# Patient Record
Sex: Female | Born: 1950 | Race: Black or African American | Hispanic: No | Marital: Single | Smoking: Former smoker
Health system: Southern US, Community
[De-identification: ages and names within clinical notes are randomized; demographics above are authoritative.]

## PROBLEM LIST (undated history)

## (undated) DIAGNOSIS — E039 Hypothyroidism, unspecified: Secondary | ICD-10-CM

## (undated) DIAGNOSIS — K8500 Idiopathic acute pancreatitis without necrosis or infection: Principal | ICD-10-CM

## (undated) DIAGNOSIS — Z720 Tobacco use: Secondary | ICD-10-CM

## (undated) DIAGNOSIS — I10 Essential (primary) hypertension: Secondary | ICD-10-CM

## (undated) DIAGNOSIS — F0390 Unspecified dementia without behavioral disturbance: Secondary | ICD-10-CM

## (undated) DIAGNOSIS — M6530 Trigger finger, unspecified finger: Secondary | ICD-10-CM

## (undated) DIAGNOSIS — K859 Acute pancreatitis without necrosis or infection, unspecified: Secondary | ICD-10-CM

## (undated) DIAGNOSIS — R413 Other amnesia: Secondary | ICD-10-CM

## (undated) DIAGNOSIS — I358 Other nonrheumatic aortic valve disorders: Secondary | ICD-10-CM

## (undated) DIAGNOSIS — J449 Chronic obstructive pulmonary disease, unspecified: Secondary | ICD-10-CM

## (undated) HISTORY — DX: Acute pancreatitis without necrosis or infection, unspecified: K85.90

## (undated) HISTORY — DX: Idiopathic acute pancreatitis without necrosis or infection: K85.00

## (undated) HISTORY — PX: BACK SURGERY: SHX140

---
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
## 1999-02-22 ENCOUNTER — Encounter: Admission: RE | Admit: 1999-02-22 | Discharge: 1999-02-22 | Payer: Self-pay | Admitting: Internal Medicine

## 1999-02-22 ENCOUNTER — Encounter: Payer: Self-pay | Admitting: Internal Medicine

## 1999-08-22 ENCOUNTER — Encounter: Admission: RE | Admit: 1999-08-22 | Discharge: 1999-11-20 | Payer: Self-pay | Admitting: Internal Medicine

## 1999-09-05 ENCOUNTER — Encounter: Admission: RE | Admit: 1999-09-05 | Discharge: 1999-09-05 | Payer: Self-pay | Admitting: *Deleted

## 1999-09-05 ENCOUNTER — Encounter: Payer: Self-pay | Admitting: *Deleted

## 1999-09-18 ENCOUNTER — Ambulatory Visit (HOSPITAL_COMMUNITY): Admission: RE | Admit: 1999-09-18 | Discharge: 1999-09-18 | Payer: Self-pay | Admitting: *Deleted

## 1999-09-18 ENCOUNTER — Encounter: Payer: Self-pay | Admitting: *Deleted

## 2000-02-28 ENCOUNTER — Encounter: Payer: Self-pay | Admitting: Internal Medicine

## 2000-02-28 ENCOUNTER — Encounter: Admission: RE | Admit: 2000-02-28 | Discharge: 2000-02-28 | Payer: Self-pay | Admitting: Internal Medicine

## 2000-11-09 ENCOUNTER — Encounter: Admission: RE | Admit: 2000-11-09 | Discharge: 2000-11-09 | Payer: Self-pay | Admitting: Internal Medicine

## 2000-11-09 ENCOUNTER — Encounter: Payer: Self-pay | Admitting: Internal Medicine

## 2001-01-27 HISTORY — PX: CHOLECYSTECTOMY: SHX55

## 2001-08-31 ENCOUNTER — Encounter: Admission: RE | Admit: 2001-08-31 | Discharge: 2001-08-31 | Payer: Self-pay | Admitting: Internal Medicine

## 2001-08-31 ENCOUNTER — Other Ambulatory Visit: Admission: RE | Admit: 2001-08-31 | Discharge: 2001-08-31 | Payer: Self-pay | Admitting: Obstetrics and Gynecology

## 2001-08-31 ENCOUNTER — Encounter: Payer: Self-pay | Admitting: Internal Medicine

## 2001-12-09 ENCOUNTER — Encounter: Admission: RE | Admit: 2001-12-09 | Discharge: 2001-12-09 | Payer: Self-pay | Admitting: Internal Medicine

## 2001-12-09 ENCOUNTER — Encounter: Payer: Self-pay | Admitting: Internal Medicine

## 2001-12-13 ENCOUNTER — Encounter: Payer: Self-pay | Admitting: Internal Medicine

## 2001-12-13 ENCOUNTER — Encounter: Admission: RE | Admit: 2001-12-13 | Discharge: 2001-12-13 | Payer: Self-pay | Admitting: Internal Medicine

## 2002-01-10 ENCOUNTER — Encounter: Payer: Self-pay | Admitting: General Surgery

## 2002-01-13 ENCOUNTER — Ambulatory Visit (HOSPITAL_COMMUNITY): Admission: RE | Admit: 2002-01-13 | Discharge: 2002-01-14 | Payer: Self-pay | Admitting: General Surgery

## 2002-01-13 ENCOUNTER — Encounter (INDEPENDENT_AMBULATORY_CARE_PROVIDER_SITE_OTHER): Payer: Self-pay | Admitting: *Deleted

## 2002-01-13 ENCOUNTER — Encounter: Payer: Self-pay | Admitting: General Surgery

## 2003-04-11 ENCOUNTER — Encounter: Admission: RE | Admit: 2003-04-11 | Discharge: 2003-04-11 | Payer: Self-pay | Admitting: Internal Medicine

## 2003-04-11 ENCOUNTER — Other Ambulatory Visit: Admission: RE | Admit: 2003-04-11 | Discharge: 2003-04-11 | Payer: Self-pay | Admitting: Obstetrics and Gynecology

## 2003-07-13 ENCOUNTER — Encounter: Admission: RE | Admit: 2003-07-13 | Discharge: 2003-07-13 | Payer: Self-pay | Admitting: Internal Medicine

## 2004-01-28 HISTORY — PX: SPINE SURGERY: SHX786

## 2005-01-14 ENCOUNTER — Inpatient Hospital Stay (HOSPITAL_COMMUNITY): Admission: AD | Admit: 2005-01-14 | Discharge: 2005-01-16 | Payer: Self-pay | Admitting: Neurosurgery

## 2005-05-21 ENCOUNTER — Encounter: Admission: RE | Admit: 2005-05-21 | Discharge: 2005-05-21 | Payer: Self-pay | Admitting: Obstetrics and Gynecology

## 2005-07-04 ENCOUNTER — Ambulatory Visit (HOSPITAL_COMMUNITY): Admission: RE | Admit: 2005-07-04 | Discharge: 2005-07-04 | Payer: Self-pay | Admitting: Gastroenterology

## 2005-07-11 ENCOUNTER — Encounter: Admission: RE | Admit: 2005-07-11 | Discharge: 2005-07-11 | Payer: Self-pay | Admitting: Neurosurgery

## 2005-08-01 ENCOUNTER — Encounter: Admission: RE | Admit: 2005-08-01 | Discharge: 2005-08-01 | Payer: Self-pay | Admitting: Neurosurgery

## 2005-09-26 ENCOUNTER — Encounter: Admission: RE | Admit: 2005-09-26 | Discharge: 2005-09-26 | Payer: Self-pay | Admitting: Neurosurgery

## 2006-09-07 ENCOUNTER — Encounter: Admission: RE | Admit: 2006-09-07 | Discharge: 2006-09-07 | Payer: Self-pay | Admitting: Neurosurgery

## 2006-09-16 ENCOUNTER — Encounter: Admission: RE | Admit: 2006-09-16 | Discharge: 2006-09-16 | Payer: Self-pay | Admitting: Internal Medicine

## 2006-11-06 ENCOUNTER — Emergency Department (HOSPITAL_COMMUNITY): Admission: EM | Admit: 2006-11-06 | Discharge: 2006-11-06 | Payer: Self-pay | Admitting: Emergency Medicine

## 2007-11-05 ENCOUNTER — Encounter: Admission: RE | Admit: 2007-11-05 | Discharge: 2007-11-05 | Payer: Self-pay | Admitting: Internal Medicine

## 2008-01-28 DIAGNOSIS — K859 Acute pancreatitis without necrosis or infection, unspecified: Secondary | ICD-10-CM

## 2008-01-28 HISTORY — DX: Acute pancreatitis without necrosis or infection, unspecified: K85.90

## 2008-08-10 ENCOUNTER — Other Ambulatory Visit (HOSPITAL_COMMUNITY): Admission: RE | Admit: 2008-08-10 | Discharge: 2008-09-01 | Payer: Self-pay | Admitting: Psychiatry

## 2008-08-11 ENCOUNTER — Ambulatory Visit: Payer: Self-pay | Admitting: Psychiatry

## 2008-09-12 ENCOUNTER — Emergency Department (HOSPITAL_COMMUNITY): Admission: EM | Admit: 2008-09-12 | Discharge: 2008-09-12 | Payer: Self-pay | Admitting: Emergency Medicine

## 2008-09-12 ENCOUNTER — Ambulatory Visit: Payer: Self-pay | Admitting: *Deleted

## 2008-09-13 ENCOUNTER — Inpatient Hospital Stay (HOSPITAL_COMMUNITY): Admission: RE | Admit: 2008-09-13 | Discharge: 2008-09-13 | Payer: Self-pay | Admitting: *Deleted

## 2008-11-03 ENCOUNTER — Encounter: Admission: RE | Admit: 2008-11-03 | Discharge: 2008-11-03 | Payer: Self-pay | Admitting: Neurosurgery

## 2009-01-30 ENCOUNTER — Encounter: Admission: RE | Admit: 2009-01-30 | Discharge: 2009-01-30 | Payer: Self-pay | Admitting: Internal Medicine

## 2009-03-03 ENCOUNTER — Inpatient Hospital Stay (HOSPITAL_COMMUNITY): Admission: EM | Admit: 2009-03-03 | Discharge: 2009-03-06 | Payer: Self-pay | Admitting: Emergency Medicine

## 2009-08-28 ENCOUNTER — Encounter: Admission: RE | Admit: 2009-08-28 | Discharge: 2009-08-28 | Payer: Self-pay | Admitting: Internal Medicine

## 2010-02-16 ENCOUNTER — Encounter: Payer: Self-pay | Admitting: Internal Medicine

## 2010-02-27 ENCOUNTER — Encounter: Payer: Self-pay | Admitting: Neurosurgery

## 2010-03-04 ENCOUNTER — Other Ambulatory Visit: Payer: Self-pay | Admitting: Neurosurgery

## 2010-03-04 DIAGNOSIS — M47816 Spondylosis without myelopathy or radiculopathy, lumbar region: Secondary | ICD-10-CM

## 2010-03-05 ENCOUNTER — Ambulatory Visit
Admission: RE | Admit: 2010-03-05 | Discharge: 2010-03-05 | Disposition: A | Discharge status: Home / Self Care | Payer: BC Managed Care – HMO | Source: Ambulatory Visit | Attending: Neurosurgery | Admitting: Neurosurgery

## 2010-03-05 DIAGNOSIS — M47816 Spondylosis without myelopathy or radiculopathy, lumbar region: Secondary | ICD-10-CM

## 2010-03-14 ENCOUNTER — Telehealth: Payer: Self-pay | Admitting: Neurosurgery

## 2010-04-17 LAB — BASIC METABOLIC PANEL
BUN: 7 mg/dL (ref 6–23)
BUN: 7 mg/dL (ref 6–23)
CO2: 22 mEq/L (ref 19–32)
CO2: 22 mEq/L (ref 19–32)
Calcium: 8.8 mg/dL (ref 8.4–10.5)
Calcium: 9.1 mg/dL (ref 8.4–10.5)
Chloride: 104 mEq/L (ref 96–112)
Chloride: 105 mEq/L (ref 96–112)
Creatinine, Ser: 0.75 mg/dL (ref 0.4–1.2)
Creatinine, Ser: 0.77 mg/dL (ref 0.4–1.2)
GFR calc Af Amer: >60 mL/min (ref >60)
Glucose, Bld: 100 mg/dL — ABNORMAL HIGH (ref 70–99)

## 2010-04-17 LAB — URINALYSIS, ROUTINE W REFLEX MICROSCOPIC
Glucose, UA: NEGATIVE mg/dL
Urobilinogen, UA: 0.2 mg/dL (ref 0.0–1.0)
pH: 6 (ref 5.0–8.0)

## 2010-04-17 LAB — COMPREHENSIVE METABOLIC PANEL
Alkaline Phosphatase: 111 U/L (ref 39–117)
BUN: 18 mg/dL (ref 6–23)
BUN: 9 mg/dL (ref 6–23)
CO2: 24 mEq/L (ref 19–32)
CO2: 25 mEq/L (ref 19–32)
Calcium: 9.3 mg/dL (ref 8.4–10.5)
Chloride: 105 mEq/L (ref 96–112)
Creatinine, Ser: 0.61 mg/dL (ref 0.4–1.2)
Creatinine, Ser: 0.8 mg/dL (ref 0.4–1.2)
GFR calc non Af Amer: >60 mL/min (ref >60)
Glucose, Bld: 119 mg/dL — ABNORMAL HIGH (ref 70–99)
Potassium: 3.2 mEq/L — ABNORMAL LOW (ref 3.5–5.1)
Potassium: 3.7 mEq/L (ref 3.5–5.1)
Sodium: 137 mEq/L (ref 135–145)
Total Bilirubin: 0.8 mg/dL (ref 0.3–1.2)

## 2010-04-17 LAB — CBC
HCT: 39.1 % (ref 36.0–46.0)
Hemoglobin: 13.1 g/dL (ref 12.0–15.0)
MCHC: 33.7 g/dL (ref 30.0–36.0)
MCHC: 34 g/dL (ref 30.0–36.0)
MCV: 90.7 fL (ref 78.0–100.0)
MCV: 91.3 fL (ref 78.0–100.0)
MCV: 91.9 fL (ref 78.0–100.0)
MCV: 92.5 fL (ref 78.0–100.0)
Platelets: 155 10*3/uL (ref 150–400)
Platelets: 159 10*3/uL (ref 150–400)
Platelets: 169 10*3/uL (ref 150–400)
RBC: 4.26 MIL/uL (ref 3.87–5.11)
RBC: 4.4 MIL/uL (ref 3.87–5.11)
RDW: 14.1 % (ref 11.5–15.5)
WBC: 5.9 10*3/uL (ref 4.0–10.5)
WBC: 7.3 10*3/uL (ref 4.0–10.5)

## 2010-04-17 LAB — GLUCOSE, CAPILLARY
Glucose-Capillary: 102 mg/dL — ABNORMAL HIGH (ref 70–99)
Glucose-Capillary: 104 mg/dL — ABNORMAL HIGH (ref 70–99)
Glucose-Capillary: 124 mg/dL — ABNORMAL HIGH (ref 70–99)
Glucose-Capillary: 94 mg/dL (ref 70–99)
Glucose-Capillary: 99 mg/dL (ref 70–99)

## 2010-04-17 LAB — LIPID PANEL
Cholesterol: 143 mg/dL (ref 0–200)
LDL Cholesterol: 76 mg/dL (ref 0–99)
Triglycerides: 79 mg/dL (ref <150)
VLDL: 16 mg/dL (ref 0–40)

## 2010-04-17 LAB — DIFFERENTIAL
Basophils Relative: 0 % (ref 0–1)
Lymphocytes Relative: 14 % (ref 12–46)
Monocytes Absolute: 0.6 10*3/uL (ref 0.1–1.0)
Monocytes Relative: 7 % (ref 3–12)
Neutro Abs: 6 10*3/uL (ref 1.7–7.7)

## 2010-04-17 LAB — LIPASE, BLOOD
Lipase: 41 U/L (ref 11–59)
Lipase: 47 U/L (ref 11–59)

## 2010-04-17 LAB — HEMOGLOBIN A1C: Mean Plasma Glucose: 137 mg/dL

## 2010-05-04 LAB — URINALYSIS, ROUTINE W REFLEX MICROSCOPIC
Glucose, UA: NEGATIVE mg/dL
Hgb urine dipstick: NEGATIVE
Specific Gravity, Urine: 1.023 (ref 1.005–1.030)
Urobilinogen, UA: 1 mg/dL (ref 0.0–1.0)
pH: 6 (ref 5.0–8.0)

## 2010-05-04 LAB — DIFFERENTIAL
Basophils Absolute: 0.1 10*3/uL (ref 0.0–0.1)
Basophils Relative: 1 % (ref 0–1)
Lymphocytes Relative: 23 % (ref 12–46)
Neutro Abs: 3.9 10*3/uL (ref 1.7–7.7)
Neutrophils Relative %: 64 % (ref 43–77)

## 2010-05-04 LAB — BASIC METABOLIC PANEL
CO2: 27 mEq/L (ref 19–32)
Calcium: 9.1 mg/dL (ref 8.4–10.5)
Creatinine, Ser: 0.97 mg/dL (ref 0.4–1.2)
GFR calc Af Amer: >60 mL/min (ref >60)
GFR calc non Af Amer: 59 mL/min — ABNORMAL LOW (ref >60)
Glucose, Bld: 101 mg/dL — ABNORMAL HIGH (ref 70–99)
Sodium: 138 mEq/L (ref 135–145)

## 2010-05-04 LAB — RAPID URINE DRUG SCREEN, HOSP PERFORMED
Amphetamines: NOT DETECTED
Barbiturates: NOT DETECTED
Cocaine: NOT DETECTED
Opiates: POSITIVE — AB

## 2010-05-04 LAB — CBC
Hemoglobin: 13.3 g/dL (ref 12.0–15.0)
MCHC: 33.5 g/dL (ref 30.0–36.0)
RBC: 4.32 MIL/uL (ref 3.87–5.11)
RDW: 14.9 % (ref 11.5–15.5)

## 2010-05-04 LAB — GLUCOSE, CAPILLARY

## 2010-05-04 LAB — POCT PREGNANCY, URINE: Preg Test, Ur: NEGATIVE

## 2010-06-14 NOTE — Op Note (Signed)
NAMEMarland Reid  AVIONNA, BOWER                          ACCOUNT NO.:  [id number]   MEDICAL RECORD NO.:  [id number]                   PATIENT TYPE:  OIB   LOCATION:  5733                                 FACILITY:  [facility]   PHYSICIAN:  Angelia Mould. Derrell Lolling, M.D.             DATE OF BIRTH:  Nov 28, 1950   DATE OF PROCEDURE:  01/13/2002  DATE OF DISCHARGE:                                 OPERATIVE REPORT   PREOPERATIVE DIAGNOSES:  1. Chronic cholecystitis with cholelithiasis.  2. Right ovarian cyst.   POSTOPERATIVE DIAGNOSES:  1. Chronic cholecystitis with cholelithiasis.  2. Right ovarian cyst, resolving.   PROCEDURES:  1. Laparoscopic cholecystectomy with intraoperative cholangiogram.  2. Photography of right ovary and fallopian tube.   SURGEON:  Angelia Mould. Derrell Lolling, M.D.   ASSISTANT:  Jimmye Norman, M.D.   OPERATIVE INDICATION:  This is a 60 year old black female who has had  intermittent episodes of epigastric pain for many years and has known that  she has gallstones.  For the past six to eight weeks she has been having  almost daily postprandial epigastric pain radiating to the back.  Initial  lab work showed mild elevation of her alkaline phosphatase, but repeat lab  work showed that all of her liver function tests were normal.  An ultrasound  showed at least one gallstones and a contracted gallbladder.  A CT scan of  the abdomen suggested an area of mild focal pancreatitis in the pancreatic  head and a 4.5 cm right ovarian cyst.  She was brought to the operating room  electively.   OPERATIVE FINDINGS:  The gallbladder was chronically inflamed.  The anatomy  of the cystic duct, cystic artery, and common bile duct were conventional.  The intraoperative cholangiogram was normal, showing normal intrahepatic and  extrahepatic bile ducts, no fixed filling defects, prompt flow of contrast  into the duodenum.  There were a couple of air bubbles that we were able to  elongate and flush through.   There were no filling defects suggesting stone.  The liver looked normal.  The right ovary and fallopian tube were visualized  and photographed, and there was a minimal cyst on the right ovary,  suggesting that the large cyst was resolving.  No other abnormalities were  noted of the stomach, duodenum, small intestine, large intestine, or  peritoneal surfaces.   DESCRIPTION OF PROCEDURE:  Following the induction of general endotracheal  anesthesia, the patient's abdomen was prepped and draped in a sterile  fashion.  Marcaine 0.5% with epinephrine was used as a local infiltration  anesthetic.  A vertically-oriented incision was made in the superior rim of  the umbilicus.  The fascia was incised in the midline and the abdominal  cavity entered under direct vision.  A 10 mm Hasson trocar was inserted and  secured with a pursestring suture of 0 Vicryl.  Pneumoperitoneum was  created.  The video camera was  inserted with visualization and findings as  described above.  A 10 mm trocar was placed in the subxiphoid region and two  5 mm trocars placed in the right midabdomen.   The patient was placed in Trendelenburg position.  The bowel was retracted  out of the way and we visualized the uterus, which looked normal, and then  visualized the right fallopian tube and right ovary and took photographs of  that.  The ovary looked essentially normal.  There was what appeared to be a  very tiny cyst remaining in the ovary.   The patient was then positioned in reverse Trendelenburg position and to the  left.  The gallbladder was elevated.  Adhesions were taken down.  We  dissected out the cystic duct and the cystic artery.  The cystic artery was  secured with metal clips and divided.  A large window was created behind the  cystic duct.  The cystic duct was secured with a metal clip close to the  gallbladder.  A cholangiogram catheter was inserted into the cystic duct.  A  cholangiogram was obtained using  the C-arm.  This showed normal intrahepatic  and extrahepatic bile ducts, prompt flow of contrast into the duodenum, and  no fixed filling defects.  A couple of air bubbles were noted, but they were  flushed through.   The cholangiogram catheter was removed.  The cystic duct was secured with  metal clips and divided.  The gallbladder was dissected from its bed with  electrocautery and removed through the umbilical port.  The operative field  was copiously irrigated.  The irrigation fluid was completely clear.  There  was no bleeding and no bile leak whatsoever at the completion of the case.  The trocars were removed under direct vision, and there was no bleeding from  the trocar sites.  The pneumoperitoneum was released.  The fascia at the  umbilicus was closed with 0 Vicryl sutures.  The skin incision closed with  subcuticular sutures of 4-0 Vicryl and Steri-Strips.  Clean bandages were  placed and the patient taken to the recovery room in stable condition.  Estimated blood loss was about 10 cc.  Complications:  None.  Sponge,  needle, and instrument counts were correct.                                               Angelia Mould. Derrell Lolling, M.D.    HMI/MEDQ  D:  01/13/2002  T:  01/14/2002  Job:  [id number]   cc:   Thora Lance, M.D.  [address]  [place]  [place] [address]  Fax: [phone]   Eliberto Ivory. Rosalio Macadamia, M.D.  [address]  [address]  [place]  [place] [address]  Fax: [phone]

## 2010-06-14 NOTE — Op Note (Signed)
NAMESHELANDA, [name] NO.:  [id number]   MEDICAL RECORD NO.:  [id number]          PATIENT TYPE:  AMB   LOCATION:  SDS                          FACILITY:  [facility]   PHYSICIAN:  Payton Doughty, M.D.      DATE OF BIRTH:  08-25-1950   DATE OF PROCEDURE:  01/14/2005  DATE OF DISCHARGE:                                 OPERATIVE REPORT   PREOPERATIVE DIAGNOSIS:  Spondylosis, L3-4, L4-5.   POSTOPERATIVE DIAGNOSIS:  Spondylosis, L3-4, L4-5.   OPERATION:  L3-4, L4-5 laminotomy and foraminotomy, bilateral.   SURGEON:  Payton Doughty, M.D.   NURSE ASSISTANT:  Shamrock.   DOCTOR ASSISTANT:  Hewitt Shorts, M.D.   ANESTHESIA:  General endotracheal.   PREPARATION:  Sterile Betadine prep and scrub with alcohol wipe.   COMPLICATIONS:  None.   BODY OF TEXT:  This is a 60 year old girl with spondylosis at L3-4 and L4-5.  Taken to the operating room and smoothly anesthetized and intubated, placed  prone on the operating room table.  Following shave, prep and drape in the  usual sterile fashion, the skin was infiltrated with 1% lidocaine and  1:400,000 epinephrine.  The skin was incised from the top of L3 to mid-L5  and the lamina of L3, L4 and the top of L5 were exposed bilaterally in the  subperiosteal plane.  Intraoperative x-ray confirmed correctness of the  level.  Laminotomy and foraminotomy were carried out bilaterally using the  high-speed drill.  This was carried to the top of the ligamentum flavum,  which was removed in retrograde fashion, and this allowed decompression of  the nerve roots.  There was significantly more spondylitic change on the  left than on the right.  Following complete decompression, the wound was  irrigated and hemostasis assured.  The laminotomies and foraminotomies were  covered with Depo-Medrol-soaked fat.  Successive layers of 0 Vicryl, 2-0  Vicryl and 3-0 nylon were used to close.  A Betadine and Telfa dressing was  applied and made  occlusive with OpSite and the patient returned to the  recovery room in good condition.           ______________________________  Payton Doughty, M.D.     MWR/MEDQ  D:  01/14/2005  T:  01/16/2005  Job:  [id number]

## 2010-06-14 NOTE — Discharge Summary (Signed)
NAMETATJANA, TURCOTT NO.:  [id number]   MEDICAL RECORD NO.:  [id number]           PATIENT TYPE:   LOCATION:                                 FACILITY:   PHYSICIAN:  Jasmine Pang, M.D. DATE OF BIRTH:  11/14/1950   DATE OF ADMISSION:  09/12/2008  DATE OF DISCHARGE:  09/13/2008                               DISCHARGE SUMMARY   IDENTIFICATION:  This is a 60 year old single African American female  from [place], who was admitted on a voluntary basis.   HISTORY OF PRESENT ILLNESS:  The patient states her mother died in 06/30/2007.  She was very close to her.  She has been grieving a lot.  Mother had  pancreatic cancer and died quickly (within 4 months).  She had been in  [facility], Dr. Electa Sniff started her on Zoloft.  She has also been going to  hospice for counseling.  She states she made some comments about wanting  to die; however, she denies she wants to die and would never commit  suicide.  She has supportive boyfriend, who feels that she is safe to go  home.  For further admission information, see psychiatric admission  assessment.   PHYSICAL FINDINGS:  The complete physical exam was done in the [facility] ED.   DIAGNOSTIC STUDIES:  BMET was within normal limits.  Urine pregnancy  test was negative.   HOSPITAL COURSE:  Upon admission, the patient was started on Zoloft 50  mg daily, and Wellbutrin XL 300 mg daily, Actos 30 mg daily, and  metformin 500 mg daily, and Zocor 20 mg daily, aspirin 81 mg daily,  Flexeril 10 mg p.o. q.8 h. p.r.n., and hydrocodone 10/325 mg 1-2 tablets  q.8 h. p.r.n. pain.  She was also started on Ambien 10 mg p.o. q.h.s.  p.r.n. insomnia, Xanax 0.5 mg one-half to one tablet p.o. t.i.d. p.r.n.  anxiety, and Allegra 180 mg p.o. daily p.r.n., and Flonase nasal spray 1  spray each nostril b.i.d. p.r.n.  In addition, she was started on  Abilify 2 mg p.o. now then daily.  In individual sessions as already  reported when I first met with her on  September 13, 2008, she denied she  wanted to die and would never commit suicide.  She did not want to stay  in the hospital.  She had support system out of the hospital and  promised she could be safe.  The counselor today called her boyfriend.  He does not believe the patient would harm herself and he has talked to  family and friends that feels the same.  He did state that the patient  is depressed over loss of her mother.  They were extremely close and she  has had a lot of difficulty accepting this loss.  He feels she is crying  out for help and does not know what to do.  He lives with her and can  make sure she is safe,  Safety plan was discussed with him.  He was  agreeable to picking her up for discharge.  The patient discussed  discomfort of being in the hospital setting and was very relieved to  hear she would be discharged.  Followup hospital counseling and other  possible support groups were arranged.  She will also continue at [facility] with Dr. Jamas Lav and her therapist, Almond Lint on  September 14, 2008, at 2 p.m.   DISCHARGE DIAGNOSES:  Axis I:  Major depressive disorder, recurrent,  severe, generalized anxiety disorder.  Axis II:  None.  Axis III:  Diabetes, hypertension, hyperlipidemia.  Axis IV:  Severe, problems with primary support group, loss of mother  recently, other psychosocial problems, burden of psychiatric illness,  burden of medical problems.  Axis V:  Global assessment of functioning was 50 at discharge.  GAF was  45 upon admission.  GAF highest past year was 70-75.   DISCHARGE PLANS:  There were no specific activity level or dietary  restrictions.   POSTHOSPITAL CARE PLANS:  The patient will return to Almond Lint at  [facility] on September 14, 2008, 2 p.m.  She will also return to  [facility] to see Dr. Jamas Lav for medication  management.   DISCHARGE MEDICATIONS:  1. Zoloft 50 mg daily.  2. Wellbutrin XL 300  mg daily.  3. Xanax 0.5 mg one-half to one tablet t.i.d. as needed.  4. Actos 30 mg daily.  5. Metformin 500 mg daily.  6. Zocor 20 mg daily.  7. Allegra as directed.  8. Flonase as directed.  9. Aspirin 81 mg daily.  10.Flexeril 10 mg q.8 h. as needed.  11.Hydrocodone as directed.  12.Maxzide as directed.  13.Abilify 2 mg p.o. daily.      Jasmine Pang, M.D.  Electronically Signed     BHS/MEDQ  D:  09/24/2008  T:  09/25/2008  Job:  [id number]

## 2010-06-14 NOTE — H&P (Signed)
[name], [name] NO.:  [id number]   MEDICAL RECORD NO.:  [id number]          PATIENT TYPE:  OIB   LOCATION:  3040                         FACILITY:  [facility]   PHYSICIAN:  Payton Doughty, M.D.      DATE OF BIRTH:  12-11-1950   DATE OF ADMISSION:  01/14/2005  DATE OF DISCHARGE:                                HISTORY & PHYSICAL   ADMITTING DIAGNOSIS:  Spondylosis L3-4 and L4-5.   SERVICE:  Neurosurgery.   This is a very nice 60 year old right-handed black lady who injured her back  moving furniture in 2004. She has had an injection about a year ago that  helped for a couple of days. She has pain every day in her low back, most of  the time down her left leg, sometimes it is in her right leg. She has been  steady in her hips. She has some degenerative change. The biggest problem is  in her back and her left leg. MR has been obtained that demonstrates  spondylosis at L3-4, L4-5, and L5-S1. It appears to be most stenotic at L3-4  and L4-5, and she is admitted now for decompression bilaterally at those  levels.   MEDICAL HISTORY:  Remarkable for adult-onset diabetes for which she takes  metformin 1000 mg twice a day and Avandia 4 mg a day. She is also on Maxzide  25 mg a day. Other medications:  Zocor 20 mg a day, Allegra, and Flonase.   ALLERGIES:  GENERIC MAXZIDE.   SURGICAL HISTORY:  Cholecystectomy in 2003.   FAMILY HISTORY:  Mom is 34, has lumbar spondylosis. Dad is not given.   SOCIAL HISTORY:  Smokes a half a pack of cigarettes a day, does not drink  alcohol, and works in Clinical biochemist for [employer].   REVIEW OF SYSTEMS:  Remarkable for glasses, hypertension,  hypercholesterolemia, leg weakness, back pain, leg pain, joint pain,  arthritis, and diabetes.   PHYSICAL EXAMINATION:  HEENT:  Within normal limits. She has good range of  motion of the neck.  CHEST:  Clear.  CARDIAC:  Regular rate and rhythm.  ABDOMEN:  Nontender with no  hepatosplenomegaly.  EXTREMITIES:  Without clubbing or cyanosis.  GENITOURINARY:  Deferred.  PERIPHERAL PULSES:  Good.  NEUROLOGIC:  She is awake, alert, and oriented. Cranial nerves are intact.  Motor exam shows 5/5 strength throughout the upper and lower extremities.  Sensory dysesthesia described in the left L4 and L5 distribution. Deep  tendon reflexes are 2 at the knees, 1 at the right ankle, absent at the  left. Straight leg raise is positive on the left.   MRI results have been reviewed above.   CLINICAL IMPRESSION:  Lumbar spondylosis with radicular claudication. The  plan is for bilateral laminotomy and foraminotomy at L3-4 and L4-5. She  understands this is not a fusion and it may not solve all her problems but  it offers her the best opportunity without restricting her range of motion.  The risks and benefits of this have been discussed with her and she wishes  to proceed.  ______________________________  Payton Doughty, M.D.     MWR/MEDQ  D:  01/14/2005  T:  01/15/2005  Job:  [id number]

## 2011-01-30 ENCOUNTER — Ambulatory Visit (HOSPITAL_COMMUNITY)
Admission: RE | Admit: 2011-01-30 | Discharge: 2011-01-30 | Disposition: A | Discharge status: Home / Self Care | Payer: Self-pay | Source: Ambulatory Visit | Attending: Internal Medicine | Admitting: Internal Medicine

## 2011-01-30 DIAGNOSIS — M7989 Other specified soft tissue disorders: Secondary | ICD-10-CM

## 2011-01-30 DIAGNOSIS — R609 Edema, unspecified: Secondary | ICD-10-CM

## 2011-01-30 NOTE — Progress Notes (Signed)
*  PRELIMINARY RESULTS* Left lower extremity venous duplex has been performed.   Left:  No evidence of DVT, superficial thrombosis, or Baker's cyst.   [name] 01/30/2011, 7:21 PM

## 2011-02-02 ENCOUNTER — Emergency Department (HOSPITAL_BASED_OUTPATIENT_CLINIC_OR_DEPARTMENT_OTHER)
Admission: EM | Admit: 2011-02-02 | Discharge: 2011-02-03 | Disposition: A | Discharge status: Home / Self Care | Payer: Self-pay | Attending: Emergency Medicine | Admitting: Emergency Medicine

## 2011-02-02 ENCOUNTER — Encounter: Payer: Self-pay | Admitting: *Deleted

## 2011-02-02 DIAGNOSIS — Z79899 Other long term (current) drug therapy: Secondary | ICD-10-CM | POA: Insufficient documentation

## 2011-02-02 DIAGNOSIS — F17200 Nicotine dependence, unspecified, uncomplicated: Secondary | ICD-10-CM | POA: Insufficient documentation

## 2011-02-02 DIAGNOSIS — R609 Edema, unspecified: Secondary | ICD-10-CM | POA: Insufficient documentation

## 2011-02-02 DIAGNOSIS — R600 Localized edema: Secondary | ICD-10-CM

## 2011-02-02 DIAGNOSIS — I10 Essential (primary) hypertension: Secondary | ICD-10-CM | POA: Insufficient documentation

## 2011-02-02 DIAGNOSIS — E119 Type 2 diabetes mellitus without complications: Secondary | ICD-10-CM | POA: Insufficient documentation

## 2011-02-02 DIAGNOSIS — M25579 Pain in unspecified ankle and joints of unspecified foot: Secondary | ICD-10-CM | POA: Insufficient documentation

## 2011-02-02 HISTORY — DX: Essential (primary) hypertension: I10

## 2011-02-02 LAB — DIFFERENTIAL
Lymphocytes Relative: 43 % (ref 12–46)
Lymphs Abs: 2.2 10*3/uL (ref 0.7–4.0)
Monocytes Relative: 11 % (ref 3–12)
Neutro Abs: 2.3 10*3/uL (ref 1.7–7.7)
Neutrophils Relative %: 44 % (ref 43–77)

## 2011-02-02 LAB — CBC
Hemoglobin: 13.5 g/dL (ref 12.0–15.0)
Platelets: 180 10*3/uL (ref 150–400)
RBC: 4.73 MIL/uL (ref 3.87–5.11)
WBC: 5.3 10*3/uL (ref 4.0–10.5)

## 2011-02-02 NOTE — ED Notes (Signed)
Pt states she startted having left ankle pain and swelling on Friday. Seen at [facility] and sent to the hospital to r/o blood clot "Negative". Sat "better", but today increased pain and swelling. Reddened.

## 2011-02-02 NOTE — ED Notes (Signed)
Blood drawn and sent to lab.

## 2011-02-02 NOTE — ED Provider Notes (Signed)
History   This chart was scribed for Hanley Seamen, MD by Charolett Bumpers . The patient was seen in room MH11/MH11 and the patient's care was started at 11:04pm.  CSN: [id number]  Arrival date & time 02/02/11  2150   First MD Initiated Contact with Patient 02/02/11 2304      Chief Complaint  Patient presents with  . Ankle Pain    (Consider location/radiation/quality/duration/timing/severity/associated sxs/prior treatment) HPI [name] is a 61 y.o. female who presents to the Emergency Department complaining of constant, moderate left ankle pain with associated left leg swelling that started 3 days ago. Patient was seen yesterday at [facility] and sent to the hospital to check for blot clot with a Doppler ultrasound which was negative. Patient said symptoms had gotten better yesterday, but have been worsening today. Patient denies fever, flu like symptoms, SOB, and chest pain. Patient states that otherwise she is not sick.     Past Medical History  Diagnosis Date  . Hypertension   . Diabetes mellitus     Past Surgical History  Procedure Date  . Cholecystectomy   . Back surgery     History reviewed. No pertinent family history.  History  Substance Use Topics  . Smoking status: Current Everyday Smoker  . Smokeless tobacco: Not on file  . Alcohol Use: No    OB History    Grav Para Term Preterm Abortions TAB SAB Ect Mult Living                  Review of Systems A complete 10 system review of systems was obtained and is otherwise negative except as noted in the HPI and PMH.   Allergies  Review of patient's allergies indicates not on file.  Home Medications   Current Outpatient Rx  Name Route Sig Dispense Refill  . ALPRAZOLAM 0.5 MG PO TABS Oral Take 0.5 mg by mouth 3 (three) times daily as needed. For anxiety     . AMLODIPINE BESYLATE 5 MG PO TABS Oral Take 5 mg by mouth daily.      . ASPIRIN EC 81 MG PO TBEC Oral Take 81 mg by mouth daily.      Marland Kitchen CALCIUM  CARBONATE-VITAMIN D 600-400 MG-UNIT PO TABS Oral Take 1 tablet by mouth daily.      . CHLORTHALIDONE 25 MG PO TABS Oral Take 25 mg by mouth daily.      . CYCLOBENZAPRINE HCL 10 MG PO TABS Oral Take 10 mg by mouth 2 (two) times daily.      Marland Kitchen HYDROCODONE-ACETAMINOPHEN 5-325 MG PO TABS Oral Take 2 tablets by mouth 2 (two) times daily.      Marland Kitchen LISINOPRIL 20 MG PO TABS Oral Take 20 mg by mouth daily.      Marland Kitchen METFORMIN HCL 500 MG PO TABS Oral Take 500 mg by mouth 2 (two) times daily with a meal.      . PRAVASTATIN SODIUM 40 MG PO TABS Oral Take 40 mg by mouth daily.        BP 123/83  Pulse 86  Temp(Src) 98.8 F (37.1 C) (Oral)  Resp 20  Ht 5\' 5"  (1.651 m)  Wt 207 lb (93.895 kg)  BMI 34.45 kg/m2  SpO2 100%  Physical Exam  Nursing note and vitals reviewed. Constitutional: She is oriented to person, place, and time. She appears well-developed and well-nourished. No distress.  HENT:  Head: Normocephalic and atraumatic.  Eyes: EOM are normal. Pupils are equal, round,  and reactive to light.  Neck: Neck supple. No tracheal deviation present.  Cardiovascular: Normal rate, regular rhythm and normal heart sounds.  Exam reveals no gallop and no friction rub.   No murmur heard. Pulmonary/Chest: Effort normal and breath sounds normal. No respiratory distress. She has no wheezes. She has no rales.  Abdominal: Soft. She exhibits no distension.  Musculoskeletal: Normal range of motion.       Left leg has edema with erythema and warmth about the ankle. Tenderness to soft tissue. No bony or joint tenderness.   Neurological: She is alert and oriented to person, place, and time. No sensory deficit.  Skin: Skin is warm and dry.  Psychiatric: She has a normal mood and affect. Her behavior is normal.    ED Course  Procedures (including critical care time)  DIAGNOSTIC STUDIES: Oxygen Saturation is 100% on room air, normal by my interpretation.    COORDINATION OF CARE:  11:10pm: Performed an ultrasound  to left leg.     MDM   Nursing notes and vitals signs, including pulse oximetry, reviewed.  Summary of this visit's results, reviewed by myself:  Labs:  Results for orders placed during the hospital encounter of 02/02/11  D-DIMER, QUANTITATIVE      Component Value Range   D-Dimer, Quant 0.24  0.00 - 0.48 (ug/mL-FEU)  CBC      Component Value Range   WBC 5.3  4.0 - 10.5 (K/uL)   RBC 4.73  3.87 - 5.11 (MIL/uL)   Hemoglobin 13.5  12.0 - 15.0 (g/dL)   HCT 11.9  14.7 - 82.9 (%)   MCV 85.2  78.0 - 100.0 (fL)   MCH 28.5  26.0 - 34.0 (pg)   MCHC 33.5  30.0 - 36.0 (g/dL)   RDW 56.2  13.0 - 86.5 (%)   Platelets 180  150 - 400 (K/uL)  DIFFERENTIAL      Component Value Range   Neutrophils Relative 44  43 - 77 (%)   Neutro Abs 2.3  1.7 - 7.7 (K/uL)   Lymphocytes Relative 43  12 - 46 (%)   Lymphs Abs 2.2  0.7 - 4.0 (K/uL)   Monocytes Relative 11  3 - 12 (%)   Monocytes Absolute 0.6  0.1 - 1.0 (K/uL)   Eosinophils Relative 3  0 - 5 (%)   Eosinophils Absolute 0.1  0.0 - 0.7 (K/uL)   Basophils Relative 0  0 - 1 (%)   Basophils Absolute 0.0  0.0 - 0.1 (K/uL)      I personally performed the services described in this documentation, which was scribed in my presence.  The recorded information has been reviewed and considered.  12:27 AM We'll treat for both DVT and cellulitis at this time. The significant edema and lack of severe erythema or warmth makes the diagnosis of cellulitis indefinite period will also obtain a repeat Doppler ultrasound as the first one may of been a false negative.       Hanley Seamen, MD 02/03/11 0030

## 2011-02-03 ENCOUNTER — Other Ambulatory Visit (HOSPITAL_BASED_OUTPATIENT_CLINIC_OR_DEPARTMENT_OTHER): Payer: Self-pay

## 2011-02-03 ENCOUNTER — Ambulatory Visit (HOSPITAL_BASED_OUTPATIENT_CLINIC_OR_DEPARTMENT_OTHER)
Admission: RE | Admit: 2011-02-03 | Discharge: 2011-02-03 | Disposition: A | Discharge status: Home / Self Care | Payer: Self-pay | Source: Ambulatory Visit | Attending: Emergency Medicine | Admitting: Emergency Medicine

## 2011-02-03 DIAGNOSIS — M79609 Pain in unspecified limb: Secondary | ICD-10-CM | POA: Insufficient documentation

## 2011-02-03 DIAGNOSIS — M7989 Other specified soft tissue disorders: Secondary | ICD-10-CM | POA: Insufficient documentation

## 2011-02-03 MED ORDER — CEPHALEXIN 500 MG PO CAPS: 500.0000 mg | ORAL_CAPSULE | Freq: Four times a day (QID) | ORAL | Status: AC

## 2011-02-03 MED ORDER — CEPHALEXIN 500 MG PO CAPS
500.0000 mg | ORAL_CAPSULE | Freq: Four times a day (QID) | ORAL | Status: DC
Start: 2011-02-03 — End: 2011-02-03

## 2011-02-03 MED ORDER — ENOXAPARIN SODIUM 100 MG/ML ~~LOC~~ SOLN
SUBCUTANEOUS | Status: AC
  Filled 2011-02-03: qty 1

## 2011-02-03 MED ORDER — ENOXAPARIN SODIUM 40 MG/0.4ML ~~LOC~~ SOLN
1.0000 mg/kg | Freq: Once | SUBCUTANEOUS | Status: AC
  Administered 2011-02-03: 94 mg via SUBCUTANEOUS
  Filled 2011-02-03: qty 0.4

## 2011-02-03 MED ORDER — CEPHALEXIN 250 MG PO CAPS
500.0000 mg | ORAL_CAPSULE | Freq: Once | ORAL | Status: AC
  Administered 2011-02-03: 500 mg via ORAL
  Filled 2011-02-03: qty 2

## 2011-02-03 NOTE — ED Notes (Signed)
rx x 1 given for keflex- verbalizes understanding to f/u with PCP and come back tomorrow for ultrasound

## 2011-09-10 ENCOUNTER — Ambulatory Visit (INDEPENDENT_AMBULATORY_CARE_PROVIDER_SITE_OTHER): Payer: Self-pay | Admitting: Family Medicine

## 2011-09-10 ENCOUNTER — Encounter: Payer: Self-pay | Admitting: Family Medicine

## 2011-09-10 VITALS — BP 135/86 | HR 85 | Ht 65.0 in | Wt 162.0 lb

## 2011-09-10 DIAGNOSIS — I10 Essential (primary) hypertension: Secondary | ICD-10-CM

## 2011-09-10 DIAGNOSIS — E785 Hyperlipidemia, unspecified: Secondary | ICD-10-CM

## 2011-09-10 DIAGNOSIS — M549 Dorsalgia, unspecified: Secondary | ICD-10-CM

## 2011-09-10 DIAGNOSIS — F329 Major depressive disorder, single episode, unspecified: Secondary | ICD-10-CM

## 2011-09-10 DIAGNOSIS — E1149 Type 2 diabetes mellitus with other diabetic neurological complication: Secondary | ICD-10-CM | POA: Insufficient documentation

## 2011-09-10 DIAGNOSIS — G8929 Other chronic pain: Secondary | ICD-10-CM

## 2011-09-10 DIAGNOSIS — E119 Type 2 diabetes mellitus without complications: Secondary | ICD-10-CM

## 2011-09-10 MED ORDER — SERTRALINE HCL 100 MG PO TABS: 200.0000 mg | ORAL_TABLET | Freq: Every day | ORAL | Status: DC

## 2011-09-10 NOTE — Progress Notes (Signed)
  Subjective:    Patient ID: [name], female    DOB: Oct 05, 1950, 61 y.o.   MRN: [id number]  HPI  61 year old female Presents for new patient visit today. She has a history of chronic back pain, anxiety, depression, hyperlipidemia, hypertension, and hyperglycemia.Her previous PCP is Dr. Kirby Funk. Her psychiatrist is Dr. Jamas Lav and tried psychiatric. Her neurosurgeon who performed back surgery with Dr. Trey Sailors. Patient states today that her greatest concern about her health as her depression. Has been managed by Dr. Waverly Ferrari in the past, but Dr. Waverly Ferrari is leaving her psychiatric practice. The patient is currently taking sertraline 100 mg a day, which she has been taking for Several years. She says that it is not effective. She denies any period in which it was effective in controlling her depressive symptoms. Dr. Raquel James also prescribed Seroquel XR 150 mg each bedtime, but the patient has not taken it secondary to concerns about side effects.The patient states that her depression causes memory loss and decreased appetite. The decreased appetite has resulted in an 80 pound weight loss since 2007/07/16. Her depression started in 2007/07/16 with the death of her mother. Since that time she feels her depression is worsening, in part this is due to losing her job with [employer] in Jul 15, 2009.Since that time she has been unemployed. She has no living siblings or children. She states that her best friend lives in [place] for his name is Cecille Rubin.The patient denies any suicidal ideations or history of suicide attempts. However she was hospitalized in 07/16/2007 showed after the death of her mother for treatment of depression.   Review of Systems  Constitutional: Positive for unexpected weight change.  Musculoskeletal: Positive for back pain.  Psychiatric/Behavioral: Positive for decreased concentration.  All other systems reviewed and are negative.   Past medical history: reviewed past surgeries,  illnesses, and medications and documented in the chart Family medical history: Pancreatic cancer her mother, diabetes in her mother, alcohol abuse in her father Social: the patient is single, unemployed, and lives alone in [place]. 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     Objective:   Physical Exam  Constitutional: She appears well-developed and well-nourished. She appears distressed.  HENT:  Head: Normocephalic and atraumatic.  Right Ear: External ear normal.  Left Ear: External ear normal.  Mouth/Throat: Oropharyngeal exudate present.  Eyes: Conjunctivae and EOM are normal. Pupils are equal, round, and reactive to light.  Skin: She is not diaphoretic.  Psychiatric: Thought content normal. Her affect is blunt. Her speech is delayed. She is slowed. Thought content is not paranoid and not delusional. She expresses no homicidal and no suicidal ideation. She expresses no suicidal plans and no homicidal plans.       Crying throughout the entire exam    BP 135/86  Pulse 85  Ht 5\' 5"  (1.651 m)  Wt 162 lb (73.483 kg)  BMI 26.96 kg/m2  PHQ9 - 24  Greater than 50% of the time spent counseling the patient about depression and treatment options     Assessment & Plan:  61 year old female with uncontrolled major depressive disorder.

## 2011-09-10 NOTE — Assessment & Plan Note (Signed)
This is very poorly controlled. Therefore we will increase her sertraline to 200 mg daily, after using 150 mg daily for one week. We'll also obtain records from her psychiatrist Dr. Raquel James. She'll follow up in 4 weeks or sooner as needed.

## 2011-09-10 NOTE — Patient Instructions (Addendum)
Dear Mrs. Coppinger,   Thank you for coming to clinic today. Please read below regarding the issues that we discussed.   Depression - I think we can definitely improve your treatment by increasing the dosage of sertraline. If this does not serve you well, then we can try the seroquel or another option. I look forward to working through this with you.   Your medications have been sent to the [facility] on [address].   Please follow up in clinic in 4 weeks . Please call earlier if you have any questions or concerns.   Sincerely,   Dr. Clinton Sawyer

## 2011-09-16 ENCOUNTER — Inpatient Hospital Stay (HOSPITAL_COMMUNITY)
Admission: EM | Admit: 2011-09-16 | Discharge: 2011-09-17 | DRG: 392 | Disposition: A | Discharge status: Home / Self Care | Payer: MEDICAID | Attending: Emergency Medicine | Admitting: Emergency Medicine

## 2011-09-16 ENCOUNTER — Encounter (HOSPITAL_COMMUNITY): Payer: Self-pay | Admitting: *Deleted

## 2011-09-16 DIAGNOSIS — K8500 Idiopathic acute pancreatitis without necrosis or infection: Secondary | ICD-10-CM

## 2011-09-16 DIAGNOSIS — K859 Acute pancreatitis without necrosis or infection, unspecified: Secondary | ICD-10-CM

## 2011-09-16 DIAGNOSIS — R110 Nausea: Principal | ICD-10-CM | POA: Diagnosis present

## 2011-09-16 HISTORY — DX: Idiopathic acute pancreatitis without necrosis or infection: K85.00

## 2011-09-16 LAB — URINALYSIS, ROUTINE W REFLEX MICROSCOPIC
Bilirubin Urine: NEGATIVE
Ketones, ur: NEGATIVE mg/dL
Nitrite: NEGATIVE
Specific Gravity, Urine: 1.009 (ref 1.005–1.030)
Urobilinogen, UA: 0.2 mg/dL (ref 0.0–1.0)

## 2011-09-16 LAB — CBC WITH DIFFERENTIAL/PLATELET
Eosinophils Relative: 1 % (ref 0–5)
HCT: 38.1 % (ref 36.0–46.0)
Hemoglobin: 12.8 g/dL (ref 12.0–15.0)
Lymphocytes Relative: 35 % (ref 12–46)
Lymphs Abs: 2.5 10*3/uL (ref 0.7–4.0)
MCH: 30.4 pg (ref 26.0–34.0)
MCV: 90.5 fL (ref 78.0–100.0)
Monocytes Absolute: 0.7 10*3/uL (ref 0.1–1.0)
Monocytes Relative: 10 % (ref 3–12)
Platelets: 198 10*3/uL (ref 150–400)
RBC: 4.21 MIL/uL (ref 3.87–5.11)
WBC: 7.1 10*3/uL (ref 4.0–10.5)

## 2011-09-16 LAB — COMPREHENSIVE METABOLIC PANEL
ALT: 19 U/L (ref 0–35)
Alkaline Phosphatase: 78 U/L (ref 39–117)
BUN: 9 mg/dL (ref 6–23)
CO2: 27 mEq/L (ref 19–32)
Calcium: 10.4 mg/dL (ref 8.4–10.5)
GFR calc Af Amer: >90 mL/min (ref >90)
GFR calc non Af Amer: >90 mL/min (ref >90)
Glucose, Bld: 98 mg/dL (ref 70–99)
Sodium: 138 mEq/L (ref 135–145)

## 2011-09-16 LAB — POCT I-STAT TROPONIN I: Troponin i, poc: 0 ng/mL (ref 0.00–0.08)

## 2011-09-16 MED ORDER — SODIUM CHLORIDE 0.9 % IV SOLN: 1000.0000 mL | INTRAVENOUS | Status: DC

## 2011-09-16 MED ORDER — SODIUM CHLORIDE 0.9 % IV BOLUS (SEPSIS)
1000.0000 mL | Freq: Once | INTRAVENOUS | Status: AC
  Administered 2011-09-16: 1000 mL via INTRAVENOUS

## 2011-09-16 MED ORDER — ONDANSETRON HCL 4 MG/2ML IJ SOLN
4.0000 mg | Freq: Once | INTRAMUSCULAR | Status: AC
  Administered 2011-09-16: 4 mg via INTRAVENOUS
  Filled 2011-09-16: qty 2

## 2011-09-16 MED ORDER — FAMOTIDINE IN NACL 20-0.9 MG/50ML-% IV SOLN
20.0000 mg | Freq: Once | INTRAVENOUS | Status: AC
  Administered 2011-09-16: 20 mg via INTRAVENOUS
  Filled 2011-09-16: qty 50

## 2011-09-16 MED ORDER — GI COCKTAIL ~~LOC~~
30.0000 mL | Freq: Once | ORAL | Status: AC
  Administered 2011-09-16: 30 mL via ORAL
  Filled 2011-09-16: qty 30

## 2011-09-16 MED ORDER — SODIUM CHLORIDE 0.9 % IV SOLN
1000.0000 mL | Freq: Once | INTRAVENOUS | Status: AC
  Administered 2011-09-17: 1000 mL via INTRAVENOUS

## 2011-09-16 MED ORDER — POTASSIUM CHLORIDE CRYS ER 20 MEQ PO TBCR
40.0000 meq | EXTENDED_RELEASE_TABLET | Freq: Once | ORAL | Status: AC
  Administered 2011-09-17: 40 meq via ORAL
  Filled 2011-09-16: qty 2

## 2011-09-16 NOTE — ED Provider Notes (Signed)
History     CSN: [id number]  Arrival date & time 09/16/11  1946   None     Chief Complaint  Patient presents with  . Nausea  . Abdominal Pain    (Consider location/radiation/quality/duration/timing/severity/associated sxs/prior treatment) HPI 61 year old woman, comes in with abdominal pain for the last 24 hours. She reports a history of pancreatitis that was diagnosed several years ago. She describes the pain as bowel, and constant, without any relationship with meals. The pain is mainly in the mid-abdomen and epigastric region, and she does not report radiation of the pain to any other regions of the abdomen. The pain is about 8/10 but by the time I interviewed her, it had reduced to about 3/10. She has nausea, but no vomiting. She denies history of fever or change in bowel habits. Reports history of dark stools but no bloody stool or malena. However, she reports chronic history of constipation with bowel movements every 4 days on average.  She has a history of depression and has recently been under a lot of stress due to loss of her job. She is been eating little and has lost about 40lb over the last 1 year.  Past Medical History  Diagnosis Date  . Hypertension   . Diabetes mellitus   . Pancreatitis 2010    Past Surgical History  Procedure Date  . Cholecystectomy   . Back surgery   . Spine surgery 2006    Dr. Trey Sailors     Family History  Problem Relation Age of Onset  . Cancer Mother   . Alcohol abuse Father     History  Substance Use Topics  . Smoking status: Current Everyday Smoker -- 0.5 packs/day  . Smokeless tobacco: Not on file  . Alcohol Use: No    OB History    Grav Para Term Preterm Abortions TAB SAB Ect Mult Living                  Review of Systems  Constitutional: Negative for appetite change.  HENT: Negative.   Eyes: Negative.   Respiratory: Negative.   Cardiovascular: Negative.   Gastrointestinal: Positive for constipation and abdominal  distention. Negative for nausea, vomiting, diarrhea, blood in stool, anal bleeding and rectal pain.  Genitourinary: Negative.   Musculoskeletal: Negative.   Skin: Negative.   Neurological: Negative for dizziness, seizures, facial asymmetry, speech difficulty and headaches.  Hematological: Negative.   Psychiatric/Behavioral: Negative.     Allergies  Review of patient's allergies indicates no known allergies.  Home Medications   Current Outpatient Rx  Name Route Sig Dispense Refill  . ALPRAZOLAM 0.5 MG PO TABS Oral Take 0.5-1 mg by mouth daily.     Marland Kitchen AMLODIPINE BESYLATE 5 MG PO TABS Oral Take 5 mg by mouth daily.      . ASPIRIN EC 81 MG PO TBEC Oral Take 81 mg by mouth daily.      Marland Kitchen CALCIUM PO Oral Take 1 tablet by mouth daily.    . CYCLOBENZAPRINE HCL 10 MG PO TABS Oral Take 10 mg by mouth daily.     Marland Kitchen HYDROCODONE-ACETAMINOPHEN 10-325 MG PO TABS Oral Take 1-2 tablets by mouth every 8 (eight) hours as needed. For pain    . LISINOPRIL 20 MG PO TABS Oral Take 20 mg by mouth daily.      Marland Kitchen METFORMIN HCL 500 MG PO TABS Oral Take 500 mg by mouth every morning.     . CENTRUM PO CHEW Oral Chew 1  tablet by mouth daily.    Marland Kitchen PRAVASTATIN SODIUM 40 MG PO TABS Oral Take 40 mg by mouth daily.      . SERTRALINE HCL 100 MG PO TABS Oral Take 100 mg by mouth daily.      BP 142/82  Pulse 70  Temp 98.7 F (37.1 C) (Oral)  Resp 18  Ht 5\' 5"  (1.651 m)  Wt 158 lb (71.668 kg)  BMI 26.29 kg/m2  SpO2 98%  Physical Exam  Constitutional: She is oriented to person, place, and time. She appears well-developed and well-nourished. No distress.  HENT:  Head: Normocephalic and atraumatic.  Eyes: Conjunctivae and EOM are normal. Pupils are equal, round, and reactive to light.  Neck: Normal range of motion. Neck supple.  Cardiovascular: Normal rate, regular rhythm, normal heart sounds and intact distal pulses.  Exam reveals no gallop and no friction rub.   No murmur heard. Pulmonary/Chest: Effort normal and  breath sounds normal. No respiratory distress. She has no wheezes. She has no rales. She exhibits no tenderness.  Abdominal: Soft. Bowel sounds are normal. She exhibits no distension and no mass. There is tenderness in the epigastric area and left upper quadrant. There is no rebound, no guarding and negative Murphy's sign. No hernia. Hernia confirmed negative in the ventral area.    Musculoskeletal: Normal range of motion. She exhibits no edema and no tenderness.  Neurological: She is alert and oriented to person, place, and time. She has normal reflexes. She displays normal reflexes. She exhibits normal muscle tone.  Skin: Skin is warm and dry. She is not diaphoretic.  Psychiatric: She has a normal mood and affect.    ED Course  Procedures (including critical care time)  Labs Reviewed  COMPREHENSIVE METABOLIC PANEL - Abnormal; Notable for the following:    Potassium 3.3 (*)     All other components within normal limits  LIPASE, BLOOD - Abnormal; Notable for the following:    Lipase 166 (*)     All other components within normal limits  CBC WITH DIFFERENTIAL  URINALYSIS, ROUTINE W REFLEX MICROSCOPIC  POCT I-STAT TROPONIN I   No results found.      MDM  In this 61 year old woman, with a past medical history of pancreatitis, who presents with me epigastric pain for 24 hours, without any change in bowel habits, but with nausea. The main course and pancreatitis of onset is peptic ulcer disease. Lipase is elevated up to 166 from here baseline of 40-50's. Her K is low at 3.3. [facility] contacted for admission for further evaluation.   Dow Adolph, MD 09/16/11 2343   12:59 AM  Discussed with Dr Despina Hick of Family medicine teaching service and recommended to have the patient discharged and follow up with [facility] tomorrow morning for further evaluation. She will be discharged on Zofran, Tylenol and Potassium.   Dow Adolph, MD 09/19/11 [phone]

## 2011-09-16 NOTE — ED Notes (Signed)
Pt c/o nausea followed by epigastric pain x 2 days.  Pt has hx of pancreatitis but states that this does not feel like it.

## 2011-09-16 NOTE — ED Notes (Signed)
Pt states that since yesterday she has felt nauseous, no vomiting.  Pain in mid abdomen describes as burning.  Denies diarrhea, SOB, CP.

## 2011-09-17 ENCOUNTER — Inpatient Hospital Stay (HOSPITAL_COMMUNITY): Payer: Self-pay

## 2011-09-17 ENCOUNTER — Encounter (HOSPITAL_COMMUNITY): Payer: Self-pay | Admitting: Radiology

## 2011-09-17 MED ORDER — IOHEXOL 300 MG/ML  SOLN
80.0000 mL | Freq: Once | INTRAMUSCULAR | Status: AC | PRN
  Administered 2011-09-17: 80 mL via INTRAVENOUS

## 2011-09-17 MED ORDER — ACETAMINOPHEN 500 MG PO TABS: 500.0000 mg | ORAL_TABLET | Freq: Four times a day (QID) | ORAL | Status: AC | PRN

## 2011-09-17 MED ORDER — ONDANSETRON HCL 4 MG PO TABS: 4.0000 mg | ORAL_TABLET | Freq: Three times a day (TID) | ORAL | Status: AC | PRN

## 2011-09-17 MED ORDER — POTASSIUM CHLORIDE ER 10 MEQ PO TBCR: 10.0000 meq | EXTENDED_RELEASE_TABLET | Freq: Two times a day (BID) | ORAL | Status: DC

## 2011-09-17 NOTE — ED Provider Notes (Signed)
Assuming care of the patient from Dr. Silverio Lay. Pt has clinical impression of pancreatitis, and admission was requested. Family medicine consultant evaluated the patient, and felt that patient can go home, with a f/u in the morning in their clinic. She and i spoke with patient, after patient expressed desire to be discharged to the [facility] medicine doctor, and it appears that patient has had no problems tolerating po.  Her CT results are pending. If the CT shows no gross abnormalities, with stable vitals signs, no problems with po tolerance, and understanding that she needs to be on clear diet with gradual advancement i feel comfortable discharging the patient. Patient expained to return to the ER if her symptoms return. Family medicine doctor to ensure that patient has a f/u in the morning.  Derwood Kaplan, MD 09/17/11 [phone]

## 2011-09-17 NOTE — Progress Notes (Signed)
FPTS BRIEF NOTE  I was asked to evaluate this patient for possible admission from ED for pancreatitis.  Patient reports that she has been having nausea x2 days without vomiting.  Has been able to eat and drink, but decreased 2/2 increased pain with eating.  No other symptoms, including no vomiting, diarrhea, melena, BRBPR, dizziness, fevers/chills, edema, CP, SOB.  Does have some epigastric pain.  Laboratory values show elevated lipase of 166, K 3.3, otherwise totally WNL.  After discussion with pt and EDP, decision was made to let pt d/c home with antinausea medications and f/u in clinic in the AM to ensure she is not worsening.  Crissie Aloi 09/17/2011, 12:48 AM

## 2011-09-17 NOTE — ED Notes (Signed)
Pt has completed PO contrast - CT staff made aware of same

## 2011-09-17 NOTE — ED Notes (Signed)
Transported to CT scan via wheelchair per xray tech

## 2011-09-20 NOTE — ED Provider Notes (Signed)
I have supervised the resident on the management of this patient and agree with the note above. I personally interviewed and examined the patient and my addendum is below.   [name] is a 61 y.o. female hx of pancreatitis here with epigastric pain. Epigastric pain for a day, no radiation. Felt nauseous but no vomiting.   Vitals stable. Exam showed mild epigastric tenderness but no rebound. No RUQ tenderness. Labs showed nl CBC, CMP showed K 3.3. Lipase 166. UA nl.   Patient was diagnosed with mild pancreatitis. She was [facility]. Then, we realized that she has been followed up with Family medicine and the resident called the family medicine physician, Dr. Fara Boros. Dr. Fara Boros felt that given patient is not vomiting, she should follow up outpatient instead of being admitted. Dr. Fara Boros will personally examine the patient in the ED. I signed out to Dr. Rhunette Croft, who followed up the CT ab/pel that was nl and patient was sent home on zofran and pain meds. Patient to follow up the following day.    Richardean Canal, MD 09/20/11 1105

## 2011-09-24 ENCOUNTER — Telehealth: Payer: Self-pay | Admitting: Family Medicine

## 2011-09-24 ENCOUNTER — Ambulatory Visit (INDEPENDENT_AMBULATORY_CARE_PROVIDER_SITE_OTHER): Payer: Self-pay | Admitting: Family Medicine

## 2011-09-24 ENCOUNTER — Encounter: Payer: Self-pay | Admitting: Family Medicine

## 2011-09-24 VITALS — BP 134/84 | HR 70 | Temp 99.1°F | Ht 65.0 in | Wt 162.1 lb

## 2011-09-24 DIAGNOSIS — K859 Acute pancreatitis without necrosis or infection, unspecified: Secondary | ICD-10-CM

## 2011-09-24 LAB — COMPREHENSIVE METABOLIC PANEL
ALT: 18 U/L (ref 0–35)
AST: 22 U/L (ref 0–37)
Alkaline Phosphatase: 66 U/L (ref 39–117)
BUN: 15 mg/dL (ref 6–23)
Creat: 0.75 mg/dL (ref 0.50–1.10)
Total Bilirubin: 0.3 mg/dL (ref 0.3–1.2)

## 2011-09-24 LAB — CBC
HCT: 37.8 % (ref 36.0–46.0)
MCH: 29.8 pg (ref 26.0–34.0)
MCHC: 33.1 g/dL (ref 30.0–36.0)
MCV: 90.2 fL (ref 78.0–100.0)
Platelets: 231 10*3/uL (ref 150–400)
RDW: 13.6 % (ref 11.5–15.5)
WBC: 7.5 10*3/uL (ref 4.0–10.5)

## 2011-09-24 LAB — LIPID PANEL
HDL: 41 mg/dL (ref >39)
Total CHOL/HDL Ratio: 2.9 Ratio
VLDL: 23 mg/dL (ref 0–40)

## 2011-09-24 LAB — LIPASE: Lipase: 303 U/L — ABNORMAL HIGH (ref 0–75)

## 2011-09-24 MED ORDER — HYDROCODONE-ACETAMINOPHEN 10-325 MG PO TABS: 1.0000 | ORAL_TABLET | ORAL | Status: AC | PRN

## 2011-09-24 NOTE — Progress Notes (Signed)
  Subjective:    Patient ID: [name], female    DOB: 1950-04-05, 61 y.o.   MRN: [id number]  HPI # She was diagnosed with acute pancreatitis in the ED last week. It resolved the day following her ED visit, however, returned this morning.  She has had decreased appetite and has not been eating much (spoonfuls at a time) for the past week, although she denied abdominal pain up until this morning.  She is not having problems drinking liquids and has able to drink.  She has not tried any pain medication for the abdominal pain. She takes Vicodin twice a day for chronic back pain but did not take any today.  She denies alcohol use, eating a fatty meal, or starting new medications since last week.   Over the weekend, she went to a family reunion, and she did eat more than usual.  Review of Systems Denies diarrhea/constipation/nausea Denies fevers/chills  Allergies, medication, past medical history reviewed.  Significant for: -Tobacco use--1/2 ppd -Major depression, grief, adjustment disorder--her mother passed away in 2009-2010 from pancreatic cancer, and she is still mourning for her; she is an only child and was very close to her mother -History of pancreatitis: first episode in 2009, then last week, then today -History of cholecystectomy: 1990s    Objective:   Physical Exam GEN: NAD PSYCH: appears depressed; not anxious-appearing HEENT: dry MM ABD: NABS, soft, moderate epigastric tenderness with guarding but no rebound SKIN: 3-4 sec capillary refill    Assessment & Plan:

## 2011-09-24 NOTE — Assessment & Plan Note (Addendum)
She was diagnosed with acute pancreatitis in the ED 08/20 with lipase 166 and CT-abdomen showing signs consistent with acute pancreatitis Her pain improved following her ED visit but returned this morning. She ate more than usual over the weekend (2-3 days ago), but otherwise, the cause for return of her pancreatitis is not clear. She may have advanced her diet too quickly, although she does not seem to eat that much at baseline (she has lost 40 pounds unintentionally; it may be due to her depression and grieving for her mother) -Will check labs: CMET, CBC, lipase, TG>>>WBC WNL, lipase 303 -Patient is able to tolerate liquids. Advised she limit PO intake to clear liquids today and advance slowly as tolerated tomorrow -For pain, will give her an extra Rx for Vicodin. She is under a pain contract with her neurosurgeon Dr. Channing Mutters in [place], [place] (she takes Vicodin for her chronic back pain). We called and notified them that she will receive Rx for 20 tablets due to acute pancreatitis -Follow-up in 2 days or sooner if needed. Given indications to go to ED (refractory or significantly worsening abdominal pain, inability to tolerate PO)

## 2011-09-24 NOTE — Patient Instructions (Addendum)
For the abdominal pain: -Take hydrocodone every 4 hours as needed for the pain -It may make you sleepy and constipated, so do not take if you are sleepy. For constipation, drink plenty of fluids and take a laxative as needed -Try to stay hydrated, drinking some water or other liquid every hour -You may drink liquids today. I would avoid solid food until tomorrow and then only eat bland foods like bread, rice, stocks  Follow-up on Friday

## 2011-09-24 NOTE — Telephone Encounter (Signed)
Pt was seen at ED last week for pancreatitis and it has flared up again and needs to talk with nurse (very teary)

## 2011-09-24 NOTE — Telephone Encounter (Signed)
Patient states she is having much pain in abdomen. No fever. No nausea or vomiting. Consulted with Dr. Gwendolyn Grant and he advises for patient to come to office now. She can be here in 30 minutes.

## 2011-09-30 ENCOUNTER — Ambulatory Visit (INDEPENDENT_AMBULATORY_CARE_PROVIDER_SITE_OTHER): Payer: Self-pay | Admitting: Family Medicine

## 2011-09-30 ENCOUNTER — Encounter: Payer: Self-pay | Admitting: Family Medicine

## 2011-09-30 VITALS — BP 124/79 | HR 75 | Ht 65.0 in | Wt 158.4 lb

## 2011-09-30 DIAGNOSIS — K8500 Idiopathic acute pancreatitis without necrosis or infection: Secondary | ICD-10-CM

## 2011-09-30 DIAGNOSIS — K859 Acute pancreatitis without necrosis or infection, unspecified: Secondary | ICD-10-CM

## 2011-09-30 DIAGNOSIS — F329 Major depressive disorder, single episode, unspecified: Secondary | ICD-10-CM

## 2011-09-30 MED ORDER — MIRTAZAPINE 15 MG PO TABS: 15.0000 mg | ORAL_TABLET | Freq: Every day | ORAL | Status: DC

## 2011-09-30 MED ORDER — SERTRALINE HCL 100 MG PO TABS: 200.0000 mg | ORAL_TABLET | Freq: Every day | ORAL | Status: DC

## 2011-09-30 NOTE — Assessment & Plan Note (Signed)
Uncertain etiology as patient does not drink alcohol, has normal TG's, does not have gall stone, and has had not recent medication changes. It has resolved and CT scan was not concerning for a mass, so I am comfortable with observation at this point.

## 2011-09-30 NOTE — Patient Instructions (Addendum)
Dear Mrs. Transue,   Thank you for coming to clinic today. Please read below regarding the issues that we discussed.   1. Depression - Continue with sertraline 200 mg a day and start the remeron/mirtazepine 15mg  (1 pill) at night. Only one pill of the alprazolam since it will make you tired as well.   2. Pancreatitis - We will really close eye on this. I don't expect it to come back soon, but if it does we will take care of you.   Please follow up in clinic in 1 month. Please call earlier if you have any questions or concerns.   Sincerely,   Dr. Clinton Sawyer

## 2011-09-30 NOTE — Assessment & Plan Note (Signed)
PHQ-9 shows moderate depression. However, she is tearful and perseverates on her mother's death and job loss and repeats details to me about it that she has shared multiple times. Given her concomitant sleeping disorder, for which she takes alprazolam QHS PRN, I think the addition of 15 mg of mirtazapine QHS would be appropriate. She is will to try this. I also gave her the contact information for Dr. Pascal Lux to set up an appointment. My next step in treatment would likely be to taper sertraline and start Effexor to see if an SNRI was more effective.

## 2011-09-30 NOTE — Progress Notes (Signed)
  Subjective:    Patient ID: [name], female    DOB: 24-Feb-1950, 61 y.o.   MRN: [id number]  HPI  61 year old F who presents for follow up of recent pancreatitis and major depression.   1. Pancreatitis - She was originally diagnosed in the ED on 8/21. She was given IV fluids, IV pain medication and had a CT scan that demonstrated inflammation around the head of the pancreas. She was discharged. On 8/28, the patient had a recurrence of pain and presented to the [facility] where she was evaluated by Dr. Madolyn Frieze. She was given ondansetron and PO pain medication and appropriate for outpatient management. On 8/28, her lipase was 303, but all other labs including her WBC and Triglycerides were normal. The patient has returned to a normal diet since last week and denies pain today. She also denies nausea, vomiting, and acholic stools. She is still taking daily hydrocodone for her back pain, but does not need any additional medication for abdominal pain. She denies any new medication changes, and she does not drink alcohol. She has a history of pancreatitis x 1 in July 17, 2009. It is also noteworthy that she had a laparascopic cholecystectomy in 2001/07/17.   2. Depression - Severe depression related to death of her mother in 07/17/08 and loss of her job in 07/17/2009 at [employer]. We had an extensive talk about this on 8/14, and she repeated most of the details today. On 8/14, we decided to increase her Sertraline to 200 mg daily, because she will not longer be going to [facility] since she lacks insurance and her physician, Dr. Raquel James, retired. She has been taking 200 mg of sertraline daily and denies any improvement in mood. She also denies any worsening of mood. She reports a history of counseling through [facility]   PMH: pancreatitis, idiopathic - 2009/07/17 PSH: cholecystectomy 07/17/01  Review of Systems  All other systems reviewed and are negative.       Objective:   Physical Exam  BP 124/79  Pulse 75  Ht  5\' 5"  (1.651 m)  Wt 158 lb 6.4 oz (71.85 kg)  BMI 26.36 kg/m2 Gen: alert, oriented, middle aged AAF, non ill appearing Cardiac: RRR, no murmurs Lungs: CTA-B Abdomen: NDNT, NABS, no guarding Psych: normal thought content, no SI/HI, tearful while completing PHQ-9  PHQ-9: 12 (moderated depression)      Assessment & Plan:  61 y.o. F with resolved idiopathic pancreatitis and stable, but resistant depression.

## 2011-10-01 NOTE — Addendum Note (Signed)
Addended by: Lunabella Badgett V on: 10/01/2011 09:46 PM   Modules accepted: Level of Service  

## 2011-10-06 ENCOUNTER — Telehealth: Payer: Self-pay | Admitting: Family Medicine

## 2011-10-06 ENCOUNTER — Telehealth: Payer: Self-pay | Admitting: Psychology

## 2011-10-06 NOTE — Telephone Encounter (Signed)
Is asking about meds that she is taking for depression - he gave her a new one but has a question about what she has already been taking.

## 2011-10-06 NOTE — Telephone Encounter (Signed)
Patient wants to make sure MD wants her to take alprazolam , remeron  and zoloft . She was prescribed Alprazolam and Zoloft by Dr. Jamas Lav. Advised will send message to MD.

## 2011-10-06 NOTE — Telephone Encounter (Signed)
Patient called to schedule appointment per recommendation by Dr. Clinton Sawyer.  Reviewed note.  Scheduled for first available which was 9/16 at 10:00.  I told her the following: -  If she is unable to make the appointment she needs to call me. -  If she misses the appointment without a phone call, I won't be able to schedule her back in my clinic. She voiced an understanding and was able to repeat the appointment date and time back to me.

## 2011-10-06 NOTE — Telephone Encounter (Signed)
Patient called and confirmed that she should be taking Sertraline 200 mg daily, Mirtazepine 15 mg QHS, and Alprazolam 0.5 mg QHS PRN for sleep. She was able to repeat back to me the regimen. I also asked her to reference her AVS from the previous visit which has the same instructions written out.

## 2011-10-13 ENCOUNTER — Ambulatory Visit (INDEPENDENT_AMBULATORY_CARE_PROVIDER_SITE_OTHER): Payer: Self-pay | Admitting: Psychology

## 2011-10-13 DIAGNOSIS — F329 Major depressive disorder, single episode, unspecified: Secondary | ICD-10-CM

## 2011-10-13 NOTE — Patient Instructions (Addendum)
Please think about the information below and call me when you reach a decision:  [phone].   We talked about your grief and depression.  I asked you to think if you would be ready for a therapy experience that might prove a little more direct than the two experiences you have had in the past - in hopes that doing something different might be helpful.  I would likely challenge you on some of the beliefs that you have regarding your mother's death and the way you are currently living. With regards to your medication.  Zoloft is not a medicine that should be taken just a few times a week.  You need to take it daily and if you aren't interested in that, you should stop it all together because it can not be helpful if you are only taking it a couple of times a week.  Zoloft is a good medicine for depression and I think we both agree that you are depressed.  Taking mediation is ABSOLUTELY a good strategy for helping you feel better.  Therapy is good too and the combination of both of them would likely be best. AVOIDANCE is the number one way people usually deal with fear or discomfort.  If makes sense you want to avoid dealing with your sadness and grief.  It also will keep you in this place of sadness and grief.  You get to decide.

## 2011-10-13 NOTE — Assessment & Plan Note (Addendum)
[name] is neatly groomed and appropriately dressed.  She maintains good eye contact and is cooperative and attentive.  Speech is normal in tone, rate and rhythm.  Mood is sad with a consistent affect.  Thought process is circumstantial.  She needs to be redirected often.  Denied current suicidal or homicidal ideation.  Does not appear to be responding to any internal stimuli.  Judgment and insight are average.  Likely meets the criteria for a major depressive episode.  Did not assess symptomatology specifically but can count off difficulty with appetite, energy, mood and interest / pleasure.  She also seems to experience a significant amount of guilt which seems to go beyond grief.  Her significant other is tired of seeing her so sad and her not wanting to do anything.  She does seem especially motivated to work.  I think the structure would likely prove useful.    Grief seems to be the precipitating event and a major barrier to better management of her mood.  When asked the importance of managing her grief better, she rated it a 7.  In terms of confidence, she was originally a 3-4 but upon further questioning, she reported she has NO confidence that she will be able to better manage her grief over her mother's death.  She has too many regrets.  Seems to engage in some magical thinking around her mother's health and subsequent death.  Engages in a lot of "If...then..." that is probably not accurate and certainly not helpful.    She has not been treating her depression with medication in a recommended way.  She was under the impression that Sertraline was making her sleepy so she was taking it intermittently at best.  Provided some education around this without getting to into what she "should do."  She, of her own accord, stated that she will start taking the Sertraline daily.  She plans to hold the Remeron for now.  See patient instructions for further plan.  Will forward note to Dr. Clinton Sawyer for  collaborative care.

## 2011-10-13 NOTE — Progress Notes (Signed)
[name] presented for an initial psychological assessment.  A client information sheet detailing the Behavioral Medicine Service was provided.  The patient voiced an understanding of what was detailed on this sheet including the issue of confidentiality and the limits thereof.  Presenting Problem: [name] presents for depression and grief.  She denies significant mood disturbance until Jun 12, 2007 when her mother died of pancreatic cancer.  She believes that her mother's healthcare was mismanaged.  Additionally, [name] thinks that if she had been involved earlier, she might have been able to intervene in a way that would have caught the cancer earlier.  Her mother was 79 years old when she died in [facility].  Hospice was involved.    Relevant Medical History: Medical record reviewed including problem list and medications.  Of note, [name] has had two bouts of pancreatitis (it is not on her problem list for some reason).  She also has had back surgery and reports that she is in need of another.  Medications reviewed.  Relevant Psychiatric / Psychological History: [name] went to individual grief counseling through Hospice for about six months but did not report benefit.  She was getting both psychiatric treatment and therapy through [facility] after her mom died.  [name] reports she was hospitalized for suicidal ideation at this time and that this was a traumatic experience for her.  She reports expressing a wish to be dead but had no intention of doing anything to harm herself.  She was hospitalized nonetheless and did not benefit from the experience.  She is currently prescribed Sertraline 200 mg, Remeron 15 mg qhs and Alprazolam 0.5 mg as needed.  She says she took one dose of the Remeron and takes about two doses of the Sertraline weekly.  She takes the Alprazolam for anxiety and also when her pain is bad because it helps to relax her.    She was prescribed Seroquel in the past but read that you can  micro-sleep on it and thinks that could be dangerous.    Family History: Parents were separated when she was in junior high.  Reports that father was an alcoholic.  He was murdered (not sure how hold she was at the time).  [name] had one daughter at age 53 and gave her up for adoption.  She is okay with the decision and has thought about seeking her out but has mixed feelings.  [name] was never married.  She is currently in a long-term relationship with Windy Fast who is a retired Tree surgeon.  They live together in [place].  Her best friend since the 8th grade, Cecille Rubin, lives in the [place] across the hall from her.  She was also close to her only maternal aunt and cousin.    History of Abuse: Did not assess.  Education / Occupation: Did not assess education.  Worked for [employer] for 24 years.  Started in [place], [place] and moved to [place] when that facility closed.  In 06-11-2009, shifted people to working at home.  She took at package instead because working from home was not a good option.  Has been looking for work (more because she wants to work rather than financial reasons) but has been unable to find anything.  She thinks this is because of her age.  She is attending the [employer] job fair this week.    Substance Use: Did not assess today.

## 2011-10-15 ENCOUNTER — Ambulatory Visit: Payer: Self-pay | Admitting: Family Medicine

## 2011-10-29 ENCOUNTER — Encounter: Payer: Self-pay | Admitting: Family Medicine

## 2011-10-29 DIAGNOSIS — K8500 Idiopathic acute pancreatitis without necrosis or infection: Secondary | ICD-10-CM | POA: Insufficient documentation

## 2011-10-30 ENCOUNTER — Ambulatory Visit (INDEPENDENT_AMBULATORY_CARE_PROVIDER_SITE_OTHER): Payer: Self-pay | Admitting: Family Medicine

## 2011-10-30 ENCOUNTER — Encounter: Payer: Self-pay | Admitting: Family Medicine

## 2011-10-30 VITALS — BP 145/88 | HR 74 | Ht 65.0 in | Wt 159.4 lb

## 2011-10-30 DIAGNOSIS — Z1211 Encounter for screening for malignant neoplasm of colon: Secondary | ICD-10-CM

## 2011-10-30 DIAGNOSIS — E785 Hyperlipidemia, unspecified: Secondary | ICD-10-CM

## 2011-10-30 DIAGNOSIS — F329 Major depressive disorder, single episode, unspecified: Secondary | ICD-10-CM

## 2011-10-30 DIAGNOSIS — E119 Type 2 diabetes mellitus without complications: Secondary | ICD-10-CM

## 2011-10-30 DIAGNOSIS — R413 Other amnesia: Secondary | ICD-10-CM

## 2011-10-30 NOTE — Patient Instructions (Addendum)
Dear [name],   Thank you for coming to clinic today. Please read below regarding the issues that we discussed.   1. Depression - Keep taking the Zoloft 100 mg every day. We will see you back in one month to reassess how you are feeling.   2. Memory Problems - I believe that this is related to your depression and will improve as your depression improves.   3. Colon Cancer Screening - Please use the stool cards at home the day before your next visit.   4. Breast Cancer Screening - I suggest getting a mammogram now. If you cannot afford it, we have a scholarship that you can apply for. The nurse will give you information about this.   Please follow up in clinic in 1 month. Please call earlier if you have any questions or concerns.   Sincerely,   Dr. Clinton Sawyer

## 2011-10-30 NOTE — Progress Notes (Signed)
  Subjective:    Patient ID: [name], female    DOB: 27-Apr-1950, 61 y.o.   MRN: [id number]  HPI  61 year old female with hypertension, type 2 diabetes, depression, and chronic lower back pain who presents for followup.  Depression: The patient has severe depression previously noted in her last 2 clinic notes. Since she last saw me the patient saw Dr. Shelby Mattocks, psychologist, for evaluation. During that time it was revealed that the patient was not taking her sertraline as prescribed. He was prescribed 200 mg daily. The patient was taking 100 mg every few days. She felt as it was making her fatigued and did not want to take it. Moreover she was prescribed mirtazapine 15 mg each bedtime, and she was not taking this at all. Mrs. Crockett notes that she is now taking sertraline 100 mg each morning. She does not want to stay tonight, because she takes hydrocodone and Flexeril for back pain at night which makes her fatigued. She enjoyed her meeting with Dr. Pascal Lux, but she currently does not have another counseling session scheduled.  Memory loss: the patient has been several times in the past but she has poor memory. She often says things down in her home and forget where she put them shortly thereafter. She believes this started in Jun 30, 2007 after the death of her mother which brought her depression. This is the most bothersome symptoms to her.  Health maintenance: the patient does not remember when she last had a colonoscopy. She also is not room when she last had a mammogram.   Review of Systems Positive for back pain and fatigue; otherwise negative at less than history of present illness    Objective:   Physical Exam  BP 145/88  Pulse 74  Ht 5\' 5"  (1.651 m)  Wt 159 lb 6.4 oz (72.303 kg)  BMI 26.53 kg/m2 Gen.: afterward female, pleasant and conversant, non-ill and nondistressed Psych: smiles frequently but did exhibit mood lability when talking about her memory loss and cried during the  interview      Assessment & Plan:  61 year old female with severe depression and medical noncompliance which is preventing her from having any improvement in her mood.

## 2011-10-31 DIAGNOSIS — R413 Other amnesia: Secondary | ICD-10-CM | POA: Insufficient documentation

## 2011-10-31 MED ORDER — SERTRALINE HCL 100 MG PO TABS: 100.0000 mg | ORAL_TABLET | Freq: Every day | ORAL | Status: DC

## 2011-10-31 NOTE — Assessment & Plan Note (Signed)
Lipid Panel     Component Value Date/Time   CHOL 120 09/24/2011 1623   TRIG 116 09/24/2011 1623   HDL 41 09/24/2011 1623   CHOLHDL 2.9 09/24/2011 1623   VLDL 23 09/24/2011 1623   LDLCALC 56 09/24/2011 1623   Well-controlled. Recheck August 2014. Continue Pravachol 40 mg daily.

## 2011-10-31 NOTE — Assessment & Plan Note (Addendum)
Patient forthcoming with her desire not to take numerous medications. Therefore we will try only sertraline 100 mg daily. She was encouraged to use every day for at least a month in order to fairly judge its efficacy. She was also encouraged to follow with Dr. Pascal Lux.

## 2011-10-31 NOTE — Assessment & Plan Note (Addendum)
Lab Results  Component Value Date   HGBA1C 6.0 09/24/2011   Diabetes well controlled on current metformin regimen. No changes at this time. followup in December.

## 2011-10-31 NOTE — Assessment & Plan Note (Signed)
This is likely a sequelae of years her major depressive disorder. The differential also includes early-onset dementia. If this does not seem to improve with depression treatment then obtained a Mini-Mental status exam.

## 2011-11-03 ENCOUNTER — Other Ambulatory Visit: Payer: Self-pay | Admitting: Family Medicine

## 2011-11-03 ENCOUNTER — Ambulatory Visit (INDEPENDENT_AMBULATORY_CARE_PROVIDER_SITE_OTHER): Payer: Self-pay | Admitting: *Deleted

## 2011-11-03 DIAGNOSIS — Z23 Encounter for immunization: Secondary | ICD-10-CM

## 2011-11-03 DIAGNOSIS — Z1231 Encounter for screening mammogram for malignant neoplasm of breast: Secondary | ICD-10-CM

## 2011-11-14 ENCOUNTER — Ambulatory Visit
Admission: RE | Admit: 2011-11-14 | Discharge: 2011-11-14 | Disposition: A | Discharge status: Home / Self Care | Payer: Self-pay | Source: Ambulatory Visit | Attending: Family Medicine | Admitting: Family Medicine

## 2011-11-14 ENCOUNTER — Other Ambulatory Visit: Payer: Self-pay | Admitting: Family Medicine

## 2011-11-14 DIAGNOSIS — Z1231 Encounter for screening mammogram for malignant neoplasm of breast: Secondary | ICD-10-CM

## 2011-11-17 ENCOUNTER — Ambulatory Visit
Admission: RE | Admit: 2011-11-17 | Discharge: 2011-11-17 | Disposition: A | Discharge status: Home / Self Care | Payer: Self-pay | Source: Ambulatory Visit | Attending: Family Medicine | Admitting: Family Medicine

## 2011-11-17 DIAGNOSIS — Z1231 Encounter for screening mammogram for malignant neoplasm of breast: Secondary | ICD-10-CM

## 2011-12-16 LAB — HEMOCCULT GUIAC POC 1CARD (OFFICE)

## 2011-12-16 NOTE — Addendum Note (Signed)
Addended by: [place], Lonza Shimabukuro on: 12/16/2011 04:50 PM   Modules accepted: Orders

## 2011-12-19 ENCOUNTER — Encounter: Payer: Self-pay | Admitting: Family Medicine

## 2011-12-19 ENCOUNTER — Ambulatory Visit (INDEPENDENT_AMBULATORY_CARE_PROVIDER_SITE_OTHER): Payer: Self-pay | Admitting: Family Medicine

## 2011-12-19 VITALS — BP 152/82 | HR 70 | Temp 99.4°F | Ht 65.0 in | Wt 158.0 lb

## 2011-12-19 DIAGNOSIS — Z129 Encounter for screening for malignant neoplasm, site unspecified: Secondary | ICD-10-CM

## 2011-12-19 DIAGNOSIS — F329 Major depressive disorder, single episode, unspecified: Secondary | ICD-10-CM

## 2011-12-19 DIAGNOSIS — R634 Abnormal weight loss: Secondary | ICD-10-CM

## 2011-12-19 DIAGNOSIS — I10 Essential (primary) hypertension: Secondary | ICD-10-CM

## 2011-12-19 DIAGNOSIS — Z23 Encounter for immunization: Secondary | ICD-10-CM

## 2011-12-19 DIAGNOSIS — Z1211 Encounter for screening for malignant neoplasm of colon: Secondary | ICD-10-CM

## 2011-12-19 NOTE — Progress Notes (Signed)
  Subjective:    Patient ID: [name], female    DOB: Jan 02, 1951, 61 y.o.   MRN: [id number]  HPI  61 year old F w/ depression, HTN, and type II DM with unintentional weight loss.   1. Hypertension  Home BP monitoring:  BP Readings from Last 3 Encounters:  12/19/11 152/82  10/30/11 145/88  09/30/11 124/79    Prescribed meds: amlodipine 5 mg, lisinopril 20 mg  Hypertension ROS: not taking medications regularly as instructed, no medication side effects noted, no TIA's, no chest pain on exertion, no dyspnea on exertion and no swelling of ankles  2. Nutrition - Patient with unintentional weight loss of several years duration. She approximates 85 pounds and relates it to depression, decreased appetite, and having a more active lifestyle. She is taking a multivitamin. She eats several serving of fruits and vegetable daily.   3. Colorectal Cancer Screening - The patient recently tried the heme-occult stool cards. However, she provided an unsatisfactory sample. She denies and rectal bleeding or melena. She is willing to try this again.   4. Depression - The patient was recently evaluated by Dr. Shelby Mattocks, PhD psychologist for persistent depression related to her mother's death. The patient has not returned to see Dr. Pascal Lux, because she has been busy trying to get a job. She would like to return to see Dr. Pascal Lux. She tried numerous times to discuss her mother's death, but I enforced that we did not have time to cover it today.      Social - Worked for 2 weeks at [employer] but did not complete training   Review of Systems Positive for decreased appetite, memory loss Negative for nausea, vomiting, diarrhea, constipation, weakness    Objective:   Physical Exam BP 152/82  Pulse 70  Temp 99.4 F (37.4 C) (Oral)  Ht 5\' 5"  (1.651 m)  Wt 158 lb (71.668 kg)  BMI 26.29 kg/m2  Gen: middle aged AAF, NAD, talkative  CV: RRR, no murmurs, no carotid bruits Pulm: CT-B Abd: soft, NDNT,  NABS Extr: no edema Psych: smiling but emotionally labile when talking about her mother, perseverates in great detail about her mother's death despite numerous attempts to re-direct      Assessment & Plan:  61 year old F with poorly controlled HTN, poor medication compliance, and unintentional weight loss.

## 2011-12-19 NOTE — Patient Instructions (Signed)
Please follow up in 1 month for your diabetes and hypertension.   It was great to see you today.  Take Care,   V. Clinton Sawyer, MD

## 2011-12-22 DIAGNOSIS — R634 Abnormal weight loss: Secondary | ICD-10-CM | POA: Insufficient documentation

## 2011-12-22 NOTE — Assessment & Plan Note (Signed)
Patient has a history of poor compliance with her medications, including her anti-hypertensives. Therefore, it is not possible to assess their efficacy today. She will follow up in 1 month for repeat BP check.

## 2011-12-22 NOTE — Assessment & Plan Note (Signed)
The patient continues to obsess over her mother's death and displays fantastical thinking about the events. However, her affect was much improved for the majority of the visit compared to previous visits. I tried numerous times to redirect that patient with little success. She was encouraged to follow up with Dr. Pascal Lux or schedule another appointment with her during which her sole focus could be her mood disorder. She was agreeable to this.

## 2011-12-23 DIAGNOSIS — Z1211 Encounter for screening for malignant neoplasm of colon: Secondary | ICD-10-CM | POA: Insufficient documentation

## 2011-12-23 NOTE — Assessment & Plan Note (Addendum)
This seem multi-factorial from increased activity and a less sedentary lifestyle combined with decreased appetite from depression. We are continuing to try to improve her depression, which may help stimulate her appetite. The good news is that she was very overweight and now has a normal BMI. The patient acknowledges that the diabetes is more well controlled now as a result of this weight loss. In reviewing her blood work from August, there is no evidence of nutritional deficiency. At this time, she was encouraged to continue with her current diet as it appears to be very healthy. No red flags for underlying malignancy.

## 2012-01-01 ENCOUNTER — Encounter: Payer: Self-pay | Admitting: Family Medicine

## 2012-01-01 LAB — POC HEMOCCULT BLD/STL (HOME/3-CARD/SCREEN): Card #2 Fecal Occult Blod, POC: NEGATIVE

## 2012-01-01 NOTE — Addendum Note (Signed)
Addended by: [place], Juwann Sherk on: 01/01/2012 04:30 PM   Modules accepted: Orders

## 2012-02-16 ENCOUNTER — Encounter: Payer: Self-pay | Admitting: Family Medicine

## 2012-02-16 ENCOUNTER — Ambulatory Visit (INDEPENDENT_AMBULATORY_CARE_PROVIDER_SITE_OTHER): Payer: No Typology Code available for payment source | Admitting: Family Medicine

## 2012-02-16 VITALS — BP 138/85 | HR 74 | Ht 65.0 in | Wt 165.5 lb

## 2012-02-16 DIAGNOSIS — E119 Type 2 diabetes mellitus without complications: Secondary | ICD-10-CM

## 2012-02-16 DIAGNOSIS — M549 Dorsalgia, unspecified: Secondary | ICD-10-CM

## 2012-02-16 DIAGNOSIS — F329 Major depressive disorder, single episode, unspecified: Secondary | ICD-10-CM

## 2012-02-16 DIAGNOSIS — G8929 Other chronic pain: Secondary | ICD-10-CM

## 2012-02-16 MED ORDER — METFORMIN HCL 500 MG PO TABS: 500.0000 mg | ORAL_TABLET | Freq: Every morning | ORAL | Status: DC

## 2012-02-16 NOTE — Patient Instructions (Signed)
Your Diabetes is very well controlled, but what I am most concerned about is your depression and turmoil about your mother's death. This is making it difficult for [place] to address other issues during our office visit. Therefore, I think it is necessary that you see Dr. Pascal Lux so we can discuss other methods for helping you feel better. Also, we need to get records from Dr. Channing Mutters.   Please follow up in 3 months for a diabetes check.   Sincerely,   Dr. Clinton Sawyer

## 2012-02-16 NOTE — Progress Notes (Signed)
  Subjective:    Patient ID: [name], female    DOB: 1950/05/06, 62 y.o.   MRN: [id number]  HPI  62 year old F with diabetes type 2 follow up.    1. Diabetes Hx of disease - Diagnosed in 1993  Taking and tolerating: yes - Metformin 500 mg daily Fasting blood sugars: not taking  Hypoglycemic symptoms: no Diet Changes: decreased 2/2 depression  Exercise: minimal  Visual problems: wear glasses Last eye exam: 2-3 years  Monitoring feet: yes - few small callouses Numbness/Tingling: no Diabetic Labs:  Lab Results  Component Value Date   HGBA1C 5.8 02/16/2012   HGBA1C 6.0 09/24/2011   HGBA1C  Value: 6.4 (NOTE) The ADA recommends the following therapeutic goal for glycemic control related to Hgb A1c measurement: Goal of therapy: <6.5 Hgb A1c  Reference: American Diabetes Association: Clinical Practice Recommendations 2010, Diabetes Care, 2010, 33: (Suppl  1).* 03/03/2009   Lab Results  Component Value Date   LDLCALC 56 09/24/2011   CREATININE 0.75 09/24/2011   Last microalbumin: No results found for this basename: MICROALBUR, [id number]   Taking an ACE-I ?: yes lisinopril 20 mg    2. Depression - While trying to ask questions about diabetes care and history, the patient continued to try to direct the conversation toward her late mother's death. She stated that she is still very depressed. She is taking the sertraline daily and has not seen Dr. Pascal Lux since her initial visit in September. I encouraged her do to so and tried to redirect the conversation to her diabetes, even telling her numerous times that this issue is something we have discussed at every visit and it is taking away from my ability to evaluate her other medical issues. She agreed, but then continued to talk in great detail for approximately 15 minutes about the events surrounding her mother's death.    Social - Still searching for employment  Care Coordination - Patient would like me to start prescribing her back pain  medications; Previously seeing Dr. Channing Mutters at [facility], who moved to [place]; he currently prescribes cyclobenzaprine and hydrocodone-acetaminophen 10-325    Review of Systems Positive for decreased appetite    Objective:   Physical Exam BP 138/85  Pulse 74  Ht 5\' 5"  (1.651 m)  Wt 165 lb 8 oz (75.07 kg)  BMI 27.54 kg/m2  Gen: middle age AAF, non distressed HEENT: NCAT, PERRLA, EOMI, normal optic discs without neovascualarization  Visual Acuity  L 20/25 R 20/25 Both 20/20 Feet: sensation intact to monofilament, no calluses, no ulcers  Psych: tearful, perseverates on death of mother and could not redirect     Assessment & Plan:  62 year old F with well controlled DM likely secondary to weight loss and poorly controlled depression.

## 2012-02-19 ENCOUNTER — Telehealth: Payer: Self-pay | Admitting: Psychology

## 2012-02-19 NOTE — Assessment & Plan Note (Signed)
She would like me to assume care of her back pain. I will obtain records from Dr. Channing Mutters.

## 2012-02-19 NOTE — Assessment & Plan Note (Signed)
Patient on sertraline 100 mg daily with little improvement. This is the most concerning health issue for the patient and myself. Her obsession with the death of her mother and desire to recount the details of the events at every visit office are becoming an obstacle to evaluating her other health conditions. Despite numerous attempts to redirect today, it was not possible, and as I have stated before, I do not think that she ever remembers that we have talked about her mother's death at every previous visit. I highly encouraged her to talk to Dr. Pascal Lux about these issues, and she will call her this week. I appreciate the assistance of Dr. Pascal Lux in this matter.

## 2012-02-19 NOTE — Telephone Encounter (Signed)
Patient called to schedule beh-med.  Saw her one time this past September and left her with things to consider.  She is taking Zoloft daily now but does not notice a difference.  Will meet on 1/28 at 9:00.    I told her the following: -  If she is unable to make the appointment she needs to call me. -  If she misses the appointment without a phone call, I won't be able to schedule her back in my clinic. She voiced an understanding and was able to repeat the appointment date and time back to me.

## 2012-02-19 NOTE — Assessment & Plan Note (Signed)
Well controlled on metformin. Continue med and place referral for optho exam.

## 2012-02-24 ENCOUNTER — Ambulatory Visit (INDEPENDENT_AMBULATORY_CARE_PROVIDER_SITE_OTHER): Payer: No Typology Code available for payment source | Admitting: Psychology

## 2012-02-24 DIAGNOSIS — F329 Major depressive disorder, single episode, unspecified: Secondary | ICD-10-CM

## 2012-02-24 NOTE — Progress Notes (Signed)
[name] presents for follow-up.  Her first and last visit in beh med was September of last year.  She reports she is basically in the same place.  She has been looking for jobs but thinks her age is getting in the way.  She has physical limitations (can't stand for long periods of time).  The mortgage payment program she is in runs out in May.  She states that her previous psychiatrist, Jules Schick, diagnosed her with dementia and encouraged her to apply but she says she would prefer to find work.  She continues to grieve her mother's death on a daily basis.  She reports bothersome sad mood 10 out of the last 14 days.  There is not much she cares to do.  Her cousin (daughter of her mother's only sister) tells her that she should be further along in the grieving process and encourages her to stop focusing on getting a job and start focusing on "having fun" via line dancing or some other activity.  This is hard for [name] to hear.    Continues to live with boyfriend [name] who is a Network engineer.  He has a showing in [place] coming up.  Best friend still lives across the hall.

## 2012-02-24 NOTE — Assessment & Plan Note (Signed)
Sounds like she continues to meet criteria for Major Depression.  I think she is taking Zoloft regularly but did not check-in on that today.  Will revisit next time.  She seems to ruminate about the details of her mother's death telling me many of the same details as she did last visit.  She is very stuck - wanting things to be undone despite the fact they can't.  Interestingly, she does not think she would be in any different place emotionally if the circumstances of her mother's death were different (i.e. If Mykael didn't think the death was preventable).  She continues to state that she is interested in thinking about her mother's death differently but she is not at all sure that she can.  She would rather be dead but does not have any intent or plan to harm herself.    Gave her bibliotherapy resource on grief (tear soup) and will endeavor to talk about radical acceptance next visit.  Follow-up scheduled for Tuesday the 4th at 9:00.

## 2012-02-25 ENCOUNTER — Encounter: Payer: Self-pay | Admitting: Family Medicine

## 2012-03-02 ENCOUNTER — Ambulatory Visit (INDEPENDENT_AMBULATORY_CARE_PROVIDER_SITE_OTHER): Payer: No Typology Code available for payment source | Admitting: Psychology

## 2012-03-02 DIAGNOSIS — F329 Major depressive disorder, single episode, unspecified: Secondary | ICD-10-CM

## 2012-03-02 NOTE — Progress Notes (Signed)
Patient reports she liked the Tear Soup book.  She was tearful when reading it and it validated her sense that her grief is her own.  She is not quite through the ending part (suggestions for various people in the grief process) and wishes to keep the book until our next appointment.  She had a pest infestation secondary to a tenant in an adjacent condo.  She has spent the past week researching, purchasing and fumigating her condo.    She has a phone interview with [employer] tomorrow and plans to do some research today.  She is excited about this.

## 2012-03-02 NOTE — Assessment & Plan Note (Signed)
Report of mood is good.  Affect is significantly brighter.  She says it has been a "busy" week and she is able to report on a number of activities with regards to her house.  It sounds like her energy and motivation have been better.  When asked directly - she reports about three days out of the past week where she has had bothersome depressed mood that lasted for a pocket of time (not all day).  She is most susceptible when alone in her house.  The focused activity has been helpful.  She thinks getting a job would be helpful as well.    Discussed radical acceptance - not judging whether what happened with her mother as right or wrong and instead accepting what is.  She is not able to do that.  I described the relationship like the long-married couples where one person dies and within a month or two the other one does too - the world just not being a place to be after the loved one is no longer there.  She thinks this describes the type of relationship she had with her mother.  Used MI strategies to further paint this picture.  She reportedly enjoys coming to speak with me and wishes to continue.  She is nervous about getting a job for fear it will interfere with therapy.  Will wait and see.  Next appointment scheduled for:  February 13th at 10:00.

## 2012-03-11 ENCOUNTER — Ambulatory Visit: Payer: No Typology Code available for payment source | Admitting: Psychology

## 2012-03-16 ENCOUNTER — Ambulatory Visit (INDEPENDENT_AMBULATORY_CARE_PROVIDER_SITE_OTHER): Payer: No Typology Code available for payment source | Admitting: Psychology

## 2012-03-16 ENCOUNTER — Telehealth: Payer: Self-pay | Admitting: Family Medicine

## 2012-03-16 DIAGNOSIS — E785 Hyperlipidemia, unspecified: Secondary | ICD-10-CM

## 2012-03-16 DIAGNOSIS — F329 Major depressive disorder, single episode, unspecified: Secondary | ICD-10-CM

## 2012-03-16 NOTE — Progress Notes (Signed)
[name] presents for follow-up.  She had her phone interview with [employer] but no news yet.  She says her nerves are getting worse as the date draws near that her mortgage will no longer be paid by the government.  Her live-in boyfriend helps cover things but it is a stress nonetheless.  It is affecting her sleep.  She is subscribed to a number of services that sends her job prospects.  Her boyfriend travels to art festivals to sell his artwork and he has asked her to join him but she does not think she can take the time away from her job search.    The 5 year anniversary of her mother's death is tomorrow.  She does not do any ritual or act to commemorate her mom's death or celebrate her mother.  She says she is "not there."

## 2012-03-16 NOTE — Telephone Encounter (Signed)
Patient is asking to discontinue Pravastatin and start Simvastatin because the price is extremely different and her pharmacist says they work the same.  She uses Pharmac[facility] on [address].

## 2012-03-16 NOTE — Telephone Encounter (Signed)
Forward to PCP.

## 2012-03-16 NOTE — Assessment & Plan Note (Addendum)
Report of mood is anxious.  Affect is within normal limits. Not tearful today.  Thoughts are clear and goal directed.  Seems to be focused almost exclusively on getting a job as a way to manage her nerves.  She is unable to generate other alternatives.  Asked her to consider warning signs that her current focus on job searching was too narrow and that perhaps trying new experiences (other coping) might be helpful.  Will check in with this.    Grief seems relatively stable given the fact that she is so close to the 5 year anniversary of her mom's death.    Her [facility] DIS card expires in two days.  She has an appointment scheduled with Britta Mccreedy.  She plans to call to reschedule.

## 2012-03-17 MED ORDER — SIMVASTATIN 20 MG PO TABS: 20.0000 mg | ORAL_TABLET | Freq: Every day | ORAL | Status: DC

## 2012-03-17 MED ORDER — SIMVASTATIN 40 MG PO TABS: 40.0000 mg | ORAL_TABLET | Freq: Every day | ORAL | Status: DC

## 2012-03-17 NOTE — Telephone Encounter (Signed)
Called and informed patient of Rx sent to pharmacy.Busick, Rodena Medin

## 2012-03-17 NOTE — Telephone Encounter (Signed)
Simvastatin 40 mg sent to pharmacy. Please let patient know. Thank you.

## 2012-05-07 ENCOUNTER — Encounter: Payer: Self-pay | Admitting: Family Medicine

## 2012-05-07 DIAGNOSIS — E113293 Type 2 diabetes mellitus with mild nonproliferative diabetic retinopathy without macular edema, bilateral: Secondary | ICD-10-CM | POA: Insufficient documentation

## 2012-05-12 ENCOUNTER — Encounter: Payer: Self-pay | Admitting: Family Medicine

## 2012-05-12 ENCOUNTER — Ambulatory Visit (INDEPENDENT_AMBULATORY_CARE_PROVIDER_SITE_OTHER): Payer: No Typology Code available for payment source | Admitting: Family Medicine

## 2012-05-12 VITALS — BP 123/76 | HR 76 | Ht 65.0 in | Wt 164.0 lb

## 2012-05-12 DIAGNOSIS — I10 Essential (primary) hypertension: Secondary | ICD-10-CM

## 2012-05-12 DIAGNOSIS — M25539 Pain in unspecified wrist: Secondary | ICD-10-CM

## 2012-05-12 DIAGNOSIS — E119 Type 2 diabetes mellitus without complications: Secondary | ICD-10-CM

## 2012-05-12 DIAGNOSIS — M25531 Pain in right wrist: Secondary | ICD-10-CM

## 2012-05-12 DIAGNOSIS — M25532 Pain in left wrist: Secondary | ICD-10-CM | POA: Insufficient documentation

## 2012-05-12 MED ORDER — MELOXICAM 15 MG PO TABS: 15.0000 mg | ORAL_TABLET | Freq: Every day | ORAL | Status: DC

## 2012-05-12 NOTE — Progress Notes (Signed)
  Subjective:    Patient ID: [name], female    DOB: 02-08-1950, 62 y.o.   MRN: [id number]  HPI  62 year old F with HTN, DM2 and new onset wrist pain.   Hypertension  Home BP monitoring:  BP Readings from Last 3 Encounters:  05/12/12 123/76  02/16/12 138/85  12/19/11 152/82    Prescribed meds: lisinopril, amlodipine   Hypertension ROS: taking medications as instructed, no medication side effects noted, no TIA's, no chest pain on exertion, no dyspnea on exertion and no swelling of ankles  Diabetes  Taking and tolerating: yes - Metformin 500 mg daily Fasting blood sugars: not checking  Hypoglycemic symptoms: no Diet Changes: juicing with berries and greens Exercise: none Visual problems: no, wears glasses Last eye exam: 04/28/12 - non proliferative retinopathy   Monitoring feet: yes Numbness/Tingling: no Diabetic Labs:  Lab Results  Component Value Date   HGBA1C 5.8 02/16/2012     Lab Results  Component Value Date   LDLCALC 56 09/24/2011   CREATININE 0.75 09/24/2011   Last microalbumin: No results found for this basename: MICROALBUR, [id number]   Taking an ACE-I ?: yes    Bilateral Wrist Pain - started one month ago, hurts daily, wearing braces on wrist with help, pain located main on lateral side at base of thumb/distal radius   Review of Systems Negative unless stated above     Objective:   Physical Exam BP 123/76  Pulse 76  Ht 5\' 5"  (1.651 m)  Wt 164 lb (74.39 kg)  BMI 27.29 kg/m2 Gen: well appearing, non distressed CV: RRR, no murmurs, no JVD, no peripheral edema Lungs: CTA-B MSK: Wrist - no swelling, warmth or joint effusion; tender to palpation along distal radial head with positive bilateral Finkelstein's test       Assessment & Plan:  Wel controlled HTN, DM2, and with new onset wrist pain 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

## 2012-05-12 NOTE — Assessment & Plan Note (Signed)
Cont metformin 500 mg daily and healthy diet.

## 2012-05-12 NOTE — Assessment & Plan Note (Signed)
Likely DeQuervain's tenosynovitis bilaterally. Tx with meloxicam 15 mg daily, ice BID, and wrist splints PRN. Return in 1 month as needed.

## 2012-05-12 NOTE — Assessment & Plan Note (Signed)
Cont lisinopril and amlodipine

## 2012-05-12 NOTE — Patient Instructions (Signed)
Dear Ms. Brazee,   It was wonderful to see you today.   1. Diabetes - Keep taking such good care of yourself. That is wonderful.  2. Wrist Pain - I believe you have inflammation around the tendon sheath at the base of the thumb. It's called tenosynovitis. Continue wearing the wrist braces. Also, you should ice twice a day for 15 minutes for 2 weeks. Lastly, use the Meloxicam once daily. Please take it with food.   Please follow up in 1 month for you wrist pain if it is persistent. It is improves, please return in 3 months for a check on your diabetes.   Sincerely,   Dr. Clinton Sawyer

## 2012-06-23 ENCOUNTER — Telehealth: Payer: Self-pay | Admitting: Family Medicine

## 2012-06-23 DIAGNOSIS — I10 Essential (primary) hypertension: Secondary | ICD-10-CM

## 2012-06-23 MED ORDER — AMLODIPINE BESYLATE 5 MG PO TABS: 5.0000 mg | ORAL_TABLET | Freq: Every day | ORAL | Status: DC

## 2012-06-23 NOTE — Telephone Encounter (Signed)
Pt needs refill on her Norvasc 5mg  - she has not had this filled by [facility] yet and needs this called in.  Karin Golden [place]

## 2012-06-23 NOTE — Telephone Encounter (Signed)
Pt notified.  Sophie Quiles L, CMA  

## 2012-06-23 NOTE — Telephone Encounter (Signed)
Will fwd to MD.  Yessica Putnam L, CMA  

## 2012-06-23 NOTE — Telephone Encounter (Signed)
Prescription sent to pharmacy.  Please let patient know.

## 2012-08-05 ENCOUNTER — Other Ambulatory Visit: Payer: Self-pay

## 2012-08-06 ENCOUNTER — Other Ambulatory Visit: Payer: Self-pay | Admitting: *Deleted

## 2012-08-06 DIAGNOSIS — F329 Major depressive disorder, single episode, unspecified: Secondary | ICD-10-CM

## 2012-08-08 MED ORDER — MIRTAZAPINE 15 MG PO TABS: 15.0000 mg | ORAL_TABLET | Freq: Every day | ORAL | Status: DC

## 2012-08-09 ENCOUNTER — Other Ambulatory Visit: Payer: Self-pay | Admitting: *Deleted

## 2012-08-09 DIAGNOSIS — F329 Major depressive disorder, single episode, unspecified: Secondary | ICD-10-CM

## 2012-08-09 MED ORDER — MIRTAZAPINE 15 MG PO TABS: 15.0000 mg | ORAL_TABLET | Freq: Every day | ORAL | Status: DC

## 2012-08-30 ENCOUNTER — Ambulatory Visit (INDEPENDENT_AMBULATORY_CARE_PROVIDER_SITE_OTHER): Payer: No Typology Code available for payment source | Admitting: Family Medicine

## 2012-08-30 ENCOUNTER — Encounter: Payer: Self-pay | Admitting: Family Medicine

## 2012-08-30 VITALS — BP 132/78 | HR 60 | Ht 65.0 in | Wt 170.0 lb

## 2012-08-30 DIAGNOSIS — I10 Essential (primary) hypertension: Secondary | ICD-10-CM

## 2012-08-30 DIAGNOSIS — E119 Type 2 diabetes mellitus without complications: Secondary | ICD-10-CM

## 2012-08-30 LAB — POCT GLYCOSYLATED HEMOGLOBIN (HGB A1C): Hemoglobin A1C: 5.9

## 2012-08-30 MED ORDER — LISINOPRIL 20 MG PO TABS: 20.0000 mg | ORAL_TABLET | Freq: Every day | ORAL | Status: DC

## 2012-08-30 NOTE — Progress Notes (Signed)
  Subjective:    Patient ID: [name], female    DOB: 1951/01/11, 62 y.o.   MRN: [id number]  HPI  62 year old F with well controlled Type 2 DM and recurrent depression presents for evaluation.   Diabetes, Type 2  Recent Issues:  Medication Compliance:  Diabetes medication: Yes - metformin 500 mg daily  Taking ACE-I: Yes - lisinopril 20 mg daily  Taking statin: Yes  Behavioral:  Home CBG Monitoring: No  Diet changes: Yes - always looking to eat healthy  Exercise: No  Health Maintenance:  Visual problems: No  Last eye exam: 05/12/12  Last dental visit: 2012  Foot ulcers: No   Hypertension  Home BP monitoring:   Office BP: BP Readings from Last 3 Encounters:  08/30/12 132/78  05/12/12 123/76  02/16/12 138/85    Prescribed meds: lisinopril, amlodipine   Hypertension ROS:  Taking medications as prescribed:Yes Chest pain: No Shortness of breath: No Swelling of extremities: No TIA symptoms: No   Review of Systems:  Pertinent items are noted in HPI.     Review of Systems     Objective:   Physical Exam BP 132/78  Pulse 60  Ht 5\' 5"  (1.651 m)  Wt 170 lb (77.111 kg)  BMI 28.29 kg/m2   General: alert, well appearing, and in no distress Eyes: last exam in April, no new changes Cardiovascular: RRR, nl S1 and S2 Feet: warm, good capillary refill  Labs:   Diabetic Labs:  Lab Results  Component Value Date   HGBA1C 5.9 08/30/2012   HGBA1C 5.8 02/16/2012   HGBA1C 6.0 09/24/2011   Lab Results  Component Value Date   LDLCALC 56 09/24/2011   CREATININE 0.75 09/24/2011   Last microalbumin: No results found for this basename: MICROALBUR, [id number]       Assessment & Plan:

## 2012-08-30 NOTE — Patient Instructions (Addendum)
Dear Ms. Delio,   It was wonderful to see you today. Your diabetes is very well controlled, as is your blood pressure.   Please follow up for a complete physical exam. You can wait until December, around the time of your birthday, if you would like.   Sincerely,   Dr. Clinton Sawyer

## 2012-09-01 NOTE — Assessment & Plan Note (Signed)
Well controlled, cont lisinopril and amlodipine

## 2012-09-01 NOTE — Assessment & Plan Note (Signed)
Well controlled. Continue Metformin 500 mg daily. Check renal function at next visit.

## 2012-09-13 ENCOUNTER — Encounter: Payer: Self-pay | Admitting: Family Medicine

## 2012-09-13 ENCOUNTER — Ambulatory Visit (INDEPENDENT_AMBULATORY_CARE_PROVIDER_SITE_OTHER): Payer: No Typology Code available for payment source | Admitting: Family Medicine

## 2012-09-13 VITALS — BP 120/60 | HR 74 | Temp 99.1°F | Ht 65.0 in | Wt 175.0 lb

## 2012-09-13 DIAGNOSIS — M6530 Trigger finger, unspecified finger: Secondary | ICD-10-CM

## 2012-09-13 NOTE — Patient Instructions (Addendum)
Trigger Finger  Trigger finger (digital tendinitis and stenosing tenosynovitis) is a common disorder that causes an often painful catching of the fingers or thumb. It occurs as a clicking, snapping or locking of a finger in the palm of the hand. The reason for this is that there is a problem with the tendons which flex the fingers sliding smoothly through their sheaths. The cause of this may be inflammation of the tendon and sheath, or from a thickening or nodule in the tendon. The condition may occur in any finger or a couple fingers at the same time. The cause may be overuse while doing the same activity over and over again with your hands.   Tendons are the tough cords that connect the muscles to bones. Muscles and tendons are part of the system which allows your body to move. When muscles contract in the forearm on the palm side, they pull the tendons toward the elbow and cause the fingers and thumb to bend (flex) toward the palm. These are the flexor tendons. The tendons slide through a slippery smooth membrane (synovium) which is called the tendon sheath. The sheaths have areas of tough fibrous tissues surrounding them which hold the tendons close to the bone. These are called pulleys because they work like a pulley. The first pulley is in the palm of the hand near the crease which runs across your palm. If the area of the tendon thickening is near the pulley, the tendon cannot slide smoothly through the pulley and this causes the trigger finger.  The finger may lock with the finger curled or suddenly straighten out with a snap. This is more common in patients with rheumatoid arthritis and diabetes. Left untreated, the condition may get worse to the point where the finger becomes locked in flexion, like making a fist, or less commonly locked with the finger straightened out.  DIAGNOSIS   Your caregiver will easily make this diagnosis on examination.  TREATMENT   · Splinting for 6 to 8 weeks of time may be  helpful. Use the splints as your caregiver suggests.  · Heat used for twenty minutes at least four times a day followed by ice packs for twenty minutes unless directed otherwise by your caregiver may be helpful. If you find either heat or cold seems to be making the problem worse, quit using them and ask your caregiver for directions.  · Cortisone injections along with splinting may speed up recovery. Several injections may be required. Cortisone may give relief after one injection.  · Only take over-the-counter or prescription medicines for pain, discomfort, or fever as directed by your caregiver.  · Surgery is another treatment that may be used if conservative treatments using injection and splinting does not work. Surgery can be minor without incisions (a cut does not have to be made) and can be done with a needle through the skin. No stitches are needed and most patients may return to work the same day.  · Other surgical choices involve an open procedure where the surgeon opens the hand through a small incision (cut) and cuts the pulley so the tendon can again slide smoothly. Your hand will still work fine. This small operation requires stitches and the recovery will be a little longer and the incisions will need to be protected until completely healed. You may have to limit your activities for up to 6 months.  · Occupational or hand therapy may be required if there is stiffness remaining in the finger.    RISKS AND COMPLICATIONS  Complications are uncommon but some problems that may occur are:  · Recurrence of the trigger finger. This does not mean that the surgery was not well done. It simply means that you may have formed scar tissue following surgery that causes the problem to reoccur.  · Infection which could ruin the results of the surgery and can result in a finger which is frozen and can not move normally.  · Nerve injury is possible which could result in permanent numbness of one or more fingers.  CARE  AFTER SURGERY  · Elevate your hand above your heart and use ice as instructed.  · Follow instructions regarding finger motion/exercise.  · Keep the surgical wound dry for at least 48 hrs or longer if instructed.  · Keep your follow-up appointments.  · Return to work and normal activities as instructed.  SEEK IMMEDIATE MEDICAL CARE IF:   Your problems are getting worse or you do not obtain relief from the treatment.  Document Released: 11/03/2003 Document Revised: 04/07/2011 Document Reviewed: 06/27/2008  [employer]® Patient Information ©2014 [employer].

## 2012-09-13 NOTE — Assessment & Plan Note (Signed)
Trigger finger of her right ring finger -Patient provided splint to prevent flexion of the right ring finger, she is to wear this for the next 6-8 weeks as tolerated -She may continue to use Mobic and ice the affected area to decrease inflammation -She was counseled to return to office if her symptoms are not improving within the next 4-6 weeks

## 2012-09-13 NOTE — Progress Notes (Signed)
  Subjective:    Patient ID: [name], female    DOB: August 18, 1950, 62 y.o.   MRN: [id number]  HPI 62 year old female presents with pain in the palmar surface of her right hand, she notices that she has pain with flexion and extension of her right ring finger, there is associated locking of this finger, this is a present for the past 4-5 days, sh she had one previous episode approximately one month ago that resolved spontaneously, she has been taking Mobic for other aches and pains in her bilateral wrists, she is at attempted ice to the area, no warmth, no fevers or chills   Review of Systems  Constitutional: Negative for fever and chills.  Musculoskeletal: Positive for joint swelling.       Objective:   Physical Exam Vitals: Reviewed, blood pressure improved with patient sitting in room MSK: Significant triggering of the right ring finger with extension, tenderness to palpation in the palmar aspect over the flexor tendon of the right ring finger, small nodule palpable at the base of the right ring finger on the palmar aspect, no warmth present Vascular: Capillary refill normal on the right han, 2+ radial pulse       Assessment & Plan:  See problem specific assessment and plan

## 2012-10-05 ENCOUNTER — Encounter: Payer: Self-pay | Admitting: Family Medicine

## 2012-10-05 ENCOUNTER — Telehealth: Payer: Self-pay | Admitting: Family Medicine

## 2012-10-05 ENCOUNTER — Ambulatory Visit (INDEPENDENT_AMBULATORY_CARE_PROVIDER_SITE_OTHER): Payer: No Typology Code available for payment source | Admitting: Family Medicine

## 2012-10-05 VITALS — BP 165/93 | HR 64 | Ht 65.0 in | Wt 176.9 lb

## 2012-10-05 DIAGNOSIS — M6530 Trigger finger, unspecified finger: Secondary | ICD-10-CM

## 2012-10-05 MED ORDER — METHYLPREDNISOLONE ACETATE 80 MG/ML IJ SUSP
20.0000 mg | Freq: Once | INTRAMUSCULAR | Status: AC
  Administered 2012-10-05: 20 mg via INTRALESIONAL

## 2012-10-05 NOTE — Progress Notes (Signed)
  Subjective:    Patient ID: [name], female    DOB: 26-Dec-1950, 62 y.o.   MRN: [id number]  HPI 62 year old female presents for followup of right ring finger trigger finger, she was evaluated in office approximately 3 weeks ago, she has been wearing splints on a daily basis which is provided minimal relief of her pain, she is on Mobic daily for arthritis, she states that she has not been icing the area has previously counseled to, she states that the triggering upon extension of her finger has been worsening, no associated skin changes, no fevers, no chills   Review of Systems  Constitutional: Negative for fever, chills and fatigue.  Respiratory: Negative for cough, chest tightness and wheezing.   Cardiovascular: Negative for chest pain.       Objective:   Physical Exam Vitals: Reviewed General: Pleasant African American female in no acute distress MSK: Patient continues to have significant tenderness of the palmar aspect of the right ring finger over the distal palmar crease, triggering is noted upon extension, patient does have limited flexion of the finger, no overlying skin changes or erythema noted  Procedure note: Trigger finger injection of right ring finger, risks and benefits of the procedure were discussed with the patient, written consent was obtained, the affected area was cleaned in a sterile fashion using alcohol swabs, a 5/8 inch 25-gauge needle was used to inject 20 mg of Depo-Medrol into the right ring finger flexor tendon at the distal palmar crease, patient tolerated the procedure well, no complications, the injected area was covered with a Band-Aid       Assessment & Plan:  Please see problem specific assessment and plan.

## 2012-10-05 NOTE — Telephone Encounter (Signed)
Please call patient back regarding the pain in her hand and arm.  She is curious if this is normal or not.

## 2012-10-05 NOTE — Assessment & Plan Note (Signed)
Patient continues to have significant pain and triggering of the right ring finger, splinting has provided minimal relief -steroid injection of right trigger finger performed today as outlined procedure note, patient was counseled to continue to wear the splint on a daily basis

## 2012-10-05 NOTE — Patient Instructions (Addendum)
Continue to wear the splint during the day. The steroid should start to work in 1-2 weeks. Continue to ice daily and avoid activities that aggravate your finger.

## 2012-10-05 NOTE — Telephone Encounter (Signed)
Patient had steroid injection for right ring finger trigger finger performed this morning, having right medial elbow pain, starts in hand at sight of injection and radiates to elbow, no fevers, no redness to the injected area. Counseled her that we likely aggravated the flexor tendon/muscle with the injection. She is to ice the affected area and take her Mobic. Counseled her on signs of infection.

## 2012-10-05 NOTE — Telephone Encounter (Signed)
Called pt back and she reports pain in her right hand radiates into right elbow. Pain 9/10. Denies numbness, just wanted to know it this is normal after the injection she received today. Will fwd. To Dr.Fletke for review and call pt back.  Lorenda Hatchet, Renato Battles

## 2012-10-19 ENCOUNTER — Other Ambulatory Visit: Payer: Self-pay | Admitting: Family Medicine

## 2012-10-19 DIAGNOSIS — F329 Major depressive disorder, single episode, unspecified: Secondary | ICD-10-CM

## 2012-10-19 MED ORDER — SERTRALINE HCL 100 MG PO TABS: 200.0000 mg | ORAL_TABLET | Freq: Every day | ORAL | Status: DC

## 2012-10-20 ENCOUNTER — Ambulatory Visit (INDEPENDENT_AMBULATORY_CARE_PROVIDER_SITE_OTHER): Payer: No Typology Code available for payment source | Admitting: *Deleted

## 2012-10-20 DIAGNOSIS — Z23 Encounter for immunization: Secondary | ICD-10-CM

## 2012-10-27 ENCOUNTER — Ambulatory Visit: Payer: No Typology Code available for payment source

## 2012-11-10 ENCOUNTER — Other Ambulatory Visit: Payer: Self-pay | Admitting: Family Medicine

## 2012-11-10 DIAGNOSIS — F329 Major depressive disorder, single episode, unspecified: Secondary | ICD-10-CM

## 2012-11-10 MED ORDER — SERTRALINE HCL 100 MG PO TABS: 200.0000 mg | ORAL_TABLET | Freq: Every day | ORAL | Status: DC

## 2012-11-17 DIAGNOSIS — Z23 Encounter for immunization: Secondary | ICD-10-CM

## 2012-12-10 ENCOUNTER — Other Ambulatory Visit: Payer: Self-pay | Admitting: Family Medicine

## 2013-02-02 ENCOUNTER — Other Ambulatory Visit: Payer: Self-pay | Admitting: Family Medicine

## 2013-02-08 ENCOUNTER — Encounter (HOSPITAL_COMMUNITY): Payer: Self-pay | Admitting: Emergency Medicine

## 2013-02-08 ENCOUNTER — Emergency Department (HOSPITAL_COMMUNITY)
Admission: EM | Admit: 2013-02-08 | Discharge: 2013-02-08 | Disposition: A | Discharge status: Home / Self Care | Payer: No Typology Code available for payment source | Source: Home / Self Care | Attending: Emergency Medicine | Admitting: Emergency Medicine

## 2013-02-08 ENCOUNTER — Emergency Department (INDEPENDENT_AMBULATORY_CARE_PROVIDER_SITE_OTHER): Payer: No Typology Code available for payment source

## 2013-02-08 DIAGNOSIS — M5412 Radiculopathy, cervical region: Secondary | ICD-10-CM

## 2013-02-08 MED ORDER — HYDROCODONE-ACETAMINOPHEN 5-325 MG PO TABS: ORAL_TABLET | ORAL | Status: DC

## 2013-02-08 MED ORDER — KETOROLAC TROMETHAMINE 60 MG/2ML IM SOLN
60.0000 mg | Freq: Once | INTRAMUSCULAR | Status: AC
  Administered 2013-02-08: 60 mg via INTRAMUSCULAR

## 2013-02-08 MED ORDER — METHYLPREDNISOLONE ACETATE 80 MG/ML IJ SUSP
80.0000 mg | Freq: Once | INTRAMUSCULAR | Status: AC
  Administered 2013-02-08: 80 mg via INTRAMUSCULAR

## 2013-02-08 MED ORDER — METHYLPREDNISOLONE ACETATE 80 MG/ML IJ SUSP
INTRAMUSCULAR | Status: AC
  Filled 2013-02-08: qty 1

## 2013-02-08 MED ORDER — KETOROLAC TROMETHAMINE 60 MG/2ML IM SOLN
INTRAMUSCULAR | Status: AC
  Filled 2013-02-08: qty 2

## 2013-02-08 NOTE — ED Notes (Signed)
Pt c/o right shoulder pain onset last night Pain starts at right shoulder and radiates down to right hand Denies: inj/trauma, numbness/tingly Pain increases w/activity and is unable to raise arm above hand.  Alert w/no signs of acute distress.

## 2013-02-08 NOTE — Discharge Instructions (Signed)
Continue the meloxicam and cyclobenzaprine daily.  May apply heat and muscle rub.  TREATMENT  Treatment initially involves the use of ice and medication to help reduce pain and inflammation. It is also important to perform strengthening and stretching exercises and modify activities that worsen symptoms so the injury does not get worse. These exercises may be performed at home or with a therapist. For patients who experience severe symptoms, a soft padded collar may be recommended to be worn around the neck.  Improving your posture may help reduce symptoms. Posture improvement includes pulling your chin and abdomen in while sitting or standing. If you are sitting, sit in a firm chair with your buttocks against the back of the chair. While sleeping, try replacing your pillow with a small towel rolled to 2 inches in diameter, or use a cervical pillow. Poor sleeping positions delay healing.   MEDICATION   If pain medication is necessary, nonsteroidal anti-inflammatory medications, such as aspirin and ibuprofen, or other minor pain relievers, such as acetaminophen, are often recommended.  Do not take pain medication for 7 days before surgery.  Prescription pain relievers may be given if deemed necessary by your caregiver. Use only as directed and only as much as you need.  HEAT AND COLD:   Cold treatment (icing) relieves pain and reduces inflammation. Cold treatment should be applied for 10 to 15 minutes every 2 to 3 hours for inflammation and pain and immediately after any activity that aggravates your symptoms. Use ice packs or an ice massage.  Heat treatment may be used prior to performing the stretching and strengthening activities prescribed by your caregiver, physical therapist, or athletic trainer. Use a heat pack or a warm soak.  SEEK MEDICAL CARE IF:   Symptoms get worse or do not improve in 2 weeks despite treatment.  New, unexplained symptoms develop (drugs used in treatment may  produce side effects).  EXERCISES RANGE OF MOTION (ROM) AND STRETCHING EXERCISES - Cervical Strain and Sprain These exercises may help you when beginning to rehabilitate your injury. In order to successfully resolve your symptoms, you must improve your posture. These exercises are designed to help reduce the forward-head and rounded-shoulder posture which contributes to this condition. Your symptoms may resolve with or without further involvement from your physician, physical therapist or athletic trainer. While completing these exercises, remember:   Restoring tissue flexibility helps normal motion to return to the joints. This allows healthier, less painful movement and activity.  An effective stretch should be held for at least 20 seconds, although you may need to begin with shorter hold times for comfort.  A stretch should never be painful. You should only feel a gentle lengthening or release in the stretched tissue.  STRETCH- Axial Extensors  Lie on your back on the floor. You may bend your knees for comfort. Place a rolled up hand towel or dish towel, about 2 inches in diameter, under the part of your head that makes contact with the floor.  Gently tuck your chin, as if trying to make a "double chin," until you feel a gentle stretch at the base of your head.  Hold _____10_____ seconds. Repeat _____10_____ times. Complete this exercise _____2_____ times per day.   STRETECH - Axial Extension   Stand or sit on a firm surface. Assume a good posture: chest up, shoulders drawn back, abdominal muscles slightly tense, knees unlocked (if standing) and feet hip width apart.  Slowly retract your chin so your head slides back and your  chin slightly lowers.Continue to look straight ahead.  You should feel a gentle stretch in the back of your head. Be certain not to feel an aggressive stretch since this can cause headaches later.  Hold for ____10______ seconds. Repeat _____10_____ times.  Complete this exercise ____2______ times per day.  STRETCH  Cervical Side Bend   Stand or sit on a firm surface. Assume a good posture: chest up, shoulders drawn back, abdominal muscles slightly tense, knees unlocked (if standing) and feet hip width apart.  Without letting your nose or shoulders move, slowly tip your right / left ear to your shoulder until your feel a gentle stretch in the muscles on the opposite side of your neck.  Hold _____10_____ seconds. Repeat _____10_____ times. Complete this exercise _____2_____ times per day.  STRETCH  Cervical Rotators   Stand or sit on a firm surface. Assume a good posture: chest up, shoulders drawn back, abdominal muscles slightly tense, knees unlocked (if standing) and feet hip width apart.  Keeping your eyes level with the ground, slowly turn your head until you feel a gentle stretch along the back and opposite side of your neck.  Hold _____10_____ seconds. Repeat ____10______ times. Complete this exercise ____2______ times per day.  RANGE OF MOTION - Neck Circles   Stand or sit on a firm surface. Assume a good posture: chest up, shoulders drawn back, abdominal muscles slightly tense, knees unlocked (if standing) and feet hip width apart.  Gently roll your head down and around from the back of one shoulder to the back of the other. The motion should never be forced or painful.  Repeat the motion 10-20 times, or until you feel the neck muscles relax and loosen. Repeat ____10______ times. Complete the exercise _____2_____ times per day.  STRENGTHENING EXERCISES - Cervical Strain and Sprain These exercises may help you when beginning to rehabilitate your injury. They may resolve your symptoms with or without further involvement from your physician, physical therapist or athletic trainer. While completing these exercises, remember:   Muscles can gain both the endurance and the strength needed for everyday activities through controlled  exercises.  Complete these exercises as instructed by your physician, physical therapist or athletic trainer. Progress the resistance and repetitions only as guided.  You may experience muscle soreness or fatigue, but the pain or discomfort you are trying to eliminate should never worsen during these exercises. If this pain does worsen, stop and make certain you are following the directions exactly. If the pain is still present after adjustments, discontinue the exercise until you can discuss the trouble with your clinician.  STRENGTH Cervical Flexors, Isometric  Face a wall, standing about 6 inches away. Place a small pillow, a ball about 6-8 inches in diameter, or a folded towel between your forehead and the wall.  Slightly tuck your chin and gently push your forehead into the soft object. Push only with mild to moderate intensity, building up tension gradually. Keep your jaw and forehead relaxed.  Hold 10 to 20 seconds. Keep your breathing relaxed.  Release the tension slowly. Relax your neck muscles completely before you start the next repetition. Repeat _____10_____ times. Complete this exercise _____2_____ times per day.  STRENGTH- Cervical Lateral Flexors, Isometric   Stand about 6 inches away from a wall. Place a small pillow, a ball about 6-8 inches in diameter, or a folded towel between the side of your head and the wall.  Slightly tuck your chin and gently tilt your head into the  soft object. Push only with mild to moderate intensity, building up tension gradually. Keep your jaw and forehead relaxed.  Hold 10 to 20 seconds. Keep your breathing relaxed.  Release the tension slowly. Relax your neck muscles completely before you start the next repetition. Repeat _____10_____ times. Complete this exercise ____2______ times per day.  STRENGTH  Cervical Extensors, Isometric   Stand about 6 inches away from a wall. Place a small pillow, a ball about 6-8 inches in diameter, or a  folded towel between the back of your head and the wall.  Slightly tuck your chin and gently tilt your head back into the soft object. Push only with mild to moderate intensity, building up tension gradually. Keep your jaw and forehead relaxed.  Hold 10 to 20 seconds. Keep your breathing relaxed.  Release the tension slowly. Relax your neck muscles completely before you start the next repetition. Repeat _____10_____ times. Complete this exercise _____2_____ times per day.  POSTURE AND BODY MECHANICS CONSIDERATIONS - Cervical Strain and Sprain Keeping correct posture when sitting, standing or completing your activities will reduce the stress put on different body tissues, allowing injured tissues a chance to heal and limiting painful experiences. The following are general guidelines for improved posture. Your physician or physical therapist will provide you with any instructions specific to your needs. While reading these guidelines, remember:  The exercises prescribed by your provider will help you have the flexibility and strength to maintain correct postures.  The correct posture provides the optimal environment for your joints to work. All of your joints have less wear and tear when properly supported by a spine with good posture. This means you will experience a healthier, less painful body.  Correct posture must be practiced with all of your activities, especially prolonged sitting and standing. Correct posture is as important when doing repetitive low-stress activities (typing) as it is when doing a single heavy-load activity (lifting). PROLONGED STANDING WHILE SLIGHTLY LEANING FORWARD When completing a task that requires you to lean forward while standing in one place for a long time, place either foot up on a stationary 2-4 inch high object to help maintain the best posture. When both feet are on the ground, the low back tends to lose its slight inward curve. If this curve flattens (or  becomes too large), then the back and your other joints will experience too much stress, fatigue more quickly and can cause pain.  RESTING POSITIONS Consider which positions are most painful for you when choosing a resting position. If you have pain with flexion-based activities (sitting, bending, stooping, squatting), choose a position that allows you to rest in a less flexed posture. You would want to avoid curling into a fetal position on your side. If your pain worsens with extension-based activities (prolonged standing, working overhead), avoid resting in an extended position such as sleeping on your stomach. Most people will find more comfort when they rest with their spine in a more neutral position, neither too rounded nor too arched. Lying on a non-sagging bed on your side with a pillow between your knees, or on your back with a pillow under your knees will often provide some relief. Keep in mind, being in any one position for a prolonged period of time, no matter how correct your posture, can still lead to stiffness. WALKING Walk with an upright posture. Your ears, shoulders and hips should all line-up. OFFICE WORK When working at a desk, create an environment that supports good, upright posture. Without  extra support, muscles fatigue and lead to excessive strain on joints and other tissues. CHAIR:  A chair should be able to slide under your desk when your back makes contact with the back of the chair. This allows you to work closely.  The chair's height should allow your eyes to be level with the upper part of your monitor and your hands to be slightly lower than your elbows.  Body position:  Your feet should make contact with the floor. If this is not possible, use a foot rest.  Keep your ears over your shoulders. This will reduce stress on your neck and low back. Document Released: 01/13/2005 Document Revised: 04/07/2011 Document Reviewed: 04/27/2008 [facility] Patient Information  2013 [place], [place].  Most shoulder pain is caused by soft tissue problems rather than arthritis.  Rotator cuff tendonitis or tendonosis, rotator cuff tears, impingement syndrome and cartilege (labrum tears) are a few of the common causes of shoulder pain.  Fortunately, most of these can be treated with conservative measures as outlined below.  Do not do the following:  Doing any work with the arms above shoulder level (especially lifting) until the pain has subsided.  Sleeping on the affected side.  Especially avoid sleeping with your arm under your head or your pillow.  This is a habit that is hard to break.  Some people have to pin the arm of their pajamas to the chest area to prevent this.  Do the following:  Do the shoulder exercises below twice daily followed by ice for 10 minutes.  If no better in 1 month, follow up here, with your primary care doctor, or with an orthopedist.  Use of over the counter pain meds can be of help.  Tylenol (or acetaminophen) is the safest to use.  It often helps to take this regularly.  You can take up to 2 325 mg tablets 5 times daily, but it best to start out much lower that that, perhaps 2 325 mg tablets twice daily, then increase from there. People who are on the blood thinner warfarin have to be careful about taking high doses of Tylenol.  For people who are able to tolerate them, ibuprofen and Aleve can also help with the pain.  You should discuss these agents with your physician before taking them.  People with chronic kidney disease, hypertension, peptic ulcer disease, and reflux can suffer adverse side effects. They should not be taken with warfarin. The maximum dosage of ibuprofen is 800 mg 3 times daily with meals.  The maximum dosage of Aleve is 2 and 1/2 tablets twice daily with food, but again, start out low and gradually increase the dose until adequate pain relief is achieved. Ibuprofen and Aleve should always be taken with food.

## 2013-02-08 NOTE — ED Provider Notes (Signed)
Chief Complaint:   Chief Complaint  Patient presents with  . Shoulder Pain    History of Present Illness:   [name] is a 63 year old female who presents tonight with a two-day history of right shoulder pain. Actually the pain radiates from the shoulder on down to the tip of the right middle finger. She denies any numbness or tingling in the hand or the finger, the whole arm feels weak. The pain is worse with any movement of the arm including the shoulder, elbow, wrist, or hand. It is not worse with movement of the neck. She denies any injury to the shoulder or the neck. She does have a history of degenerative disc disease of the lumbar spine which was operated on by Dr. Trey Sailors.  Review of Systems:  Other than noted above, the patient denies any of the following symptoms: Systemic:  No fevers, chills, sweats, or aches.  No fatigue or tiredness. Musculoskeletal:  No joint pain, arthritis, bursitis, swelling, back pain, or neck pain. Neurological:  No muscular weakness, paresthesias, headache, or trouble with speech or coordination.  No dizziness.  PMFSH:  Past medical history, family history, social history, meds, and allergies were reviewed. She is allergic to erythromycin. She takes metformin, Zocor, lisinopril, and Zoloft. She has high blood pressure, diabetes, hypercholesterolemia, and she states she has dementia, although I do not see any record of this in her chart. She does have a history of depression. She is a smoker. She's a patient at the [facility]. She is retired and lives with a boyfriend.  Physical Exam:   Vital signs:  BP 158/92  Pulse 66  Temp(Src) 98.8 F (37.1 C)  Resp 16  Ht 5\' 5"  (1.651 m)  Wt 135 lb (61.236 kg)  BMI 22.47 kg/m2  SpO2 96% Gen:  Alert and oriented times 3.  In no distress. Neck: No tenderness to palpation, adenopathy, or mass. Neck has a full range of motion in all directions with no pain on movement of the neck does not reproduce the  pain, however Adson's maneuver was positive. Musculoskeletal: She has pain to palpation over the right shoulder anteriorly but not posteriorly, laterally, or superiorly. There is pain to palpation up and down the entire arm including the hand and the fingers. The shoulder has a decreased range of motion with pain on movement, but she also has pain on palpation of the muscles of the upper arm, palpation over the elbow, movement of the elbow, palpation of the muscles of the forearm, wrist, movement of the wrist, and palpation of the fingers and hand. Neer test was positive.  Hawkins test was positive.  Empty cans test was positive. Otherwise, all joints had a full a ROM with no swelling, bruising or deformity.  No edema, pulses full. Extremities were warm and pink.  Capillary refill was brisk.  Skin:  Clear, warm and dry.  No rash. Neuro:  Alert and oriented times 3.  Muscle strength was normal.  Sensation was intact to light touch.   Radiology:  Dg Shoulder Right  02/08/2013   CLINICAL DATA:  Shoulder pain  EXAM: RIGHT SHOULDER - 2+ VIEW  COMPARISON:  None.  FINDINGS: Mild degenerative changes of the acromioclavicular and glenohumeral articulation are noted. No acute fracture or dislocation is seen. The underlying bony thorax is unremarkable.  IMPRESSION: Degenerative change without acute abnormality.   Electronically Signed   By: Alcide Clever M.D.   On: 02/08/2013 17:18   I reviewed the  images independently and personally and concur with the radiologist's findings.  Course in Urgent Care Center:   She was given Toradol 60 mg IM and Depo-Medrol 80 mg IM.  Assessment:  The encounter diagnosis was Cervical radiculopathy.  Differential diagnosis is rotator cuff tendinitis versus cervical radiculopathy. I favor cervical radiculopathy, since she is painful to palpation all up and down her entire arm. She did have positive impingement signs, but Adson's maneuver was also positive and she does have a history  of degenerative disc disease of her lumbar spine. I have recommended that she followup with her neurosurgeon. She already has meloxicam and cyclobenzaprine at home and I urged her to take this. I did not prescribe oral corticosteroids since she has a history of diabetes. She was provided with a small prescription for hydrocodone.  Plan:   1.  Meds:  The following meds were prescribed:   Discharge Medication List as of 02/08/2013  5:45 PM    START taking these medications   Details  HYDROcodone-acetaminophen (NORCO/VICODIN) 5-325 MG per tablet 1 to 2 tabs every 4 to 6 hours as needed for pain., Print        2.  Patient Education/Counseling:  The patient was given appropriate handouts, self care instructions, and instructed in symptomatic relief.  Given neck and shoulder exercises to start on.  3.  Follow up:  The patient was told to follow up if no better in 3 to 4 days, if becoming worse in any way, and given some red flag symptoms such as worsening pain or any new neurological symptoms which would prompt immediate return.  Follow up with Dr. Trey SailorsMark Roy within the next week.     Reuben Likesavid C Jonas Goh, MD 02/08/13 [phone]

## 2013-02-10 ENCOUNTER — Ambulatory Visit: Payer: No Typology Code available for payment source

## 2013-04-05 ENCOUNTER — Ambulatory Visit: Payer: No Typology Code available for payment source

## 2013-04-18 ENCOUNTER — Telehealth: Payer: Self-pay | Admitting: Psychology

## 2013-04-18 NOTE — Telephone Encounter (Signed)
[name] called to schedule an appointment to get started on a medicine for dementia.  I explained that I don't prescribe medicine and reminded her that I was the therapist she saw.  She stated her former psychiatrist referred her for psychological testing and she was diagnosed with dementia.  The psychiatrist has since retired.  She doesn't have insurance.  Suggested she schedule and appointment with her PCP to discuss.  Strongly encouraged her to bring the report with her to this appointment.  She was able to repeat the plan back to me.

## 2013-04-22 ENCOUNTER — Ambulatory Visit (INDEPENDENT_AMBULATORY_CARE_PROVIDER_SITE_OTHER): Payer: No Typology Code available for payment source | Admitting: Family Medicine

## 2013-04-22 ENCOUNTER — Encounter: Payer: Self-pay | Admitting: Family Medicine

## 2013-04-22 VITALS — BP 147/84 | HR 64 | Ht 65.0 in | Wt 189.0 lb

## 2013-04-22 DIAGNOSIS — R413 Other amnesia: Secondary | ICD-10-CM

## 2013-04-22 NOTE — Progress Notes (Signed)
   Subjective:    Patient ID: [name], female    DOB: [birth year], 63 y.o.   MRN: [id number]  HPI  63 year old female with well-controlled diabetes, severe recurrent depression and hypertension who presents for evaluation of memory loss.  Memory Loss - This has been a persistent problem for several years. When previously addressed with the patient, I was concerned that this was secondary to poorly controlled depression. She has a history of being evaluated in 2008 by a psychologist in [facility] (records brought in today). 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 The patient states that she has most difficulty remembering where she placed items around her apartment. She denies any problems with maintaining her finances poor personal hygiene.This is been going on for several years. She has never taken medication for memory. Overall, she is not taking the sertraline is helping her depression. She is seeing her psychologist, Shelby MattocksMichele Kane, for therapy intermittently.   Current Outpatient Prescriptions on File Prior to Visit  Medication Sig Dispense Refill  . ALPRAZolam (XANAX) 0.5 MG tablet Take 0.5-1 mg by mouth daily.       Marland Kitchen. amLODipine (NORVASC) 5 MG tablet Take 1 tablet (5 mg total) by mouth daily.  30 tablet  11  . aspirin EC 81 MG tablet Take 81 mg by mouth daily.        Marland Kitchen. CALCIUM PO Take 1 tablet by mouth daily.      . cyclobenzaprine (FLEXERIL) 10 MG tablet Take 10 mg by mouth daily.       Marland Kitchen. lisinopril (PRINIVIL,ZESTRIL) 20 MG tablet Take 1 tablet (20 mg total) by mouth daily.  30 tablet  11  . meloxicam (MOBIC) 15 MG tablet Take 1 tablet (15 mg total) by mouth daily.  30 tablet  0  . metFORMIN (GLUCOPHAGE) 500 MG tablet TAKE 1 TABLET (500 MG TOTAL) BY MOUTH EVERY MORNING.  60 tablet  2  . multivitamin-iron-minerals-folic acid (CENTRUM) chewable tablet Chew 1 tablet by mouth daily.      . sertraline  (ZOLOFT) 100 MG tablet Take 2 tablets (200 mg total) by mouth daily.  60 tablet  5  . simvastatin (ZOCOR) 40 MG tablet TAKE 1 TABLET (40 MG TOTAL) BY MOUTH AT BEDTIME.  90 tablet  2  . HYDROcodone-acetaminophen (NORCO) 10-325 MG per tablet Take 1-2 tablets by mouth every 8 (eight) hours as needed. For pain      . mirtazapine (REMERON) 15 MG tablet Take 1 tablet (15 mg total) by mouth at bedtime.  30 tablet  11   No current facility-administered medications on file prior to visit.     Review of Systems Positive for depression, memory loss Negative for fever, chills, headache, stroke like symptoms    Objective:   Physical Exam BP 147/84  Pulse 64  Ht 5\' 5"  (1.651 m)  Wt 189 lb (85.73 kg)  BMI 31.45 kg/m2 Gen: elderly AAF, very pleasant, non ill appearing Neuro: CN II-XII grossly intact, no focal deficits Psych: flat affect, intermittently tearful, attempts to re-direct the conversation towards with   Mini Mental Status - 30/30     Assessment & Plan:

## 2013-04-22 NOTE — Addendum Note (Signed)
Addended by: [place]ORDAN, Mancil Pfenning on: 04/22/2013 12:31 PM   Modules accepted: Orders

## 2013-04-22 NOTE — Patient Instructions (Signed)
[name],   It was great to see you. I am sorry that you are having continued difficulty with your memory. I have an idea for treatment, but we can consider this after I check your blood for HIV, syphillis, vitamin B12 and thyroid issues.   We will do our best to help you feel better.   I will call you with the results.   Sincerely,   Dr. Clinton SawyerWilliamson

## 2013-04-22 NOTE — Assessment & Plan Note (Signed)
Assessment: Persistent memory deficits since the death of her mother that she feels is impairing her ability to complete tasks in her home. 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 Given the fact that she scored 30/30 on her Mini-Mental status today, just as she did in 2008 psychological violation, I doubt whether this is Alzheimer's type type dementia. 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 However she's never been evaluated for treatable causes of dementia. Plan:  - Obtain HIV, RPR, TSH and free T4, vitamin B12 labs - I will likely start her on low-dose Synthroid unless indicated otherwise the lab work - Encourage followup with Dr. Pascal LuxKane for depression

## 2013-04-23 LAB — RPR

## 2013-04-23 LAB — VITAMIN B12: Vitamin B-12: 444 pg/mL (ref 211–911)

## 2013-04-23 LAB — T4, FREE: FREE T4: 0.9 ng/dL (ref 0.80–1.80)

## 2013-04-23 LAB — TSH: TSH: 3.566 u[IU]/mL (ref 0.350–4.500)

## 2013-04-23 LAB — HIV ANTIBODY (ROUTINE TESTING W REFLEX): HIV: NONREACTIVE

## 2013-04-25 ENCOUNTER — Telehealth: Payer: Self-pay | Admitting: Family Medicine

## 2013-04-25 DIAGNOSIS — F322 Major depressive disorder, single episode, severe without psychotic features: Secondary | ICD-10-CM

## 2013-04-25 MED ORDER — LEVOTHYROXINE SODIUM 50 MCG PO TABS: 50.0000 ug | ORAL_TABLET | Freq: Every day | ORAL | Status: DC

## 2013-04-25 NOTE — Telephone Encounter (Signed)
Pt informed that tests for HIV, RPR, b12 and TSH were all normal. I suggested that she start a low dose of thyroid replacement hormone as adjunct treatment for her depression to see how this impacts her memory. She is agreeable and will f/u in 2 weeks for a visit.

## 2013-05-06 ENCOUNTER — Emergency Department (HOSPITAL_COMMUNITY)
Admission: EM | Admit: 2013-05-06 | Discharge: 2013-05-06 | Disposition: A | Discharge status: Home / Self Care | Payer: No Typology Code available for payment source | Attending: Emergency Medicine | Admitting: Emergency Medicine

## 2013-05-06 ENCOUNTER — Encounter (HOSPITAL_COMMUNITY): Payer: Self-pay | Admitting: Emergency Medicine

## 2013-05-06 DIAGNOSIS — H5711 Ocular pain, right eye: Secondary | ICD-10-CM

## 2013-05-06 DIAGNOSIS — Z8719 Personal history of other diseases of the digestive system: Secondary | ICD-10-CM | POA: Insufficient documentation

## 2013-05-06 DIAGNOSIS — H5789 Other specified disorders of eye and adnexa: Secondary | ICD-10-CM | POA: Insufficient documentation

## 2013-05-06 DIAGNOSIS — Z79899 Other long term (current) drug therapy: Secondary | ICD-10-CM | POA: Insufficient documentation

## 2013-05-06 DIAGNOSIS — I10 Essential (primary) hypertension: Secondary | ICD-10-CM | POA: Insufficient documentation

## 2013-05-06 DIAGNOSIS — H5710 Ocular pain, unspecified eye: Secondary | ICD-10-CM | POA: Insufficient documentation

## 2013-05-06 DIAGNOSIS — Z7982 Long term (current) use of aspirin: Secondary | ICD-10-CM | POA: Insufficient documentation

## 2013-05-06 DIAGNOSIS — E119 Type 2 diabetes mellitus without complications: Secondary | ICD-10-CM | POA: Insufficient documentation

## 2013-05-06 DIAGNOSIS — F17200 Nicotine dependence, unspecified, uncomplicated: Secondary | ICD-10-CM | POA: Insufficient documentation

## 2013-05-06 DIAGNOSIS — Z791 Long term (current) use of non-steroidal anti-inflammatories (NSAID): Secondary | ICD-10-CM | POA: Insufficient documentation

## 2013-05-06 DIAGNOSIS — R52 Pain, unspecified: Secondary | ICD-10-CM | POA: Insufficient documentation

## 2013-05-06 MED ORDER — FLUORESCEIN SODIUM 1 MG OP STRP
1.0000 | ORAL_STRIP | Freq: Once | OPHTHALMIC | Status: AC
  Administered 2013-05-06: 1 via OPHTHALMIC
  Filled 2013-05-06: qty 1

## 2013-05-06 MED ORDER — TETRACAINE HCL 0.5 % OP SOLN
1.0000 [drp] | Freq: Once | OPHTHALMIC | Status: AC
  Administered 2013-05-06: 2 [drp] via OPHTHALMIC
  Filled 2013-05-06: qty 2

## 2013-05-06 MED ORDER — ERYTHROMYCIN 5 MG/GM OP OINT: TOPICAL_OINTMENT | Freq: Three times a day (TID) | OPHTHALMIC | Status: DC

## 2013-05-06 MED ORDER — ERYTHROMYCIN 5 MG/GM OP OINT
TOPICAL_OINTMENT | Freq: Three times a day (TID) | OPHTHALMIC | Status: DC
  Administered 2013-05-06: 1 via OPHTHALMIC
  Filled 2013-05-06: qty 1

## 2013-05-06 MED ORDER — KETOROLAC TROMETHAMINE 0.5 % OP SOLN
1.0000 [drp] | Freq: Four times a day (QID) | OPHTHALMIC | Status: DC
  Administered 2013-05-06: 1 [drp] via OPHTHALMIC
  Filled 2013-05-06: qty 3

## 2013-05-06 MED ORDER — KETOROLAC TROMETHAMINE 0.5 % OP SOLN: 1.0000 [drp] | Freq: Four times a day (QID) | OPHTHALMIC | Status: DC

## 2013-05-06 NOTE — ED Notes (Signed)
Dr Otter at bedside  

## 2013-05-06 NOTE — ED Provider Notes (Signed)
CSN: [id number]     Arrival date & time 05/06/13  0550 History   First MD Initiated Contact with Patient 05/06/13 [phone]     Chief Complaint  Patient presents with  . Eye Pain     (Consider location/radiation/quality/duration/timing/severity/associated sxs/prior Treatment) HPI 63 year old female presents to emergency department from home with complaint of right eye pain.  Patient reports she actually scratched her eye 2 days ago.  She began to develop pain to her right superior eye yesterday.  It has gotten progressively worse.  Patient has foreign body sensation in the eye.  She denies any visual changes.  No history of glaucoma.   Past Medical History  Diagnosis Date  . Hypertension   . Diabetes mellitus   . Pancreatitis 2010  . Idiopathic acute pancreatitis 09/16/2011   Past Surgical History  Procedure Laterality Date  . Cholecystectomy  2003  . Spine surgery  2006    Dr. Trey Sailors - L3-L4, L4-L5 laminotomy and foraminotomy   Family History  Problem Relation Age of Onset  . Cancer Mother   . Alcohol abuse Father    History  Substance Use Topics  . Smoking status: Current Every Day Smoker -- 0.75 packs/day    Types: Cigarettes  . Smokeless tobacco: Not on file     Comment: still in stress. looking for job.   . Alcohol Use: No   OB History   Grav Para Term Preterm Abortions TAB SAB Ect Mult Living                 Review of Systems  See History of Present Illness; otherwise all other systems are reviewed and negative   Allergies  Review of patient's allergies indicates no known allergies.  Home Medications   Current Outpatient Rx  Name  Route  Sig  Dispense  Refill  . ALPRAZolam (XANAX) 0.5 MG tablet   Oral   Take 0.5-1 mg by mouth daily.          Marland Kitchen amLODipine (NORVASC) 5 MG tablet   Oral   Take 1 tablet (5 mg total) by mouth daily.   30 tablet   11   . aspirin EC 81 MG tablet   Oral   Take 81 mg by mouth daily.           Marland Kitchen CALCIUM PO   Oral  Take 1 tablet by mouth daily.         . cyclobenzaprine (FLEXERIL) 10 MG tablet   Oral   Take 10 mg by mouth daily.          Marland Kitchen gabapentin (NEURONTIN) 100 MG capsule   Oral   Take 100 mg by mouth 3 (three) times daily.         Marland Kitchen HYDROcodone-acetaminophen (NORCO) 10-325 MG per tablet   Oral   Take 1-2 tablets by mouth every 8 (eight) hours as needed. For pain         . levothyroxine (SYNTHROID, LEVOTHROID) 50 MCG tablet   Oral   Take 1 tablet (50 mcg total) by mouth daily.   30 tablet   2   . lisinopril (PRINIVIL,ZESTRIL) 20 MG tablet   Oral   Take 1 tablet (20 mg total) by mouth daily.   30 tablet   11   . meloxicam (MOBIC) 15 MG tablet   Oral   Take 1 tablet (15 mg total) by mouth daily.   30 tablet   0   . metFORMIN (GLUCOPHAGE) 500 MG  tablet      TAKE 1 TABLET (500 MG TOTAL) BY MOUTH EVERY MORNING.   60 tablet   2     Patient needs a visit before any more refills.   Marland Kitchen. EXPIRED: mirtazapine (REMERON) 15 MG tablet   Oral   Take 1 tablet (15 mg total) by mouth at bedtime.   30 tablet   11   . multivitamin-iron-minerals-folic acid (CENTRUM) chewable tablet   Oral   Chew 1 tablet by mouth daily.         . sertraline (ZOLOFT) 100 MG tablet   Oral   Take 2 tablets (200 mg total) by mouth daily.   60 tablet   5   . simvastatin (ZOCOR) 40 MG tablet      TAKE 1 TABLET (40 MG TOTAL) BY MOUTH AT BEDTIME.   90 tablet   2     CYCLE FILL MEDICATION. Authorization is required f ...   . traMADol (ULTRAM) 50 MG tablet   Oral   Take 50 mg by mouth every 6 (six) hours as needed.          BP 156/84  Pulse 62  Temp(Src) 97.8 F (36.6 C) (Oral)  Resp 14  SpO2 99% Physical Exam  Nursing note and vitals reviewed. Constitutional: She appears well-developed and well-nourished. No distress.  Eyes: Conjunctivae and EOM are normal. Pupils are equal, round, and reactive to light. Lids are everted and swept, no foreign bodies found. Right eye exhibits  discharge (tearing of right eye). Right eye exhibits no chemosis, no exudate and no hordeolum. No foreign body present in the right eye. Left eye exhibits no chemosis, no discharge, no exudate and no hordeolum. No foreign body present in the left eye. Right conjunctiva is not injected. Right conjunctiva has no hemorrhage. Left conjunctiva is not injected. Left conjunctiva has no hemorrhage. No scleral icterus. Right eye exhibits normal extraocular motion and no nystagmus. Left eye exhibits normal extraocular motion and no nystagmus.  Slit lamp exam:      The right eye shows no corneal abrasion, no corneal flare, no corneal ulcer, no foreign body, no hyphema, no hypopyon, no fluorescein uptake and no anterior chamber bulge.       The left eye shows no corneal abrasion, no corneal flare, no corneal ulcer, no foreign body, no hyphema, no hypopyon, no fluorescein uptake and no anterior chamber bulge.  No physical findings on exam to explain patient's symptoms    ED Course  Procedures (including critical care time) Labs Review Labs Reviewed - No data to display Imaging Review No results found.   EKG Interpretation None      MDM   Final diagnoses:  Acute right eye pain    63 year old female with possible corneal abrasion to right eye.  Pain location is under the right eyelid.  Staining does not show any uptake.  Patient reports relief after tetracaine.  Suspect she has a corneal abrasion.  Will start on ketorolac eyedrops and close followup with her eye doctor.    Olivia Mackielga M Vanisha Whiten, MD 05/06/13 [phone]

## 2013-05-06 NOTE — ED Notes (Signed)
Pt reports she scratched her right eye two days ago with her fingernail, pt reports right eye pain with no impairment in her vision. Pt's eye is watering upon assessment. Pt is A&O X4.

## 2013-05-06 NOTE — ED Notes (Signed)
Pt A&OX4, ambulatory at discharge, verbalizing no complaints at this time. 

## 2013-05-06 NOTE — Discharge Instructions (Signed)
Use medications as prescribed for 3 days. Please see your eye doctor for recheck in 1-2 days.  Return to the ER for worsening condition or new concerning symptoms.

## 2013-05-13 ENCOUNTER — Ambulatory Visit (INDEPENDENT_AMBULATORY_CARE_PROVIDER_SITE_OTHER): Payer: No Typology Code available for payment source | Admitting: Family Medicine

## 2013-05-13 ENCOUNTER — Encounter: Payer: Self-pay | Admitting: Family Medicine

## 2013-05-13 VITALS — BP 152/87 | HR 76 | Ht 65.0 in | Wt 183.0 lb

## 2013-05-13 DIAGNOSIS — E119 Type 2 diabetes mellitus without complications: Secondary | ICD-10-CM

## 2013-05-13 DIAGNOSIS — R21 Rash and other nonspecific skin eruption: Secondary | ICD-10-CM

## 2013-05-13 LAB — POCT GLYCOSYLATED HEMOGLOBIN (HGB A1C): Hemoglobin A1C: 6.1

## 2013-05-13 NOTE — Patient Instructions (Signed)
Rash  I am not sure of the cause at this time but fortunately there are no red flags at this time  Reasons to return: fevers/chills/feeling sick overall, expansion of redness/crusting  Treatment: I would try neosporin twice a day for the next 3-7 days (depending on how quickly it gets better)  Thanks for your time today, Dr. Durene CalHunter  You need to see Dr. Clinton SawyerWilliamson soon to discuss:  Health Maintenance Due  Topic Date Due  . Pap Smear  01/08/1969  . Colonoscopy  01/08/2001  . Zostavax  01/09/2011  . Urine Microalbumin  09/17/2012  . Pneumococcal Polysaccharide Vaccine (##2) 01/27/2013  . Foot Exam  02/15/2013  . Hemoglobin A1c  03/02/2013  . Ophthalmology Exam  04/28/2013

## 2013-05-15 NOTE — Progress Notes (Signed)
Shannon ConchStephen Santoria Chason, MD Phone: [phone]  Subjective:   Dolan AmenYvonne M Reid is a 63 y.o. year old very pleasant female patient who presents with the following:  Rash Small area of crusting less than 1/2 cm immediately under nose. Present x 1 week and not growing. Denies recent injury. ALso thinks she has slight area of crusting in her nose. Has not had sick contacts ROS-not ill appearing, no fever/chills. No new medications other than toradol eye drops). Not immunocompromised. No mucus membrane involvement.   Past Medical History- diabetes, depression, chronic back pain  Medications- reviewed and updated Current Outpatient Prescriptions  Medication Sig Dispense Refill  . ALPRAZolam (XANAX) 0.5 MG tablet Take 0.5-1 mg by mouth daily.       Marland Kitchen. amLODipine (NORVASC) 5 MG tablet Take 1 tablet (5 mg total) by mouth daily.  30 tablet  11  . aspirin EC 81 MG tablet Take 81 mg by mouth daily.        Marland Kitchen. CALCIUM PO Take 1 tablet by mouth daily.      . cyclobenzaprine (FLEXERIL) 10 MG tablet Take 10 mg by mouth daily.       Marland Kitchen. gabapentin (NEURONTIN) 100 MG capsule Take 100 mg by mouth 3 (three) times daily.      Marland Kitchen. HYDROcodone-acetaminophen (NORCO) 10-325 MG per tablet Take 1-2 tablets by mouth every 8 (eight) hours as needed. For pain      . levothyroxine (SYNTHROID, LEVOTHROID) 50 MCG tablet Take 1 tablet (50 mcg total) by mouth daily.  30 tablet  2  . lisinopril (PRINIVIL,ZESTRIL) 20 MG tablet Take 1 tablet (20 mg total) by mouth daily.  30 tablet  11  . meloxicam (MOBIC) 15 MG tablet Take 1 tablet (15 mg total) by mouth daily.  30 tablet  0  . metFORMIN (GLUCOPHAGE) 500 MG tablet TAKE 1 TABLET (500 MG TOTAL) BY MOUTH EVERY MORNING.  60 tablet  2  . multivitamin-iron-minerals-folic acid (CENTRUM) chewable tablet Chew 1 tablet by mouth daily.      . sertraline (ZOLOFT) 100 MG tablet Take 2 tablets (200 mg total) by mouth daily.  60 tablet  5  . simvastatin (ZOCOR) 40 MG tablet TAKE 1 TABLET (40 MG TOTAL)  BY MOUTH AT BEDTIME.  90 tablet  2  . traMADol (ULTRAM) 50 MG tablet Take 50 mg by mouth every 6 (six) hours as needed.      . mirtazapine (REMERON) 15 MG tablet Take 1 tablet (15 mg total) by mouth at bedtime.  30 tablet  11   No current facility-administered medications for this visit.    Objective: BP 152/87  Pulse 76  Ht 5\' 5"  (1.651 m)  Wt 183 lb (83.008 kg)  BMI 30.45 kg/m2 Gen: NAD, resting comfortably HEENT: do not appreciate mucus membrane involvement on visualization though patient notes inner nostril area smaller than that under her nose.  CV: RRR no murmurs rubs or gallops Lungs: CTAB no crackles, wheeze, rhonchi Ext: no edema Skin: warm, dry, small less than .5 cm rash under nose.   Assessment/Plan:  Rash Possible small abrasion. GIven crusting, could also be impetigo though I doubt given not spreading or draining. No red flags. Advised trial of triple antibiotic cream and gave reasons for follow up. Lesion does not seem to bother patient a great deal (no pain or itching) and honestly she spent the majority of the time talking about her history of depression and events that were associated with that for which she is followed by  her PCP. Seemed like she wanted someone to talk to today and I was happy to be that for her.   I also saw patient had not had diabetes follow up in 6 months so ordered an a1c which showed good control. Needs to follow up with Dr. Clinton SawyerWilliamson for health maint needs and was advised of this.  Orders Placed This Encounter  Procedures  . POCT glycosylated hemoglobin (Hb A1C)

## 2013-05-19 ENCOUNTER — Telehealth: Payer: Self-pay | Admitting: *Deleted

## 2013-05-19 NOTE — Telephone Encounter (Signed)
Message copied by Radene OuSOMERVILLE, Riti Rollyson L on Thu May 19, 2013 10:09 AM ------      Message from: Shelva MajesticHUNTER, STEPHEN O      Created: Fri May 13, 2013 11:23 AM       Red team- Can we call patient and inform her of results? Also, do not see she scheduled f/u with PCP for DM so can we please try to arrange that? Thanks ------

## 2013-05-19 NOTE — Telephone Encounter (Signed)
LMVM for patient to please call and schedule a follow up with PCP for DM check up.  Also, asked patient to do so at the end of her OV on 05/13/2013.  Radene OuKristen L Christabella Alvira, CMA

## 2013-06-12 ENCOUNTER — Emergency Department (HOSPITAL_COMMUNITY): Payer: Medicaid Other

## 2013-06-12 ENCOUNTER — Encounter (HOSPITAL_COMMUNITY): Payer: Self-pay | Admitting: Emergency Medicine

## 2013-06-12 ENCOUNTER — Emergency Department (HOSPITAL_COMMUNITY)
Admission: EM | Admit: 2013-06-12 | Discharge: 2013-06-12 | Disposition: A | Discharge status: Home / Self Care | Payer: Medicaid Other | Attending: Emergency Medicine | Admitting: Emergency Medicine

## 2013-06-12 DIAGNOSIS — Z79899 Other long term (current) drug therapy: Secondary | ICD-10-CM | POA: Diagnosis not present

## 2013-06-12 DIAGNOSIS — F17200 Nicotine dependence, unspecified, uncomplicated: Secondary | ICD-10-CM | POA: Diagnosis not present

## 2013-06-12 DIAGNOSIS — Z8719 Personal history of other diseases of the digestive system: Secondary | ICD-10-CM | POA: Diagnosis not present

## 2013-06-12 DIAGNOSIS — M436 Torticollis: Secondary | ICD-10-CM | POA: Diagnosis not present

## 2013-06-12 DIAGNOSIS — Z791 Long term (current) use of non-steroidal anti-inflammatories (NSAID): Secondary | ICD-10-CM | POA: Insufficient documentation

## 2013-06-12 DIAGNOSIS — I10 Essential (primary) hypertension: Secondary | ICD-10-CM | POA: Diagnosis not present

## 2013-06-12 DIAGNOSIS — E119 Type 2 diabetes mellitus without complications: Secondary | ICD-10-CM | POA: Diagnosis not present

## 2013-06-12 DIAGNOSIS — M542 Cervicalgia: Secondary | ICD-10-CM | POA: Diagnosis present

## 2013-06-12 DIAGNOSIS — Z7982 Long term (current) use of aspirin: Secondary | ICD-10-CM | POA: Diagnosis not present

## 2013-06-12 MED ORDER — CYCLOBENZAPRINE HCL 10 MG PO TABS: 10.0000 mg | ORAL_TABLET | Freq: Two times a day (BID) | ORAL | Status: DC | PRN

## 2013-06-12 MED ORDER — IBUPROFEN 400 MG PO TABS
600.0000 mg | ORAL_TABLET | Freq: Once | ORAL | Status: AC
  Administered 2013-06-12: 600 mg via ORAL
  Filled 2013-06-12 (×2): qty 1

## 2013-06-12 MED ORDER — DIAZEPAM 5 MG PO TABS
5.0000 mg | ORAL_TABLET | Freq: Once | ORAL | Status: AC
  Administered 2013-06-12: 5 mg via ORAL
  Filled 2013-06-12: qty 1

## 2013-06-12 NOTE — Discharge Instructions (Signed)
Torticollis, Acute °You have suddenly (acutely) developed a twisted neck (torticollis). This is usually a self-limited condition. °CAUSES  °Acute torticollis may be caused by malposition, trauma or infection. Most commonly, acute torticollis is caused by sleeping in an awkward position. Torticollis may also be caused by the flexion, extension or twisting of the neck muscles beyond their normal position. Sometimes, the exact cause may not be known. °SYMPTOMS  °Usually, there is pain and limited movement of the neck. Your neck may twist to one side. °DIAGNOSIS  °The diagnosis is often made by physical examination. X-rays, CT scans or MRIs may be done if there is a history of trauma or concern of infection. °TREATMENT  °For a common, stiff neck that develops during sleep, treatment is focused on relaxing the contracted neck muscle. Medications (including shots) may be used to treat the problem. Most cases resolve in several days. Torticollis usually responds to conservative physical therapy. If left untreated, the shortened and spastic neck muscle can cause deformities in the face and neck. Rarely, surgery is required. °HOME CARE INSTRUCTIONS  °· Use over-the-counter and prescription medications as directed by your caregiver. °· Do stretching exercises and massage the neck as directed by your caregiver. °· Follow up with physical therapy if needed and as directed by your caregiver. °SEEK IMMEDIATE MEDICAL CARE IF:  °· You develop difficulty breathing or noisy breathing (stridor). °· You drool, develop trouble swallowing or have pain with swallowing. °· You develop numbness or weakness in the hands or feet. °· You have changes in speech or vision. °· You have problems with urination or bowel movements. °· You have difficulty walking. °· You have a fever. °· You have increased pain. °MAKE SURE YOU:  °· Understand these instructions. °· Will watch your condition. °· Will get help right away if you are not doing well or  get worse. °Document Released: 01/11/2000 Document Revised: 04/07/2011 Document Reviewed: 02/21/2009 °ExitCare® Patient Information ©2014 [employer]. ° °

## 2013-06-12 NOTE — ED Provider Notes (Signed)
CSN: [id number]     Arrival date & time 06/12/13  0023 History   First MD Initiated Contact with Patient 06/12/13 0110     Chief Complaint  Patient presents with  . Neck Pain     (Consider location/radiation/quality/duration/timing/severity/associated sxs/prior Treatment) HPI Hx per PT - R sided neck pain, went to sleep tonight and woke with R sided neck pain and hurts to move her neck. nop associated weakness or numbness, no trauma, no h/o same. Pain does not radiate, is sore in quality and MOD in severity.  Past Medical History  Diagnosis Date  . Hypertension   . Diabetes mellitus   . Pancreatitis 2010  . Idiopathic acute pancreatitis 09/16/2011   Past Surgical History  Procedure Laterality Date  . Cholecystectomy  2003  . Spine surgery  2006    Dr. Trey SailorsMark Roy - L3-L4, L4-L5 laminotomy and foraminotomy   Family History  Problem Relation Age of Onset  . Cancer Mother   . Alcohol abuse Father    History  Substance Use Topics  . Smoking status: Current Every Day Smoker -- 0.75 packs/day    Types: Cigarettes  . Smokeless tobacco: Not on file     Comment: still in stress. looking for job.   . Alcohol Use: No   OB History   Grav Para Term Preterm Abortions TAB SAB Ect Mult Living                 Review of Systems  Constitutional: Negative for fever and chills.  Eyes: Negative for pain.  Respiratory: Negative for shortness of breath.   Cardiovascular: Negative for chest pain.  Gastrointestinal: Negative for vomiting and abdominal pain.  Genitourinary: Negative for dysuria.  Musculoskeletal: Positive for neck pain and neck stiffness. Negative for back pain.  Skin: Negative for rash.  Neurological: Negative for headaches.  All other systems reviewed and are negative.     Allergies  Review of patient's allergies indicates no known allergies.  Home Medications   Prior to Admission medications   Medication Sig Start Date End Date Taking? Authorizing Provider   ALPRAZolam Prudy Feeler(XANAX) 0.5 MG tablet Take 0.5-1 mg by mouth daily.     Historical Provider, MD  amLODipine (NORVASC) 5 MG tablet Take 1 tablet (5 mg total) by mouth daily. 06/23/12   Garnetta BuddyEdward V Williamson, MD  aspirin EC 81 MG tablet Take 81 mg by mouth daily.      Historical Provider, MD  CALCIUM PO Take 1 tablet by mouth daily.    Historical Provider, MD  cyclobenzaprine (FLEXERIL) 10 MG tablet Take 10 mg by mouth daily.     Historical Provider, MD  gabapentin (NEURONTIN) 100 MG capsule Take 100 mg by mouth 3 (three) times daily.    Historical Provider, MD  HYDROcodone-acetaminophen (NORCO) 10-325 MG per tablet Take 1-2 tablets by mouth every 8 (eight) hours as needed. For pain    Historical Provider, MD  levothyroxine (SYNTHROID, LEVOTHROID) 50 MCG tablet Take 1 tablet (50 mcg total) by mouth daily. 04/25/13   Garnetta BuddyEdward V Williamson, MD  lisinopril (PRINIVIL,ZESTRIL) 20 MG tablet Take 1 tablet (20 mg total) by mouth daily. 08/30/12   Garnetta BuddyEdward V Williamson, MD  meloxicam (MOBIC) 15 MG tablet Take 1 tablet (15 mg total) by mouth daily. 05/12/12   Garnetta BuddyEdward V Williamson, MD  metFORMIN (GLUCOPHAGE) 500 MG tablet TAKE 1 TABLET (500 MG TOTAL) BY MOUTH EVERY MORNING. 12/10/12   Garnetta BuddyEdward V Williamson, MD  mirtazapine (REMERON) 15 MG tablet Take 1  tablet (15 mg total) by mouth at bedtime. 08/09/12 09/08/12  Garnetta BuddyEdward V Williamson, MD  multivitamin-iron-minerals-folic acid (CENTRUM) chewable tablet Chew 1 tablet by mouth daily.    Historical Provider, MD  sertraline (ZOLOFT) 100 MG tablet Take 2 tablets (200 mg total) by mouth daily. 11/10/12   Garnetta BuddyEdward V Williamson, MD  simvastatin (ZOCOR) 40 MG tablet TAKE 1 TABLET (40 MG TOTAL) BY MOUTH AT BEDTIME. 02/02/13   Garnetta BuddyEdward V Williamson, MD  traMADol (ULTRAM) 50 MG tablet Take 50 mg by mouth every 6 (six) hours as needed.    Historical Provider, MD   BP 139/84  Pulse 78  Temp(Src) 98.7 F (37.1 C) (Oral)  Resp 14  SpO2 100% Physical Exam  Nursing note and vitals  reviewed. Constitutional: She is oriented to person, place, and time. She appears well-developed and well-nourished.  HENT:  Head: Normocephalic and atraumatic.  Mouth/Throat: Oropharynx is clear and moist.  Eyes: EOM are normal. Pupils are equal, round, and reactive to light.  Neck: No tracheal deviation present.  TTP R cervical/ paraspinal with muscle spasm, some midline tenderness but no deformity. Equal grips/ bicep/ tricep strengths.  Sensory to light touch intact to UEs  Cardiovascular: Normal rate, regular rhythm and intact distal pulses.   Pulmonary/Chest: Effort normal and breath sounds normal. No stridor. No respiratory distress.  Abdominal: Soft. There is no tenderness.  Musculoskeletal: Normal range of motion. She exhibits no edema.  Neurological: She is alert and oriented to person, place, and time.  Skin: Skin is warm and dry.    ED Course  Procedures (including critical care time) Labs Review Labs Reviewed - No data to display  Imaging Review Dg Cervical Spine Complete  06/12/2013   CLINICAL DATA:  Neck pain.  No reported injury.  EXAM: CERVICAL SPINE  4+ VIEWS  COMPARISON:  None.  FINDINGS: Reversal of the normal cervical lordosis. Multilevel degenerative changes. Moderate-to-marked foraminal stenosis on the right at the C4-5 level and mild to moderate foraminal stenosis on the right at the C5-6 level. The foramina on the left are difficult to evaluate due to lack of sufficient obliquity. No fractures or subluxations are seen.  IMPRESSION: Multilevel degenerative changes.   Electronically Signed   By: Gordan PaymentSteve  Reid M.D.   On: 06/12/2013 03:26    Valium and Motrin and ice provided. 4:50 AM on recheck her pain has improved she is able to move her neck much more freely. She will be discharged home with prescription for Flexeril and followup with her primary care physician. She agrees to return precautions and is stable for discharge at this time.  MDM   Diagnosis:  Torticollis  Evaluated with x-rays above. Improved with time Motrin and ice. Vital signs nursing notes reviewed and considered.    Sunnie NielsenBrian Alecsander Hattabaugh, MD 06/12/13 [phone]

## 2013-06-12 NOTE — ED Notes (Signed)
Neck pain; pain radiating down rt. Arm.  Chronic back pain.

## 2013-06-14 ENCOUNTER — Other Ambulatory Visit: Payer: Self-pay | Admitting: Family Medicine

## 2013-06-15 ENCOUNTER — Ambulatory Visit: Payer: No Typology Code available for payment source | Admitting: Family Medicine

## 2013-07-08 ENCOUNTER — Ambulatory Visit: Payer: No Typology Code available for payment source | Admitting: Family Medicine

## 2013-07-12 ENCOUNTER — Ambulatory Visit (INDEPENDENT_AMBULATORY_CARE_PROVIDER_SITE_OTHER): Payer: No Typology Code available for payment source | Admitting: Family Medicine

## 2013-07-12 ENCOUNTER — Encounter: Payer: Self-pay | Admitting: Family Medicine

## 2013-07-12 VITALS — BP 122/75 | HR 67 | Ht 65.0 in | Wt 182.0 lb

## 2013-07-12 DIAGNOSIS — M6530 Trigger finger, unspecified finger: Secondary | ICD-10-CM

## 2013-07-12 MED ORDER — METHYLPREDNISOLONE ACETATE 40 MG/ML IJ SUSP
10.0000 mg | Freq: Once | INTRAMUSCULAR | Status: AC
  Administered 2013-07-12: 10 mg via INTRA_ARTICULAR

## 2013-07-12 NOTE — Patient Instructions (Signed)
Trigger Finger Trigger finger (digital tendinitis and stenosing tenosynovitis) is a common disorder that causes an often painful catching of the fingers or thumb. It occurs as a clicking, snapping, or locking of a finger in the palm of the hand. This is caused by a problem with the tendons that flex or bend the fingers sliding smoothly through their sheaths. The condition may occur in any finger or a couple fingers at the same time.  The finger may lock with the finger curled or suddenly straighten out with a snap. This is more common in patients with rheumatoid arthritis and diabetes. Left untreated, the condition may get worse to the point where the finger becomes locked in flexion, like making a fist, or less commonly locked with the finger straightened out. CAUSES   Inflammation and scarring that lead to swelling around the tendon sheath.  Repeated or forceful movements.  Rheumatoid arthritis, an autoimmune disease that affects joints.  Gout.  Diabetes mellitus. SIGNS AND SYMPTOMS  Soreness and swelling of your finger.  A painful clicking or snapping as you bend and straighten your finger. DIAGNOSIS  Your health care provider will do a physical exam of your finger to diagnose trigger finger. TREATMENT   Splinting for 6 8 weeks may be helpful.  Nonsteroidal anti-inflammatory medicines (NSAIDs) can help to relieve the pain and inflammation.  Cortisone injections, along with splinting, may speed up recovery. Several injections may be required. Cortisone may give relief after one injection.  Surgery is another treatment that may be used if conservative treatments do not work. Surgery can be minor, without incisions (a cut does not have to be made), and can be done with a needle through the skin.  Other surgical choices involve an open procedure in which the surgeon opens the hand through a small incision and cuts the pulley so the tendon can again slide smoothly. Your hand will still  work fine. HOME CARE INSTRUCTIONS  Apply ice to the injured area, twice per day:  Put ice in a plastic bag.  Place a towel between your skin and the bag.  Leave the ice on for 20 minutes, 3 4 times a day.  Rest your hand often. MAKE SURE YOU:   Understand these instructions.  Will watch your condition.  Will get help right away if you are not doing well or get worse. Document Released: 11/03/2003 Document Revised: 09/15/2012 Document Reviewed: 06/15/2012 [facility] Patient Information 2014 [place], [place].  Wear your finger brace for the next 3 days, gradually increase range of motion over the next 1-2 weeks. Please return if symptoms do not resolve.

## 2013-07-12 NOTE — Progress Notes (Signed)
   Subjective:    Patient ID: [name], female    DOB: 05/01/1950, 63 y.o.   MRN: [id number]  HPI 63 year old female presents for evaluation of trigger finger of the right ring finger, patient has previously been evaluated for this and underwent steroid injection in September of 2014, patient had relief of her symptoms at that time, she reports recurrence of her symptoms including not being able to extend her right ring finger after flexion, she has pain in the palm at the proximal aspect of the right ring finger, symptoms have only been present for the past week, she has been wearing a finger splint that was provided to her at previous visits, she has not been icing the area, she has been taking Norco which he takes for other medical issues   Review of Systems  Constitutional: Negative for fever, chills and fatigue.       Objective:   Physical Exam Vitals: Reviewed MSK: Tenderness and palpable nodule at the distal Palmer crease proximal to the right ring finger, patient is able to fully flex all digits of the ring finger however has popping with extension  Procedure note:  Trigger finger injection of right ring finger, risks and benefits of the procedure were discussed with the patient, written consent was obtained, the affected area was cleaned in a sterile fashion using alcohol swabs, a 5/8 inch 25-gauge needle was used to inject 10 mg of Depo-Medrol and 0.25 cc Lidocaine without epinephrine into the right ring finger flexor tendon at the distal palmar crease, patient tolerated the procedure well, no complications, the injected area was covered with a Band-Aid       Assessment & Plan:  Please see problem specific assessment and plan.

## 2013-07-12 NOTE — Assessment & Plan Note (Signed)
Patient has had recurrence of right ring finger trigger finger -Patient elected to repeat steroid injection today which was performed as outlined in the procedure note -Patient counseled that if symptoms do not improve she may need referral to orthopedic hand surgery

## 2013-08-17 ENCOUNTER — Other Ambulatory Visit: Payer: Self-pay | Admitting: *Deleted

## 2013-08-17 DIAGNOSIS — I10 Essential (primary) hypertension: Secondary | ICD-10-CM

## 2013-08-18 MED ORDER — MIRTAZAPINE 15 MG PO TABS: 15.0000 mg | ORAL_TABLET | Freq: Every day | ORAL | Status: DC

## 2013-08-18 MED ORDER — AMLODIPINE BESYLATE 5 MG PO TABS: 5.0000 mg | ORAL_TABLET | Freq: Every day | ORAL | Status: DC

## 2013-09-23 ENCOUNTER — Encounter (HOSPITAL_COMMUNITY): Payer: Self-pay | Admitting: Emergency Medicine

## 2013-09-23 ENCOUNTER — Emergency Department (HOSPITAL_COMMUNITY)
Admission: EM | Admit: 2013-09-23 | Discharge: 2013-09-24 | Disposition: A | Discharge status: Home / Self Care | Payer: Medicaid Other | Attending: Emergency Medicine | Admitting: Emergency Medicine

## 2013-09-23 ENCOUNTER — Ambulatory Visit (INDEPENDENT_AMBULATORY_CARE_PROVIDER_SITE_OTHER): Payer: No Typology Code available for payment source | Admitting: Family Medicine

## 2013-09-23 VITALS — BP 139/71 | HR 71 | Temp 98.2°F | Ht 65.0 in | Wt 186.0 lb

## 2013-09-23 DIAGNOSIS — M79642 Pain in left hand: Secondary | ICD-10-CM

## 2013-09-23 DIAGNOSIS — E119 Type 2 diabetes mellitus without complications: Secondary | ICD-10-CM | POA: Insufficient documentation

## 2013-09-23 DIAGNOSIS — M7989 Other specified soft tissue disorders: Secondary | ICD-10-CM | POA: Diagnosis not present

## 2013-09-23 DIAGNOSIS — F17200 Nicotine dependence, unspecified, uncomplicated: Secondary | ICD-10-CM | POA: Diagnosis not present

## 2013-09-23 DIAGNOSIS — Z7982 Long term (current) use of aspirin: Secondary | ICD-10-CM | POA: Insufficient documentation

## 2013-09-23 DIAGNOSIS — M79609 Pain in unspecified limb: Secondary | ICD-10-CM | POA: Diagnosis present

## 2013-09-23 DIAGNOSIS — Z791 Long term (current) use of non-steroidal anti-inflammatories (NSAID): Secondary | ICD-10-CM | POA: Insufficient documentation

## 2013-09-23 DIAGNOSIS — M6530 Trigger finger, unspecified finger: Secondary | ICD-10-CM

## 2013-09-23 DIAGNOSIS — Z8719 Personal history of other diseases of the digestive system: Secondary | ICD-10-CM | POA: Diagnosis not present

## 2013-09-23 DIAGNOSIS — Z79899 Other long term (current) drug therapy: Secondary | ICD-10-CM | POA: Insufficient documentation

## 2013-09-23 DIAGNOSIS — I10 Essential (primary) hypertension: Secondary | ICD-10-CM | POA: Insufficient documentation

## 2013-09-23 HISTORY — DX: Trigger finger, unspecified finger: M65.30

## 2013-09-23 NOTE — ED Notes (Signed)
Pt reports she has a hx of trigger finger, was treated at [facility] practive today and was given an injection in her hand - pt states the physician stated he initially injected her hand w/ the wrong medication. Pt c/o increased pain, redness and swelling.

## 2013-09-23 NOTE — Patient Instructions (Signed)
Thank you for coming to see me today. It was a pleasure. Today we talked about:   Trigger finger: I gave you an injection of steroid and lidocaine. This should improve your symptoms. If not, we may need to consider alternative treatments  Please make an appointment to see Dr. Jordan Likes within 2-4 weeks  If you have any questions or concerns, please do not hesitate to call the office at [phone].  Sincerely,  Jacquelin Hawking, MD

## 2013-09-23 NOTE — Assessment & Plan Note (Signed)
Patient with left ring finger trigger finger. Seems she has had recurrence of right ring finger. Reiterated that if symptoms not improved, she may need referral for sports medicine vs orthopedic surgery. Follow-up with PCP when appropriate.

## 2013-09-23 NOTE — ED Provider Notes (Signed)
CSN: [id number]     Arrival date & time 09/23/13  2215 History  This chart was scribed for non-physician practitioner, Antony Madura, PA-C working with Vida Roller, MD by Luisa Dago, ED scribe. This patient was seen in room WTR9/WTR9 and the patient's care was started at 11:45 PM.    Chief Complaint  Patient presents with  . Hand Problem   The history is provided by the patient. No language interpreter was used.   HPI Comments: [name] is a 63 y.o. female with a hx of trigger finger to her right hand, presents to the Emergency Department complaining of left hand pain and swelling that started today. Pt states that she was seen at [facility] earlier today for pain to her left ring finger. Today, she states that while getting an injection she was told that she was given the wrong medication. Following that, he went to get the correct medication and injected her with it. When she came home she had no pain, but now her left hand is swollen and with "throbbing" pain. She states that her current pain is worse than what it was before the injection. Pt is requesting a blood test to identify the drug that was given to her. She denies taking any OTC medication.    Past Medical History  Diagnosis Date  . Hypertension   . Diabetes mellitus   . Pancreatitis 2010  . Idiopathic acute pancreatitis 09/16/2011  . Trigger finger    Past Surgical History  Procedure Laterality Date  . Cholecystectomy  2003  . Spine surgery  2006    Dr. Trey Sailors - L3-L4, L4-L5 laminotomy and foraminotomy   Family History  Problem Relation Age of Onset  . Cancer Mother   . Alcohol abuse Father    History  Substance Use Topics  . Smoking status: Current Every Day Smoker -- 0.50 packs/day    Types: Cigarettes  . Smokeless tobacco: Not on file     Comment: still in stress. looking for job.   . Alcohol Use: No   OB History   Grav Para Term Preterm Abortions TAB SAB Ect Mult Living                 Review  of Systems  Musculoskeletal: Positive for arthralgias and joint swelling.  All other systems reviewed and are negative.  Allergies  Review of patient's allergies indicates no known allergies.  Home Medications   Prior to Admission medications   Medication Sig Start Date End Date Taking? Authorizing Provider  ALPRAZolam Prudy Feeler) 0.5 MG tablet Take 0.5 mg by mouth daily.    Yes Historical Provider, MD  amLODipine (NORVASC) 5 MG tablet Take 5 mg by mouth daily.   Yes Historical Provider, MD  aspirin EC 81 MG tablet Take 81 mg by mouth daily.     Yes Historical Provider, MD  CALCIUM PO Take 1 tablet by mouth daily.   Yes Historical Provider, MD  cyclobenzaprine (FLEXERIL) 10 MG tablet Take 10 mg by mouth at bedtime. For back pain   Yes Historical Provider, MD  gabapentin (NEURONTIN) 100 MG capsule Take 100 mg by mouth 3 (three) times daily.   Yes Historical Provider, MD  levothyroxine (SYNTHROID, LEVOTHROID) 50 MCG tablet Take 50 mcg by mouth daily.   Yes Historical Provider, MD  lisinopril (PRINIVIL,ZESTRIL) 20 MG tablet Take 20 mg by mouth daily.   Yes Historical Provider, MD  meloxicam (MOBIC) 15 MG tablet Take 15 mg by mouth  daily.   Yes Historical Provider, MD  metFORMIN (GLUCOPHAGE) 500 MG tablet Take 500 mg by mouth daily.   Yes Historical Provider, MD  multivitamin-iron-minerals-folic acid (CENTRUM) chewable tablet Chew 1 tablet by mouth daily.   Yes Historical Provider, MD  sertraline (ZOLOFT) 100 MG tablet Take 100 mg by mouth daily.   Yes Historical Provider, MD  simvastatin (ZOCOR) 40 MG tablet Take 40 mg by mouth daily.   Yes Historical Provider, MD  traMADol (ULTRAM) 50 MG tablet Take 50 mg by mouth every 6 (six) hours as needed for moderate pain.    Yes Historical Provider, MD  cephALEXin (KEFLEX) 500 MG capsule Take 1 capsule (500 mg total) by mouth 4 (four) times daily. 09/24/13   Antony Madura, PA-C  HYDROcodone-acetaminophen (NORCO) 10-325 MG per tablet Take 1 tablet by mouth  every 6 (six) hours as needed. For pain 09/24/13   Antony Madura, PA-C  mirtazapine (REMERON) 15 MG tablet Take 1 tablet (15 mg total) by mouth at bedtime.  09/16/13  Myra Rude, MD   Triage vitals:BP 163/88  Pulse 73  Temp(Src) 98.6 F (37 C) (Oral)  Resp 16  Ht  (1.651 m)  Wt 180 lb (81.647 kg)  BMI 29.95 kg/m2  SpO2 96%  Physical Exam  Nursing note and vitals reviewed. Constitutional: She is oriented to person, place, and time. She appears well-developed and well-nourished. No distress.  Nontoxic/nonseptic appearing.  HENT:  Head: Normocephalic and atraumatic.  Eyes: Conjunctivae and EOM are normal. No scleral icterus.  Neck: Normal range of motion.  Cardiovascular: Normal rate, regular rhythm and intact distal pulses.   Distal radial pulse 2+ b/l. Capillary refill brisk in all digits.  Pulmonary/Chest: Effort normal. No respiratory distress.  Musculoskeletal: She exhibits tenderness.       Left hand: She exhibits tenderness. She exhibits normal range of motion, no bony tenderness, normal two-point discrimination, normal capillary refill and no deformity. Normal sensation noted.       Hands: Evidence of recent puncture wound to palmar aspect of L hand, proximal to L 4th digit. TTP at puncture wound site. Mild soft tissue swelling. No heat to touch or red linear streaking. No marked erythema; there is mild erythema, but same anatomy appreciated on R hand.  Neurological: She is alert and oriented to person, place, and time. She exhibits normal muscle tone. Coordination normal.  No gross sensory deficits appreciated. Patient able to wiggle all fingers of L hand.  Skin: Skin is warm and dry. No rash noted. She is not diaphoretic. No erythema. No pallor.  Psychiatric: She has a normal mood and affect. Her behavior is normal.    ED Course  Procedures (including critical care time)  DIAGNOSTIC STUDIES: Oxygen Saturation is 96% on RA, adequate by my interpretation.     COORDINATION OF CARE: 11:59 PM- Advised pt that a blood test to see what medication she was injected with is not available. Will give pt a referral to a hand specialist on call. Will also prescribe Keflex. Pt advised of plan for treatment and pt agrees.  Labs Review Labs Reviewed - No data to display  Imaging Review No results found.   EKG Interpretation None      MDM   Final diagnoses:  Left hand pain  Swelling of left hand    63 year old female presents to the emergency department for further evaluation of left hand pain. Patient with tenderness at site where her PCP inject Depo-Medrol and lidocaine for relief of  her trigger finger. Patient with punctate puncture wound proximal to her left fourth digit consistent with procedure earlier this afternoon. Patient has tenderness at the site with mild associated swelling. No heat to touch, erythema, or red linear streaking. Capillary refill is intact no digits. Patient noted to have normal range of motion of all digits of left hand.  Patient specifically requesting a blood test to find out what medications were injected into her hand this afternoon. She states that her primary doctor told her that he initially injected her hand with or in medication. I reviewed the primary doctor's note which makes no mention of improper injection of medication. I have explained to the patient that there is no test to determine what medications were used during this procedure. Patient verbalizes understanding. Have advised pain control with naproxen and Norco as needed for severe pain. Have also recommended that the patient contact her primary doctor on Monday to discuss the procedure and to schedule followup if symptoms persist. No evidence of acute infection, but given patient's concern will initiate small dose of Keflex to cover for this. Patient stable for discharge at this time. Return precautions discussed and provided. Patient agreeable to plan with no  unaddressed concerns.  I personally performed the services described in this documentation, which was scribed in my presence. The recorded information has been reviewed and is accurate.   Filed Vitals:   09/23/13 2234 09/24/13 0015  BP: 163/88 164/97  Pulse: 73 71  Temp: 98.6 F (37 C)   TempSrc: Oral   Resp: 16 16  Height:  (1.651 m)   Weight: 180 lb (81.647 kg)   SpO2: 96% 98%      Antony Madura, PA-C 09/24/13 0422

## 2013-09-23 NOTE — Progress Notes (Signed)
    Subjective   [name] is a 63 y.o. female that presents for a same day visit  1. Finger pain: Symptoms started three weeks ago. Has history of trigger finger on right hand that was treated with steroid injection. This time, the fourth digit of left hand is affect. There is associated pain. Her finger gets stuck in flexion when she tries to extend fingers. Right hand is dominant. No fevers. No other joint pain.  History  Substance Use Topics  . Smoking status: Current Every Day Smoker -- 0.50 packs/day    Types: Cigarettes  . Smokeless tobacco: Not on file     Comment: still in stress. looking for job.   . Alcohol Use: No    ROS Per HPI  Objective   BP 139/71  Pulse 71  Temp(Src) 98.2 F (36.8 C) (Oral)  Ht  (1.651 m)  Wt 186 lb (84.369 kg)  BMI 30.95 kg/m2  General: Well appearing Extremities: Nodule palpated just proximal to 4th MCP joint of left hand. Patient with mild tenderness in area. Upon attempted extension after flexion, fourth finger requires applied force to extend. Neuro: Sensation intact  Procedure note:  Trigger finger injection of left fourth digit, risks and benefits of the procedure were discussed with the patient, written consent was obtained, the affected area was cleaned in a sterile fashion using alcohol swabs, a 1-1/2 inch 21-gauge needle was used to inject 20 mg of Depo-Medrol and 0.5 cc Lidocaine without epinephrine into the left fourth digit flexor tendon at the distal palmar crease, patient tolerated the procedure well, no complications, the injected area was covered with a Band-Aid   Assessment and Plan   Please refer to problem based charting of assessment and plan

## 2013-09-23 NOTE — ED Notes (Signed)
Bed: WTR9 Expected date:  Expected time:  Means of arrival:  Comments: 

## 2013-09-24 ENCOUNTER — Emergency Department (HOSPITAL_COMMUNITY)
Admission: EM | Admit: 2013-09-24 | Discharge: 2013-09-24 | Disposition: A | Discharge status: Home / Self Care | Payer: Medicaid Other | Attending: Emergency Medicine | Admitting: Emergency Medicine

## 2013-09-24 ENCOUNTER — Encounter (HOSPITAL_COMMUNITY): Payer: Self-pay | Admitting: Emergency Medicine

## 2013-09-24 DIAGNOSIS — M6530 Trigger finger, unspecified finger: Secondary | ICD-10-CM | POA: Insufficient documentation

## 2013-09-24 DIAGNOSIS — M79609 Pain in unspecified limb: Secondary | ICD-10-CM | POA: Insufficient documentation

## 2013-09-24 MED ORDER — CEPHALEXIN 500 MG PO CAPS: 500.0000 mg | ORAL_CAPSULE | Freq: Four times a day (QID) | ORAL | Status: DC

## 2013-09-24 MED ORDER — HYDROCODONE-ACETAMINOPHEN 5-325 MG PO TABS
2.0000 | ORAL_TABLET | Freq: Once | ORAL | Status: AC
  Administered 2013-09-24: 2 via ORAL
  Filled 2013-09-24: qty 2

## 2013-09-24 MED ORDER — HYDROCODONE-ACETAMINOPHEN 10-325 MG PO TABS: 1.0000 | ORAL_TABLET | Freq: Four times a day (QID) | ORAL | Status: DC | PRN

## 2013-09-24 MED ORDER — MELOXICAM 15 MG PO TABS: 15.0000 mg | ORAL_TABLET | Freq: Every day | ORAL | Status: DC

## 2013-09-24 NOTE — Discharge Instructions (Signed)
Rest and, elevate, and apply ice. Followup with hand specialist next week.

## 2013-09-24 NOTE — ED Notes (Signed)
Pt ambulating independently w/ steady gait on d/c in no acute distress, A&Ox4. Rx given x2  

## 2013-09-24 NOTE — ED Provider Notes (Signed)
  Chief Complaint   Chief Complaint  Patient presents with  . Hand Pain    History of Present Illness   [name] is a 63 year old female who went to the family medicine clinic yesterday for a trigger finger of her left ring finger. She received 2 injections into the base of the finger. The patient states that the first injection was given an air and it was the wrong medicine. She was then given another injection. Reading the note there is only notation 1 injection being given which is Depo-Medrol and lidocaine. The patient went to the emergency room last night and got a prescription for pain, but it still hurting. She wants blood testing done to find out what the initial substance was that was given to her. She's wearing a splint on her finger and has pain with movement.  Review of Systems   Other than as noted above, the patient denies any of the following symptoms: Systemic:  No fevers or chills. Musculoskeletal:  No joint pain or arthritis.  Neurological:  No muscular weakness or paresthesias.  PMFSH   Past medical history, family history, social history, meds, and allergies were reviewed.     Physical Examination   Vital signs:  BP 150/97  Pulse 63  Temp(Src) 98.1 F (36.7 C) (Oral)  Resp 18  Ht  (1.651 m)  Wt 180 lb (81.647 kg)  BMI 29.95 kg/m2  SpO2 99% Gen:  Alert and oriented times 3.  In no distress. Musculoskeletal:  Exam of the hand reveals there is no swelling, erythema, or heat. The entire finger extending to the base of the palm it is very painful.  Otherwise, all joints had a full a ROM with no swelling, bruising or deformity.  No edema, pulses full. Extremities were warm and pink.  Capillary refill was brisk.  Skin:  Clear, warm and dry.  No rash. Neuro:  Alert and oriented times 3.  Muscle strength was normal.  Sensation was intact to light touch.   Assessment   The encounter diagnosis was Trigger finger.  There is no evidence of infection. This  may just be a steroid flare. There is no evidence in the chart that she was injected with any raw or abnormal medication.  Plan  1.  Meds:  The following meds were prescribed:   Discharge Medication List as of 09/24/2013 10:13 AM    START taking these medications   Details  !! meloxicam (MOBIC) 15 MG tablet Take 1 tablet (15 mg total) by mouth daily., Starting 09/24/2013, Until Discontinued, Print     !! - Potential duplicate medications found. Please discuss with provider.      2.  Patient Education/Counseling:  The patient was given appropriate handouts, self care instructions, and instructed in symptomatic relief, including rest and activity, and elevation. She was encouraged to get the prescriptions filled for Percocet and Keflex that she was given at the emergency room last night.  3.  Follow up:  The patient was told to follow up here if no better in 3 to 4 days, or sooner if becoming worse in any way, and given some red flag symptoms such as worsening pain, fever, swelling, or neurological symptoms which would prompt immediate return.  Followup with her primary care physician on Monday.      Reuben Likes, MD 09/24/13 [id number]

## 2013-09-24 NOTE — ED Notes (Addendum)
Pt c/o L ring finger being "locked".  Pt reports pain 10/10.  Pt reports she was seen yesterday at [facility].  Pt states the MD injected something in her finger and then stated it was the wrong med.  MD injected another med.  Pt unsure what was injected.  Pt went to [facility] last night with c/o worsening pain and now hand is red and swollen.  Pt has hx of trigger finger.

## 2013-09-24 NOTE — Discharge Instructions (Signed)

## 2013-09-24 NOTE — ED Provider Notes (Signed)
Medical screening examination/treatment/procedure(s) were performed by non-physician practitioner and as supervising physician I was immediately available for consultation/collaboration.    Andoni Busch D Jaedan Schuman, MD 09/24/13 0651 

## 2013-09-26 ENCOUNTER — Encounter: Payer: Self-pay | Admitting: Family Medicine

## 2013-09-26 MED ORDER — METHYLPREDNISOLONE ACETATE 40 MG/ML IJ SUSP
40.0000 mg | Freq: Once | INTRAMUSCULAR | Status: AC
  Administered 2013-09-23: 40 mg via INTRA_ARTICULAR

## 2013-09-26 MED ORDER — METHYLPREDNISOLONE ACETATE 40 MG/ML IJ SUSP
20.0000 mg | Freq: Once | INTRAMUSCULAR | Status: AC
  Administered 2013-09-26: 20 mg via INTRA_ARTICULAR

## 2013-09-26 NOTE — Addendum Note (Signed)
Addended by: Jone Baseman D on: 09/26/2013 11:14 AM   Modules accepted: Orders

## 2013-09-26 NOTE — Addendum Note (Signed)
Addended by: Jone Baseman D on: 09/26/2013 11:50 AM   Modules accepted: Orders

## 2013-09-27 NOTE — Progress Notes (Signed)
Patient ID: [name], female   DOB: 02-Mar-1950, 63 y.o.   MRN: [id number]  Patient presented to the office on 09/26/2013 because of a complaint regarding procedure last week. States that she thought I put the incorrect medication into her hand. She also had concerns because she developed swelling and redness at the area of injection. She was seen in the ED for this and given medication for possible infection. I spoke to the patient and explained to her the events of our procedure visit. I had stepped out of the room to get a larger gauge needle so the medication could be pushed faster. Patient voiced understanding and was satisfied with my explanation of events. Patient elects to be reevaluated by Dr. Randolm Idol, who did her first injection.

## 2013-10-05 ENCOUNTER — Other Ambulatory Visit: Payer: Self-pay | Admitting: *Deleted

## 2013-10-07 ENCOUNTER — Telehealth: Payer: Self-pay | Admitting: Family Medicine

## 2013-10-07 MED ORDER — LISINOPRIL 20 MG PO TABS: 20.0000 mg | ORAL_TABLET | Freq: Every day | ORAL | Status: DC

## 2013-10-07 NOTE — Telephone Encounter (Signed)
Refilled rx and patient informed

## 2013-10-07 NOTE — Telephone Encounter (Signed)
Pt called because she said the pharmacy has been sending faxes for a refill request on her Lisinopril. Can we call this in please. jw

## 2013-10-10 ENCOUNTER — Ambulatory Visit: Payer: Self-pay | Admitting: Family Medicine

## 2013-10-13 ENCOUNTER — Ambulatory Visit: Payer: Self-pay

## 2013-10-17 ENCOUNTER — Ambulatory Visit (INDEPENDENT_AMBULATORY_CARE_PROVIDER_SITE_OTHER): Payer: Self-pay | Admitting: Family Medicine

## 2013-10-17 ENCOUNTER — Encounter: Payer: Self-pay | Admitting: Family Medicine

## 2013-10-17 VITALS — BP 146/70 | HR 71 | Temp 98.6°F | Ht 65.0 in | Wt 183.6 lb

## 2013-10-17 DIAGNOSIS — E119 Type 2 diabetes mellitus without complications: Secondary | ICD-10-CM

## 2013-10-17 DIAGNOSIS — M6530 Trigger finger, unspecified finger: Secondary | ICD-10-CM

## 2013-10-17 DIAGNOSIS — I10 Essential (primary) hypertension: Secondary | ICD-10-CM

## 2013-10-17 DIAGNOSIS — E039 Hypothyroidism, unspecified: Secondary | ICD-10-CM

## 2013-10-17 LAB — TSH: TSH: 3.106 u[IU]/mL (ref 0.350–4.500)

## 2013-10-17 LAB — POCT GLYCOSYLATED HEMOGLOBIN (HGB A1C): Hemoglobin A1C: 6

## 2013-10-17 NOTE — Assessment & Plan Note (Signed)
Left ring trigger finger, not resolved with steroid injection on 09/23/13 -discussed with patient that too soon to have repeat steroid injection, talked about waiting 3 months to repeat steroid injection vs. Referral to hand surgery for possible surgical correction, Patient elected to wait until late November for repeat evalaution

## 2013-10-17 NOTE — Patient Instructions (Addendum)
Left trigger finger - continue to wear splint as tolerated, ice daily, take Tramadol as needed for pain, if symptoms persist return in late November/early December for repeat finger injection  ?thyroid disease - recheck thyroid hormone today  Blood pressure - mildly elevated, continue to monitor at subsequent visit.

## 2013-10-17 NOTE — Assessment & Plan Note (Signed)
Needs appointment for follow up of diabetes -check A1C today -schedule appointment with PCP to discuss diabetes

## 2013-10-17 NOTE — Assessment & Plan Note (Signed)
Mildly elevated. -no changes to medications today -will follow at subsequent visits

## 2013-10-17 NOTE — Progress Notes (Signed)
   Subjective:    Patient ID: [name], female    DOB: July 23, 1950, 63 y.o.   MRN: [id number]  HPI 63 y/o female presents for follow up of left ring trigger finger.  Left ring trigger finger - had steroid injection on 09/23/13, developed redness/swelling in the area, was seen in ED x2 in the next 24 hours, symptoms were consistent with steroid flair, treated with NSAID's/Norco, redness/swelling resolved, still having triggering of the left ring finger, wearing splint daily, icing daily, taking PRN tramadol  On review of medical record synthroid is listed, patient denies history of hypothyroidism and has not been taking synthroid  HTN - taking Norvasc and Lisinopril, no missed doses, no side effects, no chest pain, no lightheadedness   Review of Systems  Constitutional: Negative for fever, chills and fatigue.  Respiratory: Negative for shortness of breath.   Cardiovascular: Negative for chest pain.       Objective:   Physical Exam Vitals: reviewed Gen: pleasant AAF, NAD HEENT: pupils equal bilaterally, no scleral icterus, MMM Cardiac: RRR, S1 and S2 present, no murmurs, no heaves/thrills Resp: CTAB, normal effort MSK: triggering of left ring finger on extension, no erythema, mild swelling on palmar aspect over distal left ring finger  TSH 03/2013 wnl     Assessment & Plan:  Please see problem specific assessment and plan.

## 2013-10-18 ENCOUNTER — Telehealth: Payer: Self-pay | Admitting: Family Medicine

## 2013-10-18 NOTE — Telephone Encounter (Signed)
Discussed lab results with patient. 

## 2013-11-01 ENCOUNTER — Other Ambulatory Visit: Payer: Self-pay | Admitting: *Deleted

## 2013-11-02 ENCOUNTER — Other Ambulatory Visit: Payer: Self-pay | Admitting: Family Medicine

## 2013-11-02 DIAGNOSIS — F321 Major depressive disorder, single episode, moderate: Secondary | ICD-10-CM

## 2013-11-02 MED ORDER — SERTRALINE HCL 100 MG PO TABS: 100.0000 mg | ORAL_TABLET | Freq: Every day | ORAL | Status: DC

## 2013-11-03 ENCOUNTER — Telehealth: Payer: Self-pay | Admitting: Family Medicine

## 2013-11-03 NOTE — Telephone Encounter (Signed)
Thought she had another appt scheduled for Nov. Wants to know when she could get another injection

## 2013-11-03 NOTE — Telephone Encounter (Signed)
Spoke with patient had an injection done on 8/28 and would like to know when she can have one again. [place]

## 2013-11-07 NOTE — Telephone Encounter (Signed)
Can have right hand injection now if needed, can have left hand injection on 12/24/13, please call patient and offer appointment if needs right hand injection

## 2013-11-21 ENCOUNTER — Other Ambulatory Visit: Payer: Self-pay | Admitting: Family Medicine

## 2013-11-21 ENCOUNTER — Encounter: Payer: Self-pay | Admitting: Family Medicine

## 2013-11-21 ENCOUNTER — Ambulatory Visit (INDEPENDENT_AMBULATORY_CARE_PROVIDER_SITE_OTHER): Payer: Self-pay | Admitting: Family Medicine

## 2013-11-21 VITALS — BP 159/81 | HR 75 | Temp 98.3°F | Ht 65.0 in | Wt 183.7 lb

## 2013-11-21 DIAGNOSIS — M79641 Pain in right hand: Secondary | ICD-10-CM

## 2013-11-21 DIAGNOSIS — M6530 Trigger finger, unspecified finger: Secondary | ICD-10-CM

## 2013-11-21 DIAGNOSIS — F331 Major depressive disorder, recurrent, moderate: Secondary | ICD-10-CM

## 2013-11-21 MED ORDER — METHYLPREDNISOLONE ACETATE 40 MG/ML IJ SUSP
40.0000 mg | Freq: Once | INTRAMUSCULAR | Status: AC
  Administered 2013-11-21: 10 mg via INTRAMUSCULAR

## 2013-11-21 NOTE — Progress Notes (Signed)
   Subjective:    Patient ID: [name], female    DOB: May 17, 1950, 63 y.o.   MRN: [id number]  HPI 63 y/o female presents for follow up of right ring finger triggering.   Right ring finer trigger - patient has had increased triggering of right ring finger, previously improved with steroid injection in June of 2015, wearing splint daily, pain only with triggering, no swelling of her palm or fingers.    Review of Systems  Constitutional: Negative for fever and fatigue.       Objective:   Physical Exam Vitals: reviewed MSK: triggering of right ring finger, mild tenderness over dorsal 4th metacarpal bone, no redness or swelling  Procedure note:  Trigger finger injection of right ring finger, risks and benefits of the procedure were discussed with the patient, written consent was obtained, the affected area was cleaned in a sterile fashion using alcohol swabs, a 5/8 inch 25-gauge needle was used to inject 10 mg of Depo-Medrol and 0.25 cc Lidocaine without epinephrine into the right ring finger flexor tendon at the distal palmar crease, patient tolerated the procedure well, no complications, the injected area was covered with a Band-Aid     Assessment & Plan:  Please see problem specific assessment and plan.

## 2013-11-21 NOTE — Assessment & Plan Note (Signed)
Right ring finger has had increased triggering.  -steroid injection provided (see procedure note) -continue splinting as needed

## 2013-12-02 ENCOUNTER — Other Ambulatory Visit: Payer: Self-pay | Admitting: *Deleted

## 2013-12-02 NOTE — Telephone Encounter (Signed)
Pt is calling and checking on the status of her refill request for Metformin. She is down to one pill. jw

## 2013-12-02 NOTE — Telephone Encounter (Addendum)
Please see message from patient.  Clovis PuMartin, Shaquanta Harkless L, RN

## 2013-12-05 MED ORDER — METFORMIN HCL 500 MG PO TABS: 500.0000 mg | ORAL_TABLET | Freq: Every day | ORAL | Status: DC

## 2013-12-05 NOTE — Telephone Encounter (Signed)
[facility]'s [facility].  Precept with Dr. Mauricio PoBreen and informed him of the situation was with the refill.  Message will be forwarded to Dr. Mauricio PoBreen to refill medication.  Clovis PuMartin, Khaza Blansett L, RN

## 2013-12-05 NOTE — Telephone Encounter (Signed)
Pt called back her refill of her metaformin. She called just before 5 on Friday. It has not been sent in yet. She is very upset that the refill has not been done. She says Dr Randolm IdolFletke is her dr not dr schmitz. She asked for an administrator to call her back

## 2013-12-05 NOTE — Telephone Encounter (Signed)
Refill entered at the request of the triage nurse.  To be addressed by primary physician for further refills.  JB

## 2013-12-16 ENCOUNTER — Telehealth: Payer: Self-pay | Admitting: Family Medicine

## 2013-12-16 ENCOUNTER — Encounter: Payer: Self-pay | Admitting: Family Medicine

## 2013-12-16 NOTE — Progress Notes (Signed)
FWD to white team.

## 2013-12-16 NOTE — Progress Notes (Signed)
Patient is concerned because her refills for Zoloft and Glucophage are for only one month. Please follow up with Patient at [phone].

## 2013-12-19 NOTE — Telephone Encounter (Signed)
LMOVM for pt to return call, please inform of below. Fleeger, Shannon RochesterJessica Reid

## 2013-12-19 NOTE — Telephone Encounter (Signed)
Patient was only given 30 day supply of medications as she hasn't been seen by PCP. In order to have a longer supply, she would need to have an appt with PCP.   Myra RudeJeremy E Stefani Baik, MD PGY-2, [facility] 12/19/2013, 1:44 PM

## 2014-01-02 ENCOUNTER — Other Ambulatory Visit: Payer: Self-pay | Admitting: *Deleted

## 2014-01-02 DIAGNOSIS — E118 Type 2 diabetes mellitus with unspecified complications: Secondary | ICD-10-CM

## 2014-01-02 DIAGNOSIS — I10 Essential (primary) hypertension: Secondary | ICD-10-CM

## 2014-01-02 NOTE — Telephone Encounter (Signed)
[facility]  Please fax request to [phone].  Clovis PuMartin, Arlander Gillen L, RN

## 2014-01-04 MED ORDER — LISINOPRIL 20 MG PO TABS: 20.0000 mg | ORAL_TABLET | Freq: Every day | ORAL | Status: DC

## 2014-01-04 MED ORDER — METFORMIN HCL 500 MG PO TABS: 500.0000 mg | ORAL_TABLET | Freq: Every day | ORAL | Status: DC

## 2014-01-07 ENCOUNTER — Other Ambulatory Visit: Payer: Self-pay | Admitting: Family Medicine

## 2014-02-09 ENCOUNTER — Other Ambulatory Visit: Payer: Self-pay | Admitting: Internal Medicine

## 2014-02-09 DIAGNOSIS — Z1231 Encounter for screening mammogram for malignant neoplasm of breast: Secondary | ICD-10-CM

## 2014-02-13 ENCOUNTER — Other Ambulatory Visit: Payer: Self-pay | Admitting: Internal Medicine

## 2014-02-13 DIAGNOSIS — R413 Other amnesia: Secondary | ICD-10-CM

## 2014-02-16 ENCOUNTER — Ambulatory Visit: Payer: Medicaid Other

## 2014-02-22 ENCOUNTER — Ambulatory Visit
Admission: RE | Admit: 2014-02-22 | Discharge: 2014-02-22 | Disposition: A | Discharge status: Home / Self Care | Payer: Medicare Other | Source: Ambulatory Visit | Attending: Internal Medicine | Admitting: Internal Medicine

## 2014-02-22 DIAGNOSIS — R413 Other amnesia: Secondary | ICD-10-CM

## 2014-02-22 MED ORDER — GADOBENATE DIMEGLUMINE 529 MG/ML IV SOLN
15.0000 mL | Freq: Once | INTRAVENOUS | Status: AC | PRN
  Administered 2014-02-22: 15 mL via INTRAVENOUS

## 2014-02-24 ENCOUNTER — Ambulatory Visit
Admission: RE | Admit: 2014-02-24 | Discharge: 2014-02-24 | Disposition: A | Discharge status: Home / Self Care | Payer: Medicare Other | Source: Ambulatory Visit | Attending: Internal Medicine | Admitting: Internal Medicine

## 2014-02-24 DIAGNOSIS — Z1231 Encounter for screening mammogram for malignant neoplasm of breast: Secondary | ICD-10-CM

## 2014-04-01 ENCOUNTER — Other Ambulatory Visit: Payer: Self-pay | Admitting: Family Medicine

## 2014-04-01 DIAGNOSIS — E118 Type 2 diabetes mellitus with unspecified complications: Secondary | ICD-10-CM

## 2014-06-14 ENCOUNTER — Encounter (HOSPITAL_COMMUNITY): Payer: Self-pay | Admitting: Family Medicine

## 2014-06-14 ENCOUNTER — Emergency Department (HOSPITAL_COMMUNITY): Payer: Medicare Other

## 2014-06-14 ENCOUNTER — Emergency Department (HOSPITAL_COMMUNITY)
Admission: EM | Admit: 2014-06-14 | Discharge: 2014-06-14 | Disposition: A | Discharge status: Home / Self Care | Payer: Medicare Other | Attending: Emergency Medicine | Admitting: Emergency Medicine

## 2014-06-14 DIAGNOSIS — E119 Type 2 diabetes mellitus without complications: Secondary | ICD-10-CM | POA: Insufficient documentation

## 2014-06-14 DIAGNOSIS — Z8719 Personal history of other diseases of the digestive system: Secondary | ICD-10-CM | POA: Diagnosis not present

## 2014-06-14 DIAGNOSIS — R220 Localized swelling, mass and lump, head: Secondary | ICD-10-CM | POA: Diagnosis present

## 2014-06-14 DIAGNOSIS — I10 Essential (primary) hypertension: Secondary | ICD-10-CM | POA: Diagnosis not present

## 2014-06-14 DIAGNOSIS — Z720 Tobacco use: Secondary | ICD-10-CM | POA: Insufficient documentation

## 2014-06-14 DIAGNOSIS — H05011 Cellulitis of right orbit: Secondary | ICD-10-CM | POA: Insufficient documentation

## 2014-06-14 DIAGNOSIS — H00033 Abscess of eyelid right eye, unspecified eyelid: Secondary | ICD-10-CM

## 2014-06-14 DIAGNOSIS — Z7982 Long term (current) use of aspirin: Secondary | ICD-10-CM | POA: Insufficient documentation

## 2014-06-14 DIAGNOSIS — Z79899 Other long term (current) drug therapy: Secondary | ICD-10-CM | POA: Diagnosis not present

## 2014-06-14 DIAGNOSIS — Z8739 Personal history of other diseases of the musculoskeletal system and connective tissue: Secondary | ICD-10-CM | POA: Diagnosis not present

## 2014-06-14 LAB — CBC WITH DIFFERENTIAL/PLATELET
BASOS PCT: 0 % (ref 0–1)
Basophils Absolute: 0 10*3/uL (ref 0.0–0.1)
EOS ABS: 0.2 10*3/uL (ref 0.0–0.7)
Eosinophils Relative: 3 % (ref 0–5)
HCT: 38.6 % (ref 36.0–46.0)
Hemoglobin: 12.6 g/dL (ref 12.0–15.0)
Lymphocytes Relative: 31 % (ref 12–46)
Lymphs Abs: 2 10*3/uL (ref 0.7–4.0)
MCH: 27.8 pg (ref 26.0–34.0)
MCHC: 32.6 g/dL (ref 30.0–36.0)
MCV: 85.2 fL (ref 78.0–100.0)
Monocytes Absolute: 0.7 10*3/uL (ref 0.1–1.0)
Monocytes Relative: 11 % (ref 3–12)
NEUTROS PCT: 55 % (ref 43–77)
Neutro Abs: 3.4 10*3/uL (ref 1.7–7.7)
Platelets: 141 10*3/uL — ABNORMAL LOW (ref 150–400)
RBC: 4.53 MIL/uL (ref 3.87–5.11)
RDW: 15.2 % (ref 11.5–15.5)
WBC: 6.2 10*3/uL (ref 4.0–10.5)

## 2014-06-14 LAB — BASIC METABOLIC PANEL
Anion gap: 7 (ref 5–15)
BUN: 16 mg/dL (ref 6–20)
CO2: 26 mmol/L (ref 22–32)
CREATININE: 0.94 mg/dL (ref 0.44–1.00)
Calcium: 9.1 mg/dL (ref 8.9–10.3)
Chloride: 107 mmol/L (ref 101–111)
GFR calc Af Amer: >60 mL/min (ref >60)
GFR calc non Af Amer: >60 mL/min (ref >60)
Glucose, Bld: 112 mg/dL — ABNORMAL HIGH (ref 65–99)
Potassium: 3.6 mmol/L (ref 3.5–5.1)
SODIUM: 140 mmol/L (ref 135–145)

## 2014-06-14 MED ORDER — CLINDAMYCIN HCL 150 MG PO CAPS: 300.0000 mg | ORAL_CAPSULE | Freq: Three times a day (TID) | ORAL | Status: DC

## 2014-06-14 MED ORDER — HYDROCODONE-ACETAMINOPHEN 5-325 MG PO TABS: 1.0000 | ORAL_TABLET | Freq: Four times a day (QID) | ORAL | Status: DC | PRN

## 2014-06-14 MED ORDER — MORPHINE SULFATE 4 MG/ML IJ SOLN
4.0000 mg | Freq: Once | INTRAMUSCULAR | Status: AC
  Administered 2014-06-14: 4 mg via INTRAVENOUS
  Filled 2014-06-14: qty 1

## 2014-06-14 MED ORDER — IOHEXOL 300 MG/ML  SOLN
80.0000 mL | Freq: Once | INTRAMUSCULAR | Status: AC | PRN
  Administered 2014-06-14: 80 mL via INTRAVENOUS

## 2014-06-14 NOTE — ED Notes (Signed)
Pt states has a lump/bump inside left nares-- causing area around left eye to be swollen and very painful.

## 2014-06-14 NOTE — ED Notes (Signed)
Pt sts that she saw her PCP yesterday due to swelling in nose. sts now worse after taking ibuprofen and tylenol. sts very painful.

## 2014-06-14 NOTE — ED Provider Notes (Signed)
CSN: [id number]     Arrival date & time 06/14/14  0806 History   First MD Initiated Contact with Patient 06/14/14 [phone]     Chief Complaint  Patient presents with  . Facial Swelling     (Consider location/radiation/quality/duration/timing/severity/associated sxs/prior Treatment) HPI Comments: Patient with history of diabetes, hypertension presents to the emergency department with chief complaint of swelling in her nose. She reports associated pain. It is very tender to palpation. She denies any trauma or bug bites. Denies any fevers or chills. She states that she was seen by her PCP, and was told to take Tylenol and ibuprofen. She states this has not helped. His morning when she woke she states that she can feel pressure and pain spreading through her nose and up to her eyes. She denies any vision changes.  The history is provided by the patient. No language interpreter was used.    Past Medical History  Diagnosis Date  . Hypertension   . Diabetes mellitus   . Pancreatitis 2010  . Idiopathic acute pancreatitis 09/16/2011  . Trigger finger    Past Surgical History  Procedure Laterality Date  . Cholecystectomy  2003  . Spine surgery  2006    Dr. Trey Sailors - L3-L4, L4-L5 laminotomy and foraminotomy   Family History  Problem Relation Age of Onset  . Cancer Mother   . Alcohol abuse Father    History  Substance Use Topics  . Smoking status: Current Every Day Smoker -- 0.50 packs/day    Types: Cigarettes  . Smokeless tobacco: Not on file     Comment: still in stress. looking for job.   . Alcohol Use: No   OB History    No data available     Review of Systems  Constitutional: Negative for fever and chills.  Respiratory: Negative for shortness of breath.   Cardiovascular: Negative for chest pain.  Gastrointestinal: Negative for nausea, vomiting, diarrhea and constipation.  Genitourinary: Negative for dysuria.  Skin: Positive for color change.  All other systems reviewed and  are negative.     Allergies  Review of patient's allergies indicates no known allergies.  Home Medications   Prior to Admission medications   Medication Sig Start Date End Date Taking? Authorizing Provider  acetaminophen (TYLENOL) 325 MG tablet Take 325 mg by mouth every 6 (six) hours as needed.   Yes Historical Provider, MD  ALPRAZolam Prudy Feeler) 0.5 MG tablet Take 0.5 mg by mouth daily.    Yes Historical Provider, MD  amLODipine (NORVASC) 5 MG tablet Take 5 mg by mouth daily.   Yes Historical Provider, MD  aspirin EC 81 MG tablet Take 81 mg by mouth daily.     Yes Historical Provider, MD  atorvastatin (LIPITOR) 40 MG tablet Take 40 mg by mouth daily. 06/13/14  Yes Historical Provider, MD  CALCIUM PO Take 1 tablet by mouth daily.   Yes Historical Provider, MD  ibuprofen (ADVIL,MOTRIN) 200 MG tablet Take 200 mg by mouth every 6 (six) hours as needed for moderate pain.   Yes Historical Provider, MD  lisinopril (PRINIVIL,ZESTRIL) 20 MG tablet Take 1 tablet (20 mg total) by mouth daily. 01/04/14  Yes Myra Rude, MD  metFORMIN (GLUCOPHAGE) 500 MG tablet TAKE ONE TABLET BY MOUTH ONCE DAILY 04/03/14  Yes Myra Rude, MD  mirtazapine (REMERON) 15 MG tablet Take 15 mg by mouth daily as needed (depressive disorder).   Yes Historical Provider, MD  multivitamin-iron-minerals-folic acid (CENTRUM) chewable tablet Chew 1 tablet by  mouth daily.   Yes Historical Provider, MD  sertraline (ZOLOFT) 100 MG tablet Take 1 tablet (100 mg total) by mouth 2 (two) times daily. 11/21/13  Yes Myra RudeJeremy E Schmitz, MD  simvastatin (ZOCOR) 40 MG tablet Take 40 mg by mouth daily.   Yes Historical Provider, MD  lisinopril (PRINIVIL,ZESTRIL) 20 MG tablet TAKE 1 TABLET (20 MG TOTAL) BY MOUTH DAILY. Patient not taking: Reported on 06/14/2014 01/10/14   Myra RudeJeremy E Schmitz, MD  mirtazapine (REMERON) 15 MG tablet Take 1 tablet (15 mg total) by mouth at bedtime. Patient not taking: Reported on 06/14/2014  09/16/13  Myra RudeJeremy E  Schmitz, MD   BP 157/87 mmHg  Pulse 60  Temp(Src) 98.2 F (36.8 C) (Oral)  Resp 14  SpO2 95% Physical Exam  Constitutional: She is oriented to person, place, and time. She appears well-developed and well-nourished.  HENT:  Head: Normocephalic and atraumatic.  Moderate swelling of the lateral nasal turbinates, erythematous external nose, moderate swelling, exquisitely tender to palpation, tenderness palpation over the eyes, no evidence of entrapment  Eyes: Conjunctivae and EOM are normal. Pupils are equal, round, and reactive to light.  Neck: Normal range of motion. Neck supple.  Cardiovascular: Normal rate and regular rhythm.  Exam reveals no gallop and no friction rub.   No murmur heard. Pulmonary/Chest: Effort normal and breath sounds normal. No respiratory distress. She has no wheezes. She has no rales. She exhibits no tenderness.  Abdominal: Soft. Bowel sounds are normal. She exhibits no distension and no mass. There is no tenderness. There is no rebound and no guarding.  Musculoskeletal: Normal range of motion. She exhibits no edema or tenderness.  Neurological: She is alert and oriented to person, place, and time.  Skin: Skin is warm and dry.  Psychiatric: She has a normal mood and affect. Her behavior is normal. Judgment and thought content normal.  Nursing note and vitals reviewed.   ED Course  Procedures (including critical care time) Results for orders placed or performed during the hospital encounter of 06/14/14  CBC with Differential/Platelet  Result Value Ref Range   WBC 6.2 4.0 - 10.5 K/uL   RBC 4.53 3.87 - 5.11 MIL/uL   Hemoglobin 12.6 12.0 - 15.0 g/dL   HCT 16.138.6 09.636.0 - 04.546.0 %   MCV 85.2 78.0 - 100.0 fL   MCH 27.8 26.0 - 34.0 pg   MCHC 32.6 30.0 - 36.0 g/dL   RDW 40.915.2 81.111.5 - 91.415.5 %   Platelets 141 (L) 150 - 400 K/uL   Neutrophils Relative % 55 43 - 77 %   Neutro Abs 3.4 1.7 - 7.7 K/uL   Lymphocytes Relative 31 12 - 46 %   Lymphs Abs 2.0 0.7 - 4.0 K/uL    Monocytes Relative 11 3 - 12 %   Monocytes Absolute 0.7 0.1 - 1.0 K/uL   Eosinophils Relative 3 0 - 5 %   Eosinophils Absolute 0.2 0.0 - 0.7 K/uL   Basophils Relative 0 0 - 1 %   Basophils Absolute 0.0 0.0 - 0.1 K/uL  Basic metabolic panel  Result Value Ref Range   Sodium 140 135 - 145 mmol/L   Potassium 3.6 3.5 - 5.1 mmol/L   Chloride 107 101 - 111 mmol/L   CO2 26 22 - 32 mmol/L   Glucose, Bld 112 (H) 65 - 99 mg/dL   BUN 16 6 - 20 mg/dL   Creatinine, Ser 7.820.94 0.44 - 1.00 mg/dL   Calcium 9.1 8.9 - 95.610.3 mg/dL  GFR calc non Af Amer >60 >60 mL/min   GFR calc Af Amer >60 >60 mL/min   Anion gap 7 5 - 15   Ct Maxillofacial W/cm  06/14/2014   CLINICAL DATA:  Left facial swelling localizing to the nose and left orbital region.  EXAM: CT MAXILLOFACIAL WITH CONTRAST  TECHNIQUE: Multidetector CT imaging of the maxillofacial structures was performed with intravenous contrast. Multiplanar CT image reconstructions were also generated. A small metallic BB was placed on the right temple in order to reliably differentiate right from left.  CONTRAST:  80mL OMNIPAQUE IOHEXOL 300 MG/ML  SOLN  COMPARISON:  None.  FINDINGS: There is suggestion of some inflammation and soft tissue swelling involving the nose. No focal abscess is identified. There is no evidence of preseptal or postseptal orbital inflammation bilaterally. No bony fractures or destruction.  The paranasal sinuses and mastoid air cells are normally aerated. The nasal septum is in the midline. Globes and extraocular muscles are symmetric and normal in appearance bilaterally. No masses, enlarged lymph nodes or vascular abnormalities are identified. Salivary glands are symmetric and normal in appearance. The airway is normally patent without surrounding soft tissue swelling. Bony structures are within normal limits.  IMPRESSION: Some inflammation and soft tissue swelling is seen involving the nose. No focal abscess is identified. There is no evidence of  orbital inflammation/infection.   Electronically Signed   By: Irish LackGlenn  Yamagata M.D.   On: 06/14/2014 12:22      EKG Interpretation None      MDM   Final diagnoses:  Preseptal cellulitis, right    Patient with concern for spreading preseptal cellulitis, will check CT maxillofacial and will reassess.  CT is remarkable for some inflammation of the soft tissue involving the nose. Will treat for preseptal cellulitis with clindamycin. We'll give pain medicine. Strict return precautions given. Patient understands and agrees with the plan. She is stable and ready for discharge.    Roxy Horsemanobert Vedh Ptacek, PA-C 06/14/14 1253  Blake DivineJohn Wofford, MD 06/15/14 [phone]

## 2014-06-14 NOTE — Discharge Instructions (Signed)

## 2014-07-03 ENCOUNTER — Ambulatory Visit (INDEPENDENT_AMBULATORY_CARE_PROVIDER_SITE_OTHER): Payer: Medicare Other | Admitting: Neurology

## 2014-07-03 ENCOUNTER — Encounter: Payer: Self-pay | Admitting: Neurology

## 2014-07-03 ENCOUNTER — Other Ambulatory Visit (INDEPENDENT_AMBULATORY_CARE_PROVIDER_SITE_OTHER): Payer: Medicare Other

## 2014-07-03 VITALS — BP 128/78 | HR 66 | Ht 65.0 in | Wt 198.1 lb

## 2014-07-03 DIAGNOSIS — R413 Other amnesia: Secondary | ICD-10-CM

## 2014-07-03 DIAGNOSIS — E119 Type 2 diabetes mellitus without complications: Secondary | ICD-10-CM

## 2014-07-03 DIAGNOSIS — I679 Cerebrovascular disease, unspecified: Secondary | ICD-10-CM | POA: Diagnosis not present

## 2014-07-03 DIAGNOSIS — I10 Essential (primary) hypertension: Secondary | ICD-10-CM

## 2014-07-03 DIAGNOSIS — Z720 Tobacco use: Secondary | ICD-10-CM

## 2014-07-03 DIAGNOSIS — F32A Depression, unspecified: Secondary | ICD-10-CM

## 2014-07-03 DIAGNOSIS — F329 Major depressive disorder, single episode, unspecified: Secondary | ICD-10-CM

## 2014-07-03 DIAGNOSIS — E785 Hyperlipidemia, unspecified: Secondary | ICD-10-CM

## 2014-07-03 LAB — LIPID PANEL
CHOLESTEROL: 111 mg/dL (ref 0–200)
HDL: 43.2 mg/dL (ref >39.00)
LDL CALC: 56 mg/dL (ref 0–99)
NONHDL: 67.8
Total CHOL/HDL Ratio: 3
Triglycerides: 58 mg/dL (ref 0.0–149.0)
VLDL: 11.6 mg/dL (ref 0.0–40.0)

## 2014-07-03 NOTE — Addendum Note (Signed)
Addended by: Marlane HatcherALLEN, Ashia Dehner C on: 07/03/2014 11:57 AM   Modules accepted: Orders

## 2014-07-03 NOTE — Addendum Note (Signed)
Addended by: Marlane HatcherALLEN, Upton Russey C on: 07/03/2014 10:29 AM   Modules accepted: Orders

## 2014-07-03 NOTE — Patient Instructions (Signed)
We will refer you for neuropsychological testing at [facility] Continue aspirin 81mg  daily Will check fasting lipid panel Stop smoking Follow up after neuropsychological testing

## 2014-07-03 NOTE — Progress Notes (Signed)
NEUROLOGY CONSULTATION NOTE  [name] MRN: [id number] DOB: November 25, 1950  Referring provider: Dr. Valentina Lucks Primary care provider: Dr. Valentina Lucks  Reason for consult:  Memory deficits  HISTORY OF PRESENT ILLNESS: [name] is a 64 year old right-handed woman with type 2 diabetes, depression, hypertension, cerebrovascular disease and hyperlipidemia who presents for memory problems.  Records, MRI of brain and labs reviewed.  She began noticing problems with memory a couple of months after her mother passed away from pancreatic cancer in February 2009.  She was very close to her mother and this was devastating to her.  She has suffered from depression since then.  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  She began to notice difficulty remembering how to perform tasks for work that she had done for many years.  She was able to hide her deficits from her bosses because she would ask others for help.  Her memory problems began to progress from there.  Often, she will walk into a room and forget what she was looking for.  She needs to bring a list to the grocery store.  She forgets names of people she just meets 5 minutes ago.  She reports that she often forgets to pay bills on time.  However, she denies problems with actually balancing her checkbook or correctly making calculations.  She sometimes forgets what she wants to say in conversation but denies word-finding difficulties.  On at least one occasion, she had to pull over on a familiar route in order to re-orient herself.  She denies trouble remembering names or faces of friends or family.  She denies remote memory problems.  She currently is on disability due to her memory problems and chronic back pain.  She has significant depression and is often tearful.  She reports that she is withdrawn and does not socialize much.  She is a Engineer, maintenance (IT).  She lives with her boyfriend.  She denies family history of memory  deficits.  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  Labs from January include B12 224, TSH 0.73, Sed Rate 31, Hgb A1c 6.2 and LDL 112.  She was started on B12 supplementation.  She already takes Lipitor .  PAST MEDICAL HISTORY: Past Medical History  Diagnosis Date  . Hypertension   . Diabetes mellitus   . Pancreatitis 2010  . Idiopathic acute pancreatitis 09/16/2011  . Trigger finger     PAST SURGICAL HISTORY: Past Surgical History  Procedure Laterality Date  . Cholecystectomy  2003  . Spine surgery  2006    Dr. Trey Sailors - L3-L4, L4-L5 laminotomy and foraminotomy    MEDICATIONS: Current Outpatient Prescriptions on File Prior to Visit  Medication Sig Dispense Refill  . acetaminophen (TYLENOL) 325 MG tablet Take 325 mg by mouth every 6 (six) hours as needed.    . ALPRAZolam (XANAX) 0.5 MG tablet Take 0.5 mg by mouth daily.     Marland Kitchen amLODipine (NORVASC) 5 MG tablet Take 5 mg by mouth daily.    Marland Kitchen aspirin EC 81 MG tablet Take 81 mg by mouth daily.      Marland Kitchen atorvastatin (LIPITOR) 40 MG tablet Take 40 mg by mouth daily.    Marland Kitchen CALCIUM PO Take 1 tablet by mouth daily.    Marland Kitchen ibuprofen (ADVIL,MOTRIN) 200 MG tablet Take 200 mg by mouth every 6 (six) hours as needed  for moderate pain.    Marland Kitchen lisinopril (PRINIVIL,ZESTRIL) 20 MG tablet TAKE 1 TABLET (20 MG TOTAL) BY MOUTH DAILY. (Patient not taking: Reported on 06/14/2014) 90 tablet 0  . metFORMIN (GLUCOPHAGE) 500 MG tablet TAKE ONE TABLET BY MOUTH ONCE DAILY 30 tablet 1  . mirtazapine (REMERON) 15 MG tablet Take 15 mg by mouth daily as needed (depressive disorder).    . multivitamin-iron-minerals-folic acid (CENTRUM) chewable tablet Chew 1 tablet by mouth daily.    . sertraline (ZOLOFT) 100 MG tablet Take 1 tablet (100 mg total) by mouth 2 (two) times daily. 60 tablet 2  . simvastatin (ZOCOR) 40 MG tablet Take 40 mg by mouth daily.      No current facility-administered medications on file prior to visit.    ALLERGIES: No Known Allergies  FAMILY HISTORY: Family History  Problem Relation Age of Onset  . Cancer Mother   . Alcohol abuse Father     SOCIAL HISTORY: History   Social History  . Marital Status: Single    Spouse Name: N/A  . Number of Children: N/A  . Years of Education: N/A   Occupational History  . Not on file.   Social History Main Topics  . Smoking status: Current Every Day Smoker -- 0.50 packs/day    Types: Cigarettes  . Smokeless tobacco: Never Used     Comment: still in stress. looking for job.   . Alcohol Use: No  . Drug Use: No  . Sexual Activity: Not on file   Other Topics Concern  . Not on file   Social History Narrative   Lives with boyfriend.  No children.  Retired from [employer].  Education: 2 years of college.    REVIEW OF SYSTEMS: Constitutional: No fevers, chills, or sweats, no generalized fatigue, change in appetite Eyes: No visual changes, double vision, eye pain Ear, nose and throat: No hearing loss, ear pain, nasal congestion, sore throat Cardiovascular: No chest pain, palpitations Respiratory:  No shortness of breath at rest or with exertion, wheezes GastrointestinaI: No nausea, vomiting, diarrhea, abdominal pain, fecal incontinence Genitourinary:  No dysuria, urinary retention or frequency Musculoskeletal:  Back pain Integumentary: No rash, pruritus, skin lesions Neurological: as above Psychiatric: depression, insomnia, anxiety Endocrine: No palpitations, fatigue, diaphoresis, mood swings, change in appetite, change in weight, increased thirst Hematologic/Lymphatic:  No anemia, purpura, petechiae. Allergic/Immunologic: no itchy/runny eyes, nasal congestion, recent allergic reactions, rashes  PHYSICAL EXAM: Filed Vitals:   07/03/14 0758  BP: 128/78  Pulse: 66   General: No acute distress, tearful Head:  Normocephalic/atraumatic Eyes:  fundi  unremarkable, without vessel changes, exudates, hemorrhages or papilledema. Neck: supple, no paraspinal tenderness, full range of motion Back: No paraspinal tenderness Heart: regular rate and rhythm Lungs: Clear to auscultation bilaterally. Vascular: No carotid bruits. Neurological Exam: Mental status: alert and oriented to person, place, and time (except day), delayed recall poor, remote memory intact, fund of knowledge intact, attention and concentration intact, speech fluent and not dysarthric, language intact. Montreal Cognitive Assessment  07/03/2014  Visuospatial/ Executive (0/5) 3  Naming (0/3) 3  Attention: Read list of digits (0/2) 2  Attention: Read list of letters (0/1) 1  Attention: Serial 7 subtraction starting at 100 (0/3) 3  Language: Repeat phrase (0/2) 2  Language : Fluency (0/1) 1  Abstraction (0/2) 0  Delayed Recall (0/5) 0  Orientation (0/6) 5  Total 20  Adjusted Score (based on education) 20   Cranial nerves: CN I: not tested CN II: pupils equal, round  and reactive to light, visual fields intact, fundi unremarkable, without vessel changes, exudates, hemorrhages or papilledema. CN III, IV, VI:  full range of motion, no nystagmus, no ptosis CN V: facial sensation intact CN VII: upper and lower face symmetric CN VIII: hearing intact CN IX, X: gag intact, uvula midline CN XI: sternocleidomastoid and trapezius muscles intact CN XII: tongue midline Bulk & Tone: normal, no fasciculations. Motor:  5/5 throughout Sensation:  Temperature and vibration intact Deep Tendon Reflexes:  2+ throughout, toes downgoing Finger to nose testing:  No dysmetria Heel to shin:  No dysmetria Gait:  Normal station and stride.  Able to turn.  Difficulty tandem walking. Romberg negative.  IMPRESSION: Memory deficits.   Depression Cerebrovascular disease Hyperlipidemia Type 2 diabetes Hypertension Tobacco abuse  PLAN: 1.  Need to get neuropsychological testing to sort out  organic impairment versus related to depression 2.  ASA 81mg  daily 3.  Continue Lipitor 40mg .  Check lipid panel (LDL goal should be less than 70) 3.  Blood pressure and diabetes management 4. Discussed smoking cessation  5. Follow up after testing.  Thank you for allowing me to take part in the care of this patient.  Shon MilletAdam Jaffe, DO  CC:  Kirby FunkJohn Griffin, MD

## 2014-07-04 ENCOUNTER — Other Ambulatory Visit: Payer: Self-pay | Admitting: *Deleted

## 2014-07-04 MED ORDER — LEVOTHYROXINE SODIUM 50 MCG PO TABS: 50.0000 ug | ORAL_TABLET | Freq: Every day | ORAL | Status: DC

## 2014-07-05 ENCOUNTER — Telehealth: Payer: Self-pay | Admitting: Neurology

## 2014-07-05 NOTE — Telephone Encounter (Signed)
LM for pt on home VM advising labs look fine. Pt to call with any questions or concerns / Sherri S.

## 2014-07-05 NOTE — Telephone Encounter (Signed)
-----   Message from Griffin DakinSherri R Sands sent at 07/05/2014 12:23 AM EDT ----- Call pt and document via telephone encounter ----- Message -----    From: Drema DallasAdam R Jaffe, DO    Sent: 07/03/2014   1:21 PM      To: Marlane HatcherErica Allen, CMA  Lipid panel looks okay

## 2014-09-08 ENCOUNTER — Ambulatory Visit (INDEPENDENT_AMBULATORY_CARE_PROVIDER_SITE_OTHER): Payer: Medicare Other | Admitting: Neurology

## 2014-09-08 ENCOUNTER — Other Ambulatory Visit: Payer: Self-pay | Admitting: *Deleted

## 2014-09-08 DIAGNOSIS — R413 Other amnesia: Secondary | ICD-10-CM

## 2014-09-11 NOTE — Progress Notes (Signed)
No charge. 

## 2014-11-03 ENCOUNTER — Ambulatory Visit (INDEPENDENT_AMBULATORY_CARE_PROVIDER_SITE_OTHER): Payer: Medicare Other | Admitting: Neurology

## 2014-11-03 ENCOUNTER — Encounter: Payer: Self-pay | Admitting: Neurology

## 2014-11-03 VITALS — BP 140/90 | HR 78 | Ht 65.0 in | Wt 189.9 lb

## 2014-11-03 DIAGNOSIS — F0280 Dementia in other diseases classified elsewhere without behavioral disturbance: Secondary | ICD-10-CM

## 2014-11-03 DIAGNOSIS — G300 Alzheimer's disease with early onset: Secondary | ICD-10-CM | POA: Diagnosis not present

## 2014-11-03 DIAGNOSIS — F02818 Dementia in other diseases classified elsewhere, unspecified severity, with other behavioral disturbance: Secondary | ICD-10-CM | POA: Insufficient documentation

## 2014-11-03 DIAGNOSIS — F339 Major depressive disorder, recurrent, unspecified: Secondary | ICD-10-CM

## 2014-11-03 MED ORDER — DONEPEZIL HCL 5 MG PO TABS: 5.0000 mg | ORAL_TABLET | Freq: Every day | ORAL | Status: DC

## 2014-11-03 NOTE — Patient Instructions (Signed)
1.  We will start donepezil (Aricept)  daily for four weeks.  If you are tolerating the medication, then after four weeks, we will increase the dose to  daily.  Side effects include nausea, vomiting, diarrhea, vivid dreams, and muscle cramps.  Please call the clinic if you experience any of these symptoms. 2.  Will have somebody from Home Health come to your place to assess safety and make any recommendations 3.  Read, play games, puzzles, crossword puzzles, brain teasers 4.  Have somebody set up your pill box at the beginning of each week.  Have somebody look over your bills to make sure they are paid.  Use GPS when driving.   5.  Eat Mediterranean diet.  Do not skip meals.   6.  We will refer you to Behavioral Medicine for therapy and counseling 7.  Follow up in 6 months.

## 2014-11-03 NOTE — Progress Notes (Signed)
NEUROLOGY FOLLOW UP OFFICE NOTE  TENEA SENS [id number]  HISTORY OF PRESENT ILLNESS: Jamyra Zweig is a 64 year old right-handed woman with type 2 diabetes, depression, hypertension, cerebrovascular disease and hyperlipidemia who follows up for memory deficits.  She is accompanied by her neighbor who provides some history.  Neuropsych report reviewed.  UPDATE: Ms. Anfinson underwent neuropsychological testing on 10/11/14.  The impression of the test was an unspecified mild dementia with behavioral disturbance, which may be Alzheimer's disease.  However, possible suboptimal performance effort due to depression and post-traumatic stress, was also considered to play a factor.  Performance demonstrated declines in semantic knowledge, aspects of executive function, processing speed, visuoconstruction, and memory retrieval and recognition, which may be seen with Alzheimer's disease.  HISTORY: She began noticing problems with memory a couple of months after her mother passed away from pancreatic cancer in February 2009.  She was very close to her mother and this was devastating to her.  She has suffered from depression since then.  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  She began to notice difficulty remembering how to perform tasks for work that she had done for many years.  She was able to hide her deficits from her bosses because she would ask others for help.  Her memory problems began to progress from there.  Often, she will walk into a room and forget what she was looking for.  She needs to bring a list to the grocery store.  She forgets names of people she just meets 5 minutes ago.  She reports that she often forgets to pay bills on time.  However, she denies problems with actually balancing her checkbook or correctly making calculations.  She sometimes forgets what she wants to say in conversation but denies word-finding difficulties.  On at least one occasion, she had to  pull over on a familiar route in order to re-orient herself.  She denies trouble remembering names or faces of friends or family.  She denies remote memory problems.  She currently is on disability due to her memory problems and chronic back pain.  She has significant depression and is often tearful.  She reports that she is withdrawn and does not socialize much.  She is a Engineer, maintenance (IT).  She lives with her boyfriend.  She denies family history of memory deficits.  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  Labs from January include B12 224, TSH 0.73, Sed Rate 31, Hgb A1c 6.2 and LDL 112.  She was started on B12 supplementation.  She already takes Lipitor .  PAST MEDICAL HISTORY: Past Medical History  Diagnosis Date  . Hypertension   . Diabetes mellitus   . Pancreatitis 2010  . Idiopathic acute pancreatitis 09/16/2011  . Trigger finger     MEDICATIONS: Current Outpatient Prescriptions on File Prior to Visit  Medication Sig Dispense Refill  . acetaminophen (TYLENOL) 325 MG tablet Take 325 mg by mouth every 6 (six) hours as needed.    . ALPRAZolam (XANAX) 0.5 MG tablet Take 0.5 mg by mouth daily.     Marland Kitchen amLODipine (NORVASC) 5 MG tablet Take 5 mg by mouth daily.    Marland Kitchen aspirin EC 81 MG tablet Take 81 mg by mouth daily.      Marland Kitchen atorvastatin (LIPITOR) 40 MG tablet Take 40 mg by mouth daily.    Marland Kitchen  CALCIUM PO Take 1 tablet by mouth daily.    Marland Kitchen HYDROcodone-acetaminophen (NORCO/VICODIN) 5-325 MG per tablet     . ibuprofen (ADVIL,MOTRIN) 200 MG tablet Take 200 mg by mouth every 6 (six) hours as needed for moderate pain.    Marland Kitchen levothyroxine (SYNTHROID, LEVOTHROID) 50 MCG tablet Take 1 tablet (50 mcg total) by mouth daily. 90 tablet 1  . lisinopril (PRINIVIL,ZESTRIL) 20 MG tablet TAKE 1 TABLET (20 MG TOTAL) BY MOUTH DAILY. 90 tablet 0  . metFORMIN (GLUCOPHAGE) 500 MG tablet TAKE ONE TABLET  BY MOUTH ONCE DAILY 30 tablet 1  . mirtazapine (REMERON) 15 MG tablet Take 15 mg by mouth daily as needed (depressive disorder).    . multivitamin-iron-minerals-folic acid (CENTRUM) chewable tablet Chew 1 tablet by mouth daily.    . sertraline (ZOLOFT) 100 MG tablet Take 1 tablet (100 mg total) by mouth 2 (two) times daily. 60 tablet 2  . simvastatin (ZOCOR) 40 MG tablet Take 40 mg by mouth daily.     No current facility-administered medications on file prior to visit.    ALLERGIES: No Known Allergies  FAMILY HISTORY: Family History  Problem Relation Age of Onset  . Cancer Mother   . Alcohol abuse Father     SOCIAL HISTORY: Social History   Social History  . Marital Status: Single    Spouse Name: N/A  . Number of Children: N/A  . Years of Education: N/A   Occupational History  . Not on file.   Social History Main Topics  . Smoking status: Current Every Day Smoker -- 0.50 packs/day    Types: Cigarettes  . Smokeless tobacco: Never Used     Comment: still in stress. looking for job.   . Alcohol Use: No  . Drug Use: No  . Sexual Activity: Not on file   Other Topics Concern  . Not on file   Social History Narrative   Lives with boyfriend.  No children.  Retired from [employer].  Education: 2 years of college.    REVIEW OF SYSTEMS: Constitutional: fatigue Eyes: No visual changes, double vision, eye pain Ear, nose and throat: No hearing loss, ear pain, nasal congestion, sore throat Cardiovascular: No chest pain, palpitations Respiratory:  No shortness of breath at rest or with exertion, wheezes GastrointestinaI: No nausea, vomiting, diarrhea, abdominal pain, fecal incontinence Genitourinary:  No dysuria, urinary retention or frequency Musculoskeletal:  No neck pain, back pain Integumentary: No rash, pruritus, skin lesions Neurological: as above Psychiatric: depression Endocrine: Decreased appetite Hematologic/Lymphatic:  No anemia, purpura,  petechiae. Allergic/Immunologic: no itchy/runny eyes, nasal congestion, recent allergic reactions, rashes  PHYSICAL EXAM: Filed Vitals:   11/03/14 1525  BP: 140/90  Pulse: 78   General: No acute distress.  Head:  Normocephalic/atraumatic  IMPRESSION: Alzheimer's disease Depression  PLAN: Start Aricept.  In two months, if tolerating, will start Namenda. ASA and Lipitor  Needs assistance with daily functioning, such as preparing and supervising meals, assistance with financial transactions and paying bills, managing medications (she should have somebody set medications in pillbox and she will need reminders to take her medication at appropriate time, either by an individual or use of alarm), should use GPS when driving.  Will get Home Health assessment Continue use of sertraline and mirtazepine for depression and anxiety.  Also will refer for psychotherapy to address emotional distress Obtain use of an emergency/fall alert device (such as lanyard, bracelet). Mediterranean diet Smoking cessation Information on Alzheimer's support groups given. Follow up in 6 months.  45 minutes spent face to face with patient, 100% spent discussing diagnosis and management.  Shon Millet, DO  CC:  Kirby Funk, MD

## 2014-11-06 ENCOUNTER — Telehealth: Payer: Self-pay | Admitting: *Deleted

## 2014-11-06 DIAGNOSIS — F339 Major depressive disorder, recurrent, unspecified: Secondary | ICD-10-CM

## 2014-11-06 DIAGNOSIS — F0280 Dementia in other diseases classified elsewhere without behavioral disturbance: Secondary | ICD-10-CM

## 2014-11-06 DIAGNOSIS — G309 Alzheimer's disease, unspecified: Secondary | ICD-10-CM

## 2014-11-06 NOTE — Telephone Encounter (Signed)
Faxed referral form to [facility] today waiting on response    Called [facility] Outpatient department to schedule , they will fax over referral form    Dr Everlena Cooper for this patient do you want psychiatry or therapy for Behavioral Health?

## 2014-11-06 NOTE — Telephone Encounter (Signed)
therapy

## 2014-11-08 NOTE — Telephone Encounter (Signed)
Referrals are faxed and in the folder I showed you Morrie Sheldonshley in case you hear back from them

## 2014-11-08 NOTE — Telephone Encounter (Signed)
Shianne called from [facility] in regards to a referral they got for PT and had a couple questions/Dawn CB# [phone] Opt#1

## 2014-11-08 NOTE — Telephone Encounter (Signed)
Ok. Thanks!

## 2014-11-08 NOTE — Telephone Encounter (Signed)
Called [name] back and answered questions about insurance and dx.

## 2014-11-20 ENCOUNTER — Ambulatory Visit: Payer: Medicare Other | Admitting: Dietician

## 2014-11-24 ENCOUNTER — Other Ambulatory Visit: Payer: Self-pay | Admitting: *Deleted

## 2014-11-24 ENCOUNTER — Other Ambulatory Visit: Payer: Self-pay

## 2014-11-24 DIAGNOSIS — R413 Other amnesia: Secondary | ICD-10-CM

## 2014-11-24 MED ORDER — DONEPEZIL HCL 5 MG PO TABS: 5.0000 mg | ORAL_TABLET | Freq: Every day | ORAL | Status: DC

## 2014-11-24 MED ORDER — LEVOTHYROXINE SODIUM 50 MCG PO TABS: 50.0000 ug | ORAL_TABLET | Freq: Every day | ORAL | Status: DC

## 2014-12-07 ENCOUNTER — Other Ambulatory Visit: Payer: Self-pay | Admitting: Orthopedic Surgery

## 2014-12-07 NOTE — H&P (Signed)
[name] is an 64 y.o. female.   CC / Reason for Visit: Left ring finger painful enlargement with triggering HPI: This patient is a 64 year old RHD retired female who presents for evaluation of her left ring finger.  She reports having had a trigger digit that has been injected at least twice in the past, perhaps most recently in July.  Each time she had some alleviation of symptoms, but then recurred.  She has also developed a fullness or mass on the volar surface overlying the base of the digit that is tender with palpation upon it.  Past Medical History  Diagnosis Date  . Hypertension   . Diabetes mellitus   . Pancreatitis 2010  . Idiopathic acute pancreatitis 09/16/2011  . Trigger finger     Past Surgical History  Procedure Laterality Date  . Cholecystectomy  2003  . Spine surgery  2006    Dr. Trey SailorsMark Roy - L3-L4, L4-L5 laminotomy and foraminotomy    Family History  Problem Relation Age of Onset  . Cancer Mother   . Alcohol abuse Father    Social History:  reports that she has been smoking Cigarettes.  She has been smoking about 0.50 packs per day. She has never used smokeless tobacco. She reports that she does not drink alcohol or use illicit drugs.  Allergies: No Known Allergies  No prescriptions prior to admission    No results found for this or any previous visit (from the past 48 hour(s)). No results found.  Review of Systems  All other systems reviewed and are negative.   There were no vitals taken for this visit. Physical Exam  Constitutional:  WD, WN, NAD HEENT:  NCAT, EOMI Neuro/Psych:  Alert & oriented to person, place, and time; appropriate mood & affect Lymphatic: No generalized UE edema or lymphadenopathy Extremities / MSK:  Both UE are normal with respect to appearance, ranges of motion, joint stability, muscle strength/tone, sensation, & perfusion except as otherwise noted:  Left ring finger tender, with a lump at the base of the digit, which may be  a retinacular cyst off of the A1 pulley.  There is observable triggering with full active flexion and extension, and slightly diminished total motion.  Labs / Xrays:  No radiographic studies obtained today.  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  I recommended surgical excision of the mass, with pulley release as well.  She would like to proceed promptly, and this is tentatively scheduled for Monday the 14th.  The details of the operative procedure were discussed with the patient.  Questions were invited and answered.  In addition to the goal of the procedure, the risks of the procedure to include but not limited to bleeding; infection; damage to the nerves or blood vessels that could result in bleeding, numbness, weakness, chronic pain, and the need for additional procedures; stiffness; the need for revision surgery; and anesthetic risks, were reviewed.  No specific outcome was guaranteed or implied.  Informed consent was obtained.  Abram Sax A. 12/07/2014, 4:32 PM

## 2014-12-08 ENCOUNTER — Encounter (HOSPITAL_BASED_OUTPATIENT_CLINIC_OR_DEPARTMENT_OTHER)
Admission: RE | Admit: 2014-12-08 | Discharge: 2014-12-08 | Disposition: A | Discharge status: Home / Self Care | Payer: Medicare Other | Source: Ambulatory Visit | Attending: Orthopedic Surgery | Admitting: Orthopedic Surgery

## 2014-12-08 ENCOUNTER — Other Ambulatory Visit: Payer: Self-pay

## 2014-12-08 ENCOUNTER — Encounter (HOSPITAL_BASED_OUTPATIENT_CLINIC_OR_DEPARTMENT_OTHER): Payer: Self-pay | Admitting: *Deleted

## 2014-12-08 DIAGNOSIS — M67844 Other specified disorders of tendon, left hand: Secondary | ICD-10-CM | POA: Diagnosis present

## 2014-12-08 DIAGNOSIS — F329 Major depressive disorder, single episode, unspecified: Secondary | ICD-10-CM | POA: Diagnosis not present

## 2014-12-08 DIAGNOSIS — Z79891 Long term (current) use of opiate analgesic: Secondary | ICD-10-CM | POA: Diagnosis not present

## 2014-12-08 DIAGNOSIS — Z79899 Other long term (current) drug therapy: Secondary | ICD-10-CM | POA: Diagnosis not present

## 2014-12-08 DIAGNOSIS — F17210 Nicotine dependence, cigarettes, uncomplicated: Secondary | ICD-10-CM | POA: Diagnosis not present

## 2014-12-08 DIAGNOSIS — Z7984 Long term (current) use of oral hypoglycemic drugs: Secondary | ICD-10-CM | POA: Diagnosis not present

## 2014-12-08 DIAGNOSIS — E119 Type 2 diabetes mellitus without complications: Secondary | ICD-10-CM | POA: Diagnosis not present

## 2014-12-08 DIAGNOSIS — I10 Essential (primary) hypertension: Secondary | ICD-10-CM | POA: Diagnosis not present

## 2014-12-08 DIAGNOSIS — Z7982 Long term (current) use of aspirin: Secondary | ICD-10-CM | POA: Diagnosis not present

## 2014-12-08 DIAGNOSIS — Z6832 Body mass index (BMI) 32.0-32.9, adult: Secondary | ICD-10-CM | POA: Diagnosis not present

## 2014-12-08 DIAGNOSIS — M65342 Trigger finger, left ring finger: Secondary | ICD-10-CM | POA: Diagnosis not present

## 2014-12-08 LAB — BASIC METABOLIC PANEL
ANION GAP: 10 (ref 5–15)
BUN: 10 mg/dL (ref 6–20)
CHLORIDE: 106 mmol/L (ref 101–111)
CO2: 24 mmol/L (ref 22–32)
Calcium: 9.5 mg/dL (ref 8.9–10.3)
Creatinine, Ser: 0.89 mg/dL (ref 0.44–1.00)
GFR calc Af Amer: >60 mL/min (ref >60)
GFR calc non Af Amer: >60 mL/min (ref >60)
GLUCOSE: 109 mg/dL — AB (ref 65–99)
POTASSIUM: 4.6 mmol/L (ref 3.5–5.1)
Sodium: 140 mmol/L (ref 135–145)

## 2014-12-11 ENCOUNTER — Encounter (HOSPITAL_BASED_OUTPATIENT_CLINIC_OR_DEPARTMENT_OTHER): Payer: Self-pay | Admitting: Certified Registered"

## 2014-12-11 ENCOUNTER — Ambulatory Visit (HOSPITAL_BASED_OUTPATIENT_CLINIC_OR_DEPARTMENT_OTHER)
Admission: RE | Admit: 2014-12-11 | Discharge: 2014-12-11 | Disposition: A | Discharge status: Home / Self Care | Payer: Medicare Other | Source: Ambulatory Visit | Attending: Orthopedic Surgery | Admitting: Orthopedic Surgery

## 2014-12-11 ENCOUNTER — Ambulatory Visit (HOSPITAL_BASED_OUTPATIENT_CLINIC_OR_DEPARTMENT_OTHER): Payer: Medicare Other | Admitting: Certified Registered"

## 2014-12-11 ENCOUNTER — Encounter (HOSPITAL_BASED_OUTPATIENT_CLINIC_OR_DEPARTMENT_OTHER)
Admission: RE | Disposition: A | Discharge status: Home / Self Care | Payer: Self-pay | Source: Ambulatory Visit | Attending: Orthopedic Surgery

## 2014-12-11 DIAGNOSIS — I10 Essential (primary) hypertension: Secondary | ICD-10-CM | POA: Insufficient documentation

## 2014-12-11 DIAGNOSIS — Z7982 Long term (current) use of aspirin: Secondary | ICD-10-CM | POA: Insufficient documentation

## 2014-12-11 DIAGNOSIS — Z6832 Body mass index (BMI) 32.0-32.9, adult: Secondary | ICD-10-CM | POA: Insufficient documentation

## 2014-12-11 DIAGNOSIS — Z7984 Long term (current) use of oral hypoglycemic drugs: Secondary | ICD-10-CM | POA: Insufficient documentation

## 2014-12-11 DIAGNOSIS — Z79899 Other long term (current) drug therapy: Secondary | ICD-10-CM | POA: Insufficient documentation

## 2014-12-11 DIAGNOSIS — E119 Type 2 diabetes mellitus without complications: Secondary | ICD-10-CM | POA: Diagnosis not present

## 2014-12-11 DIAGNOSIS — F17210 Nicotine dependence, cigarettes, uncomplicated: Secondary | ICD-10-CM | POA: Insufficient documentation

## 2014-12-11 DIAGNOSIS — M65342 Trigger finger, left ring finger: Secondary | ICD-10-CM | POA: Diagnosis not present

## 2014-12-11 DIAGNOSIS — Z79891 Long term (current) use of opiate analgesic: Secondary | ICD-10-CM | POA: Insufficient documentation

## 2014-12-11 DIAGNOSIS — F329 Major depressive disorder, single episode, unspecified: Secondary | ICD-10-CM | POA: Insufficient documentation

## 2014-12-11 HISTORY — DX: Other amnesia: R41.3

## 2014-12-11 HISTORY — PX: CYST REMOVAL HAND: SHX6279

## 2014-12-11 HISTORY — DX: Hypothyroidism, unspecified: E03.9

## 2014-12-11 HISTORY — DX: Unspecified dementia, unspecified severity, without behavioral disturbance, psychotic disturbance, mood disturbance, and anxiety: F03.90

## 2014-12-11 LAB — GLUCOSE, CAPILLARY
GLUCOSE-CAPILLARY: 107 mg/dL — AB (ref 65–99)
GLUCOSE-CAPILLARY: 102 mg/dL — AB (ref 65–99)

## 2014-12-11 SURGERY — REMOVAL, CYST, HAND
Anesthesia: Monitor Anesthesia Care | Site: Hand | Laterality: Left

## 2014-12-11 MED ORDER — MIDAZOLAM HCL 2 MG/2ML IJ SOLN: 1.0000 mg | INTRAMUSCULAR | Status: DC | PRN

## 2014-12-11 MED ORDER — ONDANSETRON HCL 4 MG/2ML IJ SOLN
INTRAMUSCULAR | Status: DC | PRN
  Administered 2014-12-11: 4 mg via INTRAVENOUS

## 2014-12-11 MED ORDER — SCOPOLAMINE 1 MG/3DAYS TD PT72: 1.0000 | MEDICATED_PATCH | Freq: Once | TRANSDERMAL | Status: DC | PRN

## 2014-12-11 MED ORDER — BUPIVACAINE-EPINEPHRINE 0.5% -1:200000 IJ SOLN
INTRAMUSCULAR | Status: DC | PRN
  Administered 2014-12-11: 2.5 mL

## 2014-12-11 MED ORDER — ONDANSETRON HCL 4 MG/2ML IJ SOLN
INTRAMUSCULAR | Status: AC
  Filled 2014-12-11: qty 2

## 2014-12-11 MED ORDER — BUPIVACAINE-EPINEPHRINE (PF) 0.5% -1:200000 IJ SOLN
INTRAMUSCULAR | Status: AC
  Filled 2014-12-11: qty 30

## 2014-12-11 MED ORDER — FENTANYL CITRATE (PF) 100 MCG/2ML IJ SOLN
50.0000 ug | INTRAMUSCULAR | Status: DC | PRN
  Administered 2014-12-11: 50 ug via INTRAVENOUS

## 2014-12-11 MED ORDER — LIDOCAINE HCL (PF) 1 % IJ SOLN
INTRAMUSCULAR | Status: AC
  Filled 2014-12-11: qty 30

## 2014-12-11 MED ORDER — GLYCOPYRROLATE 0.2 MG/ML IJ SOLN: 0.2000 mg | Freq: Once | INTRAMUSCULAR | Status: DC | PRN

## 2014-12-11 MED ORDER — EPHEDRINE SULFATE 50 MG/ML IJ SOLN
INTRAMUSCULAR | Status: AC
  Filled 2014-12-11: qty 1

## 2014-12-11 MED ORDER — LACTATED RINGERS IV SOLN
INTRAVENOUS | Status: DC
  Administered 2014-12-11: 09:00:00 via INTRAVENOUS

## 2014-12-11 MED ORDER — FENTANYL CITRATE (PF) 100 MCG/2ML IJ SOLN
INTRAMUSCULAR | Status: AC
  Filled 2014-12-11: qty 4

## 2014-12-11 MED ORDER — HYDROMORPHONE HCL 1 MG/ML IJ SOLN: 0.2500 mg | INTRAMUSCULAR | Status: DC | PRN

## 2014-12-11 MED ORDER — CEFAZOLIN SODIUM-DEXTROSE 2-3 GM-% IV SOLR
INTRAVENOUS | Status: AC
  Filled 2014-12-11: qty 50

## 2014-12-11 MED ORDER — LIDOCAINE HCL (CARDIAC) 20 MG/ML IV SOLN
INTRAVENOUS | Status: AC
  Filled 2014-12-11: qty 5

## 2014-12-11 MED ORDER — PROPOFOL 10 MG/ML IV BOLUS
INTRAVENOUS | Status: DC | PRN
Start: 2014-12-11 — End: 2014-12-11
  Administered 2014-12-11: 10 mg via INTRAVENOUS
  Administered 2014-12-11: 30 mg via INTRAVENOUS
  Administered 2014-12-11: 20 mg via INTRAVENOUS

## 2014-12-11 MED ORDER — LIDOCAINE HCL (PF) 1 % IJ SOLN
INTRAMUSCULAR | Status: DC | PRN
  Administered 2014-12-11: 2.5 mL

## 2014-12-11 MED ORDER — LIDOCAINE HCL (CARDIAC) 20 MG/ML IV SOLN
INTRAVENOUS | Status: DC | PRN
  Administered 2014-12-11: 25 mg via INTRAVENOUS

## 2014-12-11 MED ORDER — MEPERIDINE HCL 25 MG/ML IJ SOLN: 6.2500 mg | INTRAMUSCULAR | Status: DC | PRN

## 2014-12-11 MED ORDER — OXYCODONE HCL 5 MG/5ML PO SOLN: 5.0000 mg | Freq: Once | ORAL | Status: DC | PRN

## 2014-12-11 MED ORDER — ARTIFICIAL TEARS OP OINT
TOPICAL_OINTMENT | OPHTHALMIC | Status: AC
  Filled 2014-12-11: qty 3.5

## 2014-12-11 MED ORDER — LACTATED RINGERS IV SOLN: INTRAVENOUS | Status: DC

## 2014-12-11 MED ORDER — CEFAZOLIN SODIUM-DEXTROSE 2-3 GM-% IV SOLR
2.0000 g | INTRAVENOUS | Status: AC
  Administered 2014-12-11: 2 g via INTRAVENOUS

## 2014-12-11 MED ORDER — PROPOFOL 500 MG/50ML IV EMUL
INTRAVENOUS | Status: AC
  Filled 2014-12-11: qty 50

## 2014-12-11 MED ORDER — OXYCODONE HCL 5 MG PO TABS: 5.0000 mg | ORAL_TABLET | Freq: Once | ORAL | Status: DC | PRN

## 2014-12-11 SURGICAL SUPPLY — 44 items
BLADE MINI RND TIP GREEN BEAV (BLADE) IMPLANT
BLADE SURG 15 STRL LF DISP TIS (BLADE) ×2 IMPLANT
BLADE SURG 15 STRL SS (BLADE) ×4
BNDG CMPR 9X4 STRL LF SNTH (GAUZE/BANDAGES/DRESSINGS) ×2
BNDG COHESIVE 1X5 TAN STRL LF (GAUZE/BANDAGES/DRESSINGS) ×3 IMPLANT
BNDG COHESIVE 4X5 TAN STRL (GAUZE/BANDAGES/DRESSINGS) ×1 IMPLANT
BNDG CONFORM 2 STRL LF (GAUZE/BANDAGES/DRESSINGS) ×3 IMPLANT
BNDG ESMARK 4X9 LF (GAUZE/BANDAGES/DRESSINGS) ×3 IMPLANT
BNDG GAUZE ELAST 4 BULKY (GAUZE/BANDAGES/DRESSINGS) IMPLANT
BNDG PLASTER X FAST 3X3 WHT LF (CAST SUPPLIES) IMPLANT
BNDG PLSTR 9X3 FST ST WHT (CAST SUPPLIES)
CHLORAPREP W/TINT 26ML (MISCELLANEOUS) ×4 IMPLANT
CORDS BIPOLAR (ELECTRODE) ×3 IMPLANT
COVER BACK TABLE 60X90IN (DRAPES) ×4 IMPLANT
COVER MAYO STAND STRL (DRAPES) ×4 IMPLANT
CUFF TOURNIQUET SINGLE 18IN (TOURNIQUET CUFF) IMPLANT
DRAPE EXTREMITY T 121X128X90 (DRAPE) ×4 IMPLANT
DRAPE SURG 17X23 STRL (DRAPES) ×4 IMPLANT
DRSG EMULSION OIL 3X3 NADH (GAUZE/BANDAGES/DRESSINGS) ×4 IMPLANT
GAUZE SPONGE 4X4 12PLY STRL (GAUZE/BANDAGES/DRESSINGS) ×4 IMPLANT
GLOVE BIO SURGEON STRL SZ7.5 (GLOVE) ×4 IMPLANT
GLOVE BIOGEL PI IND STRL 7.0 (GLOVE) ×4 IMPLANT
GLOVE BIOGEL PI IND STRL 8 (GLOVE) ×2 IMPLANT
GLOVE BIOGEL PI INDICATOR 7.0 (GLOVE) ×6
GLOVE BIOGEL PI INDICATOR 8 (GLOVE) ×2
GLOVE ECLIPSE 6.5 STRL STRAW (GLOVE) ×8 IMPLANT
GOWN STRL REUS W/ TWL LRG LVL3 (GOWN DISPOSABLE) ×4 IMPLANT
GOWN STRL REUS W/TWL LRG LVL3 (GOWN DISPOSABLE) ×8
GOWN STRL REUS W/TWL XL LVL3 (GOWN DISPOSABLE) ×4 IMPLANT
NEEDLE HYPO 25X1 1.5 SAFETY (NEEDLE) ×4 IMPLANT
NS IRRIG 1000ML POUR BTL (IV SOLUTION) ×4 IMPLANT
PACK BASIN DAY SURGERY FS (CUSTOM PROCEDURE TRAY) ×4 IMPLANT
PADDING CAST ABS 4INX4YD NS (CAST SUPPLIES)
PADDING CAST ABS COTTON 4X4 ST (CAST SUPPLIES) IMPLANT
RUBBERBAND STERILE (MISCELLANEOUS) IMPLANT
STOCKINETTE 6  STRL (DRAPES) ×2
STOCKINETTE 6 STRL (DRAPES) ×2 IMPLANT
SUT VICRYL RAPIDE 4-0 (SUTURE) IMPLANT
SUT VICRYL RAPIDE 4/0 PS 2 (SUTURE) IMPLANT
SYR BULB 3OZ (MISCELLANEOUS) ×4 IMPLANT
SYRINGE 10CC LL (SYRINGE) ×4 IMPLANT
TOWEL OR 17X24 6PK STRL BLUE (TOWEL DISPOSABLE) ×4 IMPLANT
TOWEL OR NON WOVEN STRL DISP B (DISPOSABLE) ×4 IMPLANT
UNDERPAD 30X30 (UNDERPADS AND DIAPERS) ×1 IMPLANT

## 2014-12-11 NOTE — Op Note (Signed)
12/11/2014  9:39 AM  PATIENT:  [name]  63 y.o. female  PRE-OPERATIVE DIAGNOSIS:  Left RF flexor tendon sheath cyst  POST-OPERATIVE DIAGNOSIS:  Same  PROCEDURE:  Left RF flexor tendon sheath cyst excision and A1 pulley release  SURGEON: Kamaiyah Uselton A. Jamiere Gulas, MD  PHYSICIAN ASSISTANT: Kirsten Schrader, OPA-C  ANESTHESIA:  local and MAC  SPECIMENS:  None  DRAINS:   None  EBL:  less than 50 mL  PREOPERATIVE INDICATIONS:  [name] is a  63 y.o. female with L RF suspected retinacular cyst and associated triggering.  The risks benefits and alternatives were discussed with the patient preoperatively including but not limited to the risks of infection, bleeding, nerve injury, cardiopulmonary complications, the need for revision surgery, among others, and the patient verbalized understanding and consented to proceed.  OPERATIVE IMPLANTS: None  OPERATIVE PROCEDURE:  After receiving prophylactic antibiotics, the patient was escorted to the operative theatre and placed in a supine position.  A surgical "time-out" was performed during which the planned procedure, proposed operative site, and the correct patient identity were compared to the operative consent and agreement confirmed by the circulating nurse according to current facility policy.  The incision was marked and anesthetized with a mixture of lidocaine and Marcaine bearing epinephrine. Following application of a tourniquet to the operative extremity, the exposed skin was prepped with Chloraprep and draped in the usual sterile fashion.  The limb was exsanguinated with an Esmarch bandage and the tourniquet iMVernie [facility] Surg84%ery ChaMatthias HVernie [facility] Medi84%cal HiVernie Surgery Center Vernie [facility] / Av84%era WolvMVernie ProgrWomen'S & ChiVernie Joyce [facility] Medi84Vernie [facility] CenVernie [place] S84VernVernie [facility] Medi84%cal Vernie [facility] Vernie Johnson Vernie [facility] MVernie [facility] S84%Vernie [facility] AdvaVernie FlagsVernie [facility] 84%WinVernie [facility] ChamberlaiVernie [place]84%y HoVernie Western Stat84%e HoGrass Ranch [phone] [facility] Rehabilitatio84%n HMatthiaVernie Dr [facility]a84%Vernie [facility] EndoscVernie [facility] [place]84%e [phone]! Incthias HughQuitmaJackson Surgery Center LLCn LiMarland KitKenard Gowerhe20 er. The skin was incised sharply with a scalpel, subcutaneous tissues dissected with blunt spreading dissection. The A1 pulley was identified and there was a small, BB sized cysts on its volar surface.  The A1 pulley was split down the midline, coursing around the cyst and the cyst was entirely excised, including a portion of the pulley wall. It was passed off to the back table. It was quite typical in its appearance. After complete division of A1 pulley and a few crossing structures just proximal to it, the tendons were pulled into view and excisionally debrided of their thickened tenosynovium. The tendons were returned to the bed, the tourniquet released and the wound irrigated. Additional hemostasis wasn't necessary. The skin was closed with 4-0 Vicryl Rapide interrupted sutures and a light dressing was applied. She was taken to recovery room.  DISPOSITION: This patient will be discharged home today with typical instructions returning in 10-15 days.

## 2014-12-11 NOTE — Anesthesia Procedure Notes (Signed)
Procedure Name: MAC Date/Time: 12/11/2014 9:48 AM Performed by: Curly ShoresRAFT, Xavi Tomasik W Pre-anesthesia Checklist: Patient identified, Emergency Drugs available, Suction available and Patient being monitored Patient Re-evaluated:Patient Re-evaluated prior to inductionOxygen Delivery Method: Simple face mask Preoxygenation: Pre-oxygenation with 100% oxygen Intubation Type: IV induction Ventilation: Mask ventilation without difficulty Dental Injury: Teeth and Oropharynx as per pre-operative assessment

## 2014-12-11 NOTE — Discharge Instructions (Signed)
Discharge Instructions ° ° °You have a light dressing on your hand.  °You may begin gentle motion of your fingers and hand immediately, but you should not do any heavy lifting or gripping.  Elevate your hand to reduce pain & swelling of the digits.  Ice over the operative site may be helpful to reduce pain & swelling.  DO NOT USE HEAT. °Pain medicine has been prescribed for you.  °Use your medicine as needed over the first 48 hours, and then you can begin to taper your use. You may use Tylenol in place of your prescribed pain medication, but not IN ADDITION to it. °Leave the dressing in place until the third day after your surgery and then remove it, leaving it open to air.  °After the bandage has been removed you may shower, regularly washing the incision and letting the water run over it, but not submerging it (no swimming, soaking it in dishwater, etc.) °You may drive a car when you are off of prescription pain medications and can safely control your vehicle with both hands. °We will address whether therapy will be required or not when you return to the office. °You may have already made your follow-up appointment when we completed your preop visit.  If not, please call our office today or the next business day to make your return appointment for 10-15 days after surgery. ° ° °Please call [phone] during normal business hours or [phone] after hours for any problems. Including the following: ° °- excessive redness of the incisions °- drainage for more than 4 days °- fever of more than 101.5 F ° °*Please note that pain medications will not be refilled after hours or on weekends. °Post Anesthesia Home Care Instructions ° °Activity: °Get plenty of rest for the remainder of the day. A responsible adult should stay with you for 24 hours following the procedure.  °For the next 24 hours, DO NOT: °-Drive a car °-Operate machinery °-Drink alcoholic beverages °-Take any medication unless instructed by your  physician °-Make any legal decisions or sign important papers. ° °Meals: °Start with liquid foods such as gelatin or soup. Progress to regular foods as tolerated. Avoid greasy, spicy, heavy foods. If nausea and/or vomiting occur, drink only clear liquids until the nausea and/or vomiting subsides. Call your physician if vomiting continues. ° °Special Instructions/Symptoms: °Your throat may feel dry or sore from the anesthesia or the breathing tube placed in your throat during surgery. If this causes discomfort, gargle with warm salt water. The discomfort should disappear within 24 hours. ° °If you had a scopolamine patch placed behind your ear for the management of post- operative nausea and/or vomiting: ° °1. The medication in the patch is effective for 72 hours, after which it should be removed.  Wrap patch in a tissue and discard in the trash. Wash hands thoroughly with soap and water. °2. You may remove the patch earlier than 72 hours if you experience unpleasant side effects which may include dry mouth, dizziness or visual disturbances. °3. Avoid touching the patch. Wash your hands with soap and water after contact with the patch. °  ° °

## 2014-12-11 NOTE — Anesthesia Preprocedure Evaluation (Addendum)
Anesthesia Evaluation  Patient identified by MRN, date of birth, ID band Patient awake    Reviewed: Allergy & Precautions, NPO status , Patient's Chart, lab work & pertinent test results  Airway Mallampati: I  TM Distance: >3 FB Neck ROM: Full    Dental  (+) Upper Dentures, Dental Advisory Given   Pulmonary Current Smoker,    breath sounds clear to auscultation       Cardiovascular hypertension, Pt. on medications  Rhythm:Regular Rate:Normal     Neuro/Psych PSYCHIATRIC DISORDERS Depression    GI/Hepatic   Endo/Other  diabetes, Well Controlled, Oral Hypoglycemic AgentsHypothyroidism Morbid obesity  Renal/GU      Musculoskeletal   Abdominal   Peds  Hematology   Anesthesia Other Findings   Reproductive/Obstetrics                            Anesthesia Physical Anesthesia Plan  ASA: III  Anesthesia Plan: MAC   Post-op Pain Management:    Induction: Intravenous  Airway Management Planned: Simple Face Mask  Additional Equipment:   Intra-op Plan:   Post-operative Plan:   Informed Consent: I have reviewed the patients History and Physical, chart, labs and discussed the procedure including the risks, benefits and alternatives for the proposed anesthesia with the patient or authorized representative who has indicated his/her understanding and acceptance.   Dental advisory given  Plan Discussed with: CRNA, Anesthesiologist and Surgeon  Anesthesia Plan Comments:         Anesthesia Quick Evaluation

## 2014-12-11 NOTE — Interval H&P Note (Signed)
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 After consideration of risks, benefits and other options for treatment, the patient has consented to  Procedure(s): LEFT RING FINGER TENDON SHEATH CYST EXCISION (Left) as a surgical intervention .  The patient's history has been reviewed, patient examined, no change in status, stable for surgery.  I have reviewed the patient's chart and labs.  Questions were answered to the patient's satisfaction.     Chandria Rookstool A.

## 2014-12-11 NOTE — Transfer of Care (Signed)
Immediate Anesthesia Transfer of Care Note  Patient: [name]  Procedure(s) Performed: Procedure(s): LEFT RING FINGER TENDON SHEATH CYST EXCISION (Left)  Patient Location: PACU  Anesthesia Type:MAC  Level of Consciousness: awake, alert , oriented and patient cooperative  Airway & Oxygen Therapy: Patient Spontanous Breathing and Patient connected to face mask oxygen  Post-op Assessment: Report given to RN, Post -op Vital signs reviewed and stable and Patient moving all extremities  Post vital signs: Reviewed and stable  Last Vitals:  Filed Vitals:   12/11/14 0839  BP: 142/74  Pulse: 68  Temp: 36.8 C  Resp: 20    Complications: No apparent anesthesia complications

## 2014-12-11 NOTE — Anesthesia Postprocedure Evaluation (Signed)
  Anesthesia Post-op Note  Patient: [name]  Procedure(s) Performed: Procedure(s): LEFT RING FINGER TENDON SHEATH CYST EXCISION (Left)  Patient Location: PACU  Anesthesia Type: MAC   Level of Consciousness: awake, alert  and oriented  Airway and Oxygen Therapy: Patient Spontanous Breathing  Post-op Pain: mild  Post-op Assessment: Post-op Vital signs reviewed  Post-op Vital Signs: Reviewed  Last Vitals:  Filed Vitals:   12/11/14 1013  BP:   Pulse: 58  Temp:   Resp: 13    Complications: No apparent anesthesia complications

## 2014-12-12 ENCOUNTER — Encounter (HOSPITAL_BASED_OUTPATIENT_CLINIC_OR_DEPARTMENT_OTHER): Payer: Self-pay | Admitting: Orthopedic Surgery

## 2014-12-14 ENCOUNTER — Ambulatory Visit (HOSPITAL_COMMUNITY): Payer: Medicare Other | Admitting: Clinical

## 2014-12-14 ENCOUNTER — Ambulatory Visit: Payer: Medicare Other | Admitting: Dietician

## 2015-01-15 ENCOUNTER — Other Ambulatory Visit: Payer: Self-pay

## 2015-01-15 DIAGNOSIS — R413 Other amnesia: Secondary | ICD-10-CM

## 2015-01-15 MED ORDER — DONEPEZIL HCL 5 MG PO TABS: 5.0000 mg | ORAL_TABLET | Freq: Every day | ORAL | Status: DC

## 2015-04-13 ENCOUNTER — Other Ambulatory Visit: Payer: Self-pay | Admitting: Neurology

## 2015-04-13 NOTE — Telephone Encounter (Signed)
Last OV: 11/03/14 Next OV: 05/11/15 0

## 2015-05-02 ENCOUNTER — Ambulatory Visit: Payer: Medicare Other | Admitting: Neurology

## 2015-05-04 ENCOUNTER — Ambulatory Visit (INDEPENDENT_AMBULATORY_CARE_PROVIDER_SITE_OTHER): Payer: Medicare Other | Admitting: Neurology

## 2015-05-04 ENCOUNTER — Encounter: Payer: Self-pay | Admitting: Neurology

## 2015-05-04 VITALS — BP 132/76 | HR 75 | Ht 65.0 in | Wt 202.0 lb

## 2015-05-04 DIAGNOSIS — F17200 Nicotine dependence, unspecified, uncomplicated: Secondary | ICD-10-CM

## 2015-05-04 DIAGNOSIS — F0280 Dementia in other diseases classified elsewhere without behavioral disturbance: Secondary | ICD-10-CM

## 2015-05-04 DIAGNOSIS — F329 Major depressive disorder, single episode, unspecified: Secondary | ICD-10-CM

## 2015-05-04 DIAGNOSIS — G300 Alzheimer's disease with early onset: Secondary | ICD-10-CM

## 2015-05-04 DIAGNOSIS — F32A Depression, unspecified: Secondary | ICD-10-CM

## 2015-05-04 MED ORDER — DONEPEZIL HCL 10 MG PO TABS: 10.0000 mg | ORAL_TABLET | Freq: Every day | ORAL | Status: DC

## 2015-05-04 NOTE — Progress Notes (Signed)
NEUROLOGY FOLLOW UP OFFICE NOTE  [name] [id number]  HISTORY OF PRESENT ILLNESS: [name] is a 65 year old right-handed woman with type 2 diabetes, depression, hypertension, cerebrovascular disease and hyperlipidemia who follows up for memory deficits.  She is accompanied by her neighbor who provides some history.  Neuropsych report reviewed.  UPDATE: She is currently taking Aricept  at bedtime.  The dose was never increased to  at bedtime.  She was supposed to have had a Home Health assessment, but I am not aware of it.  Apparently, somebody from her insurance came by to ensure she was taking her medication but there was no formal assessment.  She lives with her boyfriend, but he will be moving out.  She lives across the hall from her best friend, who will soon be moving back to [place].  At that point, she will be living here by herself.  She has a first cousin in [place] and she is considering moving back there to be near family.  She is understandably depressed and tearful.  She is afraid to be alone.  She does report that she has had thoughts of suicide but has no plan to hurt herself, nor does she want to harm herself.  She said she is too afraid to every attempt it.  She reports that she has been forgetting to pay some bills.  Driving has overall been okay.  She has not had any accidents or near-accidents.  She uses GPS, however she got lost once despite the GPS.  She usually is able to drive on familiar routes such as to [employer] or [employer].  She sometimes forgets to take her medication despite use of the Divvydose box.  She feels a little off balance but has not had any falls.  HISTORY: She began noticing problems with memory a couple of months after her mother passed away from pancreatic cancer in February 2009.  She was very close to her mother and this was devastating to her.  She has suffered from depression since then.  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  She began to notice difficulty remembering how to perform tasks for work that she had done for many years.  She was able to hide her deficits from her bosses because she would ask others for help.  Her memory problems began to progress from there.  Often, she will walk into a room and forget what she was looking for.  She needs to bring a list to the grocery store.  She forgets names of people she just meets 5 minutes ago.  She reports that she often forgets to pay bills on time.  However, she denies problems with actually balancing her checkbook or correctly making calculations.  She sometimes forgets what she wants to say in conversation but denies word-finding difficulties.  On at least one occasion, she had to pull over on a familiar route in order to re-orient herself.  She denies trouble remembering names or faces of friends or family.  She denies remote memory problems.  She currently is on disability due to her memory problems and chronic back pain.  She has significant depression and is often tearful.  She reports that she is withdrawn and does not socialize much.  She is a Engineer, maintenance (IT).  She lives with her boyfriend.  She denies family history of memory deficits.  MRI of the brain with and without contrast performed  on 02/22/14 showed mild chronic small vessel ischemic changes with old lacunar cerebellar infarcts.  She underwent neuropsychological testing on 10/11/14.  The impression of the test was an unspecified mild dementia with behavioral disturbance, which may be Alzheimer's disease.  However, possible suboptimal performance effort due to depression and post-traumatic stress, was also considered to play a factor.  Performance demonstrated declines in semantic knowledge, aspects of executive function, processing speed, visuoconstruction, and memory retrieval and recognition, which may be seen with Alzheimer's disease.  PAST MEDICAL HISTORY: Past Medical History    Diagnosis Date  . Hypertension   . Diabetes mellitus   . Pancreatitis 2010  . Idiopathic acute pancreatitis 09/16/2011  . Trigger finger   . Hypothyroidism   . Dementia   . Memory loss     MEDICATIONS: Current Outpatient Prescriptions on File Prior to Visit  Medication Sig Dispense Refill  . acetaminophen (TYLENOL) 325 MG tablet Take 325 mg by mouth every 6 (six) hours as needed.    . ALPRAZolam (XANAX) 0.5 MG tablet Take 0.5 mg by mouth daily.     Marland Kitchen amLODipine (NORVASC) 5 MG tablet Take 5 mg by mouth daily.    Marland Kitchen aspirin EC 81 MG tablet Take 81 mg by mouth daily.      Marland Kitchen atorvastatin (LIPITOR) 40 MG tablet Take 40 mg by mouth daily.    Marland Kitchen CALCIUM PO Take 1 tablet by mouth daily.    Marland Kitchen donepezil (ARICEPT) 5 MG tablet TAKE ONE TABLET BY MOUTH AT BEDTIME 28 tablet 1  . HYDROcodone-acetaminophen (NORCO/VICODIN) 5-325 MG per tablet     . levothyroxine (SYNTHROID, LEVOTHROID) 50 MCG tablet Take 1 tablet (50 mcg total) by mouth daily. 90 tablet 1  . lisinopril (PRINIVIL,ZESTRIL) 20 MG tablet TAKE 1 TABLET (20 MG TOTAL) BY MOUTH DAILY. 90 tablet 0  . metFORMIN (GLUCOPHAGE) 500 MG tablet TAKE ONE TABLET BY MOUTH ONCE DAILY 30 tablet 1  . mirtazapine (REMERON) 15 MG tablet Take 15 mg by mouth daily as needed (depressive disorder).    . multivitamin-iron-minerals-folic acid (CENTRUM) chewable tablet Chew 1 tablet by mouth daily.    . sertraline (ZOLOFT) 100 MG tablet Take 1 tablet (100 mg total) by mouth 2 (two) times daily. 60 tablet 2   No current facility-administered medications on file prior to visit.    ALLERGIES: No Known Allergies  FAMILY HISTORY: Family History  Problem Relation Age of Onset  . Cancer Mother   . Alcohol abuse Father     SOCIAL HISTORY: Social History   Social History  . Marital Status: Single    Spouse Name: N/A  . Number of Children: N/A  . Years of Education: N/A   Occupational History  . Not on file.   Social History Main Topics  . Smoking  status: Current Every Day Smoker -- 0.50 packs/day    Types: Cigarettes  . Smokeless tobacco: Never Used     Comment: still in stress. looking for job.   . Alcohol Use: No  . Drug Use: No  . Sexual Activity: Not on file   Other Topics Concern  . Not on file   Social History Narrative   Lives with boyfriend.  No children.  Retired from [employer].  Education: 2 years of college.    REVIEW OF SYSTEMS: Constitutional: No fevers, chills, or sweats, no generalized fatigue, change in appetite Eyes: No visual changes, double vision, eye pain Ear, nose and throat: No hearing loss, ear pain, nasal congestion, sore throat  Cardiovascular: No chest pain, palpitations Respiratory:  No shortness of breath at rest or with exertion, wheezes GastrointestinaI: No nausea, vomiting, diarrhea, abdominal pain, fecal incontinence Genitourinary:  No dysuria, urinary retention or frequency Musculoskeletal:  No neck pain, back pain Integumentary: No rash, pruritus, skin lesions Neurological: as above Psychiatric: depression Endocrine: No palpitations, fatigue, diaphoresis, mood swings, change in appetite, change in weight, increased thirst Hematologic/Lymphatic:  No anemia, purpura, petechiae. Allergic/Immunologic: no itchy/runny eyes, nasal congestion, recent allergic reactions, rashes  PHYSICAL EXAM: Filed Vitals:   05/04/15 1330  BP: 132/76  Pulse: 75   General: No acute distress.  Patient appears well-groomed.  normal body habitus. Head:  Normocephalic/atraumatic Eyes:  Fundoscopic exam unremarkable without vessel changes, exudates, hemorrhages or papilledema. Neck: supple, no paraspinal tenderness, full range of motion Heart:  Regular rate and rhythm Lungs:  Clear to auscultation bilaterally Back: No paraspinal tenderness Neurological Exam: alert and oriented to person, place, and time. Attention span and concentration intact except difficult with serial 7 subtraction, recent memory  poor, remote memory intact, fund of knowledge intact.  Speech fluent and not dysarthric, language intact except poor naming fluency.   Montreal Cognitive Assessment  05/04/2015 07/03/2014  Visuospatial/ Executive (0/5) 3 3  Naming (0/3) 3 3  Attention: Read list of digits (0/2) 2 2  Attention: Read list of letters (0/1) 1 1  Attention: Serial 7 subtraction starting at 100 (0/3) 1 3  Language: Repeat phrase (0/2) 2 2  Language : Fluency (0/1) 0 1  Abstraction (0/2) 2 0  Delayed Recall (0/5) 0 0  Orientation (0/6) 6 5  Total 20 20  Adjusted Score (based on education) 20 20   CN II-XII intact. Fundoscopic exam unremarkable without vessel changes, exudates, hemorrhages or papilledema.  Bulk and tone normal, muscle strength 5/5 throughout.  Sensation to light touch, temperature and vibration intact.  Deep tendon reflexes 2+ throughout.  Finger to nose and heel to shin testing intact.  Gait normal.  IMPRESSION: Early-onset Alzheimer's dementia. Tobacco use  I am concerned about Ms. Buth's safety.  I don't think she should be living by herself.  She is forgetting to pay some bills and sometimes forgets to take medication.  Overall, her driving has been without incident and she uses GPS, however she did have one episode where she got lost and didn't think to use the GPS.  She is increasingly more depressed.  Since she will soon be here in [place] by herself, she is afraid.  She has had thoughts that she would rather not live alone with this disease, but she adamantly denies wanting or having any plan to commit suicide.  She says she would be too scared to every do that.  PLAN: 1.  Increase Aricept to 10mg  at bedtime 2.  We will make sure that she has a Home Health visit.  I would like a Child psychotherapistsocial worker to go over and determine her safety and whether she needs another level of care.  Given that she is alone here, we discussed that it may be the best idea to move back to [place] to live near  family. 3.  I also want her to undergo formal driving evaluation with the DMV 4.  We will refer her to psychiatry and psychotherapy 5.  Smoking cessation 6.  Follow up in 6 months.  33 minutes spent face to face with patient, over 50% spent discussing management.  Shon MilletAdam Jaffe, DO  CC:  Kirby FunkJohn Griffin, MD

## 2015-05-04 NOTE — Patient Instructions (Signed)
1.  Increase Aricept to 10mg  at bedtime 2.  Refer to psychiatry and psychotherapy 3.  Home Health Assessment.  I think you should not be living on your own anymore 4.  [facility] 5.  Follow up in 6 months.

## 2015-05-04 NOTE — Progress Notes (Signed)
Chart forwarded.  

## 2015-05-08 ENCOUNTER — Telehealth: Payer: Self-pay | Admitting: Neurology

## 2015-05-08 ENCOUNTER — Other Ambulatory Visit: Payer: Self-pay | Admitting: Family Medicine

## 2015-05-08 NOTE — Telephone Encounter (Signed)
VM-PT left message saying she was looking over her AVS and noticed in the notes that it said she should not be living alone and wanted to let you know that she has a roommate and does not live alone/Dawn CB# [phone]

## 2015-05-08 NOTE — Telephone Encounter (Signed)
Spoke with patient. She is doing well she says. Has seen home health, will come back next week to do complete evaluation. Pt had no questions or concerns today.

## 2015-05-09 ENCOUNTER — Ambulatory Visit: Payer: Medicare Other | Admitting: Neurology

## 2015-05-10 ENCOUNTER — Telehealth: Payer: Self-pay | Admitting: Neurology

## 2015-05-10 NOTE — Telephone Encounter (Signed)
Shannon KohYvonne Reid [birth year]. [name] a nurse from [facility] called this morning and said that Shannon BuddyYvonne wanted to reschedule her visit with her from today to tomorrow, due to having another Dr. AstronomerAppointment.  Shannon SetaHeather was saying that she would need a new Start of Care Order. [name]'s number is [phone]. Thank you

## 2015-05-10 NOTE — Telephone Encounter (Signed)
Spoke with Herbert SetaHeather, verbal given.

## 2015-05-11 ENCOUNTER — Ambulatory Visit: Payer: Medicare Other | Admitting: Neurology

## 2015-05-15 ENCOUNTER — Telehealth: Payer: Self-pay | Admitting: Neurology

## 2015-05-15 NOTE — Telephone Encounter (Signed)
VM-PT left message in regards to having an evaluation for driving done/Dawn CB# [phone] or [phone]

## 2015-05-15 NOTE — Telephone Encounter (Signed)
Left message w/ man that answered phone for pt to return my call.

## 2015-05-16 NOTE — Telephone Encounter (Signed)
Spoke to patient. She is upset about the driving evaluation, said we were trying to take her driver's license. Explained to pt that we are not trying to take her driver's license, just want to assure that she is driving safely. Pt did state that was discussed during her visit. Pt also retold story of getting lost while driving in [place]. Pt did also say that home health had been out a few times, but she told them she did not need any further assistance.

## 2015-06-07 ENCOUNTER — Telehealth: Payer: Self-pay | Admitting: Neurology

## 2015-06-07 NOTE — Telephone Encounter (Signed)
VM-PT left a message to have a call back at [phone]/Dawn

## 2015-06-08 NOTE — Telephone Encounter (Signed)
Pt was concerned about Driving Eval. Spoke to patient about the evaluation. Pt sounded calmed by the end of the conversation. Pt thought they were going to take her liscenes w/o having a driving test.

## 2015-06-27 ENCOUNTER — Inpatient Hospital Stay (HOSPITAL_COMMUNITY)
Admission: EM | Admit: 2015-06-27 | Discharge: 2015-07-03 | DRG: 065 | Disposition: A | Discharge status: Skilled Nursing Facility | Payer: Medicare Other | Attending: Internal Medicine | Admitting: Internal Medicine

## 2015-06-27 ENCOUNTER — Encounter (HOSPITAL_COMMUNITY): Payer: Self-pay

## 2015-06-27 ENCOUNTER — Emergency Department (HOSPITAL_COMMUNITY): Payer: Medicare Other

## 2015-06-27 DIAGNOSIS — I10 Essential (primary) hypertension: Secondary | ICD-10-CM | POA: Diagnosis not present

## 2015-06-27 DIAGNOSIS — G300 Alzheimer's disease with early onset: Secondary | ICD-10-CM | POA: Diagnosis not present

## 2015-06-27 DIAGNOSIS — F419 Anxiety disorder, unspecified: Secondary | ICD-10-CM | POA: Diagnosis present

## 2015-06-27 DIAGNOSIS — R1110 Vomiting, unspecified: Secondary | ICD-10-CM | POA: Insufficient documentation

## 2015-06-27 DIAGNOSIS — R61 Generalized hyperhidrosis: Secondary | ICD-10-CM

## 2015-06-27 DIAGNOSIS — I6340 Cerebral infarction due to embolism of unspecified cerebral artery: Principal | ICD-10-CM | POA: Diagnosis present

## 2015-06-27 DIAGNOSIS — Z8673 Personal history of transient ischemic attack (TIA), and cerebral infarction without residual deficits: Secondary | ICD-10-CM | POA: Diagnosis present

## 2015-06-27 DIAGNOSIS — F0280 Dementia in other diseases classified elsewhere without behavioral disturbance: Secondary | ICD-10-CM | POA: Diagnosis present

## 2015-06-27 DIAGNOSIS — F02818 Dementia in other diseases classified elsewhere, unspecified severity, with other behavioral disturbance: Secondary | ICD-10-CM | POA: Diagnosis present

## 2015-06-27 DIAGNOSIS — R112 Nausea with vomiting, unspecified: Secondary | ICD-10-CM

## 2015-06-27 DIAGNOSIS — E1165 Type 2 diabetes mellitus with hyperglycemia: Secondary | ICD-10-CM | POA: Diagnosis present

## 2015-06-27 DIAGNOSIS — R531 Weakness: Secondary | ICD-10-CM | POA: Diagnosis not present

## 2015-06-27 DIAGNOSIS — Z6833 Body mass index (BMI) 33.0-33.9, adult: Secondary | ICD-10-CM

## 2015-06-27 DIAGNOSIS — Z7982 Long term (current) use of aspirin: Secondary | ICD-10-CM

## 2015-06-27 DIAGNOSIS — E1149 Type 2 diabetes mellitus with other diabetic neurological complication: Secondary | ICD-10-CM

## 2015-06-27 DIAGNOSIS — M549 Dorsalgia, unspecified: Secondary | ICD-10-CM | POA: Diagnosis present

## 2015-06-27 DIAGNOSIS — R0602 Shortness of breath: Secondary | ICD-10-CM | POA: Diagnosis not present

## 2015-06-27 DIAGNOSIS — R278 Other lack of coordination: Secondary | ICD-10-CM | POA: Diagnosis present

## 2015-06-27 DIAGNOSIS — F331 Major depressive disorder, recurrent, moderate: Secondary | ICD-10-CM | POA: Diagnosis present

## 2015-06-27 DIAGNOSIS — R29701 NIHSS score 1: Secondary | ICD-10-CM | POA: Diagnosis present

## 2015-06-27 DIAGNOSIS — F329 Major depressive disorder, single episode, unspecified: Secondary | ICD-10-CM | POA: Diagnosis present

## 2015-06-27 DIAGNOSIS — E440 Moderate protein-calorie malnutrition: Secondary | ICD-10-CM | POA: Diagnosis present

## 2015-06-27 DIAGNOSIS — E039 Hypothyroidism, unspecified: Secondary | ICD-10-CM | POA: Diagnosis present

## 2015-06-27 DIAGNOSIS — I639 Cerebral infarction, unspecified: Secondary | ICD-10-CM | POA: Insufficient documentation

## 2015-06-27 DIAGNOSIS — G8929 Other chronic pain: Secondary | ICD-10-CM | POA: Diagnosis present

## 2015-06-27 DIAGNOSIS — E876 Hypokalemia: Secondary | ICD-10-CM | POA: Diagnosis not present

## 2015-06-27 DIAGNOSIS — Z79899 Other long term (current) drug therapy: Secondary | ICD-10-CM

## 2015-06-27 DIAGNOSIS — A084 Viral intestinal infection, unspecified: Secondary | ICD-10-CM | POA: Diagnosis present

## 2015-06-27 DIAGNOSIS — E113299 Type 2 diabetes mellitus with mild nonproliferative diabetic retinopathy without macular edema, unspecified eye: Secondary | ICD-10-CM | POA: Diagnosis present

## 2015-06-27 DIAGNOSIS — R42 Dizziness and giddiness: Secondary | ICD-10-CM | POA: Insufficient documentation

## 2015-06-27 DIAGNOSIS — F17210 Nicotine dependence, cigarettes, uncomplicated: Secondary | ICD-10-CM | POA: Diagnosis present

## 2015-06-27 DIAGNOSIS — E785 Hyperlipidemia, unspecified: Secondary | ICD-10-CM | POA: Diagnosis present

## 2015-06-27 DIAGNOSIS — N179 Acute kidney failure, unspecified: Secondary | ICD-10-CM | POA: Diagnosis present

## 2015-06-27 DIAGNOSIS — Z720 Tobacco use: Secondary | ICD-10-CM | POA: Diagnosis present

## 2015-06-27 DIAGNOSIS — Z7984 Long term (current) use of oral hypoglycemic drugs: Secondary | ICD-10-CM

## 2015-06-27 HISTORY — DX: Tobacco use: Z72.0

## 2015-06-27 LAB — I-STAT TROPONIN, ED: Troponin i, poc: 0.01 ng/mL (ref 0.00–0.08)

## 2015-06-27 LAB — COMPREHENSIVE METABOLIC PANEL
ALBUMIN: 3.7 g/dL (ref 3.5–5.0)
ALT: 19 U/L (ref 14–54)
ANION GAP: 16 — AB (ref 5–15)
AST: 32 U/L (ref 15–41)
Alkaline Phosphatase: 78 U/L (ref 38–126)
BILIRUBIN TOTAL: 0.7 mg/dL (ref 0.3–1.2)
BUN: 16 mg/dL (ref 6–20)
CHLORIDE: 107 mmol/L (ref 101–111)
CO2: 14 mmol/L — ABNORMAL LOW (ref 22–32)
Calcium: 9.6 mg/dL (ref 8.9–10.3)
Creatinine, Ser: 1.26 mg/dL — ABNORMAL HIGH (ref 0.44–1.00)
GFR calc Af Amer: 51 mL/min — ABNORMAL LOW (ref >60)
GFR calc non Af Amer: 44 mL/min — ABNORMAL LOW (ref >60)
GLUCOSE: 237 mg/dL — AB (ref 65–99)
POTASSIUM: 2.5 mmol/L — AB (ref 3.5–5.1)
Sodium: 137 mmol/L (ref 135–145)
TOTAL PROTEIN: 8 g/dL (ref 6.5–8.1)

## 2015-06-27 LAB — CBC
HEMATOCRIT: 39.7 % (ref 36.0–46.0)
HEMOGLOBIN: 13.1 g/dL (ref 12.0–15.0)
MCH: 27.8 pg (ref 26.0–34.0)
MCHC: 33 g/dL (ref 30.0–36.0)
MCV: 84.3 fL (ref 78.0–100.0)
Platelets: 183 10*3/uL (ref 150–400)
RBC: 4.71 MIL/uL (ref 3.87–5.11)
RDW: 16.1 % — ABNORMAL HIGH (ref 11.5–15.5)
WBC: 9.1 10*3/uL (ref 4.0–10.5)

## 2015-06-27 LAB — DIFFERENTIAL
BASOS ABS: 0 10*3/uL (ref 0.0–0.1)
Basophils Relative: 0 %
EOS ABS: 0.1 10*3/uL (ref 0.0–0.7)
Eosinophils Relative: 1 %
LYMPHS ABS: 3.8 10*3/uL (ref 0.7–4.0)
LYMPHS PCT: 42 %
MONOS PCT: 9 %
Monocytes Absolute: 0.8 10*3/uL (ref 0.1–1.0)
NEUTROS ABS: 4.4 10*3/uL (ref 1.7–7.7)
Neutrophils Relative %: 48 %

## 2015-06-27 LAB — CBG MONITORING, ED: GLUCOSE-CAPILLARY: 265 mg/dL — AB (ref 65–99)

## 2015-06-27 LAB — I-STAT CHEM 8, ED
BUN: 21 mg/dL — AB (ref 6–20)
CREATININE: 1.1 mg/dL — AB (ref 0.44–1.00)
Calcium, Ion: 1.1 mmol/L — ABNORMAL LOW (ref 1.13–1.30)
Chloride: 107 mmol/L (ref 101–111)
GLUCOSE: 245 mg/dL — AB (ref 65–99)
HCT: 43 % (ref 36.0–46.0)
HEMOGLOBIN: 14.6 g/dL (ref 12.0–15.0)
POTASSIUM: 2.6 mmol/L — AB (ref 3.5–5.1)
Sodium: 143 mmol/L (ref 135–145)
TCO2: 15 mmol/L (ref 0–100)

## 2015-06-27 LAB — D-DIMER, QUANTITATIVE (NOT AT ARMC): D DIMER QUANT: 0.35 ug{FEU}/mL (ref 0.00–0.50)

## 2015-06-27 LAB — APTT: APTT: 27 s (ref 24–37)

## 2015-06-27 LAB — PROTIME-INR
INR: 1.17 (ref 0.00–1.49)
Prothrombin Time: 15.1 seconds (ref 11.6–15.2)

## 2015-06-27 LAB — ETHANOL: Alcohol, Ethyl (B): <5 mg/dL (ref <5)

## 2015-06-27 MED ORDER — IOPAMIDOL (ISOVUE-300) INJECTION 61%
INTRAVENOUS | Status: AC
  Filled 2015-06-27: qty 100

## 2015-06-27 MED ORDER — POTASSIUM CHLORIDE IN NACL 20-0.9 MEQ/L-% IV SOLN
Freq: Once | INTRAVENOUS | Status: AC
  Administered 2015-06-27: via INTRAVENOUS
  Filled 2015-06-27: qty 1000

## 2015-06-27 MED ORDER — SODIUM CHLORIDE 0.9 % IV BOLUS (SEPSIS)
1000.0000 mL | Freq: Once | INTRAVENOUS | Status: AC
  Administered 2015-06-27: 1000 mL via INTRAVENOUS

## 2015-06-27 MED ORDER — ONDANSETRON HCL 4 MG/2ML IJ SOLN
4.0000 mg | Freq: Once | INTRAMUSCULAR | Status: AC
  Administered 2015-06-27: 4 mg via INTRAVENOUS

## 2015-06-27 MED ORDER — PROCHLORPERAZINE EDISYLATE 5 MG/ML IJ SOLN
10.0000 mg | Freq: Once | INTRAMUSCULAR | Status: AC
  Administered 2015-06-27: 10 mg via INTRAVENOUS
  Filled 2015-06-27: qty 2

## 2015-06-27 NOTE — ED Notes (Signed)
Per [facility] pt was walking up stairs around 1950 today with her boyfriend when she started to be become diaphoretic, nauseated, and started feeling dizzy. Pt was also started feeling like she could not breathe. Pt had a left gaze with EMS and "slow speech". Pt vomited two times with EMS, and once in CT. Pt had clear lung sounds bilaterally. Pt is diabetic and did not eat today.

## 2015-06-27 NOTE — H&P (Addendum)
History and Physical    [name]:[id number] DOB: Oct 29, 1950 DOA: 06/27/2015  Referring MD/NP/PA:   PCP: Lillia Mountain, MD   Patient coming from:  The patient is coming from home.  At baseline, pt is independent for most of ADL.       Chief Complaint: Dizziness, left gaze, slow speech, diaphoresis, nausea, vomiting  HPI: [name] is a 65 y.o. female with medical history significant of hypertension, hyperlipidemia, diabetes mellitus, hypothyroidism, depression, anxiety, tobacco abuse, dementia, pancreatitis, who presents with dizziness, left gaze, slow speech, diaphoresis, nausea, vomiting.  Patient has dementia and medical history is limited. The history is also obtained by discussing the case with ED physician, per EMS report, and with the nursing staff. Pt states that she started feeling dizzy when she was walking up stairs around 1950 today. Per her boyfriend, pt was diaphoretic and nauseated. She vomited at least 6 times per RN. Per EMS report, pt had left gaze and "slow speech". There was no loss of consciousness. She also reports double vision. No hearing loss or ear ringing. She has generalized weakness, but no unilateral weakness or numbness in extremities. Patient denies chest pain, shortness breath, cough, abdominal pain, diarrhea, symptoms of UTI.  ED Course: pt was found to have alcohol level less than 5, negative urinalysis, WBC 9.1, INR 1.17, negative troponin, temperature normal, no tachycardia, mild tachypnea, potassium 2.5, acute renal injury with creatinine 1.26. CT head is negative for acute intracranial abnormalities, but showed small lacunar infarct at the right cerebellar hemisphere. Pt is placed on tele bed for obs. Neurology was consulted by EDP.  Review of Systems:   General: no fevers, chills, no changes in body weight, has poor appetite, has fatigue HEENT: no blurry vision, hearing changes or sore throat Pulm: no dyspnea, coughing, wheezing CV:  no chest pain, no palpitations Abd: has nausea, vomiting, no abdominal pain, diarrhea, constipation GU: no dysuria, burning on urination, increased urinary frequency, hematuria  Ext: no leg edema Neuro: no unilateral weakness, numbness, or tingling. Has dizziness and double vision Skin: no rash MSK: No muscle spasm, no deformity, no limitation of range of movement in spin Heme: No easy bruising.  Travel history: No recent long distant travel.  Allergy: No Known Allergies  Past Medical History  Diagnosis Date  . Hypertension   . Diabetes mellitus   . Pancreatitis 2010  . Idiopathic acute pancreatitis 09/16/2011  . Trigger finger   . Hypothyroidism   . Dementia   . Memory loss   . Tobacco abuse     Past Surgical History  Procedure Laterality Date  . Cholecystectomy  2003  . Spine surgery  2006    Dr. Trey Sailors - L3-L4, L4-L5 laminotomy and foraminotomy  . Back surgery    . Cyst removal hand Left 12/11/2014    Procedure: LEFT RING FINGER TENDON SHEATH CYST EXCISION;  Surgeon: Mack Hook, MD;  Location: [facility];  Service: Orthopedics;  Laterality: Left;    Social History:  reports that she has been smoking Cigarettes.  She has been smoking about 0.50 packs per day. She has never used smokeless tobacco. She reports that she does not drink alcohol or use illicit drugs.  Family History:  Family History  Problem Relation Age of Onset  . Cancer Mother   . Alcohol abuse Father      Prior to Admission medications   Medication Sig Start Date End Date Taking? Authorizing Provider  amLODipine (NORVASC) 5 MG tablet  Take 5 mg by mouth daily.   Yes Historical Provider, MD  atorvastatin (LIPITOR) 40 MG tablet Take 40 mg by mouth daily. 06/13/14  Yes Historical Provider, MD  donepezil (ARICEPT) 10 MG tablet Take 1 tablet (10 mg total) by mouth at bedtime. 05/04/15  Yes Adam Mliss Fritz, DO  levothyroxine (SYNTHROID, LEVOTHROID) 50 MCG tablet Take 1 tablet (50 mcg total) by  mouth daily. 11/24/14  Yes Uvaldo Rising, MD  metFORMIN (GLUCOPHAGE) 500 MG tablet TAKE ONE TABLET BY MOUTH ONCE DAILY 04/03/14  Yes Myra Rude, MD  mirtazapine (REMERON) 15 MG tablet Take 15 mg by mouth daily as needed (depressive disorder).   Yes Historical Provider, MD  sertraline (ZOLOFT) 100 MG tablet Take 1 tablet (100 mg total) by mouth 2 (two) times daily. 11/21/13  Yes Myra Rude, MD  acetaminophen (TYLENOL) 325 MG tablet Take 325 mg by mouth every 6 (six) hours as needed.    Historical Provider, MD  ALPRAZolam Prudy Feeler) 0.5 MG tablet Take 0.5 mg by mouth daily.     Historical Provider, MD  aspirin EC 81 MG tablet Take 81 mg by mouth daily.      Historical Provider, MD  CALCIUM PO Take 1 tablet by mouth daily.    Historical Provider, MD  HYDROcodone-acetaminophen (NORCO/VICODIN) 5-325 MG per tablet  06/14/14   Historical Provider, MD  lisinopril (PRINIVIL,ZESTRIL) 20 MG tablet TAKE 1 TABLET (20 MG TOTAL) BY MOUTH DAILY. 01/10/14   Myra Rude, MD  meloxicam [facility]) 15 MG tablet  06/26/15   Historical Provider, MD  multivitamin-iron-minerals-folic acid (CENTRUM) chewable tablet Chew 1 tablet by mouth daily.    Historical Provider, MD  traMADol Janean Sark) 50 MG tablet  06/12/15   Historical Provider, MD    Physical Exam: Filed Vitals:   06/27/15 2300 06/27/15 2315 06/27/15 2330 06/28/15 0019  BP: 148/81 152/76 153/81   Pulse: 66 65 68   Temp:    98.5 F (36.9 C)  TempSrc:      Resp: SpO2: 100% 99% 98%    General: Not in acute distress HEENT:       Eyes: PERRL, EOMI, no scleral icterus.       ENT: No discharge from the ears and nose, no pharynx injection, no tonsillar enlargement.        Neck: No JVD, no bruit, no mass felt. Heme: No neck lymph node enlargement. Cardiac: S1/S2, RRR, No murmurs, No gallops or rubs. Pulm: No rales, wheezing, rhonchi or rubs. Abd: Soft, nondistended, nontender, no rebound pain, no organomegaly, BS present. GU: No  hematuria Ext: No pitting leg edema bilaterally. 2+DP/PT pulse bilaterally. Musculoskeletal: No joint deformities, No joint redness or warmth, no limitation of ROM in spin. Skin: No rashes.  Neuro: Alert, oriented X3, cranial nerves II-XII grossly intact, moves all extremities normally. Muscle strength 4/5 in all extremities with poor effort, sensation to light touch intact. Knee reflex 1+ bilaterally. Negative Babinski's sign. Normal finger to nose test. Psych: Patient is not psychotic, no suicidal or hemocidal ideation.  Labs on Admission: I have personally reviewed following labs and imaging studies  CBC:  Recent Labs Lab 06/27/15 2122 06/27/15 2134  WBC 9.1  --   NEUTROABS 4.4  --   HGB 13.1 14.6  HCT 39.7 43.0  MCV 84.3  --   PLT 183  --    Basic Metabolic Panel:  Recent Labs Lab 06/27/15 2122 06/27/15 2134  NA 137 143  K  2.5* 2.6*  CL 107 107  CO2 14*  --   GLUCOSE 237* 245*  BUN 16 21*  CREATININE 1.26* 1.10*  CALCIUM 9.6  --    GFR: CrCl cannot be calculated (Unknown ideal weight.). Liver Function Tests:  Recent Labs Lab 06/27/15 2122  AST 32  ALT 19  ALKPHOS 78  BILITOT 0.7  PROT 8.0  ALBUMIN 3.7   No results for input(s): LIPASE, AMYLASE in the last 168 hours. No results for input(s): AMMONIA in the last 168 hours. Coagulation Profile:  Recent Labs Lab 06/27/15 2122  INR 1.17   Cardiac Enzymes: No results for input(s): CKTOTAL, CKMB, CKMBINDEX, TROPONINI in the last 168 hours. BNP (last 3 results) No results for input(s): PROBNP in the last 8760 hours. HbA1C: No results for input(s): HGBA1C in the last 72 hours. CBG:  Recent Labs Lab 06/27/15 2148  GLUCAP 265*   Lipid Profile: No results for input(s): CHOL, HDL, LDLCALC, TRIG, CHOLHDL, LDLDIRECT in the last 72 hours. Thyroid Function Tests: No results for input(s): TSH, T4TOTAL, FREET4, T3FREE, THYROIDAB in the last 72 hours. Anemia Panel: No results for input(s): VITAMINB12,  FOLATE, FERRITIN, TIBC, IRON, RETICCTPCT in the last 72 hours. Urine analysis:    Component Value Date/Time   COLORURINE YELLOW 06/28/2015 0006   APPEARANCEUR CLEAR 06/28/2015 0006   LABSPEC 1.019 06/28/2015 0006   PHURINE 6.0 06/28/2015 0006   GLUCOSEU 250* 06/28/2015 0006   HGBUR NEGATIVE 06/28/2015 0006   BILIRUBINUR NEGATIVE 06/28/2015 0006   KETONESUR 15* 06/28/2015 0006   PROTEINUR NEGATIVE 06/28/2015 0006   UROBILINOGEN 0.2 09/16/2011 2240   NITRITE NEGATIVE 06/28/2015 0006   LEUKOCYTESUR NEGATIVE 06/28/2015 0006   Sepsis Labs: @LABRCNTIP (procalcitonin:4,lacticidven:4) )No results found for this or any previous visit (from the past 240 hour(s)).   Radiological Exams on Admission: Ct Head Wo Contrast  06/27/2015  CLINICAL DATA:  Code stroke. Left-sided gaze, headache and weakness. Initial encounter. EXAM: CT HEAD WITHOUT CONTRAST TECHNIQUE: Contiguous axial images were obtained from the base of the skull through the vertex without intravenous contrast. COMPARISON:  MRI of the brain performed 02/22/2014 FINDINGS: There is no evidence of acute infarction, mass lesion, or intra- or extra-axial hemorrhage on CT. Prominence of the ventricles and sulci reflects mild cortical volume loss. Mild periventricular white matter change likely reflects small vessel ischemic microangiopathy. A small chronic lacunar infarct is noted at the right cerebellar hemisphere. The brainstem and fourth ventricle are within normal limits. The basal ganglia are unremarkable in appearance. The cerebral hemispheres demonstrate grossly normal gray-white differentiation. No mass effect or midline shift is seen. There is no evidence of fracture; visualized osseous structures are unremarkable in appearance. The orbits are within normal limits. The paranasal sinuses and mastoid air cells are well-aerated. No significant soft tissue abnormalities are seen. IMPRESSION: 1. No acute intracranial pathology seen on CT. 2. Mild  cortical volume loss and scattered small vessel ischemic microangiopathy. 3. Small chronic lacunar infarct at the right cerebellar hemisphere. These results were called by telephone at the time of interpretation on 06/27/2015 at 9:37 pm to Dr. Roseanne Reno, who verbally acknowledged these results. Electronically Signed   By: Roanna Raider M.D.   On: 06/27/2015 21:37     EKG: Independently reviewed. Sinus rhythm, LAD, U wave in V3-5, T-wave inversion in inferior leads and V4-V5  Assessment/Plan Principal Problem:   Dizziness Active Problems:   Major depressive disorder (HCC)   Hypertension   Chronic back pain   Hyperlipidemia   Diabetes mellitus,  type 2 (HCC)   Early onset Alzheimer's dementia without behavioral disturbance   Tobacco abuse   Anxiety   Hypokalemia   AKI (acute kidney injury) (HCC)   Protein-calorie malnutrition, moderate (HCC)   Nausea & vomiting   Dizziness and left gaze: Etiology is not clear. CT head is negative for acute intracranial abnormalities, but showed a small lacunar infarct. Neurology, Dr. Roseanne RenoStewart was consulted, recommended MRI of her brain and EEG.  -Will place pt on tele bed for obs -Appreciated Dr. Roseanne RenoStewart consultation, with follow-up recommendations-->will do stroke workup with risk assessment if MRI shows acute stroke -EEG -Aspirin 81 mg per day. -tele monitoring -check orthostatic vital signs -f/u UDS -Troponin 3 given the abnormal EKG  Addendum: MRI-brain showed small patchy acute right cerebellar infarcts.  -will do CT angiogram of head and CTA of neck with and without contrast. -2d echo -f/u A1c and FLP  Nausea, vomiting: Etiology is not clear. Likely due to viral gastritis. -IV fluid: 1 L normal saline, then 125 mL per hour -When necessary Zofran for nausea -Check lipase and LFT  Depression and anxiety:  no suicidal or homicidal ideations. -Continue home medications: Xanax, Remeron, Zoloft  Hypokalemia: K=2.5 on admission. -  Repleted - Give 1 g of maintaining softer by me - Check Mg level  HTN: -Hold lisinopril due to acute renal injury -Continue amlodipine -IV hydralazine when necessary  HLD: Last LDL was 56 on 07/02/13 -Continue home medications: Lipitor -Check FLP  DM-II: Last A1c 6.0 on 10/17/13, well controled. Patient is taking metformin at home -SSI -Check A1c  Early onset Alzheimer's dementia without behavioral disturbance: -Donepezil  Tobacco abuse: -did counseling about importance of quitting smoking -Nicotine patch  AKI: Likely due to prerenal secondary to dehydration and continuation of ACEI and NSAIDs - IVF as above - Follow up renal function by BMP - Hold lisinopril and Mobic  Protein-calorie malnutrition, moderate (HCC): -Consultation nutrition  DVT ppx: SQ Lovenox Code Status: Full code Family Communication: None at bed side.  Disposition Plan:  Anticipate discharge back to previous home environment Consults called:  Neurology, Dr. Roseanne RenoStewart Admission status: Obs / tele        Date of Service 06/28/2015    Lorretta HarpIU, Shannel Zahm [facility] Pager [phone]  If 7PM-7AM, please contact night-coverage [email or web address] Password [email or web address] 06/28/2015, 12:56 AM

## 2015-06-27 NOTE — ED Provider Notes (Signed)
CSN: [id number]     Arrival date & time 06/27/15  2117 History   First MD Initiated Contact with Patient 06/27/15 2132     Chief Complaint  Patient presents with  . Emesis   PT PRESENTS TO THE ED WITH A CODE STROKE.  THE PT WAS WALKING UP HER STAIRS AROUND 1950 TODAY WITH HER BOYFRIEND WHEN SHE BECAME ACUTELY DIAPHORETIC AND DIZZY.  THE PT SAID SHE FELT LIKE SHE COULD NOT BREATHE.  EMS SAID THAT SHE HAD "SLOW SPEECH" AND A LEFT GAZE.  PT VOMITED TWICE WITH EMS AND ONCE IN CT.  THE PT IS DIABETIC AND DID NOT EAT TODAY, BUT HER BLOOD SUGAR IS LOW.  (Consider location/radiation/quality/duration/timing/severity/associated sxs/prior Treatment) The history is provided by the patient.    Past Medical History  Diagnosis Date  . Hypertension   . Diabetes mellitus   . Pancreatitis 2010  . Idiopathic acute pancreatitis 09/16/2011  . Trigger finger   . Hypothyroidism   . Dementia   . Memory loss    Past Surgical History  Procedure Laterality Date  . Cholecystectomy  2003  . Spine surgery  2006    Dr. Trey Sailors - L3-L4, L4-L5 laminotomy and foraminotomy  . Back surgery    . Cyst removal hand Left 12/11/2014    Procedure: LEFT RING FINGER TENDON SHEATH CYST EXCISION;  Surgeon: Mack Hook, MD;  Location: [facility];  Service: Orthopedics;  Laterality: Left;   Family History  Problem Relation Age of Onset  . Cancer Mother   . Alcohol abuse Father    Social History  Substance Use Topics  . Smoking status: Current Every Day Smoker -- 0.50 packs/day    Types: Cigarettes  . Smokeless tobacco: Never Used     Comment: still in stress. looking for job.   . Alcohol Use: No   OB History    No data available     Review of Systems  Neurological: Positive for dizziness and headaches.  All other systems reviewed and are negative.     Allergies  Review of patient's allergies indicates no known allergies.  Home Medications   Prior to Admission medications   Medication  Sig Start Date End Date Taking? Authorizing Provider  amLODipine (NORVASC) 5 MG tablet Take 5 mg by mouth daily.   Yes Historical Provider, MD  atorvastatin (LIPITOR) 40 MG tablet Take 40 mg by mouth daily. 06/13/14  Yes Historical Provider, MD  donepezil (ARICEPT) 10 MG tablet Take 1 tablet (10 mg total) by mouth at bedtime. 05/04/15  Yes Adam Mliss Fritz, DO  levothyroxine (SYNTHROID, LEVOTHROID) 50 MCG tablet Take 1 tablet (50 mcg total) by mouth daily. 11/24/14  Yes Uvaldo Rising, MD  metFORMIN (GLUCOPHAGE) 500 MG tablet TAKE ONE TABLET BY MOUTH ONCE DAILY 04/03/14  Yes Myra Rude, MD  mirtazapine (REMERON) 15 MG tablet Take 15 mg by mouth daily as needed (depressive disorder).   Yes Historical Provider, MD  sertraline (ZOLOFT) 100 MG tablet Take 1 tablet (100 mg total) by mouth 2 (two) times daily. 11/21/13  Yes Myra Rude, MD  acetaminophen (TYLENOL) 325 MG tablet Take 325 mg by mouth every 6 (six) hours as needed.    Historical Provider, MD  ALPRAZolam Prudy Feeler) 0.5 MG tablet Take 0.5 mg by mouth daily.     Historical Provider, MD  aspirin EC 81 MG tablet Take 81 mg by mouth daily.      Historical Provider, MD  CALCIUM PO Take 1  tablet by mouth daily.    Historical Provider, MD  HYDROcodone-acetaminophen (NORCO/VICODIN) 5-325 MG per tablet  06/14/14   Historical Provider, MD  lisinopril (PRINIVIL,ZESTRIL) 20 MG tablet TAKE 1 TABLET (20 MG TOTAL) BY MOUTH DAILY. 01/10/14   Myra RudeJeremy E Schmitz, MD  meloxicam [facility]) 15 MG tablet  06/26/15   Historical Provider, MD  multivitamin-iron-minerals-folic acid (CENTRUM) chewable tablet Chew 1 tablet by mouth daily.    Historical Provider, MD  traMADol (ULTRAM) 50 MG tablet  06/12/15   Historical Provider, MD   BP 158/85 mmHg  Pulse 79  Temp(Src) 97.1 F (36.2 C) (Axillary)  Resp 25  SpO2 100% Physical Exam  Constitutional: She is oriented to person, place, and time. She appears distressed.  HENT:  Head: Normocephalic and atraumatic.  Right Ear:  External ear normal.  Left Ear: External ear normal.  Nose: Nose normal.  Mouth/Throat: Oropharynx is clear and moist.  Eyes: Conjunctivae and EOM are normal. Pupils are equal, round, and reactive to light.  Neck: Normal range of motion. Neck supple.  Cardiovascular: Normal rate, regular rhythm, normal heart sounds and intact distal pulses.   Pulmonary/Chest: Effort normal and breath sounds normal.  Abdominal: Soft. Bowel sounds are normal.  Musculoskeletal: Normal range of motion.  Neurological: She is alert and oriented to person, place, and time.  Skin: Skin is warm. She is diaphoretic.  Psychiatric: She has a normal mood and affect. Her behavior is normal. Judgment and thought content normal.  Nursing note and vitals reviewed.   ED Course  Procedures (including critical care time) Labs Review Labs Reviewed  CBC - Abnormal; Notable for the following:    RDW 16.1 (*)    All other components within normal limits  COMPREHENSIVE METABOLIC PANEL - Abnormal; Notable for the following:    Potassium 2.5 (*)    CO2 14 (*)    Glucose, Bld 237 (*)    Creatinine, Ser 1.26 (*)    GFR calc non Af Amer 44 (*)    GFR calc Af Amer 51 (*)    Anion gap 16 (*)    All other components within normal limits  I-STAT CHEM 8, ED - Abnormal; Notable for the following:    Potassium 2.6 (*)    BUN 21 (*)    Creatinine, Ser 1.10 (*)    Glucose, Bld 245 (*)    Calcium, Ion 1.10 (*)    All other components within normal limits  CBG MONITORING, ED - Abnormal; Notable for the following:    Glucose-Capillary 265 (*)    All other components within normal limits  ETHANOL  PROTIME-INR  APTT  DIFFERENTIAL  D-DIMER, QUANTITATIVE (NOT AT [facility])  URINE RAPID DRUG SCREEN, HOSP PERFORMED  URINALYSIS, ROUTINE W REFLEX MICROSCOPIC (NOT AT [facility])  I-STAT TROPOININ, ED    Imaging Review Ct Head Wo Contrast  06/27/2015  CLINICAL DATA:  Code stroke. Left-sided gaze, headache and weakness. Initial encounter.  EXAM: CT HEAD WITHOUT CONTRAST TECHNIQUE: Contiguous axial images were obtained from the base of the skull through the vertex without intravenous contrast. COMPARISON:  MRI of the brain performed 02/22/2014 FINDINGS: There is no evidence of acute infarction, mass lesion, or intra- or extra-axial hemorrhage on CT. Prominence of the ventricles and sulci reflects mild cortical volume loss. Mild periventricular white matter change likely reflects small vessel ischemic microangiopathy. A small chronic lacunar infarct is noted at the right cerebellar hemisphere. The brainstem and fourth ventricle are within normal limits. The basal ganglia are unremarkable  in appearance. The cerebral hemispheres demonstrate grossly normal gray-white differentiation. No mass effect or midline shift is seen. There is no evidence of fracture; visualized osseous structures are unremarkable in appearance. The orbits are within normal limits. The paranasal sinuses and mastoid air cells are well-aerated. No significant soft tissue abnormalities are seen. IMPRESSION: 1. No acute intracranial pathology seen on CT. 2. Mild cortical volume loss and scattered small vessel ischemic microangiopathy. 3. Small chronic lacunar infarct at the right cerebellar hemisphere. These results were called by telephone at the time of interpretation on 06/27/2015 at 9:37 pm to Dr. Roseanne Reno, who verbally acknowledged these results. Electronically Signed   By: Roanna Raider M.D.   On: 06/27/2015 21:37   I have personally reviewed and evaluated these images and lab results as part of my medical decision-making.   EKG Interpretation   Date/Time:  Wednesday Jun 27 2015 21:46:07 EDT Ventricular Rate:  71 PR Interval:  135 QRS Duration: 94 QT Interval:  338 QTC Calculation: 367 R Axis:   -32 Text Interpretation:  Sinus rhythm Left atrial enlargement Left  ventricular hypertrophy Anterior Q waves, possibly due to LVH NEW T WAVE  INVERSIONS INFERIORLY.  Confirmed by [facility] MD, Shamar Kracke [phone]) on  06/27/2015 9:50:39 PM      MDM  Pt seen and d/w Dr. Roseanne Reno (neuro).  He does not think this is a stroke.  I think pt has vertigo.  She has improved, but she is still extremely dizzy.  Whenever she lays flat, she feels like she is going to vomit.  She said that she still sees 4 of me.  I will speak to the hospitalist for admission.  Pt. D/w Dr. Clyde Lundborg who will admit pt to the hospital. Final diagnoses:  Vertigo  Non-intractable vomiting with nausea, vomiting of unspecified type  Hypokalemia       Jacalyn Lefevre, MD 06/27/15 2355

## 2015-06-27 NOTE — Progress Notes (Signed)
Code stroke called  for 65 y.o female, per EMS presenting with left gaze blurred vision, nausea, vomiting, and slurred speech after walking up stairs. LSN 1950. CBG 265. Pertinent history includes HTN, DM, Dementia. CT scan completed STAT reviewed per Neurologist Dr. Roseanne RenoStewart as negative for acute abnormalities. NIHSS completed yielding 2 for drowsiness and inability to state correct age. Speech slowed but not dysarthric. Patient not a candidate for TPA at this time, no focal deficits. Code Stroke canceled per Neurologist at 2147.

## 2015-06-27 NOTE — Consult Note (Signed)
Admission H&P    Chief Complaint: Leftward gaze, slowed speech, diaphoresis and nausea.  HPI: [name] is an 65 y.o. female history of hypertension, diabetes mellitus, pancreatitis, hypothyroidism and dementia, brought to the emergency room by EMS and code stroke status following acute onset of feeling of weakness and numbness of breath as well as diaphoresis and slowed speech. Patient was also had nausea and vomiting. She reportedly had leftward gaze. There was no loss of consciousness. It's unclear whether she was actually confused. Blood sugar was 245. CT scan of her head showed no acute intracranial abnormality. Potassium was low at 2.6. BUN and creatinine was slightly elevated. Urinalysis is pending. Patient had no focal deficits on initial evaluation in the ED, including no eye movement abnormality. Speech was somewhat slowed but not dysarthric. NIH stroke score was 1 for not knowing her current age. Patient was noted to be diaphoretic and clammy. Code stroke was canceled.  Past Medical History  Diagnosis Date  . Hypertension   . Diabetes mellitus   . Pancreatitis 2010  . Idiopathic acute pancreatitis 09/16/2011  . Trigger finger   . Hypothyroidism   . Dementia   . Memory loss     Past Surgical History  Procedure Laterality Date  . Cholecystectomy  2003  . Spine surgery  2006    Dr. Trey SailorsMark Roy - L3-L4, L4-L5 laminotomy and foraminotomy  . Back surgery    . Cyst removal hand Left 12/11/2014    Procedure: LEFT RING FINGER TENDON SHEATH CYST EXCISION;  Surgeon: Mack Hookavid Thompson, MD;  Location: [facility];  Service: Orthopedics;  Laterality: Left;    Family History  Problem Relation Age of Onset  . Cancer Mother   . Alcohol abuse Father    Social History:  reports that she has been smoking Cigarettes.  She has been smoking about 0.50 packs per day. She has never used smokeless tobacco. She reports that she does not drink alcohol or use illicit drugs.  Allergies:  No Known Allergies  Medications: Preadmission medications were reviewed by me.  ROS: No reliable review of systems was available, as patient has known dementia.  Physical Examination: Blood pressure 158/85, pulse 79, temperature 97.1 F (36.2 C), temperature source Axillary, resp. rate 25, SpO2 100 %.  HEENT-  Normocephalic, no lesions, without obvious abnormality.  Normal external eye and conjunctiva.  Normal TM's bilaterally.  Normal auditory canals and external ears. Normal external nose, mucus membranes and septum.  Normal pharynx. Neck supple with no masses, nodes, nodules or enlargement. Cardiovascular - regular rate and rhythm, S1, S2 normal, no murmur, click, rub or gallop Lungs - chest clear, no wheezing, rales, normal symmetric air entry Abdomen - soft, non-tender; bowel sounds normal; no masses,  no organomegaly Extremities - no joint deformities, effusion, or inflammation and no edema  Neurologic Examination: Mental Status: Lethargic, could not remember current age, complaining of shortness of breath and right-sided headache.  Speech fluent without evidence of aphasia. Able to follow commands without difficulty. Cranial Nerves: II-Visual fields were normal. III/IV/VI-Pupils were equal and reacted normally to light. Extraocular movements were full and conjugate.    V/VII-no facial numbness and no facial weakness. VIII-normal. X-no dysarthria. XI: trapezius strength/neck flexion strength normal bilaterally XII-midline tongue extension with normal strength. Motor: 5/5 bilaterally with normal tone and bulk Sensory: Normal throughout. Deep Tendon Reflexes: 2+ and symmetric. Plantars: Flexor bilaterally  Results for orders placed or performed during the hospital encounter of 06/27/15 (from the past 48  hour(s))  Protime-INR     Status: None   Collection Time: 06/27/15  9:22 PM  Result Value Ref Range   Prothrombin Time 15.1 11.6 - 15.2 seconds   INR 1.17 0.00 - 1.49   APTT     Status: None   Collection Time: 06/27/15  9:22 PM  Result Value Ref Range   aPTT 27 24 - 37 seconds  CBC     Status: Abnormal   Collection Time: 06/27/15  9:22 PM  Result Value Ref Range   WBC 9.1 4.0 - 10.5 K/uL   RBC 4.71 3.87 - 5.11 MIL/uL   Hemoglobin 13.1 12.0 - 15.0 g/dL   HCT 14.7 82.9 - 56.2 %   MCV 84.3 78.0 - 100.0 fL   MCH 27.8 26.0 - 34.0 pg   MCHC 33.0 30.0 - 36.0 g/dL   RDW 13.0 (H) 86.5 - 78.4 %   Platelets 183 150 - 400 K/uL  Differential     Status: None   Collection Time: 06/27/15  9:22 PM  Result Value Ref Range   Neutrophils Relative % 48 %   Neutro Abs 4.4 1.7 - 7.7 K/uL   Lymphocytes Relative 42 %   Lymphs Abs 3.8 0.7 - 4.0 K/uL   Monocytes Relative 9 %   Monocytes Absolute 0.8 0.1 - 1.0 K/uL   Eosinophils Relative 1 %   Eosinophils Absolute 0.1 0.0 - 0.7 K/uL   Basophils Relative 0 %   Basophils Absolute 0.0 0.0 - 0.1 K/uL  D-dimer, quantitative (not at [facility])     Status: None   Collection Time: 06/27/15  9:22 PM  Result Value Ref Range   D-Dimer, Quant 0.35 0.00 - 0.50 ug/mL-FEU    Comment: (NOTE) At the manufacturer cut-off of 0.50 ug/mL FEU, this assay has been documented to exclude PE with a sensitivity and negative predictive value of 97 to 99%.  At this time, this assay has not been approved by the FDA to exclude DVT/VTE. Results should be correlated with clinical presentation.   I-stat troponin, ED (not at [facility], [facility])     Status: None   Collection Time: 06/27/15  9:33 PM  Result Value Ref Range   Troponin i, poc 0.01 0.00 - 0.08 ng/mL   Comment 3            Comment: Due to the release kinetics of cTnI, a negative result within the first hours of the onset of symptoms does not rule out myocardial infarction with certainty. If myocardial infarction is still suspected, repeat the test at appropriate intervals.   I-Stat Chem 8, ED  (not at [facility], [facility])     Status: Abnormal   Collection Time: 06/27/15  9:34 PM  Result Value Ref  Range   Sodium 143 135 - 145 mmol/L   Potassium 2.6 (LL) 3.5 - 5.1 mmol/L   Chloride 107 101 - 111 mmol/L   BUN 21 (H) 6 - 20 mg/dL   Creatinine, Ser 6.96 (H) 0.44 - 1.00 mg/dL   Glucose, Bld 295 (H) 65 - 99 mg/dL   Calcium, Ion 2.84 (L) 1.13 - 1.30 mmol/L   TCO2 15 0 - 100 mmol/L   Hemoglobin 14.6 12.0 - 15.0 g/dL   HCT 13.2 44.0 - 10.2 %  CBG monitoring, ED     Status: Abnormal   Collection Time: 06/27/15  9:48 PM  Result Value Ref Range   Glucose-Capillary 265 (H) 65 - 99 mg/dL   Ct Head Wo Contrast  06/27/2015  CLINICAL DATA:  Code stroke. Left-sided gaze, headache and weakness. Initial encounter. EXAM: CT HEAD WITHOUT CONTRAST TECHNIQUE: Contiguous axial images were obtained from the base of the skull through the vertex without intravenous contrast. COMPARISON:  MRI of the brain performed 02/22/2014 FINDINGS: There is no evidence of acute infarction, mass lesion, or intra- or extra-axial hemorrhage on CT. Prominence of the ventricles and sulci reflects mild cortical volume loss. Mild periventricular white matter change likely reflects small vessel ischemic microangiopathy. A small chronic lacunar infarct is noted at the right cerebellar hemisphere. The brainstem and fourth ventricle are within normal limits. The basal ganglia are unremarkable in appearance. The cerebral hemispheres demonstrate grossly normal gray-white differentiation. No mass effect or midline shift is seen. There is no evidence of fracture; visualized osseous structures are unremarkable in appearance. The orbits are within normal limits. The paranasal sinuses and mastoid air cells are well-aerated. No significant soft tissue abnormalities are seen. IMPRESSION: 1. No acute intracranial pathology seen on CT. 2. Mild cortical volume loss and scattered small vessel ischemic microangiopathy. 3. Small chronic lacunar infarct at the right cerebellar hemisphere. These results were called by telephone at the time of interpretation on  06/27/2015 at 9:37 pm to Dr. Roseanne Reno, who verbally acknowledged these results. Electronically Signed   By: Roanna Raider M.D.   On: 06/27/2015 21:37    Assessment/Plan 65 year old lady presenting with diaphoresis nausea and vomiting, shortness of breath and right-sided headache. She has mild hyperglycemia as well as moderate hypokalemia, which may be contributing to her symptoms. She has no clinical signs of an acute stroke or TIA.  Recommendations: 1. MRI of the brain without contrast 2. Stroke workup with risk assessment if MRI shows acute stroke 3. Aspirin 81 mg per day. 4. EEG, routine adult study. Likelihood of presenting symptoms being manifestations of a partial seizure is low, however. 5. Telemetry monitoring  We will continue to follow this patient with you.  C.R. Roseanne Reno, MD [facility] [phone]  06/27/2015, 10:24 PM

## 2015-06-27 NOTE — ED Notes (Signed)
Dr, Particia NearingHaviland made aware of potassium

## 2015-06-27 NOTE — ED Notes (Signed)
MD at bedside. 

## 2015-06-28 ENCOUNTER — Encounter (HOSPITAL_COMMUNITY): Payer: Self-pay

## 2015-06-28 ENCOUNTER — Observation Stay (HOSPITAL_COMMUNITY): Payer: Medicare Other

## 2015-06-28 DIAGNOSIS — M549 Dorsalgia, unspecified: Secondary | ICD-10-CM | POA: Diagnosis present

## 2015-06-28 DIAGNOSIS — G300 Alzheimer's disease with early onset: Secondary | ICD-10-CM | POA: Diagnosis present

## 2015-06-28 DIAGNOSIS — F331 Major depressive disorder, recurrent, moderate: Secondary | ICD-10-CM | POA: Diagnosis present

## 2015-06-28 DIAGNOSIS — G8929 Other chronic pain: Secondary | ICD-10-CM | POA: Diagnosis present

## 2015-06-28 DIAGNOSIS — F419 Anxiety disorder, unspecified: Secondary | ICD-10-CM | POA: Diagnosis present

## 2015-06-28 DIAGNOSIS — N179 Acute kidney failure, unspecified: Secondary | ICD-10-CM | POA: Diagnosis not present

## 2015-06-28 DIAGNOSIS — R42 Dizziness and giddiness: Secondary | ICD-10-CM

## 2015-06-28 DIAGNOSIS — I639 Cerebral infarction, unspecified: Secondary | ICD-10-CM | POA: Insufficient documentation

## 2015-06-28 DIAGNOSIS — R531 Weakness: Secondary | ICD-10-CM | POA: Diagnosis present

## 2015-06-28 DIAGNOSIS — E785 Hyperlipidemia, unspecified: Secondary | ICD-10-CM

## 2015-06-28 DIAGNOSIS — I6789 Other cerebrovascular disease: Secondary | ICD-10-CM | POA: Diagnosis not present

## 2015-06-28 DIAGNOSIS — F339 Major depressive disorder, recurrent, unspecified: Secondary | ICD-10-CM

## 2015-06-28 DIAGNOSIS — Z79899 Other long term (current) drug therapy: Secondary | ICD-10-CM | POA: Diagnosis not present

## 2015-06-28 DIAGNOSIS — E119 Type 2 diabetes mellitus without complications: Secondary | ICD-10-CM

## 2015-06-28 DIAGNOSIS — Z720 Tobacco use: Secondary | ICD-10-CM

## 2015-06-28 DIAGNOSIS — E039 Hypothyroidism, unspecified: Secondary | ICD-10-CM | POA: Diagnosis present

## 2015-06-28 DIAGNOSIS — R29701 NIHSS score 1: Secondary | ICD-10-CM | POA: Diagnosis present

## 2015-06-28 DIAGNOSIS — Z6833 Body mass index (BMI) 33.0-33.9, adult: Secondary | ICD-10-CM | POA: Diagnosis not present

## 2015-06-28 DIAGNOSIS — E1159 Type 2 diabetes mellitus with other circulatory complications: Secondary | ICD-10-CM | POA: Diagnosis not present

## 2015-06-28 DIAGNOSIS — Z7982 Long term (current) use of aspirin: Secondary | ICD-10-CM | POA: Diagnosis not present

## 2015-06-28 DIAGNOSIS — I6340 Cerebral infarction due to embolism of unspecified cerebral artery: Secondary | ICD-10-CM | POA: Diagnosis present

## 2015-06-28 DIAGNOSIS — F17210 Nicotine dependence, cigarettes, uncomplicated: Secondary | ICD-10-CM | POA: Diagnosis present

## 2015-06-28 DIAGNOSIS — E113299 Type 2 diabetes mellitus with mild nonproliferative diabetic retinopathy without macular edema, unspecified eye: Secondary | ICD-10-CM | POA: Diagnosis present

## 2015-06-28 DIAGNOSIS — E876 Hypokalemia: Secondary | ICD-10-CM | POA: Diagnosis present

## 2015-06-28 DIAGNOSIS — I10 Essential (primary) hypertension: Secondary | ICD-10-CM

## 2015-06-28 DIAGNOSIS — R1110 Vomiting, unspecified: Secondary | ICD-10-CM | POA: Insufficient documentation

## 2015-06-28 DIAGNOSIS — R278 Other lack of coordination: Secondary | ICD-10-CM | POA: Diagnosis present

## 2015-06-28 DIAGNOSIS — F0280 Dementia in other diseases classified elsewhere without behavioral disturbance: Secondary | ICD-10-CM | POA: Diagnosis present

## 2015-06-28 DIAGNOSIS — Z8673 Personal history of transient ischemic attack (TIA), and cerebral infarction without residual deficits: Secondary | ICD-10-CM | POA: Diagnosis present

## 2015-06-28 DIAGNOSIS — R112 Nausea with vomiting, unspecified: Secondary | ICD-10-CM | POA: Diagnosis present

## 2015-06-28 DIAGNOSIS — Z7984 Long term (current) use of oral hypoglycemic drugs: Secondary | ICD-10-CM | POA: Diagnosis not present

## 2015-06-28 DIAGNOSIS — E440 Moderate protein-calorie malnutrition: Secondary | ICD-10-CM | POA: Diagnosis present

## 2015-06-28 DIAGNOSIS — A084 Viral intestinal infection, unspecified: Secondary | ICD-10-CM | POA: Diagnosis present

## 2015-06-28 DIAGNOSIS — E1165 Type 2 diabetes mellitus with hyperglycemia: Secondary | ICD-10-CM | POA: Diagnosis present

## 2015-06-28 LAB — LIPID PANEL
CHOL/HDL RATIO: 2.8 ratio
Cholesterol: 109 mg/dL (ref 0–200)
HDL: 39 mg/dL — ABNORMAL LOW (ref >40)
LDL CALC: 56 mg/dL (ref 0–99)
Triglycerides: 69 mg/dL (ref <150)
VLDL: 14 mg/dL (ref 0–40)

## 2015-06-28 LAB — URINALYSIS, ROUTINE W REFLEX MICROSCOPIC
Bilirubin Urine: NEGATIVE
Glucose, UA: 250 mg/dL — AB
HGB URINE DIPSTICK: NEGATIVE
Ketones, ur: 15 mg/dL — AB
LEUKOCYTES UA: NEGATIVE
NITRITE: NEGATIVE
PROTEIN: NEGATIVE mg/dL
SPECIFIC GRAVITY, URINE: 1.019 (ref 1.005–1.030)
pH: 6 (ref 5.0–8.0)

## 2015-06-28 LAB — VITAMIN B12: VITAMIN B 12: 1325 pg/mL — AB (ref 180–914)

## 2015-06-28 LAB — LIPASE, BLOOD: LIPASE: 41 U/L (ref 11–51)

## 2015-06-28 LAB — GLUCOSE, CAPILLARY
Glucose-Capillary: 147 mg/dL — ABNORMAL HIGH (ref 65–99)
Glucose-Capillary: 114 mg/dL — ABNORMAL HIGH (ref 65–99)
Glucose-Capillary: 96 mg/dL (ref 65–99)
Glucose-Capillary: 101 mg/dL — ABNORMAL HIGH (ref 65–99)
Glucose-Capillary: 121 mg/dL — ABNORMAL HIGH (ref 65–99)

## 2015-06-28 LAB — HEPATIC FUNCTION PANEL
ALBUMIN: 3.5 g/dL (ref 3.5–5.0)
ALT: 21 U/L (ref 14–54)
AST: 30 U/L (ref 15–41)
Alkaline Phosphatase: 77 U/L (ref 38–126)
BILIRUBIN DIRECT: 0.1 mg/dL (ref 0.1–0.5)
Indirect Bilirubin: 0.3 mg/dL (ref 0.3–0.9)
Total Bilirubin: 0.4 mg/dL (ref 0.3–1.2)
Total Protein: 7.4 g/dL (ref 6.5–8.1)

## 2015-06-28 LAB — TROPONIN I: Troponin I: <0.03 ng/mL (ref <0.031)

## 2015-06-28 LAB — RAPID URINE DRUG SCREEN, HOSP PERFORMED
Amphetamines: NOT DETECTED
BENZODIAZEPINES: NOT DETECTED
Barbiturates: NOT DETECTED
Cocaine: NOT DETECTED
Opiates: NOT DETECTED
Tetrahydrocannabinol: NOT DETECTED

## 2015-06-28 LAB — TSH: TSH: 2.751 u[IU]/mL (ref 0.350–4.500)

## 2015-06-28 LAB — MAGNESIUM: MAGNESIUM: 2.1 mg/dL (ref 1.7–2.4)

## 2015-06-28 LAB — POTASSIUM: Potassium: 4.5 mmol/L (ref 3.5–5.1)

## 2015-06-28 MED ORDER — CLOPIDOGREL BISULFATE 75 MG PO TABS
75.0000 mg | ORAL_TABLET | Freq: Every day | ORAL | Status: DC
  Administered 2015-06-28 – 2015-07-03 (×6): 75 mg via ORAL
  Filled 2015-06-28 (×6): qty 1

## 2015-06-28 MED ORDER — ACETAMINOPHEN 325 MG PO TABS: 325.0000 mg | ORAL_TABLET | Freq: Four times a day (QID) | ORAL | Status: DC | PRN

## 2015-06-28 MED ORDER — ATORVASTATIN CALCIUM 40 MG PO TABS
40.0000 mg | ORAL_TABLET | Freq: Every day | ORAL | Status: DC
  Administered 2015-06-28 – 2015-07-03 (×6): 40 mg via ORAL
  Filled 2015-06-28 (×6): qty 1

## 2015-06-28 MED ORDER — IOPAMIDOL (ISOVUE-370) INJECTION 76%
INTRAVENOUS | Status: AC
  Filled 2015-06-28: qty 50

## 2015-06-28 MED ORDER — POTASSIUM CHLORIDE 10 MEQ/100ML IV SOLN
10.0000 meq | INTRAVENOUS | Status: AC
  Administered 2015-06-28 (×2): 10 meq via INTRAVENOUS
  Filled 2015-06-28: qty 100

## 2015-06-28 MED ORDER — HYDRALAZINE HCL 20 MG/ML IJ SOLN: 5.0000 mg | INTRAMUSCULAR | Status: DC | PRN

## 2015-06-28 MED ORDER — DONEPEZIL HCL 10 MG PO TABS
10.0000 mg | ORAL_TABLET | Freq: Every day | ORAL | Status: DC
  Administered 2015-06-28 – 2015-07-02 (×5): 10 mg via ORAL
  Filled 2015-06-28 (×5): qty 2

## 2015-06-28 MED ORDER — ONDANSETRON HCL 4 MG/2ML IJ SOLN: 4.0000 mg | Freq: Three times a day (TID) | INTRAMUSCULAR | Status: DC | PRN

## 2015-06-28 MED ORDER — POTASSIUM CHLORIDE 20 MEQ/15ML (10%) PO SOLN
40.0000 meq | Freq: Once | ORAL | Status: AC
  Administered 2015-06-28: 40 meq via ORAL
  Filled 2015-06-28: qty 30

## 2015-06-28 MED ORDER — LEVOTHYROXINE SODIUM 50 MCG PO TABS
50.0000 ug | ORAL_TABLET | Freq: Every day | ORAL | Status: DC
  Administered 2015-06-28 – 2015-07-03 (×6): 50 ug via ORAL
  Filled 2015-06-28 (×6): qty 1

## 2015-06-28 MED ORDER — GLUCERNA SHAKE PO LIQD
237.0000 mL | Freq: Three times a day (TID) | ORAL | Status: DC
  Administered 2015-06-28 – 2015-07-03 (×12): 237 mL via ORAL
  Filled 2015-06-28 (×19): qty 237

## 2015-06-28 MED ORDER — SENNOSIDES-DOCUSATE SODIUM 8.6-50 MG PO TABS
1.0000 | ORAL_TABLET | Freq: Every evening | ORAL | Status: DC | PRN
  Filled 2015-06-28: qty 1

## 2015-06-28 MED ORDER — ENOXAPARIN SODIUM 40 MG/0.4ML ~~LOC~~ SOLN
40.0000 mg | Freq: Every day | SUBCUTANEOUS | Status: DC
  Administered 2015-06-28 – 2015-07-03 (×6): 40 mg via SUBCUTANEOUS
  Filled 2015-06-28 (×6): qty 0.4

## 2015-06-28 MED ORDER — AMLODIPINE BESYLATE 5 MG PO TABS
5.0000 mg | ORAL_TABLET | Freq: Every day | ORAL | Status: DC
  Administered 2015-06-28 – 2015-07-03 (×6): 5 mg via ORAL
  Filled 2015-06-28 (×6): qty 1

## 2015-06-28 MED ORDER — INSULIN ASPART 100 UNIT/ML ~~LOC~~ SOLN
0.0000 [IU] | Freq: Three times a day (TID) | SUBCUTANEOUS | Status: DC
  Administered 2015-06-29 – 2015-07-03 (×6): 1 [IU] via SUBCUTANEOUS

## 2015-06-28 MED ORDER — MECLIZINE HCL 12.5 MG PO TABS
12.5000 mg | ORAL_TABLET | Freq: Three times a day (TID) | ORAL | Status: DC | PRN
  Administered 2015-06-28: 12.5 mg via ORAL
  Filled 2015-06-28: qty 1

## 2015-06-28 MED ORDER — STROKE: EARLY STAGES OF RECOVERY BOOK
Freq: Once | Status: DC
  Filled 2015-06-28: qty 1

## 2015-06-28 MED ORDER — SERTRALINE HCL 100 MG PO TABS
100.0000 mg | ORAL_TABLET | Freq: Two times a day (BID) | ORAL | Status: DC
  Administered 2015-06-28 – 2015-07-03 (×11): 100 mg via ORAL
  Filled 2015-06-28 (×11): qty 2

## 2015-06-28 MED ORDER — POTASSIUM CHLORIDE CRYS ER 20 MEQ PO TBCR
40.0000 meq | EXTENDED_RELEASE_TABLET | ORAL | Status: AC
  Administered 2015-06-28 (×2): 40 meq via ORAL
  Filled 2015-06-28 (×2): qty 2

## 2015-06-28 MED ORDER — POTASSIUM CHLORIDE 10 MEQ/100ML IV SOLN
10.0000 meq | INTRAVENOUS | Status: AC
  Administered 2015-06-28: 10 meq via INTRAVENOUS
  Filled 2015-06-28 (×2): qty 100

## 2015-06-28 MED ORDER — MAGNESIUM SULFATE IN D5W 1-5 GM/100ML-% IV SOLN
1.0000 g | Freq: Once | INTRAVENOUS | Status: AC
  Administered 2015-06-28: 1 g via INTRAVENOUS
  Filled 2015-06-28: qty 100

## 2015-06-28 MED ORDER — NICOTINE 21 MG/24HR TD PT24
21.0000 mg | MEDICATED_PATCH | Freq: Every day | TRANSDERMAL | Status: DC
  Administered 2015-06-28 – 2015-07-03 (×6): 21 mg via TRANSDERMAL
  Filled 2015-06-28 (×6): qty 1

## 2015-06-28 MED ORDER — ADULT MULTIVITAMIN W/MINERALS CH
1.0000 | ORAL_TABLET | Freq: Every day | ORAL | Status: DC
  Administered 2015-06-28 – 2015-07-03 (×6): 1 via ORAL
  Filled 2015-06-28 (×6): qty 1

## 2015-06-28 MED ORDER — POTASSIUM CHLORIDE 10 MEQ/100ML IV SOLN
10.0000 meq | INTRAVENOUS | Status: AC
  Administered 2015-06-28: 10 meq via INTRAVENOUS
  Filled 2015-06-28: qty 100

## 2015-06-28 MED ORDER — ASPIRIN EC 81 MG PO TBEC
81.0000 mg | DELAYED_RELEASE_TABLET | Freq: Every day | ORAL | Status: DC
  Administered 2015-06-28 – 2015-07-03 (×6): 81 mg via ORAL
  Filled 2015-06-28 (×5): qty 1

## 2015-06-28 MED ORDER — MIRTAZAPINE 15 MG PO TABS
15.0000 mg | ORAL_TABLET | Freq: Every day | ORAL | Status: DC | PRN
  Administered 2015-07-01: 15 mg via ORAL
  Filled 2015-06-28: qty 1

## 2015-06-28 MED ORDER — SODIUM CHLORIDE 0.9 % IV SOLN
INTRAVENOUS | Status: DC
  Administered 2015-06-29: 06:00:00 via INTRAVENOUS

## 2015-06-28 MED ORDER — HYDROCODONE-ACETAMINOPHEN 5-325 MG PO TABS: 1.0000 | ORAL_TABLET | ORAL | Status: DC | PRN

## 2015-06-28 MED ORDER — INSULIN ASPART 100 UNIT/ML ~~LOC~~ SOLN: 0.0000 [IU] | Freq: Every day | SUBCUTANEOUS | Status: DC

## 2015-06-28 MED ORDER — CALCIUM CARBONATE 1250 (500 CA) MG PO TABS
1250.0000 mg | ORAL_TABLET | Freq: Every day | ORAL | Status: DC
  Administered 2015-06-28 – 2015-07-03 (×6): 1250 mg via ORAL
  Filled 2015-06-28 (×6): qty 1

## 2015-06-28 MED ORDER — ALPRAZOLAM 0.5 MG PO TABS
0.5000 mg | ORAL_TABLET | Freq: Every day | ORAL | Status: DC
  Administered 2015-06-29 – 2015-07-02 (×4): 0.5 mg via ORAL
  Filled 2015-06-28 (×6): qty 2

## 2015-06-28 NOTE — Progress Notes (Signed)
STROKE TEAM PROGRESS NOTE   HISTORY OF PRESENT ILLNESS (per record) [name] is an 65 y.o. female history of hypertension, diabetes mellitus, pancreatitis, hypothyroidism and dementia, brought to the emergency room by EMS and code stroke status following acute onset of feeling of weakness and numbness of breath as well as diaphoresis and slowed speech. Patient was also had nausea and vomiting. She reportedly had leftward gaze. There was no loss of consciousness. It's unclear whether she was actually confused. Blood sugar was 245. CT scan of her head showed no acute intracranial abnormality. Potassium was low at 2.6. BUN and creatinine was slightly elevated. Urinalysis is pending. Patient had no focal deficits on initial evaluation in the ED, including no eye movement abnormality. Speech was somewhat slowed but not dysarthric. NIH stroke score was 1 for not knowing her current age. Patient was noted to be diaphoretic and clammy. Code stroke was canceled. Patient was not administered IV t-PA. She was admitted for further evaluation and treatment.   SUBJECTIVE (INTERVAL HISTORY) Patient states her dizziness is improved but not gone. Nausea vomiting has improved. She denies any prior history of strokes or TIAs.   OBJECTIVE Temp:  [97.1 F (36.2 C)-98.5 F (36.9 C)] 98.5 F (36.9 C) (06/01 0019) Pulse Rate:  [64-79] 64 (06/01 0500) Cardiac Rhythm:  [-] Sinus bradycardia (06/01 0743) Resp:  [13-27] 18 (06/01 0500) BP: (124-159)/(63-85) 159/63 mmHg (06/01 0500) SpO2:  [93 %-100 %] 94 % (06/01 0500)  CBC:  Recent Labs Lab 06/27/15 2122 06/27/15 2134  WBC 9.1  --   NEUTROABS 4.4  --   HGB 13.1 14.6  HCT 39.7 43.0  MCV 84.3  --   PLT 183  --     Basic Metabolic Panel:  Recent Labs Lab 06/27/15 2122 06/27/15 2134 06/28/15 0148  NA 137 143  --   K 2.5* 2.6*  --   CL 107 107  --   CO2 14*  --   --   GLUCOSE 237* 245*  --   BUN 16 21*  --   CREATININE 1.26* 1.10*  --   CALCIUM  9.6  --   --   MG  --   --  2.1    Lipid Panel:    Component Value Date/Time   CHOL 109 06/28/2015 0148   TRIG 69 06/28/2015 0148   HDL 39* 06/28/2015 0148   CHOLHDL 2.8 06/28/2015 0148   VLDL 14 06/28/2015 0148   LDLCALC 56 06/28/2015 0148   HgbA1c:  Lab Results  Component Value Date   HGBA1C 6.0 10/17/2013   Urine Drug Screen:    Component Value Date/Time   LABOPIA NONE DETECTED 06/28/2015 0006   COCAINSCRNUR NONE DETECTED 06/28/2015 0006   LABBENZ NONE DETECTED 06/28/2015 0006   AMPHETMU NONE DETECTED 06/28/2015 0006   THCU NONE DETECTED 06/28/2015 0006   LABBARB NONE DETECTED 06/28/2015 0006      IMAGING  Ct Head Wo Contrast  06/27/2015  CLINICAL DATA:  Code stroke. Left-sided gaze, headache and weakness. Initial encounter. EXAM: CT HEAD WITHOUT CONTRAST TECHNIQUE: Contiguous axial images were obtained from the base of the skull through the vertex without intravenous contrast. COMPARISON:  MRI of the brain performed 02/22/2014 FINDINGS: There is no evidence of acute infarction, mass lesion, or intra- or extra-axial hemorrhage on CT. Prominence of the ventricles and sulci reflects mild cortical volume loss. Mild periventricular white matter change likely reflects small vessel ischemic microangiopathy. A small chronic lacunar infarct is noted at the  right cerebellar hemisphere. The brainstem and fourth ventricle are within normal limits. The basal ganglia are unremarkable in appearance. The cerebral hemispheres demonstrate grossly normal gray-white differentiation. No mass effect or midline shift is seen. There is no evidence of fracture; visualized osseous structures are unremarkable in appearance. The orbits are within normal limits. The paranasal sinuses and mastoid air cells are well-aerated. No significant soft tissue abnormalities are seen. IMPRESSION: 1. No acute intracranial pathology seen on CT. 2. Mild cortical volume loss and scattered small vessel ischemic  microangiopathy. 3. Small chronic lacunar infarct at the right cerebellar hemisphere. These results were called by telephone at the time of interpretation on 06/27/2015 at 9:37 pm to Dr. Roseanne RenoStewart, who verbally acknowledged these results. Electronically Signed   By: Roanna RaiderJeffery  Chang M.D.   On: 06/27/2015 21:37   Mr Brain Wo Contrast  06/28/2015  CLINICAL DATA:  Initial valuation for acute dizziness. History of hypertension, hyperlipidemia, diabetes. EXAM: MRI HEAD WITHOUT CONTRAST TECHNIQUE: Multiplanar, multiecho pulse sequences of the brain and surrounding structures were obtained without intravenous contrast. COMPARISON:  Prior CT from 06/27/2015. FINDINGS: Mildly advanced cerebral atrophy for patient age. Patchy and confluent T2/FLAIR hyperintensity within the periventricular white matter most like related chronic small vessel ischemic disease. Chronic small vessel ischemic changes present within the pons as well. This is fairly mild nature. Remote lacunar infarct within the left caudate body. Probable additional small remote cortical infarct within the left occipital region (series 7, image 11). Probable additional small remote lacunar infarcts within the bilateral cerebellar hemispheres. Patchy restricted diffusion present within the superior aspect of the right cerebellar hemisphere near the middle cerebellar peduncle (series 4, image 14). This is positioned adjacent to the fourth ventricle with involvement of the vermis. No associated mass effect or hemorrhage. No other acute infarct. Gray-white matter differentiation otherwise maintained. Major intracranial vascular flow voids are preserved. No acute or chronic intracranial hemorrhage. No mass lesion, midline shift, or mass effect. No hydrocephalus. No extra-axial fluid collection. Major dural sinuses are grossly patent. Craniocervical junction within normal limits. Mild multilevel degenerative spondylolysis noted within the visualized upper cervical spine  without significant stenosis. Incidental note made of an empty sella. No acute abnormality about the orbits. Mild mucosal thickening within the paranasal sinuses. No air-fluid levels to suggest active sinus infection. No mastoid effusion. Inner ear structures grossly normal. Bone marrow signal intensity within normal limits. No scalp soft tissue abnormality. IMPRESSION: 1. Small patchy acute right cerebellar infarcts as above. No associated hemorrhage or mass effect. 2. Remote lacunar infarcts involving the left caudate and bilateral cerebellar hemispheres, with additional small remote cortical infarct within the left occipital lobe. 3. Mild atrophy with chronic small vessel ischemic disease. Electronically Signed   By: Rise MuBenjamin  McClintock M.D.   On: 06/28/2015 04:59    PHYSICAL EXAM Pleasant middle-aged African-American lady currently not in distress. . Afebrile. Head is nontraumatic. Neck is supple without bruit.    Cardiac exam no murmur or gallop. Lungs are clear to auscultation. Distal pulses are well felt. Neurological Exam :  Awake alert oriented 3 with normal speech and language function. No aphasia or apraxia dysarthria. Extraocular movements are full range without nystagmus. Mild saccadic dysmetria on lateral gaze bilaterally. Pupils equal reactive. Vision acuity is adequate. Fundi were not visualized. Face is symmetric without weakness. Tongue is midline. Motor system exam reveals symmetric upper and lower extremity strength. Mild diminished fine finger movements on the right. Mild right finger-to-nose and knee to heel dysmetria present. Normal sensation.  Deep tendon reflexes are symmetric. Plantars are downgoing. Gait was not tested. ASSESSMENT/PLAN [name] is a 65 y.o. female with history of hypertension, diabetes mellitus, pancreatitis, hypothyroidism and dementia presenting with diaphoresis, nausea, vomiting, SOB, R sided HA and slowed speech. She did not receive IV t-PA.    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  Resultant  Nausea & vomiting  MRI  Patchy R cerebellar infarcts  CTA H & N  pending  2D Echo  pending  LDL 56  HgbA1c pending  Lovenox 40 mg sq daily for VTE prophylaxis  Diet heart healthy/carb modified Room service appropriate?: Yes; Fluid consistency:: Thin  aspirin 81 mg daily prior to admission, now on Plavix   Patient counseled to be compliant with her antithrombotic medications  Ongoing aggressive stroke risk pending  Disposition: pending  Hypertension  Stable  Permissive hypertension (OK if < 220/120) but gradually normalize in 5-7 days  Hyperlipidemia  Home meds:  lipitor 40, resumed in hospital  LDL 56, goal < 70  Continue statin at discharge  Diabetes type II  HgbA1c pending, goal < 7.0  Other Stroke Risk Factors  Cigarette smoker, advised to stop smoking     There is no weight on file to calculate BMI.   Other Active Problems  Baseline dementia - early onset Alzheimer's dementia without behavioral disturbance  Depression & anxiety  Hypokalemia  AKI  Protein calorie malnutrition  Hospital day #   I have personally examined this patient, reviewed notes, independently viewed imaging studies, participated in medical decision making and plan of care. I have made any additions or clarifications directly to the above note.  She presented with dizziness and patient difficulties secondary to right-sided cerebellar infarcts etiology to be determined . She remains at risk for neurological worsening, recurrent stroke, TIA and needs ongoing stroke evaluation and aggressive risk factor modification. Recommend change aspirin to Plavix for stroke prevention. Greater than 50% of time during this 25 minute visit was spent on counseling and coordination of care about stroke risk and prevention  Delia Heady, MD Medical Director [facility] Pager:  [phone] 06/28/2015 5:04 PM     To contact Stroke Continuity provider, please refer to [email or web address]. After hours, contact General Neurology

## 2015-06-28 NOTE — Progress Notes (Signed)
EEG completed full report to follow. °

## 2015-06-28 NOTE — ED Notes (Signed)
Dr Niu at bedside 

## 2015-06-28 NOTE — Care Management Note (Signed)
Case Management Note  Patient Details  Name: Dolan AmenYvonne M Hamrick MRN: [id number] Date of Birth: 02-Feb-1950  Subjective/Objective:                    Action/Plan: Patient was admitted with CVA. Lives at home alone. Will follow for discharge needs pending PT/OT evals and physician orders.  Expected Discharge Date:                  Expected Discharge Plan:     In-House Referral:     Discharge planning Services     Post Acute Care Choice:    Choice offered to:     DME Arranged:    DME Agency:     HH Arranged:    HH Agency:     Status of Service:  In process, will continue to follow  Medicare Important Message Given:    Date Medicare IM Given:    Medicare IM give by:    Date Additional Medicare IM Given:    Additional Medicare Important Message give by:     If discussed at Long Length of Stay Meetings, dates discussed:    Additional Comments:  Anda KraftRobarge, Tommaso Cavitt C, RN 06/28/2015, 10:35 AM [phone]

## 2015-06-28 NOTE — Progress Notes (Signed)
Initial Nutrition Assessment  DOCUMENTATION CODES:   Obesity unspecified  INTERVENTION:  Recommend checking Vitamin B 12 blood levels Provide Multivitamin with minerals daily Provide Glucerna Shake po TID, each supplement provides 220 kcal and 10 grams of protein   NUTRITION DIAGNOSIS:   Inadequate oral intake related to poor appetite as evidenced by per patient/family report, meal completion < 25%.   GOAL:   Patient will meet greater than or equal to 90% of their needs   MONITOR:   PO intake, Supplement acceptance, Skin, Labs  REASON FOR ASSESSMENT:   Consult Assessment of nutrition requirement/status  ASSESSMENT:   65 y.o. female with medical history significant of hypertension, hyperlipidemia, diabetes mellitus, hypothyroidism, depression, anxiety, tobacco abuse, dementia, pancreatitis, who presents with dizziness, left gaze, slow speech, diaphoresis, nausea, vomiting.  Pt tired at time of visit; lunch tray at bedside untouched. Pt reports not eating any breakfast either. Reports eating one meal daily for the past several months due to poor appetite, but she denies any weight loss.She is unsure how much she weighs. She eats 1 Mal[place]urkey sandwich and one fruit daily and drinks water and sugar-free beverages throughout the day.   Labs: low potassium  Diet Order:  Diet heart healthy/carb modified Room service appropriate?: Yes; Fluid consistency:: Thin  Skin:  Reviewed, no issues  Last BM:  5/31  Height:   Ht Readings from Last 1 Encounters:  05/04/15 5\' 5"  (1.651 m)    Weight:   Wt Readings from Last 1 Encounters:  05/04/15 202 lb (91.627 kg)    Ideal Body Weight:  56.8 kg  BMI:  There is no weight on file to calculate BMI.  Estimated Nutritional Needs:   Kcal:  1800-2000  Protein:  80-90 grams  Fluid:  2 L/day  EDUCATION NEEDS:   No education needs identified at this time  Dorothea Ogleeanne Nathalya Wolanski RD, LDN Inpatient Clinical Dietitian Pager:  [phone] After Hours Pager: [phone]

## 2015-06-28 NOTE — ED Notes (Signed)
Pt transferred from bed to bedside toilet with this RN and a tech. Pt needed moderate assistance and did complain of some vertigo.

## 2015-06-28 NOTE — ED Notes (Signed)
Attempted to call report

## 2015-06-28 NOTE — Progress Notes (Signed)
[facility] PROGRESS NOTE  Dolan AmenYvonne M Ragle ZOX:[id number] DOB: 1950-07-11 DOA: 06/27/2015 PCP: Lillia MountainGRIFFIN,JOHN JOSEPH, MD  Interim summary and HPI 65 y.o. female history of hypertension, diabetes mellitus, pancreatitis, hypothyroidism and dementia, brought to the emergency room by EMS and code stroke status following acute onset of feeling of weakness and numbness of breath as well as diaphoresis and slowed speech. Patient was also had nausea and vomiting. She reportedly had leftward gaze. There was no loss of consciousness. It's unclear whether she was actually confused. Blood sugar was 245. CT scan of her head showed no acute intracranial abnormality. Potassium was low at 2.6. BUN and creatinine was slightly elevated. Urinalysis is pending. Patient had no focal deficits on initial evaluation in the ED, including no eye movement abnormality. Speech was somewhat slowed but not dysarthric. NIH stroke score was 1 for not knowing her current age. Patient was noted to be diaphoretic and clammy. Code stroke was canceled. Patient was not administered IV t-PA. She was admitted for further evaluation and treatment.  Work up in process. Positive MRI for right cerebellar infarcts.   Assessment/Plan: Dizziness and left gaze: due to right cerebellar infarcts. CT head is negative for acute intracranial abnormalities, but showed a small lacunar infarct.  -EEG pending -given abnormal MRI will complete stroke work up -neurology on board and will follow rec's -secondary prevention with plavix  Nausea, vomiting: Etiology is not clear. Likely due to viral gastritis Vs associated with stroke. -had hx of chronic pancreatitis  -lipase WNL -continue IVF's -follow/replete electrolytes -PRN antiemetics  Depression and anxiety: no suicidal or homicidal ideations. -Continue home medications: PRN Xanax and daily Remeron/Zoloft  Hypokalemia: K=2.5 on admission. -still low -will continue repletion and follow  level -Mg WNL  HTN: -Holding lisinopril due to acute renal injury -Continue amlodipine -IV hydralazine PRN  HLD: Last LDL was 56 on 07/02/13 -will continue statins and follow lipid panel  DM-II: Last A1c 6.0 on 10/17/13, well controled. Patient is taking metformin at home; which is on hold due to AKI and inpatient status. -will continue SSI -follow A1c  Early onset Alzheimer's dementia without behavioral disturbance: -continue Donepezil  Tobacco abuse: -cessation counseling provided -Nicotine patch ordered   AKI: Likely due to prerenal secondary to dehydration and continuation of ACEI and NSAIDs -continue IVF as above - Follow up renal function trend -continue holding lisinopril and Mobic  Protein-calorie malnutrition, moderate (HCC): -Consultation to nutritional service in place -will follow rec's for feeding supplements    Code Status: Full Family Communication: no family at bedside Disposition Plan: complete work up for stroke, follow PT/OT recommendations for discharge; neurology on board, will follow rec's.   Consultants:  Neurology   Procedures:  MRI brain: with patchy right cerebellar infarcts   2-D echo: pending  Ct angio of head and neck: pending   Antibiotics:  None   HPI/Subjective: Reports feeling weak, dizzy and with HA's. No dysarthria on exam.  Objective: Filed Vitals:   06/28/15 0500 06/28/15 1518  BP: 159/63 153/73  Pulse: 64 64  Temp:  99 F (37.2 C)  Resp: 18 20    Intake/Output Summary (Last 24 hours) at 06/28/15 1708 Last data filed at 06/28/15 0600  Gross per 24 hour  Intake      0 ml  Output    200 ml  Net   -200 ml   There were no vitals filed for this visit.  Exam:   General: afebrile, denies CP and SOB. Feeling weak and dizzy. No  further nausea or vomiting. Patient w/o dysarthria.  Cardiovascular: S1 and S2, no rubs or gallops  Respiratory: good air movement, no wheezing, no crackles  Abdomen: soft, NT, ND,  positive BS  Musculoskeletal: no edema or cyanosis   Neuro: EOMI, grossly normal CN; MS 4//5 due to poor effort. Some right ataxia reported by neurology service after assessment.  Data Reviewed: Basic Metabolic Panel:  Recent Labs Lab 06/27/15 2122 06/27/15 2134 06/28/15 0148  NA 137 143  --   K 2.5* 2.6*  --   CL 107 107  --   CO2 14*  --   --   GLUCOSE 237* 245*  --   BUN 16 21*  --   CREATININE 1.26* 1.10*  --   CALCIUM 9.6  --   --   MG  --   --  2.1   Liver Function Tests:  Recent Labs Lab 06/27/15 2122 06/28/15 0148  AST 32 30  ALT 19 21  ALKPHOS 78 77  BILITOT 0.7 0.4  PROT 8.0 7.4  ALBUMIN 3.7 3.5    Recent Labs Lab 06/28/15 0148  LIPASE 41   CBC:  Recent Labs Lab 06/27/15 2122 06/27/15 2134  WBC 9.1  --   NEUTROABS 4.4  --   HGB 13.1 14.6  HCT 39.7 43.0  MCV 84.3  --   PLT 183  --    Cardiac Enzymes:  Recent Labs Lab 06/28/15 0148 06/28/15 0524 06/28/15 1133  TROPONINI <0.03 <0.03 <0.03   CBG:  Recent Labs Lab 06/27/15 2148 06/28/15 0418 06/28/15 0718 06/28/15 1138 06/28/15 1649  GLUCAP 265* 147* 114* 96 101*   Studies: Ct Angio Head W/cm &/or Wo Cm  06/28/2015  CLINICAL DATA:  65 year old female with acute right cerebellar infarcts, SCA territory, discovered during evaluation for acute dizziness. Initial encounter. EXAM: CT ANGIOGRAPHY HEAD AND NECK TECHNIQUE: Multidetector CT imaging of the head and neck was performed using the standard protocol during bolus administration of intravenous contrast. Multiplanar CT image reconstructions and MIPs were obtained to evaluate the vascular anatomy. Carotid stenosis measurements (when applicable) are obtained utilizing NASCET criteria, using the distal internal carotid diameter as the denominator. CONTRAST:  50 mL Isovue 370 COMPARISON:  Brain MRI 0320 hours today, and earlier. FINDINGS: CTA NECK Skeleton: Widespread cervical spine degeneration. No acute osseous abnormality identified.  Visualized paranasal sinuses and mastoids are clear. Other neck: Centrilobular emphysema and dependent opacity in the lung apices, favor the latter due to atelectasis. No superior mediastinal lymphadenopathy. Negative thyroid, larynx, pharynx, parapharyngeal spaces, sublingual space, submandibular glands and parotid glands. Retropharyngeal spaces are normal aside from a retropharyngeal course of both carotids, especially the left. No cervical lymphadenopathy. Aortic arch: Bovine type arch configuration. Tortuous proximal great vessels. Mild to moderate arch and proximal great vessel calcified atherosclerosis. Right carotid system: Tortuous brachiocephalic artery with a kinked appearance (coronal image 65). Normal right CCA origin. Partially retropharyngeal course of the right CCA. Negative right carotid bifurcation. Minimal calcified plaque right ICA bulb with no stenosis. Tortuous cervical right ICA with a kinked appearance at the C1 level (series 407, image 20). Left carotid system: No left CCA origin stenosis. Tortuous proximal left CCA. Retropharyngeal course of the left CCA and left ICA. Negative left carotid bifurcation. Mildly tortuous cervical left ICA. Vertebral arteries:No proximal right subclavian artery stenosis. Normal right vertebral artery origin. Normal right vertebral artery to the skullbase. No proximal left subclavian artery stenosis. Mild calcified plaque near the left vertebral artery origin with no stenosis.  Tortuous left V1 segment. Negative left vertebral artery otherwise to the skullbase. CTA HEAD Posterior circulation: Early origin of the left PICA which otherwise appears normal. The right PICA is diminutive or absent. No distal vertebral artery stenosis. Normal vertebrobasilar junction. Normal right AICA origin. No basilar stenosis. There appears to be a shared origin of the right PCA and right SCA. The right SCA is faintly enhancing on series 404, image 102 - but is then quickly  attenuated. Normal left SCA. Normal bilateral PCA origins otherwise. Posterior communicating arteries are diminutive or absent. Normal PCA branches. Anterior circulation: Both ICA siphons are patent. There is mild to moderate calcified plaque primarily affecting the cavernous segments and greater on the left. There is moderate left cavernous segment stenosis as seen on series 404, image 120. No hemodynamically significant stenosis on the right. Normal ophthalmic artery origins. Normal carotid termini. Normal MCA and ACA origins. Mild irregularity and stenosis of the right ACA A1 segment. Normal left A1. Anterior communicating artery and bilateral ACA branches are within normal limits. Left MCA M1 segment, bifurcation, and left MCA branches are within normal limits. Right MCA M1 segment, bifurcation, and right MCA branches are within normal limits. Venous sinuses: Superior sagittal, transverse, and sigmoid sinuses are patent. Anatomic variants: Bovine type aortic arch configuration. IMPRESSION: 1. Positive for Poor flow in the Right SCA, concordant with the acute superior right cerebellar infarcts demonstrated this morning. 2. Negative for emergent large vessel occlusion, and the posterior circulation is otherwise negative. 3. Anterior circulation remarkable for tortuous carotid arteries with left greater than right retropharyngeal course, and calcified ICA siphon atherosclerosis resulting in Moderate Left ICA cavernous segment Stenosis. 4. Emphysema and atelectasis in the lung apices. Electronically Signed   By: Odessa Fleming M.D.   On: 06/28/2015 13:49   Ct Head Wo Contrast  06/27/2015  CLINICAL DATA:  Code stroke. Left-sided gaze, headache and weakness. Initial encounter. EXAM: CT HEAD WITHOUT CONTRAST TECHNIQUE: Contiguous axial images were obtained from the base of the skull through the vertex without intravenous contrast. COMPARISON:  MRI of the brain performed 02/22/2014 FINDINGS: There is no evidence of acute  infarction, mass lesion, or intra- or extra-axial hemorrhage on CT. Prominence of the ventricles and sulci reflects mild cortical volume loss. Mild periventricular white matter change likely reflects small vessel ischemic microangiopathy. A small chronic lacunar infarct is noted at the right cerebellar hemisphere. The brainstem and fourth ventricle are within normal limits. The basal ganglia are unremarkable in appearance. The cerebral hemispheres demonstrate grossly normal gray-white differentiation. No mass effect or midline shift is seen. There is no evidence of fracture; visualized osseous structures are unremarkable in appearance. The orbits are within normal limits. The paranasal sinuses and mastoid air cells are well-aerated. No significant soft tissue abnormalities are seen. IMPRESSION: 1. No acute intracranial pathology seen on CT. 2. Mild cortical volume loss and scattered small vessel ischemic microangiopathy. 3. Small chronic lacunar infarct at the right cerebellar hemisphere. These results were called by telephone at the time of interpretation on 06/27/2015 at 9:37 pm to Dr. Roseanne Reno, who verbally acknowledged these results. Electronically Signed   By: Roanna Raider M.D.   On: 06/27/2015 21:37   Ct Angio Neck W/cm &/or Wo/cm  06/28/2015  CLINICAL DATA:  65 year old female with acute right cerebellar infarcts, SCA territory, discovered during evaluation for acute dizziness. Initial encounter. EXAM: CT ANGIOGRAPHY HEAD AND NECK TECHNIQUE: Multidetector CT imaging of the head and neck was performed using the standard protocol during bolus  administration of intravenous contrast. Multiplanar CT image reconstructions and MIPs were obtained to evaluate the vascular anatomy. Carotid stenosis measurements (when applicable) are obtained utilizing NASCET criteria, using the distal internal carotid diameter as the denominator. CONTRAST:  50 mL Isovue 370 COMPARISON:  Brain MRI 0320 hours today, and earlier.  FINDINGS: CTA NECK Skeleton: Widespread cervical spine degeneration. No acute osseous abnormality identified. Visualized paranasal sinuses and mastoids are clear. Other neck: Centrilobular emphysema and dependent opacity in the lung apices, favor the latter due to atelectasis. No superior mediastinal lymphadenopathy. Negative thyroid, larynx, pharynx, parapharyngeal spaces, sublingual space, submandibular glands and parotid glands. Retropharyngeal spaces are normal aside from a retropharyngeal course of both carotids, especially the left. No cervical lymphadenopathy. Aortic arch: Bovine type arch configuration. Tortuous proximal great vessels. Mild to moderate arch and proximal great vessel calcified atherosclerosis. Right carotid system: Tortuous brachiocephalic artery with a kinked appearance (coronal image 65). Normal right CCA origin. Partially retropharyngeal course of the right CCA. Negative right carotid bifurcation. Minimal calcified plaque right ICA bulb with no stenosis. Tortuous cervical right ICA with a kinked appearance at the C1 level (series 407, image 20). Left carotid system: No left CCA origin stenosis. Tortuous proximal left CCA. Retropharyngeal course of the left CCA and left ICA. Negative left carotid bifurcation. Mildly tortuous cervical left ICA. Vertebral arteries:No proximal right subclavian artery stenosis. Normal right vertebral artery origin. Normal right vertebral artery to the skullbase. No proximal left subclavian artery stenosis. Mild calcified plaque near the left vertebral artery origin with no stenosis. Tortuous left V1 segment. Negative left vertebral artery otherwise to the skullbase. CTA HEAD Posterior circulation: Early origin of the left PICA which otherwise appears normal. The right PICA is diminutive or absent. No distal vertebral artery stenosis. Normal vertebrobasilar junction. Normal right AICA origin. No basilar stenosis. There appears to be a shared origin of the right  PCA and right SCA. The right SCA is faintly enhancing on series 404, image 102 - but is then quickly attenuated. Normal left SCA. Normal bilateral PCA origins otherwise. Posterior communicating arteries are diminutive or absent. Normal PCA branches. Anterior circulation: Both ICA siphons are patent. There is mild to moderate calcified plaque primarily affecting the cavernous segments and greater on the left. There is moderate left cavernous segment stenosis as seen on series 404, image 120. No hemodynamically significant stenosis on the right. Normal ophthalmic artery origins. Normal carotid termini. Normal MCA and ACA origins. Mild irregularity and stenosis of the right ACA A1 segment. Normal left A1. Anterior communicating artery and bilateral ACA branches are within normal limits. Left MCA M1 segment, bifurcation, and left MCA branches are within normal limits. Right MCA M1 segment, bifurcation, and right MCA branches are within normal limits. Venous sinuses: Superior sagittal, transverse, and sigmoid sinuses are patent. Anatomic variants: Bovine type aortic arch configuration. IMPRESSION: 1. Positive for Poor flow in the Right SCA, concordant with the acute superior right cerebellar infarcts demonstrated this morning. 2. Negative for emergent large vessel occlusion, and the posterior circulation is otherwise negative. 3. Anterior circulation remarkable for tortuous carotid arteries with left greater than right retropharyngeal course, and calcified ICA siphon atherosclerosis resulting in Moderate Left ICA cavernous segment Stenosis. 4. Emphysema and atelectasis in the lung apices. Electronically Signed   By: Odessa Fleming M.D.   On: 06/28/2015 13:49   Mr Brain Wo Contrast  06/28/2015  CLINICAL DATA:  Initial valuation for acute dizziness. History of hypertension, hyperlipidemia, diabetes. EXAM: MRI HEAD WITHOUT CONTRAST TECHNIQUE: Multiplanar,  multiecho pulse sequences of the brain and surrounding structures were  obtained without intravenous contrast. COMPARISON:  Prior CT from 06/27/2015. FINDINGS: Mildly advanced cerebral atrophy for patient age. Patchy and confluent T2/FLAIR hyperintensity within the periventricular white matter most like related chronic small vessel ischemic disease. Chronic small vessel ischemic changes present within the pons as well. This is fairly mild nature. Remote lacunar infarct within the left caudate body. Probable additional small remote cortical infarct within the left occipital region (series 7, image 11). Probable additional small remote lacunar infarcts within the bilateral cerebellar hemispheres. Patchy restricted diffusion present within the superior aspect of the right cerebellar hemisphere near the middle cerebellar peduncle (series 4, image 14). This is positioned adjacent to the fourth ventricle with involvement of the vermis. No associated mass effect or hemorrhage. No other acute infarct. Gray-white matter differentiation otherwise maintained. Major intracranial vascular flow voids are preserved. No acute or chronic intracranial hemorrhage. No mass lesion, midline shift, or mass effect. No hydrocephalus. No extra-axial fluid collection. Major dural sinuses are grossly patent. Craniocervical junction within normal limits. Mild multilevel degenerative spondylolysis noted within the visualized upper cervical spine without significant stenosis. Incidental note made of an empty sella. No acute abnormality about the orbits. Mild mucosal thickening within the paranasal sinuses. No air-fluid levels to suggest active sinus infection. No mastoid effusion. Inner ear structures grossly normal. Bone marrow signal intensity within normal limits. No scalp soft tissue abnormality. IMPRESSION: 1. Small patchy acute right cerebellar infarcts as above. No associated hemorrhage or mass effect. 2. Remote lacunar infarcts involving the left caudate and bilateral cerebellar hemispheres, with additional  small remote cortical infarct within the left occipital lobe. 3. Mild atrophy with chronic small vessel ischemic disease. Electronically Signed   By: Rise Mu M.D.   On: 06/28/2015 04:59    Scheduled Meds: .  stroke: mapping our early stages of recovery book   Does not apply Once  . ALPRAZolam  0.5 mg Oral Daily  . amLODipine  5 mg Oral Daily  . aspirin EC  81 mg Oral Daily  . atorvastatin  40 mg Oral Daily  . calcium carbonate  1,250 mg Oral Q breakfast  . donepezil  10 mg Oral QHS  . enoxaparin (LOVENOX) injection  40 mg Subcutaneous Daily  . feeding supplement (GLUCERNA SHAKE)  237 mL Oral TID BM  . insulin aspart  0-5 Units Subcutaneous QHS  . insulin aspart  0-9 Units Subcutaneous TID WC  . iopamidol      . levothyroxine  50 mcg Oral QAC breakfast  . multivitamin with minerals  1 tablet Oral Daily  . nicotine  21 mg Transdermal Daily  . potassium chloride  10 mEq Intravenous Q1 Hr x 2  . sertraline  100 mg Oral BID   Continuous Infusions: . sodium chloride      Principal Problem:   Dizziness Active Problems:   Major depressive disorder (HCC)   Hypertension   Chronic back pain   Hyperlipidemia   Diabetes mellitus, type 2 (HCC)   Early onset Alzheimer's dementia without behavioral disturbance   Tobacco abuse   Anxiety   Hypokalemia   AKI (acute kidney injury) (HCC)   Protein-calorie malnutrition, moderate (HCC)   Nausea & vomiting   Acute CVA (cerebrovascular accident) (HCC)    Time spent: 35 minutes    Vassie Loll  [facility] Pager [phone]. If 7PM-7AM, please contact night-coverage at [email or web address], password [facility] 06/28/2015, 5:08 PM  LOS: 0 days

## 2015-06-29 ENCOUNTER — Inpatient Hospital Stay (HOSPITAL_COMMUNITY): Payer: Medicare Other

## 2015-06-29 DIAGNOSIS — G300 Alzheimer's disease with early onset: Secondary | ICD-10-CM

## 2015-06-29 DIAGNOSIS — F0280 Dementia in other diseases classified elsewhere without behavioral disturbance: Secondary | ICD-10-CM

## 2015-06-29 DIAGNOSIS — F419 Anxiety disorder, unspecified: Secondary | ICD-10-CM

## 2015-06-29 LAB — BASIC METABOLIC PANEL
Anion gap: 4 — ABNORMAL LOW (ref 5–15)
BUN: 12 mg/dL (ref 6–20)
CALCIUM: 9.7 mg/dL (ref 8.9–10.3)
CO2: 24 mmol/L (ref 22–32)
CREATININE: 1 mg/dL (ref 0.44–1.00)
Chloride: 108 mmol/L (ref 101–111)
GFR calc Af Amer: >60 mL/min (ref >60)
GFR, EST NON AFRICAN AMERICAN: 58 mL/min — AB (ref >60)
GLUCOSE: 105 mg/dL — AB (ref 65–99)
Potassium: 4.8 mmol/L (ref 3.5–5.1)
Sodium: 136 mmol/L (ref 135–145)

## 2015-06-29 LAB — GLUCOSE, CAPILLARY
Glucose-Capillary: 105 mg/dL — ABNORMAL HIGH (ref 65–99)
Glucose-Capillary: 148 mg/dL — ABNORMAL HIGH (ref 65–99)
Glucose-Capillary: 108 mg/dL — ABNORMAL HIGH (ref 65–99)
Glucose-Capillary: 88 mg/dL (ref 65–99)
Glucose-Capillary: 120 mg/dL — ABNORMAL HIGH (ref 65–99)

## 2015-06-29 LAB — MAGNESIUM: Magnesium: 1.6 mg/dL — ABNORMAL LOW (ref 1.7–2.4)

## 2015-06-29 LAB — HEMOGLOBIN A1C
Hgb A1c MFr Bld: 6.4 % — ABNORMAL HIGH (ref 4.8–5.6)
MEAN PLASMA GLUCOSE: 137 mg/dL

## 2015-06-29 MED ORDER — IOPAMIDOL (ISOVUE-370) INJECTION 76%
50.0000 mL | Freq: Once | INTRAVENOUS | Status: AC | PRN
  Administered 2015-06-28: 50 mL via INTRAVENOUS

## 2015-06-29 NOTE — Progress Notes (Signed)
TEE information given and explained.    Sim BoastHavy, RN

## 2015-06-29 NOTE — Progress Notes (Signed)
STROKE TEAM PROGRESS NOTE   SUBJECTIVE (INTERVAL HISTORY) Patient feels slightly better right-sided hemiataxia is improving. No new complaints. CT angiogram shows abrupt occlusion of the right superior cerebellar artery without significant stenosis of either vertebral or basilar arteries.   OBJECTIVE Temp:  [97.8 F (36.6 C)-99 F (37.2 C)] 98.6 F (37 C) (06/02 0505) Pulse Rate:  [58-78] 58 (06/02 0505) Cardiac Rhythm:  [-] Sinus bradycardia (06/02 0709) Resp:  [15-20] 18 (06/02 0505) BP: (125-153)/(64-83) 138/82 mmHg (06/02 0505) SpO2:  [92 %-96 %] 95 % (06/02 0505)  CBC:   Recent Labs Lab 06/27/15 2122 06/27/15 2134  WBC 9.1  --   NEUTROABS 4.4  --   HGB 13.1 14.6  HCT 39.7 43.0  MCV 84.3  --   PLT 183  --    Basic Metabolic Panel:   Recent Labs Lab 06/27/15 2122 06/27/15 2134 06/28/15 0148 06/28/15 1813 06/29/15 0602  NA 137 143  --   --  136  K 2.5* 2.6*  --  4.5 4.8  CL 107 107  --   --  108  CO2 14*  --   --   --  24  GLUCOSE 237* 245*  --   --  105*  BUN 16 21*  --   --  12  CREATININE 1.26* 1.10*  --   --  1.00  CALCIUM 9.6  --   --   --  9.7  MG  --   --  2.1  --  1.6*   Lipid Panel:     Component Value Date/Time   CHOL 109 06/28/2015 0148   TRIG 69 06/28/2015 0148   HDL 39* 06/28/2015 0148   CHOLHDL 2.8 06/28/2015 0148   VLDL 14 06/28/2015 0148   LDLCALC 56 06/28/2015 0148   HgbA1c:  Lab Results  Component Value Date   HGBA1C 6.4* 06/28/2015   Urine Drug Screen:     Component Value Date/Time   LABOPIA NONE DETECTED 06/28/2015 0006   COCAINSCRNUR NONE DETECTED 06/28/2015 0006   LABBENZ NONE DETECTED 06/28/2015 0006   AMPHETMU NONE DETECTED 06/28/2015 0006   THCU NONE DETECTED 06/28/2015 0006   LABBARB NONE DETECTED 06/28/2015 0006      IMAGING  Ct Angio Head W/cm &/or Wo Cm  06/28/2015  CLINICAL DATA:  65 year old female with acute right cerebellar infarcts, SCA territory, discovered during evaluation for acute dizziness. Initial  encounter. EXAM: CT ANGIOGRAPHY HEAD AND NECK TECHNIQUE: Multidetector CT imaging of the head and neck was performed using the standard protocol during bolus administration of intravenous contrast. Multiplanar CT image reconstructions and MIPs were obtained to evaluate the vascular anatomy. Carotid stenosis measurements (when applicable) are obtained utilizing NASCET criteria, using the distal internal carotid diameter as the denominator. CONTRAST:  50 mL Isovue 370 COMPARISON:  Brain MRI 0320 hours today, and earlier. FINDINGS: CTA NECK Skeleton: Widespread cervical spine degeneration. No acute osseous abnormality identified. Visualized paranasal sinuses and mastoids are clear. Other neck: Centrilobular emphysema and dependent opacity in the lung apices, favor the latter due to atelectasis. No superior mediastinal lymphadenopathy. Negative thyroid, larynx, pharynx, parapharyngeal spaces, sublingual space, submandibular glands and parotid glands. Retropharyngeal spaces are normal aside from a retropharyngeal course of both carotids, especially the left. No cervical lymphadenopathy. Aortic arch: Bovine type arch configuration. Tortuous proximal great vessels. Mild to moderate arch and proximal great vessel calcified atherosclerosis. Right carotid system: Tortuous brachiocephalic artery with a kinked appearance (coronal image 65). Normal right CCA origin. Partially retropharyngeal course  of the right CCA. Negative right carotid bifurcation. Minimal calcified plaque right ICA bulb with no stenosis. Tortuous cervical right ICA with a kinked appearance at the C1 level (series 407, image 20). Left carotid system: No left CCA origin stenosis. Tortuous proximal left CCA. Retropharyngeal course of the left CCA and left ICA. Negative left carotid bifurcation. Mildly tortuous cervical left ICA. Vertebral arteries:No proximal right subclavian artery stenosis. Normal right vertebral artery origin. Normal right vertebral artery  to the skullbase. No proximal left subclavian artery stenosis. Mild calcified plaque near the left vertebral artery origin with no stenosis. Tortuous left V1 segment. Negative left vertebral artery otherwise to the skullbase. CTA HEAD Posterior circulation: Early origin of the left PICA which otherwise appears normal. The right PICA is diminutive or absent. No distal vertebral artery stenosis. Normal vertebrobasilar junction. Normal right AICA origin. No basilar stenosis. There appears to be a shared origin of the right PCA and right SCA. The right SCA is faintly enhancing on series 404, image 102 - but is then quickly attenuated. Normal left SCA. Normal bilateral PCA origins otherwise. Posterior communicating arteries are diminutive or absent. Normal PCA branches. Anterior circulation: Both ICA siphons are patent. There is mild to moderate calcified plaque primarily affecting the cavernous segments and greater on the left. There is moderate left cavernous segment stenosis as seen on series 404, image 120. No hemodynamically significant stenosis on the right. Normal ophthalmic artery origins. Normal carotid termini. Normal MCA and ACA origins. Mild irregularity and stenosis of the right ACA A1 segment. Normal left A1. Anterior communicating artery and bilateral ACA branches are within normal limits. Left MCA M1 segment, bifurcation, and left MCA branches are within normal limits. Right MCA M1 segment, bifurcation, and right MCA branches are within normal limits. Venous sinuses: Superior sagittal, transverse, and sigmoid sinuses are patent. Anatomic variants: Bovine type aortic arch configuration. IMPRESSION: 1. Positive for Poor flow in the Right SCA, concordant with the acute superior right cerebellar infarcts demonstrated this morning. 2. Negative for emergent large vessel occlusion, and the posterior circulation is otherwise negative. 3. Anterior circulation remarkable for tortuous carotid arteries with left  greater than right retropharyngeal course, and calcified ICA siphon atherosclerosis resulting in Moderate Left ICA cavernous segment Stenosis. 4. Emphysema and atelectasis in the lung apices. Electronically Signed   By: Odessa Fleming M.D.   On: 06/28/2015 13:49   Ct Head Wo Contrast  06/27/2015  CLINICAL DATA:  Code stroke. Left-sided gaze, headache and weakness. Initial encounter. EXAM: CT HEAD WITHOUT CONTRAST TECHNIQUE: Contiguous axial images were obtained from the base of the skull through the vertex without intravenous contrast. COMPARISON:  MRI of the brain performed 02/22/2014 FINDINGS: There is no evidence of acute infarction, mass lesion, or intra- or extra-axial hemorrhage on CT. Prominence of the ventricles and sulci reflects mild cortical volume loss. Mild periventricular white matter change likely reflects small vessel ischemic microangiopathy. A small chronic lacunar infarct is noted at the right cerebellar hemisphere. The brainstem and fourth ventricle are within normal limits. The basal ganglia are unremarkable in appearance. The cerebral hemispheres demonstrate grossly normal gray-white differentiation. No mass effect or midline shift is seen. There is no evidence of fracture; visualized osseous structures are unremarkable in appearance. The orbits are within normal limits. The paranasal sinuses and mastoid air cells are well-aerated. No significant soft tissue abnormalities are seen. IMPRESSION: 1. No acute intracranial pathology seen on CT. 2. Mild cortical volume loss and scattered small vessel ischemic microangiopathy. 3. Small  chronic lacunar infarct at the right cerebellar hemisphere. These results were called by telephone at the time of interpretation on 06/27/2015 at 9:37 pm to Dr. Roseanne RenoStewart, who verbally acknowledged these results. Electronically Signed   By: Roanna RaiderJeffery  Chang M.D.   On: 06/27/2015 21:37   Ct Angio Neck W/cm &/or Wo/cm  06/28/2015  CLINICAL DATA:  65 year old female with acute  right cerebellar infarcts, SCA territory, discovered during evaluation for acute dizziness. Initial encounter. EXAM: CT ANGIOGRAPHY HEAD AND NECK TECHNIQUE: Multidetector CT imaging of the head and neck was performed using the standard protocol during bolus administration of intravenous contrast. Multiplanar CT image reconstructions and MIPs were obtained to evaluate the vascular anatomy. Carotid stenosis measurements (when applicable) are obtained utilizing NASCET criteria, using the distal internal carotid diameter as the denominator. CONTRAST:  50 mL Isovue 370 COMPARISON:  Brain MRI 0320 hours today, and earlier. FINDINGS: CTA NECK Skeleton: Widespread cervical spine degeneration. No acute osseous abnormality identified. Visualized paranasal sinuses and mastoids are clear. Other neck: Centrilobular emphysema and dependent opacity in the lung apices, favor the latter due to atelectasis. No superior mediastinal lymphadenopathy. Negative thyroid, larynx, pharynx, parapharyngeal spaces, sublingual space, submandibular glands and parotid glands. Retropharyngeal spaces are normal aside from a retropharyngeal course of both carotids, especially the left. No cervical lymphadenopathy. Aortic arch: Bovine type arch configuration. Tortuous proximal great vessels. Mild to moderate arch and proximal great vessel calcified atherosclerosis. Right carotid system: Tortuous brachiocephalic artery with a kinked appearance (coronal image 65). Normal right CCA origin. Partially retropharyngeal course of the right CCA. Negative right carotid bifurcation. Minimal calcified plaque right ICA bulb with no stenosis. Tortuous cervical right ICA with a kinked appearance at the C1 level (series 407, image 20). Left carotid system: No left CCA origin stenosis. Tortuous proximal left CCA. Retropharyngeal course of the left CCA and left ICA. Negative left carotid bifurcation. Mildly tortuous cervical left ICA. Vertebral arteries:No proximal  right subclavian artery stenosis. Normal right vertebral artery origin. Normal right vertebral artery to the skullbase. No proximal left subclavian artery stenosis. Mild calcified plaque near the left vertebral artery origin with no stenosis. Tortuous left V1 segment. Negative left vertebral artery otherwise to the skullbase. CTA HEAD Posterior circulation: Early origin of the left PICA which otherwise appears normal. The right PICA is diminutive or absent. No distal vertebral artery stenosis. Normal vertebrobasilar junction. Normal right AICA origin. No basilar stenosis. There appears to be a shared origin of the right PCA and right SCA. The right SCA is faintly enhancing on series 404, image 102 - but is then quickly attenuated. Normal left SCA. Normal bilateral PCA origins otherwise. Posterior communicating arteries are diminutive or absent. Normal PCA branches. Anterior circulation: Both ICA siphons are patent. There is mild to moderate calcified plaque primarily affecting the cavernous segments and greater on the left. There is moderate left cavernous segment stenosis as seen on series 404, image 120. No hemodynamically significant stenosis on the right. Normal ophthalmic artery origins. Normal carotid termini. Normal MCA and ACA origins. Mild irregularity and stenosis of the right ACA A1 segment. Normal left A1. Anterior communicating artery and bilateral ACA branches are within normal limits. Left MCA M1 segment, bifurcation, and left MCA branches are within normal limits. Right MCA M1 segment, bifurcation, and right MCA branches are within normal limits. Venous sinuses: Superior sagittal, transverse, and sigmoid sinuses are patent. Anatomic variants: Bovine type aortic arch configuration. IMPRESSION: 1. Positive for Poor flow in the Right SCA, concordant with the acute  superior right cerebellar infarcts demonstrated this morning. 2. Negative for emergent large vessel occlusion, and the posterior circulation  is otherwise negative. 3. Anterior circulation remarkable for tortuous carotid arteries with left greater than right retropharyngeal course, and calcified ICA siphon atherosclerosis resulting in Moderate Left ICA cavernous segment Stenosis. 4. Emphysema and atelectasis in the lung apices. Electronically Signed   By: Odessa Fleming M.D.   On: 06/28/2015 13:49   Mr Brain Wo Contrast  06/28/2015  CLINICAL DATA:  Initial valuation for acute dizziness. History of hypertension, hyperlipidemia, diabetes. EXAM: MRI HEAD WITHOUT CONTRAST TECHNIQUE: Multiplanar, multiecho pulse sequences of the brain and surrounding structures were obtained without intravenous contrast. COMPARISON:  Prior CT from 06/27/2015. FINDINGS: Mildly advanced cerebral atrophy for patient age. Patchy and confluent T2/FLAIR hyperintensity within the periventricular white matter most like related chronic small vessel ischemic disease. Chronic small vessel ischemic changes present within the pons as well. This is fairly mild nature. Remote lacunar infarct within the left caudate body. Probable additional small remote cortical infarct within the left occipital region (series 7, image 11). Probable additional small remote lacunar infarcts within the bilateral cerebellar hemispheres. Patchy restricted diffusion present within the superior aspect of the right cerebellar hemisphere near the middle cerebellar peduncle (series 4, image 14). This is positioned adjacent to the fourth ventricle with involvement of the vermis. No associated mass effect or hemorrhage. No other acute infarct. Gray-white matter differentiation otherwise maintained. Major intracranial vascular flow voids are preserved. No acute or chronic intracranial hemorrhage. No mass lesion, midline shift, or mass effect. No hydrocephalus. No extra-axial fluid collection. Major dural sinuses are grossly patent. Craniocervical junction within normal limits. Mild multilevel degenerative spondylolysis noted  within the visualized upper cervical spine without significant stenosis. Incidental note made of an empty sella. No acute abnormality about the orbits. Mild mucosal thickening within the paranasal sinuses. No air-fluid levels to suggest active sinus infection. No mastoid effusion. Inner ear structures grossly normal. Bone marrow signal intensity within normal limits. No scalp soft tissue abnormality. IMPRESSION: 1. Small patchy acute right cerebellar infarcts as above. No associated hemorrhage or mass effect. 2. Remote lacunar infarcts involving the left caudate and bilateral cerebellar hemispheres, with additional small remote cortical infarct within the left occipital lobe. 3. Mild atrophy with chronic small vessel ischemic disease. Electronically Signed   By: Rise Mu M.D.   On: 06/28/2015 04:59   EEG Unremarkable awake and drowsy routine inpatient EEG.   PHYSICAL EXAM Pleasant middle-aged African-American lady currently not in distress. . Afebrile. Head is nontraumatic. Neck is supple without bruit.    Cardiac exam no murmur or gallop. Lungs are clear to auscultation. Distal pulses are well felt. Neurological Exam :  Awake alert oriented 3 with normal speech and language function. No aphasia or apraxia dysarthria. Extraocular movements are full range without nystagmus. Mild saccadic dysmetria on lateral gaze bilaterally. Pupils equal reactive. Vision acuity is adequate. Fundi were not visualized. Face is symmetric without weakness. Tongue is midline. Motor system exam reveals symmetric upper and lower extremity strength. Mild diminished fine finger movements on the right. Mild right finger-to-nose and knee to heel dysmetria present. Normal sensation. Deep tendon reflexes are symmetric. Plantars are downgoing. Gait was not tested.   ASSESSMENT/PLAN Ms. JASMINEMARIE SHERRARD is a 65 y.o. female with history of hypertension, diabetes mellitus, pancreatitis, hypothyroidism and dementia presenting  with diaphoresis, nausea, vomiting, SOB, R sided HA and slowed speech. She did not receive IV t-PA.   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 Moderate Left ICA cavernous Stenosis. Emphysema and atelectasis in the lung apices.   2D Echo  pending  EEG Unremarkable  TEE to look for embolic source. Arranged with [facility] for Monday.  If positive for PFO (patent foramen ovale), check bilateral lower extremity venous dopplers to rule out DVT as possible source of stroke. (I have made patient NPO after midnight for Monday).  If TEE negative, a [facility] electrophysiologist will consult and consider placement of an implantable loop recorder to evaluate for atrial fibrillation as etiology of stroke. This has been explained to patient/family by Dr.Waco Foerster and they are agreeable.   LDL 56    HgbA1c 6.4  Lovenox 40 mg sq daily for VTE prophylaxis Diet heart healthy/carb modified Room service appropriate?: Yes; Fluid consistency:: Thin Diet NPO time specified  aspirin 81 mg daily prior to admission, now on Plavix   Patient counseled to be compliant with her antithrombotic medications  Ongoing stroke risk factor control  Therapy recommendations: SNF  Disposition: SNF  Hypertension  Stable  Permissive hypertension (OK if < 220/120) but gradually normalize in 5-7 days  Hyperlipidemia  Home meds:  lipitor 40, resumed in hospital  LDL 56, goal < 70  Continue statin at discharge  Diabetes type II  HgbA1c 6.4, goal < 7.0  Other Stroke Risk Factors  Cigarette smoker, advised to stop smoking     There is no weight on file to calculate BMI.   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 I have made any additions or clarifications directly to the above note. Patient has an embolic occlusion of the right superior cerebellar artery is a cause of her stroke and hence didn't further evaluation for source of embolus. We will arrange for TEE and loop recorder insertion to look for paroxysmal A. fib.  Delia Heady, MD Medical Director [facility] Pager: [phone] 06/29/2015 2:00 PM     To contact Stroke Continuity provider, please refer to [email or web address]. After hours, contact General Neurology

## 2015-06-29 NOTE — Progress Notes (Signed)
[facility] PROGRESS NOTE  [name] ZOX:[id number] DOB: 09/14/1950 DOA: 06/27/2015 PCP: Lillia Mountain, MD  Interim summary and HPI 65 y.o. female history of hypertension, diabetes mellitus, pancreatitis, hypothyroidism and dementia, brought to the emergency room by EMS and code stroke status following acute onset of feeling of weakness and numbness of breath as well as diaphoresis and slowed speech. Patient was also had nausea and vomiting. She reportedly had leftward gaze. There was no loss of consciousness. It's unclear whether she was actually confused. Blood sugar was 245. CT scan of her head showed no acute intracranial abnormality. Potassium was low at 2.6. BUN and creatinine was slightly elevated. Urinalysis is pending. Patient had no focal deficits on initial evaluation in the ED, including no eye movement abnormality. Speech was somewhat slowed but not dysarthric. NIH stroke score was 1 for not knowing her current age. Patient was noted to be diaphoretic and clammy. Code stroke was canceled. Patient was not administered IV t-PA. She was admitted for further evaluation and treatment.  Work up in process. Positive MRI for right cerebellar infarcts (multiples; raising concerns for emboli). Plan si for TEE and loop recording implantation on Monday 07/02/15.   Assessment/Plan: Dizziness and left gaze: due to right cerebellar infarcts. CT head is negative for acute intracranial abnormalities, but showed a small lacunar infarct.  -EEG unremarkable  -given abnormal MRI will complete stroke work up; including TEE and loop recorder base on findings -neurology on board and will follow rec's -secondary prevention with plavix -continue risk factors modifications   Nausea, vomiting: Etiology is not clear. Likely due to viral gastritis Vs associated with stroke. -had hx of chronic pancreatitis  -lipase WNL -continue IVF's -follow/replete electrolytes -PRN antiemetics  Depression  and anxiety: no suicidal or homicidal ideations. -Continue home medications: PRN Xanax and daily Remeron/Zoloft  Hypokalemia: K=2.5 on admission. -repleted and WNL now -will follow trend  -Mg WNL  HTN: -Holding lisinopril due to acute renal injury -Continue amlodipine -IV hydralazine PRN  HLD: Last LDL was 56 on 07/02/13 and remains at 56 on repeated lipid panel during this admission -will continue statins  DM-II: Last A1c 6.0 on 10/17/13, well controled. Patient is taking metformin at home; which is on hold due to AKI and inpatient status. -will continue SSI -A1c 6.4  Early onset Alzheimer's dementia without behavioral disturbance: -continue Donepezil  Tobacco abuse: -cessation counseling provided -Nicotine patch ordered   AKI: Likely due to prerenal secondary to dehydration and continuation of ACEI and NSAIDs -continue IVF as above; will start lowering rate. Patient eating and drinking better. -Follow up renal function trend -continue holding lisinopril and Mobic  Protein-calorie malnutrition, moderate (HCC): -Consultation to nutritional service in place -will follow rec's for feeding supplements    Code Status: Full Family Communication: no family at bedside Disposition Plan: complete work up for stroke, follow PT/OT recommendations for discharge; neurology on board, will follow rec's.   Consultants:  Neurology   Cardiology for TEE/ loop recorder implantation   Procedures:  MRI brain: with patchy right cerebellar infarcts   TEE: pending  Ct angio of head and neck: Poor flow in the Right SCA, No emergent large vessel occlusion, posterior circulation otherwise negative. Moderate Left ICA cavernous Stenosis. Emphysema and atelectasis in the lung apices.   Loop recorder implantation: depending on TEE  EEG: unremarkable   Antibiotics:  None   HPI/Subjective: Reports still feeling woozy and with some intermittent HA's. No dysarthria on  exam.  Objective: Filed Vitals:  06/29/15 1050 06/29/15 1400  BP: 134/85 153/95  Pulse: 62 58  Temp: 98.4 F (36.9 C) 98.4 F (36.9 C)  Resp: 17 17    Intake/Output Summary (Last 24 hours) at 06/29/15 1805 Last data filed at 06/29/15 1300  Gross per 24 hour  Intake   1207 ml  Output      0 ml  Net   1207 ml   There were no vitals filed for this visit.  Exam:   General: afebrile, denies CP and SOB. Feeling weak and dizzy. No further nausea or vomiting. Patient w/o dysarthria.  Cardiovascular: S1 and S2, no rubs or gallops  Respiratory: good air movement, no wheezing, no crackles  Abdomen: soft, NT, ND, positive BS  Musculoskeletal: no edema or cyanosis   Neuro: EOMI, grossly normal CN; MS 4//5 bilaterally due to poor effort. Mild dysmetria on finger to nose and slight diminish changes on fine finger movements on the right.  Data Reviewed: Basic Metabolic Panel:  Recent Labs Lab 06/27/15 2122 06/27/15 2134 06/28/15 0148 06/28/15 1813 06/29/15 0602  NA 137 143  --   --  136  K 2.5* 2.6*  --  4.5 4.8  CL 107 107  --   --  108  CO2 14*  --   --   --  24  GLUCOSE 237* 245*  --   --  105*  BUN 16 21*  --   --  12  CREATININE 1.26* 1.10*  --   --  1.00  CALCIUM 9.6  --   --   --  9.7  MG  --   --  2.1  --  1.6*   Liver Function Tests:  Recent Labs Lab 06/27/15 2122 06/28/15 0148  AST 32 30  ALT 19 21  ALKPHOS 78 77  BILITOT 0.7 0.4  PROT 8.0 7.4  ALBUMIN 3.7 3.5    Recent Labs Lab 06/28/15 0148  LIPASE 41   CBC:  Recent Labs Lab 06/27/15 2122 06/27/15 2134  WBC 9.1  --   NEUTROABS 4.4  --   HGB 13.1 14.6  HCT 39.7 43.0  MCV 84.3  --   PLT 183  --    Cardiac Enzymes:  Recent Labs Lab 06/28/15 0148 06/28/15 0524 06/28/15 1133  TROPONINI <0.03 <0.03 <0.03   CBG:  Recent Labs Lab 06/28/15 2241 06/29/15 0628 06/29/15 1144 06/29/15 1635 06/29/15 1718  GLUCAP 121* 105* 148* 88 108*   Studies: Ct Angio Head W/cm &/or Wo  Cm  06/28/2015  CLINICAL DATA:  65 year old female with acute right cerebellar infarcts, SCA territory, discovered during evaluation for acute dizziness. Initial encounter. EXAM: CT ANGIOGRAPHY HEAD AND NECK TECHNIQUE: Multidetector CT imaging of the head and neck was performed using the standard protocol during bolus administration of intravenous contrast. Multiplanar CT image reconstructions and MIPs were obtained to evaluate the vascular anatomy. Carotid stenosis measurements (when applicable) are obtained utilizing NASCET criteria, using the distal internal carotid diameter as the denominator. CONTRAST:  50 mL Isovue 370 COMPARISON:  Brain MRI 0320 hours today, and earlier. FINDINGS: CTA NECK Skeleton: Widespread cervical spine degeneration. No acute osseous abnormality identified. Visualized paranasal sinuses and mastoids are clear. Other neck: Centrilobular emphysema and dependent opacity in the lung apices, favor the latter due to atelectasis. No superior mediastinal lymphadenopathy. Negative thyroid, larynx, pharynx, parapharyngeal spaces, sublingual space, submandibular glands and parotid glands. Retropharyngeal spaces are normal aside from a retropharyngeal course of both carotids, especially the left. No cervical lymphadenopathy.  Aortic arch: Bovine type arch configuration. Tortuous proximal great vessels. Mild to moderate arch and proximal great vessel calcified atherosclerosis. Right carotid system: Tortuous brachiocephalic artery with a kinked appearance (coronal image 65). Normal right CCA origin. Partially retropharyngeal course of the right CCA. Negative right carotid bifurcation. Minimal calcified plaque right ICA bulb with no stenosis. Tortuous cervical right ICA with a kinked appearance at the C1 level (series 407, image 20). Left carotid system: No left CCA origin stenosis. Tortuous proximal left CCA. Retropharyngeal course of the left CCA and left ICA. Negative left carotid bifurcation. Mildly  tortuous cervical left ICA. Vertebral arteries:No proximal right subclavian artery stenosis. Normal right vertebral artery origin. Normal right vertebral artery to the skullbase. No proximal left subclavian artery stenosis. Mild calcified plaque near the left vertebral artery origin with no stenosis. Tortuous left V1 segment. Negative left vertebral artery otherwise to the skullbase. CTA HEAD Posterior circulation: Early origin of the left PICA which otherwise appears normal. The right PICA is diminutive or absent. No distal vertebral artery stenosis. Normal vertebrobasilar junction. Normal right AICA origin. No basilar stenosis. There appears to be a shared origin of the right PCA and right SCA. The right SCA is faintly enhancing on series 404, image 102 - but is then quickly attenuated. Normal left SCA. Normal bilateral PCA origins otherwise. Posterior communicating arteries are diminutive or absent. Normal PCA branches. Anterior circulation: Both ICA siphons are patent. There is mild to moderate calcified plaque primarily affecting the cavernous segments and greater on the left. There is moderate left cavernous segment stenosis as seen on series 404, image 120. No hemodynamically significant stenosis on the right. Normal ophthalmic artery origins. Normal carotid termini. Normal MCA and ACA origins. Mild irregularity and stenosis of the right ACA A1 segment. Normal left A1. Anterior communicating artery and bilateral ACA branches are within normal limits. Left MCA M1 segment, bifurcation, and left MCA branches are within normal limits. Right MCA M1 segment, bifurcation, and right MCA branches are within normal limits. Venous sinuses: Superior sagittal, transverse, and sigmoid sinuses are patent. Anatomic variants: Bovine type aortic arch configuration. IMPRESSION: 1. Positive for Poor flow in the Right SCA, concordant with the acute superior right cerebellar infarcts demonstrated this morning. 2. Negative for  emergent large vessel occlusion, and the posterior circulation is otherwise negative. 3. Anterior circulation remarkable for tortuous carotid arteries with left greater than right retropharyngeal course, and calcified ICA siphon atherosclerosis resulting in Moderate Left ICA cavernous segment Stenosis. 4. Emphysema and atelectasis in the lung apices. Electronically Signed   By: Odessa Fleming M.D.   On: 06/28/2015 13:49   Ct Head Wo Contrast  06/27/2015  CLINICAL DATA:  Code stroke. Left-sided gaze, headache and weakness. Initial encounter. EXAM: CT HEAD WITHOUT CONTRAST TECHNIQUE: Contiguous axial images were obtained from the base of the skull through the vertex without intravenous contrast. COMPARISON:  MRI of the brain performed 02/22/2014 FINDINGS: There is no evidence of acute infarction, mass lesion, or intra- or extra-axial hemorrhage on CT. Prominence of the ventricles and sulci reflects mild cortical volume loss. Mild periventricular white matter change likely reflects small vessel ischemic microangiopathy. A small chronic lacunar infarct is noted at the right cerebellar hemisphere. The brainstem and fourth ventricle are within normal limits. The basal ganglia are unremarkable in appearance. The cerebral hemispheres demonstrate grossly normal gray-white differentiation. No mass effect or midline shift is seen. There is no evidence of fracture; visualized osseous structures are unremarkable in appearance. The orbits are within  normal limits. The paranasal sinuses and mastoid air cells are well-aerated. No significant soft tissue abnormalities are seen. IMPRESSION: 1. No acute intracranial pathology seen on CT. 2. Mild cortical volume loss and scattered small vessel ischemic microangiopathy. 3. Small chronic lacunar infarct at the right cerebellar hemisphere. These results were called by telephone at the time of interpretation on 06/27/2015 at 9:37 pm to Dr. Roseanne Reno, who verbally acknowledged these results.  Electronically Signed   By: Roanna Raider M.D.   On: 06/27/2015 21:37   Ct Angio Neck W/cm &/or Wo/cm  06/28/2015  CLINICAL DATA:  65 year old female with acute right cerebellar infarcts, SCA territory, discovered during evaluation for acute dizziness. Initial encounter. EXAM: CT ANGIOGRAPHY HEAD AND NECK TECHNIQUE: Multidetector CT imaging of the head and neck was performed using the standard protocol during bolus administration of intravenous contrast. Multiplanar CT image reconstructions and MIPs were obtained to evaluate the vascular anatomy. Carotid stenosis measurements (when applicable) are obtained utilizing NASCET criteria, using the distal internal carotid diameter as the denominator. CONTRAST:  50 mL Isovue 370 COMPARISON:  Brain MRI 0320 hours today, and earlier. FINDINGS: CTA NECK Skeleton: Widespread cervical spine degeneration. No acute osseous abnormality identified. Visualized paranasal sinuses and mastoids are clear. Other neck: Centrilobular emphysema and dependent opacity in the lung apices, favor the latter due to atelectasis. No superior mediastinal lymphadenopathy. Negative thyroid, larynx, pharynx, parapharyngeal spaces, sublingual space, submandibular glands and parotid glands. Retropharyngeal spaces are normal aside from a retropharyngeal course of both carotids, especially the left. No cervical lymphadenopathy. Aortic arch: Bovine type arch configuration. Tortuous proximal great vessels. Mild to moderate arch and proximal great vessel calcified atherosclerosis. Right carotid system: Tortuous brachiocephalic artery with a kinked appearance (coronal image 65). Normal right CCA origin. Partially retropharyngeal course of the right CCA. Negative right carotid bifurcation. Minimal calcified plaque right ICA bulb with no stenosis. Tortuous cervical right ICA with a kinked appearance at the C1 level (series 407, image 20). Left carotid system: No left CCA origin stenosis. Tortuous proximal  left CCA. Retropharyngeal course of the left CCA and left ICA. Negative left carotid bifurcation. Mildly tortuous cervical left ICA. Vertebral arteries:No proximal right subclavian artery stenosis. Normal right vertebral artery origin. Normal right vertebral artery to the skullbase. No proximal left subclavian artery stenosis. Mild calcified plaque near the left vertebral artery origin with no stenosis. Tortuous left V1 segment. Negative left vertebral artery otherwise to the skullbase. CTA HEAD Posterior circulation: Early origin of the left PICA which otherwise appears normal. The right PICA is diminutive or absent. No distal vertebral artery stenosis. Normal vertebrobasilar junction. Normal right AICA origin. No basilar stenosis. There appears to be a shared origin of the right PCA and right SCA. The right SCA is faintly enhancing on series 404, image 102 - but is then quickly attenuated. Normal left SCA. Normal bilateral PCA origins otherwise. Posterior communicating arteries are diminutive or absent. Normal PCA branches. Anterior circulation: Both ICA siphons are patent. There is mild to moderate calcified plaque primarily affecting the cavernous segments and greater on the left. There is moderate left cavernous segment stenosis as seen on series 404, image 120. No hemodynamically significant stenosis on the right. Normal ophthalmic artery origins. Normal carotid termini. Normal MCA and ACA origins. Mild irregularity and stenosis of the right ACA A1 segment. Normal left A1. Anterior communicating artery and bilateral ACA branches are within normal limits. Left MCA M1 segment, bifurcation, and left MCA branches are within normal limits. Right MCA M1 segment,  bifurcation, and right MCA branches are within normal limits. Venous sinuses: Superior sagittal, transverse, and sigmoid sinuses are patent. Anatomic variants: Bovine type aortic arch configuration. IMPRESSION: 1. Positive for Poor flow in the Right SCA,  concordant with the acute superior right cerebellar infarcts demonstrated this morning. 2. Negative for emergent large vessel occlusion, and the posterior circulation is otherwise negative. 3. Anterior circulation remarkable for tortuous carotid arteries with left greater than right retropharyngeal course, and calcified ICA siphon atherosclerosis resulting in Moderate Left ICA cavernous segment Stenosis. 4. Emphysema and atelectasis in the lung apices. Electronically Signed   By: Odessa FlemingH  Hall M.D.   On: 06/28/2015 13:49   Mr Brain Wo Contrast  06/28/2015  CLINICAL DATA:  Initial valuation for acute dizziness. History of hypertension, hyperlipidemia, diabetes. EXAM: MRI HEAD WITHOUT CONTRAST TECHNIQUE: Multiplanar, multiecho pulse sequences of the brain and surrounding structures were obtained without intravenous contrast. COMPARISON:  Prior CT from 06/27/2015. FINDINGS: Mildly advanced cerebral atrophy for patient age. Patchy and confluent T2/FLAIR hyperintensity within the periventricular white matter most like related chronic small vessel ischemic disease. Chronic small vessel ischemic changes present within the pons as well. This is fairly mild nature. Remote lacunar infarct within the left caudate body. Probable additional small remote cortical infarct within the left occipital region (series 7, image 11). Probable additional small remote lacunar infarcts within the bilateral cerebellar hemispheres. Patchy restricted diffusion present within the superior aspect of the right cerebellar hemisphere near the middle cerebellar peduncle (series 4, image 14). This is positioned adjacent to the fourth ventricle with involvement of the vermis. No associated mass effect or hemorrhage. No other acute infarct. Gray-white matter differentiation otherwise maintained. Major intracranial vascular flow voids are preserved. No acute or chronic intracranial hemorrhage. No mass lesion, midline shift, or mass effect. No hydrocephalus.  No extra-axial fluid collection. Major dural sinuses are grossly patent. Craniocervical junction within normal limits. Mild multilevel degenerative spondylolysis noted within the visualized upper cervical spine without significant stenosis. Incidental note made of an empty sella. No acute abnormality about the orbits. Mild mucosal thickening within the paranasal sinuses. No air-fluid levels to suggest active sinus infection. No mastoid effusion. Inner ear structures grossly normal. Bone marrow signal intensity within normal limits. No scalp soft tissue abnormality. IMPRESSION: 1. Small patchy acute right cerebellar infarcts as above. No associated hemorrhage or mass effect. 2. Remote lacunar infarcts involving the left caudate and bilateral cerebellar hemispheres, with additional small remote cortical infarct within the left occipital lobe. 3. Mild atrophy with chronic small vessel ischemic disease. Electronically Signed   By: Rise MuBenjamin  McClintock M.D.   On: 06/28/2015 04:59    Scheduled Meds: .  stroke: mapping our early stages of recovery book   Does not apply Once  . ALPRAZolam  0.5 mg Oral Daily  . amLODipine  5 mg Oral Daily  . aspirin EC  81 mg Oral Daily  . atorvastatin  40 mg Oral Daily  . calcium carbonate  1,250 mg Oral Q breakfast  . clopidogrel  75 mg Oral Daily  . donepezil  10 mg Oral QHS  . enoxaparin (LOVENOX) injection  40 mg Subcutaneous Daily  . feeding supplement (GLUCERNA SHAKE)  237 mL Oral TID BM  . insulin aspart  0-5 Units Subcutaneous QHS  . insulin aspart  0-9 Units Subcutaneous TID WC  . levothyroxine  50 mcg Oral QAC breakfast  . multivitamin with minerals  1 tablet Oral Daily  . nicotine  21 mg Transdermal Daily  .  sertraline  100 mg Oral BID   Continuous Infusions: . sodium chloride 125 mL/hr at 06/29/15 2956    Principal Problem:   Dizziness Active Problems:   Major depressive disorder (HCC)   Hypertension   Chronic back pain   Hyperlipidemia   Diabetes  mellitus, type 2 (HCC)   Early onset Alzheimer's dementia without behavioral disturbance   Tobacco abuse   Anxiety   Hypokalemia   AKI (acute kidney injury) (HCC)   Protein-calorie malnutrition, moderate (HCC)   Nausea & vomiting   Acute CVA (cerebrovascular accident) (HCC)   Cerebellar stroke (HCC)   Emesis    Time spent: 35 minutes    Vassie Loll  [facility] Pager [phone]. If 7PM-7AM, please contact night-coverage at [email or web address], password [facility] 06/29/2015, 6:05 PM  LOS: 1 day

## 2015-06-29 NOTE — Evaluation (Signed)
Occupational Therapy Evaluation Patient Details Name: [name] MRN: [id number] DOB: 1950/07/19 Today's Date: 06/29/2015    History of Present Illness pt was admitted for dizziness, L gaze, slow speech, diaphoresis, and nausea vomiting. CT revealed small lacunar infact R cerebellum.  PMH significant for HTN, DM and early on set Alzheimer's   Clinical Impression   This 65 year old female was admitted for the above.  Per chart and pt, she was independent with adls prior to this admission. Pt describes her dizziness as dysequilibrium and she needs min A because of this for SPT as well as ADLs.  Did not ambulate in room as pt had ataxic movements and would like +2 assistance for lines. Goals in acute are for min guard level goals.      Follow Up Recommendations  SNF    Equipment Recommendations  3 in 1 bedside comode    Recommendations for Other Services       Precautions / Restrictions Precautions Precautions: Fall Restrictions Weight Bearing Restrictions: No      Mobility Bed Mobility               General bed mobility comments: OOB  Transfers Overall transfer level: Needs assistance Equipment used: Rolling walker (2 wheeled)   Sit to Stand: Min assist Stand pivot transfers: Min assist       General transfer comment: assist to rise and steady.  Cues for safety, keeping body between walker.  Min A when stepping--movements ataxic    Balance Overall balance assessment: Needs assistance   Sitting balance-Leahy Scale: Good       Standing balance-Leahy Scale: Poor                              ADL Overall ADL's : Needs assistance/impaired     Grooming: Wash/dry face;Sitting;Set up   Upper Body Bathing: Supervision/ safety;Sitting   Lower Body Bathing: Minimal assistance;Sit to/from stand   Upper Body Dressing : Minimal assistance;Sitting   Lower Body Dressing: Minimal assistance;Sit to/from stand   Toilet Transfer: Minimal  assistance;Stand-pivot;BSC;RW   Toileting- Clothing Manipulation and Hygiene: Minimal assistance;Sit to/from stand;Sitting/lateral lean         General ADL Comments: pt called for assistance as she had an incontinence episode.  She states that she wears depends garments at home.  Pt needs overall min A for balance during adls.  She also needed cues to continue washing body part as she would bring washcloth to face and repeatedly wash this.  Pt able to don socks but she did knock drink over on tray     Vision     Perception     Praxis Praxis Praxis-Other Comments: WFLs during ADL tasks    Pertinent Vitals/Pain       Hand Dominance Right   Extremity/Trunk Assessment Upper Extremity Assessment Upper Extremity Assessment: Overall WFL for tasks assessed           Communication Communication Communication: No difficulties   Cognition Arousal/Alertness: Awake/alert Behavior During Therapy: WFL for tasks assessed/performed Overall Cognitive Status: No family/caregiver present to determine baseline cognitive functioning (decreased memory at baseline)                     General Comments       Exercises       Shoulder Instructions      Home Living Family/patient expects to be discharged to:: Unsure  Additional Comments: pt states she lives with her boyfriend in 2nd floor apt with tub and normal commode. She did not use AD prior to admission  Pt states boyfriend is not in very good health--unable to verify      Prior Functioning/Environment Level of Independence: Independent             OT Diagnosis: Generalized weakness;Cognitive deficits   OT Problem List: Decreased strength;Decreased activity tolerance;Impaired balance (sitting and/or standing);Decreased cognition;Decreased safety awareness   OT Treatment/Interventions: Self-care/ADL training;DME and/or AE instruction;Balance training;Patient/family  education;Cognitive remediation/compensation;Therapeutic activities    OT Goals(Current goals can be found in the care plan section) Acute Rehab OT Goals Patient Stated Goal: none stated; agreeable to OT OT Goal Formulation: With patient Time For Goal Achievement: 07/13/15 Potential to Achieve Goals: Good  OT Frequency: Min 3X/week   Barriers to D/C:            Co-evaluation              End of Session Nurse Communication:  (incontinence)  Activity Tolerance: Patient tolerated treatment well Patient left: in chair;with call bell/phone within reach;with chair alarm set   Time: 1914-7829 OT Time Calculation (min): 18 min Charges:  OT General Charges $OT Visit: 1 Procedure OT Evaluation $OT Eval Moderate Complexity: 1 Procedure G-Codes:    [name] 13-Jul-2015, 11:07 AM   Marica Otter, OTR/L [phone] 07/13/15

## 2015-06-29 NOTE — Evaluation (Signed)
Speech Language Pathology Evaluation Patient Details Name: MARIEN MANSHIP MRN: [id number] DOB: [birth year] Today's Date: 06/29/2015 Time: 4098-1191 SLP Time Calculation (min) (ACUTE ONLY): 14 min  Problem List:  Patient Active Problem List   Diagnosis Date Noted  . AKI (acute kidney injury) (HCC) 06/28/2015  . Protein-calorie malnutrition, moderate (HCC) 06/28/2015  . Nausea & vomiting 06/28/2015  . Acute CVA (cerebrovascular accident) (HCC) 06/28/2015  . Hypokalemia   . Cerebellar stroke (HCC)   . Emesis   . Anxiety 06/27/2015  . Dizziness 06/27/2015  . Tobacco abuse   . Early onset Alzheimer's dementia without behavioral disturbance 11/03/2014  . Memory deficits 07/03/2014  . Trigger finger, acquired 09/13/2012  . Pain in both wrists 05/12/2012  . Non-proliferative diabetic retinopathy, both eyes (HCC) 05/07/2012  . Colon cancer screening 12/23/2011  . Unintentional weight loss 12/22/2011  . Memory problem 10/31/2011  . Idiopathic acute pancreatitis 10/29/2011  . Major depressive disorder (HCC) 09/10/2011  . Hypertension 09/10/2011  . Chronic back pain 09/10/2011  . Hyperlipidemia 09/10/2011  . Diabetes mellitus, type 2 (HCC) 09/10/2011   Past Medical History:  Past Medical History  Diagnosis Date  . Hypertension   . Diabetes mellitus   . Pancreatitis 2010  . Idiopathic acute pancreatitis 09/16/2011  . Trigger finger   . Hypothyroidism   . Dementia   . Memory loss   . Tobacco abuse    Past Surgical History:  Past Surgical History  Procedure Laterality Date  . Cholecystectomy  2003  . Spine surgery  2006    Dr. Trey Sailors - L3-L4, L4-L5 laminotomy and foraminotomy  . Back surgery    . Cyst removal hand Left 12/11/2014    Procedure: LEFT RING FINGER TENDON SHEATH CYST EXCISION;  Surgeon: Mack Hook, MD;  Location: [facility];  Service: Orthopedics;  Laterality: Left;   HPI:  pt was admitted for dizziness, L gaze, slow speech, diaphoresis, and  nausea vomiting. CT revealed small lacunar infact R cerebellum. PMH significant for HTN, DM and early on set Alzheimer's   Assessment / Plan / Recommendation Clinical Impression  Pt demonstrates baseline cognitive deficits associated with early onset of dementia. Pt observed to struggle with short term memory and awareness of errors. Her speech and language are otherwise in tact and this CVA does not appear to have impacted cognitive function. There is a concern for safety with ADL's given cognitive deficits, though pt reports she is independent with finances and medication and can  demonstrate basic problem solving ability. Recommend f/u with SLP at next level of care CIR vs SNF based on PT/OT recs to address memory impairment and safety with ADLs. No further acute needs at this time, will sign off.     SLP Assessment  All further Speech Lanaguage Pathology  needs can be addressed in the next venue of care    Follow Up Recommendations  Inpatient Rehab;Skilled Nursing facility    Frequency and Duration           SLP Evaluation Prior Functioning  Cognitive/Linguistic Baseline: Baseline deficits Baseline deficit details: Early onset of Alzheimer's Dementia, working memory, short term memory deficits Type of Home: Apartment  Lives With:  Nurse, mental health) Available Help at Discharge: Friend(s) (has a roommate and a best friend but he is ill. ) Education: Doctor, hospital Vocation: Retired   IT consultant  Overall Cognitive Status: History of cognitive impairments - at baseline Arousal/Alertness: Awake/alert Orientation Level: Oriented X4 Attention: Focused;Sustained;Selective;Alternating Focused Attention: Appears intact Sustained Attention: Appears intact Selective  Attention: Appears intact Alternating Attention: Impaired Alternating Attention Impairment: Verbal complex Memory: Impaired Memory Impairment: Retrieval deficit;Decreased recall of new information;Decreased short term memory Decreased  Short Term Memory: Verbal basic Awareness: Impaired Awareness Impairment: Emergent impairment Problem Solving: Appears intact    Comprehension  Auditory Comprehension Overall Auditory Comprehension: Appears within functional limits for tasks assessed    Expression Verbal Expression Overall Verbal Expression: Appears within functional limits for tasks assessed Written Expression Dominant Hand: Right   Oral / Motor  Oral Motor/Sensory Function Overall Oral Motor/Sensory Function: Within functional limits Motor Speech Overall Motor Speech: Appears within functional limits for tasks assessed   GO                   Harlon DittyBonnie Keyauna Graefe, MA CCC-SLP [phone]  Claudine MoutonDeBlois, Deyna Carbon Caroline 06/29/2015, 12:11 PM

## 2015-06-29 NOTE — Progress Notes (Signed)
    [facility] has been requested to perform a transesophageal echocardiogram for stroke  After careful review of history and examination, the risks and benefits of transesophageal echocardiogram have been explained including risks of esophageal damage, perforation (1:10,000 risk), bleeding, pharyngeal hematoma as well as other potential complications associated with conscious sedation including aspiration, arrhythmia, respiratory failure and death. Patient is willing to proceed. She says she does not have any next of kin to discuss further with (only friends). Despite diagnosis of dementia patient was fully conversant and engaged in the conversation, asking appropriate questions, & reviewing the course of her hospitalization. I d/w Dr. Tenny Crawoss who feels OK to proceed Monday. Alternatives to treatment were discussed, questions were answered. This is scheduled for Dr. Shirlee LatchMcLean on Monday at 11am.  Laurann Montanaayna N Dunn, PA-C 06/29/2015 3:24 PM

## 2015-06-29 NOTE — Progress Notes (Signed)
After patient had accident, she stated that she wears depends all the time at home.

## 2015-06-29 NOTE — Procedures (Signed)
History: [name] is an 65 y.o. female patient with dizziness. Routine inpatient EEG was performed for further evaluation.   Patient Active Problem List   Diagnosis Date Noted  . AKI (acute kidney injury) (HCC) 06/28/2015  . Protein-calorie malnutrition, moderate (HCC) 06/28/2015  . Nausea & vomiting 06/28/2015  . Acute CVA (cerebrovascular accident) (HCC) 06/28/2015  . Hypokalemia   . Cerebellar stroke (HCC)   . Emesis   . Anxiety 06/27/2015  . Dizziness 06/27/2015  . Tobacco abuse   . Early onset Alzheimer's dementia without behavioral disturbance 11/03/2014  . Memory deficits 07/03/2014  . Trigger finger, acquired 09/13/2012  . Pain in both wrists 05/12/2012  . Non-proliferative diabetic retinopathy, both eyes (HCC) 05/07/2012  . Colon cancer screening 12/23/2011  . Unintentional weight loss 12/22/2011  . Memory problem 10/31/2011  . Idiopathic acute pancreatitis 10/29/2011  . Major depressive disorder (HCC) 09/10/2011  . Hypertension 09/10/2011  . Chronic back pain 09/10/2011  . Hyperlipidemia 09/10/2011  . Diabetes mellitus, type 2 (HCC) 09/10/2011     Current facility-administered medications:  .   stroke: mapping our early stages of recovery book, , Does not apply, Once, Lorretta HarpXilin Niu, MD .  0.9 %  sodium chloride infusion, , Intravenous, Continuous, Lorretta HarpXilin Niu, MD, Last Rate: 125 mL/hr at 06/29/15 [id number] .  acetaminophen (TYLENOL) tablet 325 mg, 325 mg, Oral, Q6H PRN, Lorretta HarpXilin Niu, MD .  ALPRAZolam Prudy Feeler(XANAX) tablet 0.5 mg, 0.5 mg, Oral, Daily, Lorretta HarpXilin Niu, MD, 0.5 mg at 06/28/15 1000 .  amLODipine (NORVASC) tablet 5 mg, 5 mg, Oral, Daily, Lorretta HarpXilin Niu, MD, 5 mg at 06/28/15 1018 .  aspirin EC tablet 81 mg, 81 mg, Oral, Daily, Lorretta HarpXilin Niu, MD, 81 mg at 06/28/15 1018 .  atorvastatin (LIPITOR) tablet 40 mg, 40 mg, Oral, Daily, Lorretta HarpXilin Niu, MD, 40 mg at 06/28/15 1018 .  calcium carbonate (OS-CAL - dosed in mg of elemental calcium) tablet 1,250 mg, 1,250 mg, Oral, Q breakfast, Lorretta HarpXilin Niu,  MD, 1,250 mg at 06/28/15 1018 .  clopidogrel (PLAVIX) tablet 75 mg, 75 mg, Oral, Daily, Micki RileyPramod S Sethi, MD, 75 mg at 06/28/15 1737 .  donepezil (ARICEPT) tablet 10 mg, 10 mg, Oral, QHS, Lorretta HarpXilin Niu, MD, 10 mg at 06/28/15 2355 .  enoxaparin (LOVENOX) injection 40 mg, 40 mg, Subcutaneous, Daily, Lorretta HarpXilin Niu, MD, 40 mg at 06/28/15 1017 .  feeding supplement (GLUCERNA SHAKE) (GLUCERNA SHAKE) liquid 237 mL, 237 mL, Oral, TID BM, Vassie Lollarlos Madera, MD, 237 mL at 06/28/15 2047 .  hydrALAZINE (APRESOLINE) injection 5 mg, 5 mg, Intravenous, Q2H PRN, Lorretta HarpXilin Niu, MD .  HYDROcodone-acetaminophen (NORCO/VICODIN) 5-325 MG per tablet 1 tablet, 1 tablet, Oral, Q4H PRN, Lorretta HarpXilin Niu, MD .  insulin aspart (novoLOG) injection 0-5 Units, 0-5 Units, Subcutaneous, QHS, Lorretta HarpXilin Niu, MD .  insulin aspart (novoLOG) injection 0-9 Units, 0-9 Units, Subcutaneous, TID WC, Lorretta HarpXilin Niu, MD, 0 Units at 06/28/15 0800 .  levothyroxine (SYNTHROID, LEVOTHROID) tablet 50 mcg, 50 mcg, Oral, QAC breakfast, Lorretta HarpXilin Niu, MD, 50 mcg at 06/28/15 1018 .  meclizine (ANTIVERT) tablet 12.5 mg, 12.5 mg, Oral, TID PRN, Micki RileyPramod S Sethi, MD, 12.5 mg at 06/28/15 1503 .  mirtazapine (REMERON) tablet 15 mg, 15 mg, Oral, Daily PRN, Lorretta HarpXilin Niu, MD .  multivitamin with minerals tablet 1 tablet, 1 tablet, Oral, Daily, Lorretta HarpXilin Niu, MD, 1 tablet at 06/28/15 1018 .  nicotine (NICODERM CQ - dosed in mg/24 hours) patch 21 mg, 21 mg, Transdermal, Daily, Lorretta HarpXilin Niu, MD, 21 mg at 06/28/15 1018 .  ondansetron (  ZOFRAN) injection 4 mg, 4 mg, Intravenous, Q8H PRN, Lorretta Harp, MD .  senna-docusate (Senokot-S) tablet 1 tablet, 1 tablet, Oral, QHS PRN, Lorretta Harp, MD .  sertraline (ZOLOFT) tablet 100 mg, 100 mg, Oral, BID, Lorretta Harp, MD, 100 mg at 06/28/15 2355   Introduction:  This is a 19 channel routine scalp EEG performed at the bedside with bipolar and monopolar montages arranged in accordance to the international 10/20 system of electrode placement. One channel was dedicated to EKG  recording.   Findings:  The background rhythm was normal 8.5-9 Hz alpha . No definite evidence of abnormal epileptiform discharges or electrographic seizures were noted during this recording.   Impression:  Unremarkable awake and drowsy routine inpatient EEG. Clinical correlation is recommended .

## 2015-06-29 NOTE — Progress Notes (Signed)
RN attempted to consent TEE with patient but she verbalizes that she doesn't remember any doctor explaining to her about the procedure. RN already given information of TEE earlier. Will hold consent at this time until tomorrow.   Sim BoastHavy, RN

## 2015-06-29 NOTE — Evaluation (Signed)
Physical Therapy Evaluation Patient Details Name: [name] Platas MRN: [id number] DOB: [birth year] Today's Date: 06/29/2015   History of Present Illness  Pt is a 65 year old female admitted for dizziness, L gaze, slow speech, diaphoresis, and nausea vomiting. 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 Pt currently with functional limitations due to the deficits listed below (see PT Problem List).  Pt will benefit from skilled PT to increase their independence and safety with mobility to allow discharge to the venue listed below.   Pt presents with balance deficits and reports she lives on second level of condo.  She states she plans to d/c home however uncertain her friends would be able to provide assist so would recommend SNF at this time.     Follow Up Recommendations SNF;Supervision/Assistance - 24 hour    Equipment Recommendations  Rolling walker with 5" wheels    Recommendations for Other Services       Precautions / Restrictions Precautions Precautions: Fall      Mobility  Bed Mobility Overal bed mobility: Needs Assistance Bed Mobility: Supine to Sit;Sit to Supine     Supine to sit: Supervision Sit to supine: Supervision   General bed mobility comments: increased time and effort, pt self assisted R LE onto bed  Transfers Overall transfer level: Needs assistance Equipment used: Rolling walker (2 wheeled) Transfers: Sit to/from Stand Sit to Stand: Min assist Stand pivot transfers: Min assist       General transfer comment: assist to rise and steady.  verbal cues for safe technique  Ambulation/Gait Ambulation/Gait assistance: Min assist;Min guard Ambulation Distance (Feet): 160 Feet Assistive device: Rolling walker (2 wheeled) Gait Pattern/deviations: Step-through pattern;Wide base of  support;Decreased stride length Gait velocity: decr   General Gait Details: pt requires UE support for balance, attempted a few steps without RW and very ataxic gait requiring min assist for balance  Stairs            Wheelchair Mobility    Modified Rankin (Stroke Patients Only) Modified Rankin (Stroke Patients Only) Pre-Morbid Rankin Score: No symptoms Modified Rankin: Moderately severe disability     Balance Overall balance assessment: Needs assistance         Standing balance support: Bilateral upper extremity supported;During functional activity Standing balance-Leahy Scale: Poor Standing balance comment: requires UE support                             Pertinent Vitals/Pain Pain Assessment: 0-10 Pain Score: 6  Pain Location: headache Pain Descriptors / Indicators: Headache Pain Intervention(s): Limited activity within patient's tolerance;Monitored during session;Patient requesting pain meds-RN notified    Home Living Family/patient expects to be discharged to:: Private residence   Available Help at Discharge: Friend(s) Type of Home:  (condo) Home Access: Stairs to enter Entrance Stairs-Rails: Right Entrance Stairs-Number of Steps: flight Home Layout: One level Home Equipment: None Additional Comments: pt reports she lives with her ex boyfriend in a condo, also has a friend in same building    Prior Function Level of Independence: Independent               Hand Dominance        Extremity/Trunk Assessment               Lower Extremity Assessment: Generalized weakness;RLE  deficits/detail;LLE deficits/detail         Communication   Communication: No difficulties  Cognition Arousal/Alertness: Awake/alert Behavior During Therapy: WFL for tasks assessed/performed Overall Cognitive Status: History of cognitive impairments - at baseline                      General Comments      Exercises         Assessment/Plan    PT Assessment Patient needs continued PT services  PT Diagnosis Abnormality of gait   PT Problem List Decreased strength;Decreased mobility;Decreased balance;Decreased activity tolerance;Decreased knowledge of use of DME;Decreased coordination  PT Treatment Interventions DME instruction;Gait training;Functional mobility training;Patient/family education;Therapeutic activities;Therapeutic exercise;Stair training;Balance training;Neuromuscular re-education   PT Goals (Current goals can be found in the Care Plan section) Acute Rehab PT Goals PT Goal Formulation: With patient Time For Goal Achievement: 07/13/15 Potential to Achieve Goals: Good    Frequency Min 4X/week   Barriers to discharge Decreased caregiver support pt states friends can check in but reports her friend that lives in same building was currently a pt at [facility]    Co-evaluation               End of Session Equipment Utilized During Treatment: Gait belt Activity Tolerance: Patient tolerated treatment well Patient left: in bed;with call bell/phone within reach;with bed alarm set           Time: 4098-1191 PT Time Calculation (min) (ACUTE ONLY): 23 min   Charges:   PT Evaluation $PT Eval Moderate Complexity: 1 Procedure     PT G Codes:        Esthefany Herrig,KATHrine E 06/29/2015, 3:12 PM Zenovia Jarred, PT, DPT 06/29/2015 Pager: [phone]

## 2015-06-30 ENCOUNTER — Other Ambulatory Visit (HOSPITAL_COMMUNITY): Payer: Medicare Other

## 2015-06-30 LAB — GLUCOSE, CAPILLARY
GLUCOSE-CAPILLARY: 91 mg/dL (ref 65–99)
Glucose-Capillary: 101 mg/dL — ABNORMAL HIGH (ref 65–99)
Glucose-Capillary: 100 mg/dL — ABNORMAL HIGH (ref 65–99)
Glucose-Capillary: 133 mg/dL — ABNORMAL HIGH (ref 65–99)

## 2015-06-30 NOTE — Progress Notes (Signed)
[facility] PROGRESS NOTE  [name] ZOX:[id number] DOB: [birth year] DOA: 06/27/2015 PCP: Lillia Mountain, MD  Interim summary and HPI 65 y.o. female history of hypertension, diabetes mellitus, pancreatitis, hypothyroidism and dementia, brought to the emergency room by EMS and code stroke status following acute onset of feeling of weakness and numbness of breath as well as diaphoresis and slowed speech. Patient was also had nausea and vomiting. She reportedly had leftward gaze. There was no loss of consciousness. It's unclear whether she was actually confused. Blood sugar was 245. CT scan of her head showed no acute intracranial abnormality. Potassium was low at 2.6. BUN and creatinine was slightly elevated. Urinalysis is pending. Patient had no focal deficits on initial evaluation in the ED, including no eye movement abnormality. Speech was somewhat slowed but not dysarthric. NIH stroke score was 1 for not knowing her current age. Patient was noted to be diaphoretic and clammy. Code stroke was canceled. Patient was not administered IV t-PA. She was admitted for further evaluation and treatment.  Work up in process. Positive MRI for right cerebellar infarcts (multiples; raising concerns for emboli). Plan si for TEE and loop recording implantation on Monday 07/02/15.   Assessment/Plan: Dizziness and left gaze: due to right cerebellar infarcts. CT head is negative for acute intracranial abnormalities, but showed a small lacunar infarct.  -EEG unremarkable  -given abnormal MRI will complete stroke work up; including TEE and loop recorder base on findings -neurology on board and will follow rec's -secondary prevention with plavix -continue risk factors modifications   Nausea, vomiting: Etiology is not clear. Likely due to viral gastritis Vs associated with stroke. -had hx of chronic pancreatitis  -lipase WNL -continue IVF's -follow/replete electrolytes -PRN antiemetics  Depression  and anxiety: no suicidal or homicidal ideations. -Continue home medications: PRN Xanax and daily Remeron/Zoloft  Hypokalemia: K=2.5 on admission. -repleted and WNL now -will follow trend  -Mg WNL  HTN: -Holding lisinopril due to acute renal injury -Continue amlodipine -IV hydralazine PRN  HLD: Last LDL was 56 on 07/02/13 and remains at 56 on repeated lipid panel during this admission -will continue statins  DM-II: Last A1c 6.0 on 10/17/13, well controled. Patient is taking metformin at home; which is on hold due to AKI and inpatient status. -will continue SSI -A1c 6.4  Early onset Alzheimer's dementia without behavioral disturbance: -continue Donepezil  Tobacco abuse: -cessation counseling provided -Nicotine patch ordered   AKI: Likely due to prerenal secondary to dehydration and continuation of ACEI and NSAIDs -continue IVF as above; will start lowering rate. Patient eating and drinking better. -Follow up renal function trend -continue holding lisinopril and Mobic  Protein-calorie malnutrition, moderate (HCC): -Consultation to nutritional service in place -will follow rec's for feeding supplements    Code Status: Full Family Communication: no family at bedside Disposition Plan: complete work up for stroke, follow PT/OT recommendations for discharge; neurology on board, will follow rec's.   Consultants:  Neurology   Cardiology for TEE/ loop recorder implantation   Procedures:  MRI brain: with patchy right cerebellar infarcts   TEE: pending  Ct angio of head and neck: Poor flow in the Right SCA, No emergent large vessel occlusion, posterior circulation otherwise negative. Moderate Left ICA cavernous Stenosis. Emphysema and atelectasis in the lung apices.   Loop recorder implantation: depending on TEE  EEG: unremarkable   Antibiotics:  None   HPI/Subjective: Reports some intermittent woozy feeling. No dysarthria on exam and denies HA's  currently.  Objective: Filed Vitals:  06/30/15 0900 06/30/15 1356  BP: 128/92 134/80  Pulse: 75 70  Temp: 98.2 F (36.8 C) 98.1 F (36.7 C)  Resp: 18 18    Intake/Output Summary (Last 24 hours) at 06/30/15 1717 Last data filed at 06/29/15 1730  Gross per 24 hour  Intake    360 ml  Output      0 ml  Net    360 ml   There were no vitals filed for this visit.  Exam:   General: afebrile, denies CP and SOB. No further nausea or vomiting. Patient w/o dysarthria or focal motor deficit. Reports some slight dizziness intermittently .  Cardiovascular: S1 and S2, no rubs or gallops  Respiratory: good air movement, no wheezing, no crackles  Abdomen: soft, NT, ND, positive BS  Musculoskeletal: no edema or cyanosis   Neuro: EOMI, grossly normal CN; MS 4//5 bilaterally due to poor effort. Mild dysmetria on finger to nose and slight diminish changes on fine finger movements on the right.  Data Reviewed: Basic Metabolic Panel:  Recent Labs Lab 06/27/15 2122 06/27/15 2134 06/28/15 0148 06/28/15 1813 06/29/15 0602  NA 137 143  --   --  136  K 2.5* 2.6*  --  4.5 4.8  CL 107 107  --   --  108  CO2 14*  --   --   --  24  GLUCOSE 237* 245*  --   --  105*  BUN 16 21*  --   --  12  CREATININE 1.26* 1.10*  --   --  1.00  CALCIUM 9.6  --   --   --  9.7  MG  --   --  2.1  --  1.6*   Liver Function Tests:  Recent Labs Lab 06/27/15 2122 06/28/15 0148  AST 32 30  ALT 19 21  ALKPHOS 78 77  BILITOT 0.7 0.4  PROT 8.0 7.4  ALBUMIN 3.7 3.5    Recent Labs Lab 06/28/15 0148  LIPASE 41   CBC:  Recent Labs Lab 06/27/15 2122 06/27/15 2134  WBC 9.1  --   NEUTROABS 4.4  --   HGB 13.1 14.6  HCT 39.7 43.0  MCV 84.3  --   PLT 183  --    Cardiac Enzymes:  Recent Labs Lab 06/28/15 0148 06/28/15 0524 06/28/15 1133  TROPONINI <0.03 <0.03 <0.03   CBG:  Recent Labs Lab 06/29/15 1718 06/29/15 2123 06/30/15 0608 06/30/15 1119 06/30/15 1615  GLUCAP 108* 120* 101*  91 100*   Studies: No results found.  Scheduled Meds: .  stroke: mapping our early stages of recovery book   Does not apply Once  . ALPRAZolam  0.5 mg Oral Daily  . amLODipine  5 mg Oral Daily  . aspirin EC  81 mg Oral Daily  . atorvastatin  40 mg Oral Daily  . calcium carbonate  1,250 mg Oral Q breakfast  . clopidogrel  75 mg Oral Daily  . donepezil  10 mg Oral QHS  . enoxaparin (LOVENOX) injection  40 mg Subcutaneous Daily  . feeding supplement (GLUCERNA SHAKE)  237 mL Oral TID BM  . insulin aspart  0-5 Units Subcutaneous QHS  . insulin aspart  0-9 Units Subcutaneous TID WC  . levothyroxine  50 mcg Oral QAC breakfast  . multivitamin with minerals  1 tablet Oral Daily  . nicotine  21 mg Transdermal Daily  . sertraline  100 mg Oral BID   Continuous Infusions: . sodium chloride 125 mL/hr at 06/29/15  [id number]    Principal Problem:   Dizziness Active Problems:   Major depressive disorder (HCC)   Hypertension   Chronic back pain   Hyperlipidemia   Diabetes mellitus, type 2 (HCC)   Early onset Alzheimer's dementia without behavioral disturbance   Tobacco abuse   Anxiety   Hypokalemia   AKI (acute kidney injury) (HCC)   Protein-calorie malnutrition, moderate (HCC)   Nausea & vomiting   Acute CVA (cerebrovascular accident) (HCC)   Cerebellar stroke (HCC)   Emesis    Time spent: 35 minutes    Vassie LollMadera, Nilaya Bouie  [facility] Pager [phone]. If 7PM-7AM, please contact night-coverage at [email or web address], password [email or web address] 06/30/2015, 5:17 PM  LOS: 2 days

## 2015-06-30 NOTE — Progress Notes (Signed)
STROKE TEAM PROGRESS NOTE   SUBJECTIVE (INTERVAL HISTORY) No new complaints. TEE and loop planned for Monday.   OBJECTIVE Temp:  [98.2 F (36.8 C)-99.2 F (37.3 C)] 98.2 F (36.8 C) (06/03 0900) Pulse Rate:  [58-79] 75 (06/03 0900) Cardiac Rhythm:  [-] Normal sinus rhythm (06/03 0734) Resp:  [17-18] 18 (06/03 0900) BP: (128-155)/(77-95) 128/92 mmHg (06/03 0900) SpO2:  [93 %-100 %] 100 % (06/03 0900)  CBC:   Recent Labs Lab 06/27/15 2122 06/27/15 2134  WBC 9.1  --   NEUTROABS 4.4  --   HGB 13.1 14.6  HCT 39.7 43.0  MCV 84.3  --   PLT 183  --    Basic Metabolic Panel:   Recent Labs Lab 06/27/15 2122 06/27/15 2134 06/28/15 0148 06/28/15 1813 06/29/15 0602  NA 137 143  --   --  136  K 2.5* 2.6*  --  4.5 4.8  CL 107 107  --   --  108  CO2 14*  --   --   --  24  GLUCOSE 237* 245*  --   --  105*  BUN 16 21*  --   --  12  CREATININE 1.26* 1.10*  --   --  1.00  CALCIUM 9.6  --   --   --  9.7  MG  --   --  2.1  --  1.6*   Lipid Panel:     Component Value Date/Time   CHOL 109 06/28/2015 0148   TRIG 69 06/28/2015 0148   HDL 39* 06/28/2015 0148   CHOLHDL 2.8 06/28/2015 0148   VLDL 14 06/28/2015 0148   LDLCALC 56 06/28/2015 0148   HgbA1c:  Lab Results  Component Value Date   HGBA1C 6.4* 06/28/2015   Urine Drug Screen:     Component Value Date/Time   LABOPIA NONE DETECTED 06/28/2015 0006   COCAINSCRNUR NONE DETECTED 06/28/2015 0006   LABBENZ NONE DETECTED 06/28/2015 0006   AMPHETMU NONE DETECTED 06/28/2015 0006   THCU NONE DETECTED 06/28/2015 0006   LABBARB NONE DETECTED 06/28/2015 0006      IMAGING  CT angiogram of the head and neck 06/28/2015 1. Positive for Poor flow in the Right SCA, concordant with the acute superior right cerebellar infarcts demonstrated this morning. 2. Negative for emergent large vessel occlusion, and the posterior circulation is otherwise negative. 3. Anterior circulation remarkable for tortuous carotid arteries with left  greater than right retropharyngeal course, and calcified ICA siphon atherosclerosis resulting in Moderate Left ICA cavernous segment stenosis. 4. Emphysema and atelectasis in the lung apices.  MRI brain without contrast 06/28/2015 1. Small patchy acute right cerebellar infarcts as above. No associated hemorrhage or mass effect. 2. Remote lacunar infarcts involving the left caudate and bilateral cerebellar hemispheres, with additional small remote cortical infarct within the left occipital lobe. 3. Mild atrophy with chronic small vessel ischemic disease.   EEG Unremarkable awake and drowsy routine inpatient EEG.   PHYSICAL EXAM Pleasant middle-aged African-American lady currently not in distress. . Afebrile. Head is nontraumatic. Neck is supple without bruit.    Cardiac exam no murmur or gallop. Lungs are clear to auscultation. Distal pulses are well felt. Neurological Exam :  Awake alert oriented 3 with normal speech and language function. No aphasia or apraxia dysarthria. Extraocular movements are full range without nystagmus. Mild saccadic dysmetria on lateral gaze bilaterally. Pupils equal reactive. Vision acuity is adequate. Fundi were not visualized. Face is symmetric without weakness. Tongue is midline. Motor system exam  reveals symmetric upper and lower extremity strength. Mild diminished fine finger movements on the right. Mild right finger-to-nose and knee to heel dysmetria present. Normal sensation. Deep tendon reflexes are symmetric. Plantars are downgoing. Gait was not tested.   ASSESSMENT/PLAN [name] is a 65 y.o. female with history of hypertension, diabetes mellitus, pancreatitis, hypothyroidism and dementia presenting with diaphoresis, nausea, vomiting, SOB, R sided HA and slowed speech. She did not receive IV t-PA.   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  Resultant  Nausea & vomiting  MRI  Patchy R cerebellar  infarcts  CTA H & N  Poor flow in the Right SCA, No emergent large vessel occlusion, posterior circulation otherwise negative. Moderate Left ICA cavernous Stenosis. Emphysema and atelectasis in the lung apices.   2D Echo - pending  EEG Unremarkable  TEE to look for embolic source. [facility].  If positive for PFO (patent foramen ovale), check bilateral lower extremity venous dopplers to rule out DVT as possible source of stroke. (I have made patient NPO after midnight for Monday).  If TEE negative, a [facility] electrophysiologist will consult and consider placement of an implantable loop recorder to evaluate for atrial fibrillation as etiology of stroke. This has been explained to patient/family by Dr.Sethi and they are agreeable.   LDL 56    HgbA1c 6.4  Lovenox 40 mg sq daily for VTE prophylaxis Diet heart healthy/carb modified Room service appropriate?: Yes; Fluid consistency:: Thin Diet NPO time specified Diet NPO time specified Except for: Sips with Meds  aspirin 81 mg daily prior to admission, now on Plavix   Patient counseled to be compliant with her antithrombotic medications  Ongoing stroke risk factor control  Therapy recommendations: SNF  Disposition: SNF  Hypertension  Stable  Permissive hypertension (OK if < 220/120) but gradually normalize in 5-7 days  Hyperlipidemia  Home meds:  lipitor 40, resumed in hospital  LDL 56, goal < 70  Continue statin at discharge  Diabetes type II  HgbA1c 6.4, goal < 7.0  Other Stroke Risk Factors  Cigarette smoker, advised to stop smoking     There is no weight on file to calculate BMI.   Other Active Problems  Baseline dementia - early onset Alzheimer's dementia without behavioral disturbance  Depression & anxiety  Hypokalemia - resolved - potassium now 4.8  AKI - resolved - BUN 12 - creatinine 1.0  Protein calorie  malnutrition   PLAN  TEE and possible loop on Monday - cardiology on board.  SNF - pending  Hospital day # 2   Delton SeeDavid Rinehuls PA-C [facility] Pager [phone] 06/30/2015, 1:53 PM I have personally examined this patient, reviewed notes, independently viewed imaging studies, participated in medical decision making and plan of care. I have made any additions or clarifications directly to the above note. Await TEE and loop recorder insertion on Monday  Delia HeadyPramod Sethi, MD Pager: [phone] 06/30/2015 2:21 PM       To contact Stroke Continuity provider, please refer to [email or web address]. After hours, contact General Neurology

## 2015-07-01 ENCOUNTER — Inpatient Hospital Stay (HOSPITAL_COMMUNITY): Payer: Medicare Other

## 2015-07-01 DIAGNOSIS — I6789 Other cerebrovascular disease: Secondary | ICD-10-CM

## 2015-07-01 LAB — GLUCOSE, CAPILLARY
GLUCOSE-CAPILLARY: 125 mg/dL — AB (ref 65–99)
GLUCOSE-CAPILLARY: 148 mg/dL — AB (ref 65–99)
GLUCOSE-CAPILLARY: 106 mg/dL — AB (ref 65–99)
Glucose-Capillary: 128 mg/dL — ABNORMAL HIGH (ref 65–99)

## 2015-07-01 LAB — ECHOCARDIOGRAM COMPLETE

## 2015-07-01 NOTE — Progress Notes (Signed)
STROKE TEAM PROGRESS NOTE   SUBJECTIVE (INTERVAL HISTORY) No new complaints.neurologically stable TEE and loop planned for Monday.   OBJECTIVE Temp:  [97.5 F (36.4 C)-99.5 F (37.5 C)] 98.5 F (36.9 C) (06/04 1033) Pulse Rate:  [61-75] 61 (06/04 1033) Cardiac Rhythm:  [-] Normal sinus rhythm (06/03 2053) Resp:  [16-20] 20 (06/04 1033) BP: (134-151)/(77-102) 151/88 mmHg (06/04 1033) SpO2:  [92 %-97 %] 92 % (06/04 1033)  CBC:   Recent Labs Lab 06/27/15 2122 06/27/15 2134  WBC 9.1  --   NEUTROABS 4.4  --   HGB 13.1 14.6  HCT 39.7 43.0  MCV 84.3  --   PLT 183  --    Basic Metabolic Panel:   Recent Labs Lab 06/27/15 2122 06/27/15 2134 06/28/15 0148 06/28/15 1813 06/29/15 0602  NA 137 143  --   --  136  K 2.5* 2.6*  --  4.5 4.8  CL 107 107  --   --  108  CO2 14*  --   --   --  24  GLUCOSE 237* 245*  --   --  105*  BUN 16 21*  --   --  12  CREATININE 1.26* 1.10*  --   --  1.00  CALCIUM 9.6  --   --   --  9.7  MG  --   --  2.1  --  1.6*   Lipid Panel:     Component Value Date/Time   CHOL 109 06/28/2015 0148   TRIG 69 06/28/2015 0148   HDL 39* 06/28/2015 0148   CHOLHDL 2.8 06/28/2015 0148   VLDL 14 06/28/2015 0148   LDLCALC 56 06/28/2015 0148   HgbA1c:  Lab Results  Component Value Date   HGBA1C 6.4* 06/28/2015   Urine Drug Screen:     Component Value Date/Time   LABOPIA NONE DETECTED 06/28/2015 0006   COCAINSCRNUR NONE DETECTED 06/28/2015 0006   LABBENZ NONE DETECTED 06/28/2015 0006   AMPHETMU NONE DETECTED 06/28/2015 0006   THCU NONE DETECTED 06/28/2015 0006   LABBARB NONE DETECTED 06/28/2015 0006      IMAGING  CT angiogram of the head and neck 06/28/2015 1. Positive for Poor flow in the Right SCA, concordant with the acute superior right cerebellar infarcts demonstrated this morning. 2. Negative for emergent large vessel occlusion, and the posterior circulation is otherwise negative. 3. Anterior circulation remarkable for tortuous carotid  arteries with left greater than right retropharyngeal course, and calcified ICA siphon atherosclerosis resulting in Moderate Left ICA cavernous segment stenosis. 4. Emphysema and atelectasis in the lung apices.  MRI brain without contrast 06/28/2015 1. Small patchy acute right cerebellar infarcts as above. No associated hemorrhage or mass effect. 2. Remote lacunar infarcts involving the left caudate and bilateral cerebellar hemispheres, with additional small remote cortical infarct within the left occipital lobe. 3. Mild atrophy with chronic small vessel ischemic disease.   EEG Unremarkable awake and drowsy routine inpatient EEG.   PHYSICAL EXAM Pleasant middle-aged African-American lady currently not in distress. . Afebrile. Head is nontraumatic. Neck is supple without bruit.    Cardiac exam no murmur or gallop. Lungs are clear to auscultation. Distal pulses are well felt. Neurological Exam :  Awake alert oriented 3 with normal speech and language function. No aphasia or apraxia dysarthria. Extraocular movements are full range without nystagmus. Mild saccadic dysmetria on lateral gaze bilaterally. Pupils equal reactive. Vision acuity is adequate. Fundi were not visualized. Face is symmetric without weakness. Tongue is midline. Motor system  exam reveals symmetric upper and lower extremity strength. Mild diminished fine finger movements on the right. Mild right finger-to-nose and knee to heel dysmetria present. Normal sensation. Deep tendon reflexes are symmetric. Plantars are downgoing. Gait was not tested.   ASSESSMENT/PLAN [name] is a 65 y.o. female with history of hypertension, diabetes mellitus, pancreatitis, hypothyroidism and dementia presenting with diaphoresis, nausea, vomiting, SOB, R sided HA and slowed speech. She did not receive IV t-PA.   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  Resultant  Nausea & vomiting  MRI  Patchy R  cerebellar infarcts  CTA H & N  Poor flow in the Right SCA, No emergent large vessel occlusion, posterior circulation otherwise negative. Moderate Left ICA cavernous Stenosis. Emphysema and atelectasis in the lung apices.   2D Echo - pending  EEG Unremarkable  TEE to look for embolic source. [facility].  If positive for PFO (patent foramen ovale), check bilateral lower extremity venous dopplers to rule out DVT as possible source of stroke. (I have made patient NPO after midnight for Monday).  If TEE negative, [facility] will consult and consider placement of an implantable loop recorder to evaluate for atrial fibrillation as etiology of stroke. This has been explained to patient/family by Dr.Sethi and they are agreeable.   LDL 56    HgbA1c 6.4  Lovenox 40 mg sq daily for VTE prophylaxis Diet heart healthy/carb modified Room service appropriate?: Yes; Fluid consistency:: Thin Diet NPO time specified Diet NPO time specified Except for: Sips with Meds  aspirin 81 mg daily prior to admission, now on Plavix   Patient counseled to be compliant with her antithrombotic medications  Ongoing stroke risk factor control  Therapy recommendations: SNF  Disposition: SNF  Hypertension  Stable  Permissive hypertension (OK if < 220/120) but gradually normalize in 5-7 days  Hyperlipidemia  Home meds:  lipitor 40, resumed in hospital  LDL 56, goal < 70  Continue statin at discharge  Diabetes type II  HgbA1c 6.4, goal < 7.0  Other Stroke Risk Factors  Cigarette smoker, advised to stop smoking     There is no weight on file to calculate BMI.   Other Active Problems  Baseline dementia - early onset Alzheimer's dementia without behavioral disturbance  Depression & anxiety  Hypokalemia - resolved - potassium now 4.8  AKI - resolved - BUN 12 - creatinine 1.0  Protein calorie  malnutrition   PLAN  TEE and possible loop on Monday - cardiology on board.  SNF - pending  Hospital day # 3     No new suggestions. Await TEE and loop recorder insertion on Monday  Delia HeadyPramod Sethi, MD Pager: [phone] 07/01/2015 12:36 PM       To contact Stroke Continuity provider, please refer to [email or web address]. After hours, contact General Neurology

## 2015-07-01 NOTE — Progress Notes (Signed)
[facility] PROGRESS NOTE  [name] ZOX:[id number] DOB: 07/25/1950 DOA: 06/27/2015 PCP: Lillia Mountain, MD  Interim summary and HPI 65 y.o. female history of hypertension, diabetes mellitus, pancreatitis, hypothyroidism and dementia, brought to the emergency room by EMS and code stroke status following acute onset of feeling of weakness and numbness of breath as well as diaphoresis and slowed speech. Patient was also had nausea and vomiting. She reportedly had leftward gaze. There was no loss of consciousness. It's unclear whether she was actually confused. Blood sugar was 245. CT scan of her head showed no acute intracranial abnormality. Potassium was low at 2.6. BUN and creatinine was slightly elevated. Urinalysis is pending. Patient had no focal deficits on initial evaluation in the ED, including no eye movement abnormality. Speech was somewhat slowed but not dysarthric. NIH stroke score was 1 for not knowing her current age. Patient was noted to be diaphoretic and clammy. Code stroke was canceled. Patient was not administered IV t-PA. She was admitted for further evaluation and treatment.  Work up in process. Positive MRI for right cerebellar infarcts (multiples; raising concerns for emboli). Plan si for TEE and loop recording implantation on Monday 07/02/15.   Assessment/Plan: Dizziness and left gaze: due to right cerebellar infarcts. CT head is negative for acute intracranial abnormalities, but showed a small lacunar infarct.  -EEG unremarkable  -given abnormal MRI will complete stroke work up; including TEE and loop recorder base on findings -neurology on board and will follow rec's -secondary prevention with plavix -continue risk factors modifications   Nausea, vomiting: Etiology is not clear. Likely due to viral gastritis Vs associated with stroke. -had hx of chronic pancreatitis  -lipase WNL -continue IVF's -follow/replete electrolytes -PRN antiemetics  Depression  and anxiety: no suicidal or homicidal ideations. -Continue home medications: PRN Xanax and daily Remeron/Zoloft  Hypokalemia: K=2.5 on admission. -repleted and WNL now -will follow trend  -Mg WNL  HTN: -Holding lisinopril due to acute renal injury -Continue amlodipine -IV hydralazine PRN  HLD: Last LDL was 56 on 07/02/13 and remains at 56 on repeated lipid panel during this admission -will continue statins  DM-II: Last A1c 6.0 on 10/17/13, well controled. Patient is taking metformin at home; which is on hold due to AKI and inpatient status. -will continue SSI -A1c 6.4  Early onset Alzheimer's dementia without behavioral disturbance: -continue Donepezil  Tobacco abuse: -cessation counseling provided -Nicotine patch ordered   AKI: Likely due to prerenal secondary to dehydration and continuation of ACEI and NSAIDs -continue IVF as above; will start lowering rate. Patient eating and drinking better. -Follow up renal function trend -continue holding lisinopril and Mobic  Protein-calorie malnutrition, moderate (HCC): -Consultation to nutritional service in place -will follow rec's for feeding supplements    Code Status: Full Family Communication: no family at bedside Disposition Plan: complete work up for stroke, follow PT/OT recommendations for discharge; neurology on board, will follow rec's.   Consultants:  Neurology   Cardiology for TEE/ loop recorder implantation   Procedures:  MRI brain: with patchy right cerebellar infarcts   TEE: pending  Ct angio of head and neck: Poor flow in the Right SCA, No emergent large vessel occlusion, posterior circulation otherwise negative. Moderate Left ICA cavernous Stenosis. Emphysema and atelectasis in the lung apices.   Loop recorder implantation: depending on TEE  EEG: unremarkable   Antibiotics:  None   HPI/Subjective: Reports some intermittent lightheadedness. No dysarthria on exam and denies HA's currently. No CP  or SOB  Objective: Filed  Vitals:   07/01/15 0656 07/01/15 1033  BP: 144/89 151/88  Pulse: 71 61  Temp: 98.1 F (36.7 C) 98.5 F (36.9 C)  Resp: 16 20    Intake/Output Summary (Last 24 hours) at 07/01/15 1057 Last data filed at 06/30/15 1852  Gross per 24 hour  Intake    220 ml  Output      0 ml  Net    220 ml   There were no vitals filed for this visit.  Exam:   General: afebrile, denies CP and SOB. Overall stable. Patient w/o dysarthria or focal motor deficit. Still Reporting some slight dizziness intermittently.   Cardiovascular: S1 and S2, no rubs or gallops  Respiratory: good air movement, no wheezing, no crackles  Abdomen: soft, NT, ND, positive BS  Musculoskeletal: no edema or cyanosis   Neuro: EOMI, grossly normal CN; MS 4//5 bilaterally due to poor effort. Mild dysmetria on finger to nose and slight diminish changes on fine finger movements on the right.  Data Reviewed: Basic Metabolic Panel:  Recent Labs Lab 06/27/15 2122 06/27/15 2134 06/28/15 0148 06/28/15 1813 06/29/15 0602  NA 137 143  --   --  136  K 2.5* 2.6*  --  4.5 4.8  CL 107 107  --   --  108  CO2 14*  --   --   --  24  GLUCOSE 237* 245*  --   --  105*  BUN 16 21*  --   --  12  CREATININE 1.26* 1.10*  --   --  1.00  CALCIUM 9.6  --   --   --  9.7  MG  --   --  2.1  --  1.6*   Liver Function Tests:  Recent Labs Lab 06/27/15 2122 06/28/15 0148  AST 32 30  ALT 19 21  ALKPHOS 78 77  BILITOT 0.7 0.4  PROT 8.0 7.4  ALBUMIN 3.7 3.5    Recent Labs Lab 06/28/15 0148  LIPASE 41   CBC:  Recent Labs Lab 06/27/15 2122 06/27/15 2134  WBC 9.1  --   NEUTROABS 4.4  --   HGB 13.1 14.6  HCT 39.7 43.0  MCV 84.3  --   PLT 183  --    Cardiac Enzymes:  Recent Labs Lab 06/28/15 0148 06/28/15 0524 06/28/15 1133  TROPONINI <0.03 <0.03 <0.03   CBG:  Recent Labs Lab 06/30/15 0608 06/30/15 1119 06/30/15 1615 06/30/15 2238 07/01/15 0702  GLUCAP 101* 91 100* 133* 125*    Studies: No results found.  Scheduled Meds: .  stroke: mapping our early stages of recovery book   Does not apply Once  . ALPRAZolam  0.5 mg Oral Daily  . amLODipine  5 mg Oral Daily  . aspirin EC  81 mg Oral Daily  . atorvastatin  40 mg Oral Daily  . calcium carbonate  1,250 mg Oral Q breakfast  . clopidogrel  75 mg Oral Daily  . donepezil  10 mg Oral QHS  . enoxaparin (LOVENOX) injection  40 mg Subcutaneous Daily  . feeding supplement (GLUCERNA SHAKE)  237 mL Oral TID BM  . insulin aspart  0-5 Units Subcutaneous QHS  . insulin aspart  0-9 Units Subcutaneous TID WC  . levothyroxine  50 mcg Oral QAC breakfast  . multivitamin with minerals  1 tablet Oral Daily  . nicotine  21 mg Transdermal Daily  . sertraline  100 mg Oral BID   Continuous Infusions: . sodium chloride 125 mL/hr at  06/29/15 [id number]    Principal Problem:   Dizziness Active Problems:   Major depressive disorder (HCC)   Hypertension   Chronic back pain   Hyperlipidemia   Diabetes mellitus, type 2 (HCC)   Early onset Alzheimer's dementia without behavioral disturbance   Tobacco abuse   Anxiety   Hypokalemia   AKI (acute kidney injury) (HCC)   Protein-calorie malnutrition, moderate (HCC)   Nausea & vomiting   Acute CVA (cerebrovascular accident) (HCC)   Cerebellar stroke (HCC)   Emesis    Time spent: 30 minutes    Vassie LollMadera, Iker Nuttall  [facility] Pager [phone]. If 7PM-7AM, please contact night-coverage at [email or web address], password [email or web address] 07/01/2015, 10:57 AM  LOS: 3 days

## 2015-07-01 NOTE — Progress Notes (Signed)
  Echocardiogram 2D Echocardiogram has been performed.  [name] 07/01/2015, 10:10 AM

## 2015-07-02 ENCOUNTER — Encounter (HOSPITAL_COMMUNITY)
Admission: EM | Disposition: A | Discharge status: Skilled Nursing Facility | Payer: Self-pay | Source: Home / Self Care | Attending: Internal Medicine

## 2015-07-02 ENCOUNTER — Inpatient Hospital Stay (HOSPITAL_COMMUNITY): Payer: Medicare Other

## 2015-07-02 ENCOUNTER — Encounter (HOSPITAL_COMMUNITY): Payer: Medicare Other

## 2015-07-02 DIAGNOSIS — I639 Cerebral infarction, unspecified: Secondary | ICD-10-CM

## 2015-07-02 DIAGNOSIS — I63442 Cerebral infarction due to embolism of left cerebellar artery: Secondary | ICD-10-CM

## 2015-07-02 LAB — GLUCOSE, CAPILLARY
GLUCOSE-CAPILLARY: 128 mg/dL — AB (ref 65–99)
GLUCOSE-CAPILLARY: 112 mg/dL — AB (ref 65–99)
GLUCOSE-CAPILLARY: 150 mg/dL — AB (ref 65–99)
Glucose-Capillary: 131 mg/dL — ABNORMAL HIGH (ref 65–99)

## 2015-07-02 SURGERY — LOOP RECORDER INSERTION
Anesthesia: LOCAL

## 2015-07-02 NOTE — NC FL2 (Signed)
[place] MEDICAID FL2 LEVEL OF CARE SCREENING TOOL     IDENTIFICATION  Patient Name: [name] Birthdate: [birth year] Sex: female Admission Date (Current Location): 06/27/2015  [facility] and [place] Number:  Producer, television/film/video and Address:  [facility] [place], [address], [place], [place] [address]      Provider Number: [id number]  Attending Physician Name and Address:  Shannon Loll, MD  Relative Name and Phone Number:  [name], sister, [phone]    Current Level of Care: Hospital Recommended Level of Care: Skilled Nursing Facility Prior Approval Number:    Date Approved/Denied:   PASRR Number: [id number]  Discharge Plan: SNF    Current Diagnoses: Patient Active Problem List   Diagnosis Date Noted  . AKI (acute kidney injury) (HCC) 06/28/2015  . Protein-calorie malnutrition, moderate (HCC) 06/28/2015  . Nausea & vomiting 06/28/2015  . Acute CVA (cerebrovascular accident) (HCC) 06/28/2015  . Hypokalemia   . Cerebellar stroke (HCC)   . Emesis   . Anxiety 06/27/2015  . Dizziness 06/27/2015  . Tobacco abuse   . Early onset Alzheimer's dementia without behavioral disturbance 11/03/2014  . Memory deficits 07/03/2014  . Trigger finger, acquired 09/13/2012  . Pain in both wrists 05/12/2012  . Non-proliferative diabetic retinopathy, both eyes (HCC) 05/07/2012  . Colon cancer screening 12/23/2011  . Unintentional weight loss 12/22/2011  . Memory problem 10/31/2011  . Idiopathic acute pancreatitis 10/29/2011  . Major depressive disorder (HCC) 09/10/2011  . Hypertension 09/10/2011  . Chronic back pain 09/10/2011  . Hyperlipidemia 09/10/2011  . Diabetes mellitus, type 2 (HCC) 09/10/2011    Orientation RESPIRATION BLADDER Height & Weight     Self, Time, Situation, Place  Normal Continent Weight:   Height:     BEHAVIORAL SYMPTOMS/MOOD NEUROLOGICAL BOWEL NUTRITION STATUS      Incontinent Diet (Please see DC Summary)  AMBULATORY STATUS  COMMUNICATION OF NEEDS Skin   Extensive Assist Verbally Normal                       Personal Care Assistance Level of Assistance  Bathing, Feeding, Dressing Bathing Assistance: Limited assistance Feeding assistance: Independent Dressing Assistance: Limited assistance     Functional Limitations Info             SPECIAL CARE FACTORS FREQUENCY  PT (By licensed PT)     PT Frequency: min 4x/week              Contractures      Additional Factors Info  Code Status, Allergies, Insulin Sliding Scale, Psychotropic Code Status Info: Full Allergies Info: NKA Psychotropic Info: Xanax;Zoloft Insulin Sliding Scale Info: insulin aspart (novoLOG) injection 0-5 Units;insulin aspart (novoLOG) injection 0-9 Units;       Current Medications (07/02/2015):  This is the current hospital active medication list Current Facility-Administered Medications  Medication Dose Route Frequency Provider Last Rate Last Dose  .  stroke: mapping our early stages of recovery book   Does not apply Once Shannon Harp, MD      . 0.9 %  sodium chloride infusion   Intravenous Continuous Shannon Harp, MD 125 mL/hr at 06/29/15 [phone]    . acetaminophen (TYLENOL) tablet 325 mg  325 mg Oral Q6H PRN Shannon Harp, MD      . ALPRAZolam Prudy Feeler) tablet 0.5 mg  0.5 mg Oral Daily Shannon Harp, MD   0.5 mg at 07/02/15 1250  . amLODipine (NORVASC) tablet 5 mg  5 mg Oral Daily Shannon Harp,  MD   5 mg at 07/02/15 1250  . aspirin EC tablet 81 mg  81 mg Oral Daily Shannon HarpXilin Niu, MD   81 mg at 07/02/15 1251  . atorvastatin (LIPITOR) tablet 40 mg  40 mg Oral Daily Shannon HarpXilin Niu, MD   40 mg at 07/02/15 1250  . calcium carbonate (OS-CAL - dosed in mg of elemental calcium) tablet 1,250 mg  1,250 mg Oral Q breakfast Shannon HarpXilin Niu, MD   1,250 mg at 07/02/15 1251  . clopidogrel (PLAVIX) tablet 75 mg  75 mg Oral Daily Shannon RileyPramod S Sethi, MD   75 mg at 07/02/15 1251  . donepezil (ARICEPT) tablet 10 mg  10 mg Oral QHS Shannon HarpXilin Niu, MD   10 mg at 07/01/15 2141  .  enoxaparin (LOVENOX) injection 40 mg  40 mg Subcutaneous Daily Shannon HarpXilin Niu, MD   40 mg at 07/02/15 1252  . feeding supplement (GLUCERNA SHAKE) (GLUCERNA SHAKE) liquid 237 mL  237 mL Oral TID BM Shannon Lollarlos Madera, MD   237 mL at 07/01/15 2142  . hydrALAZINE (APRESOLINE) injection 5 mg  5 mg Intravenous Q2H PRN Shannon HarpXilin Niu, MD      . HYDROcodone-acetaminophen (NORCO/VICODIN) 5-325 MG per tablet 1 tablet  1 tablet Oral Q4H PRN Shannon HarpXilin Niu, MD      . insulin aspart (novoLOG) injection 0-5 Units  0-5 Units Subcutaneous QHS Shannon HarpXilin Niu, MD      . insulin aspart (novoLOG) injection 0-9 Units  0-9 Units Subcutaneous TID WC Shannon HarpXilin Niu, MD   1 Units at 07/02/15 [phone]  . levothyroxine (SYNTHROID, LEVOTHROID) tablet 50 mcg  50 mcg Oral QAC breakfast Shannon HarpXilin Niu, MD   50 mcg at 07/02/15 1252  . meclizine (ANTIVERT) tablet 12.5 mg  12.5 mg Oral TID PRN Shannon RileyPramod S Sethi, MD   12.5 mg at 06/28/15 1503  . mirtazapine (REMERON) tablet 15 mg  15 mg Oral Daily PRN Shannon HarpXilin Niu, MD   15 mg at 07/01/15 2141  . multivitamin with minerals tablet 1 tablet  1 tablet Oral Daily Shannon HarpXilin Niu, MD   1 tablet at 07/02/15 1250  . nicotine (NICODERM CQ - dosed in mg/24 hours) patch 21 mg  21 mg Transdermal Daily Shannon HarpXilin Niu, MD   21 mg at 07/02/15 1251  . ondansetron (ZOFRAN) injection 4 mg  4 mg Intravenous Q8H PRN Shannon HarpXilin Niu, MD      . senna-docusate (Senokot-S) tablet 1 tablet  1 tablet Oral QHS PRN Shannon HarpXilin Niu, MD      . sertraline (ZOLOFT) tablet 100 mg  100 mg Oral BID Shannon HarpXilin Niu, MD   100 mg at 07/02/15 1252     Discharge Medications: Please see discharge summary for a list of discharge medications.  Relevant Imaging Results:  Relevant Lab Results:   Additional Information SSN: [id number] [name]  [name], [place]

## 2015-07-02 NOTE — Progress Notes (Signed)
STROKE TEAM PROGRESS NOTE   SUBJECTIVE (INTERVAL HISTORY) No family at bedside. She stated that she has dementia and can not remember [facility] This morning patient alert and oriented when speaking with cardiology. Asking appropriate quesitons, insightful, agreeable to TEE procedure. I also confirmed with her about the procedure. However, later in day, patient refusing to sign consent. States no one has talked to her and not interested in having done. I talked to her again in the afternoon, but she just ate lunch. TEE cancelled. She stated that the complications associated with TEE are too serious for her to agree on. She would like to discuss with her PCP before consenting.    OBJECTIVE Temp:  [98.1 F (36.7 C)-99.3 F (37.4 C)] 98.3 F (36.8 C) (06/05 1450) Pulse Rate:  [64-86] 86 (06/05 1450) Cardiac Rhythm:  [-] Normal sinus rhythm (06/05 0703) Resp:  [18-20] 20 (06/05 1450) BP: (122-154)/(75-94) 150/81 mmHg (06/05 1450) SpO2:  [91 %-98 %] 91 % (06/05 1450)  CBC:   Recent Labs Lab 06/27/15 2122 06/27/15 2134  WBC 9.1  --   NEUTROABS 4.4  --   HGB 13.1 14.6  HCT 39.7 43.0  MCV 84.3  --   PLT 183  --    Basic Metabolic Panel:   Recent Labs Lab 06/27/15 2122 06/27/15 2134 06/28/15 0148 06/28/15 1813 06/29/15 0602  NA 137 143  --   --  136  K 2.5* 2.6*  --  4.5 4.8  CL 107 107  --   --  108  CO2 14*  --   --   --  24  GLUCOSE 237* 245*  --   --  105*  BUN 16 21*  --   --  12  CREATININE 1.26* 1.10*  --   --  1.00  CALCIUM 9.6  --   --   --  9.7  MG  --   --  2.1  --  1.6*   Lipid Panel:     Component Value Date/Time   CHOL 109 06/28/2015 0148   TRIG 69 06/28/2015 0148   HDL 39* 06/28/2015 0148   CHOLHDL 2.8 06/28/2015 0148   VLDL 14 06/28/2015 0148   LDLCALC 56 06/28/2015 0148   HgbA1c:  Lab Results  Component Value Date   HGBA1C 6.4* 06/28/2015   Urine Drug Screen:     Component Value Date/Time   LABOPIA NONE DETECTED  06/28/2015 0006   COCAINSCRNUR NONE DETECTED 06/28/2015 0006   LABBENZ NONE DETECTED 06/28/2015 0006   AMPHETMU NONE DETECTED 06/28/2015 0006   THCU NONE DETECTED 06/28/2015 0006   LABBARB NONE DETECTED 06/28/2015 0006    IMAGING I have personally reviewed the radiological images below and agree with the radiology interpretations.  CT angiogram of the head and neck 06/28/2015 1. Positive for Poor flow in the Right SCA, concordant with the acute superior right cerebellar infarcts demonstrated this morning. 2. Negative for emergent large vessel occlusion, and the posterior circulation is otherwise negative. 3. Anterior circulation remarkable for tortuous carotid arteries with left greater than right retropharyngeal course, and calcified ICA siphon atherosclerosis resulting in Moderate Left ICA cavernous segment stenosis. 4. Emphysema and atelectasis in the lung apices.  MRI brain without contrast 06/28/2015 1. Small patchy acute right cerebellar infarcts as above. No associated hemorrhage or mass effect. 2. Remote lacunar infarcts involving the left caudate and bilateral cerebellar hemispheres, with additional small remote cortical infarct within the left occipital lobe. 3.  Mild atrophy with chronic small vessel ischemic disease.  EEG Unremarkable awake and drowsy routine inpatient EEG.  2D Echocardiogram  - Left ventricle: The cavity size was normal. There was moderate concentric hypertrophy. Systolic function was normal. The estimated ejection fraction was in the range of 60% to 65%. Wall motion was normal; there were no regional wall motion abnormalities. There was an increased relative contribution of atrial contraction to ventricular filling. Doppler parameters are consistent with abnormal left ventricular relaxation (grade 1 diastolic dysfunction). - Mitral valve: Calcified annulus. - Tricuspid valve: There was trivial regurgitation.   LE venous doppler No evidence of DVT or  baker's cyst.   PHYSICAL EXAM Pleasant middle-aged African-American lady currently not in distress. . Afebrile. Head is nontraumatic. Neck is supple without bruit.   Cardiac exam no murmur or gallop. Lungs are clear to auscultation. Distal pulses are well felt. Neurological Exam :  Awake alert oriented 3 with normal speech and language function. No aphasia or apraxia dysarthria. Extraocular movements are full range without nystagmus. Mild saccadic dysmetria on lateral gaze bilaterally. Pupils equal reactive. Vision acuity is adequate. Fundi were not visualized. Face is symmetric without weakness. Tongue is midline. Motor system exam reveals symmetric upper and lower extremity strength. Mild diminished fine finger movements on the right. No finger-to-nose and knee to heel ataxia or dysmetria present. Normal sensation. Deep tendon reflexes are symmetric. Plantars are downgoing. Gait was not tested.   ASSESSMENT/PLAN [name] is a 65 y.o. female with history of hypertension, diabetes mellitus, pancreatitis, hypothyroidism and dementia presenting with diaphoresis, nausea, vomiting, SOB, and slowed speech. She did not receive IV t-PA.   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   2D Echo - EF 60-65%. No source of embolus   EEG Unremarkable   LE venous doppler negative   TEE canceled. Loop canceled as pt refused to sign the consent  Recommand 30 day cardiac monitor as outpt to look for atrial fibrillation as possible source.  LDL 56    HgbA1c 6.4  Lovenox 40 mg sq daily for VTE prophylaxis Diet Carb Modified Fluid consistency:: Thin; Room service appropriate?: Yes  aspirin 81 mg daily prior to admission, now on ASA and Plavix. Continue DAPT for 3 months and then  plavix alone.   Patient counseled to be compliant with her antithrombotic medications  Ongoing stroke risk factor control  Therapy recommendations: SNF  Disposition: SNF  Hypertension  Stable  Permissive hypertension (OK if < 220/120) but gradually normalize in 5-7 days  Hyperlipidemia  Home meds:  lipitor 40, resumed in hospital  LDL 56, goal < 70  Continue statin at discharge  Diabetes type II  HgbA1c 6.4, goal < 7.0  Controlled  SSI  Tobacco abuse  Current smoker  Smoking cessation counseling provided  Nicotine patch provided  Pt is willing to quit  Other Stroke Risk Factors  Advanced age  Other Active Problems  Baseline dementia - early onset Alzheimer's dementia without behavioral disturbance  Depression & anxiety  Hypokalemia - resolved - potassium now 4.8  AKI - resolved - BUN 12 - creatinine 1.0  Protein calorie malnutrition  Hospital day # 4   Neurology will sign off. Please call with questions. Pt will follow up with Dr. Roda Shutters at [facility]  in about 2 months. Thanks for the consult.  Marvel Plan, MD PhD Stroke Neurology 07/02/2015 10:36 PM     To contact Stroke Continuity provider, please refer to [email or web address]. After hours, contact General Neurology

## 2015-07-02 NOTE — Progress Notes (Signed)
[facility] PROGRESS NOTE  [name] GNF:[id number] DOB: 06/11/1950 DOA: 06/27/2015 PCP: Lillia Mountain, MD  Interim summary and HPI 65 y.o. female history of hypertension, diabetes mellitus, pancreatitis, hypothyroidism and dementia, brought to the emergency room by EMS and code stroke status following acute onset of feeling of weakness and numbness of breath as well as diaphoresis and slowed speech. Patient was also had nausea and vomiting. She reportedly had leftward gaze. There was no loss of consciousness. It's unclear whether she was actually confused. Blood sugar was 245. CT scan of her head showed no acute intracranial abnormality. Potassium was low at 2.6. BUN and creatinine was slightly elevated. Urinalysis is pending. Patient had no focal deficits on initial evaluation in the ED, including no eye movement abnormality. Speech was somewhat slowed but not dysarthric. NIH stroke score was 1 for not knowing her current age. Patient was noted to be diaphoretic and clammy. Code stroke was canceled. Patient was not administered IV t-PA. She was admitted for further evaluation and treatment.  Work up in process. Positive MRI for right cerebellar infarcts (multiples; raising concerns for emboli). Plan si for TEE and loop recording implantation later today 07/02/15.   Assessment/Plan: Dizziness and left gaze: due to right cerebellar infarcts. CT head is negative for acute intracranial abnormalities, but showed a small lacunar infarct.  -EEG unremarkable  -given abnormal MRI will complete stroke work up; including TEE and loop recorder base on findings -neurology on board and will follow rec's -secondary prevention with plavix -continue risk factors modifications   Nausea, vomiting: Etiology is not clear. Likely due to viral gastritis Vs associated with stroke. -had hx of chronic pancreatitis  -lipase WNL -continue IVF's -follow/replete electrolytes -PRN antiemetics -symptoms  resolved   Depression and anxiety: no suicidal or homicidal ideations. -Continue home medications: PRN Xanax and daily Remeron/Zoloft  Hypokalemia: K=2.5 on admission. -repleted and WNL now -will follow trend  -Mg WNL  HTN: -Holding lisinopril due to acute renal injury -Continue amlodipine -IV hydralazine PRN  HLD: Last LDL was 56 on 07/02/13 and remains at 56 on repeated lipid panel during this admission -will continue statins  DM-II: Last A1c 6.0 on 10/17/13, well controled. Patient is taking metformin at home; which is on hold due to AKI and inpatient status. -will continue SSI -A1c 6.4  Early onset Alzheimer's dementia without behavioral disturbance: -continue Donepezil and supportive care  Tobacco abuse: -cessation counseling provided -Nicotine patch ordered   AKI: Likely due to prerenal secondary to dehydration and continuation of ACEI and NSAIDs -continue IVF as above; will start lowering rate. Patient eating and drinking better. -Follow up renal function trend -continue holding lisinopril and Mobic -Will repeat BMET in am  Protein-calorie malnutrition, moderate (HCC): -Consultation to nutritional service in place -will follow rec's for feeding supplements    Code Status: Full Family Communication: no family at bedside Disposition Plan: complete work up for stroke, follow PT/OT recommendations for discharge; neurology on board, will follow rec's.   Consultants:  Neurology   Cardiology for TEE/ loop recorder implantation   Procedures:  MRI brain: with patchy right cerebellar infarcts   TEE: pending  Ct angio of head and neck: Poor flow in the Right SCA, No emergent large vessel occlusion, posterior circulation otherwise negative. Moderate Left ICA cavernous Stenosis. Emphysema and atelectasis in the lung apices.   Loop recorder implantation: depending on TEE  EEG: unremarkable   Antibiotics:  None   HPI/Subjective: Reports some intermittent  lightheadedness; denies HA's. No dysarthria.  No CP or SOB  Objective: Filed Vitals:   07/02/15 0136 07/02/15 0623  BP: 154/75 136/91  Pulse: 64 66  Temp: 98.4 F (36.9 C) 98.1 F (36.7 C)  Resp: 18 18   No intake or output data in the 24 hours ending 07/02/15 0926 There were no vitals filed for this visit.  Exam:   General: afebrile, denies CP and SOB. Overall stable. Patient w/o dysarthria or focal motor deficit; in no distress. Oriented X 2; but with overall decrease insight (probably baseline for her her dementia).   Cardiovascular: S1 and S2, no rubs or gallops  Respiratory: good air movement, no wheezing, no crackles  Abdomen: soft, NT, ND, positive BS  Musculoskeletal: no edema or cyanosis   Neuro: EOMI, grossly normal CN; MS 4//5 bilaterally due to poor effort. Mild dysmetria on finger to nose and slight diminish changes on fine finger movements on the right.  Data Reviewed: Basic Metabolic Panel:  Recent Labs Lab 06/27/15 2122 06/27/15 2134 06/28/15 0148 06/28/15 1813 06/29/15 0602  NA 137 143  --   --  136  K 2.5* 2.6*  --  4.5 4.8  CL 107 107  --   --  108  CO2 14*  --   --   --  24  GLUCOSE 237* 245*  --   --  105*  BUN 16 21*  --   --  12  CREATININE 1.26* 1.10*  --   --  1.00  CALCIUM 9.6  --   --   --  9.7  MG  --   --  2.1  --  1.6*   Liver Function Tests:  Recent Labs Lab 06/27/15 2122 06/28/15 0148  AST 32 30  ALT 19 21  ALKPHOS 78 77  BILITOT 0.7 0.4  PROT 8.0 7.4  ALBUMIN 3.7 3.5    Recent Labs Lab 06/28/15 0148  LIPASE 41   CBC:  Recent Labs Lab 06/27/15 2122 06/27/15 2134  WBC 9.1  --   NEUTROABS 4.4  --   HGB 13.1 14.6  HCT 39.7 43.0  MCV 84.3  --   PLT 183  --    Cardiac Enzymes:  Recent Labs Lab 06/28/15 0148 06/28/15 0524 06/28/15 1133  TROPONINI <0.03 <0.03 <0.03   CBG:  Recent Labs Lab 07/01/15 0702 07/01/15 1124 07/01/15 1630 07/01/15 2217 07/02/15 0621  GLUCAP 125* 148* 106* 128* 128*    Studies: No results found.  Scheduled Meds: .  stroke: mapping our early stages of recovery book   Does not apply Once  . ALPRAZolam  0.5 mg Oral Daily  . amLODipine  5 mg Oral Daily  . aspirin EC  81 mg Oral Daily  . atorvastatin  40 mg Oral Daily  . calcium carbonate  1,250 mg Oral Q breakfast  . clopidogrel  75 mg Oral Daily  . donepezil  10 mg Oral QHS  . enoxaparin (LOVENOX) injection  40 mg Subcutaneous Daily  . feeding supplement (GLUCERNA SHAKE)  237 mL Oral TID BM  . insulin aspart  0-5 Units Subcutaneous QHS  . insulin aspart  0-9 Units Subcutaneous TID WC  . levothyroxine  50 mcg Oral QAC breakfast  . multivitamin with minerals  1 tablet Oral Daily  . nicotine  21 mg Transdermal Daily  . sertraline  100 mg Oral BID   Continuous Infusions: . sodium chloride 125 mL/hr at 06/29/15 16100627    Principal Problem:   Dizziness Active Problems:   Major  depressive disorder (HCC)   Hypertension   Chronic back pain   Hyperlipidemia   Diabetes mellitus, type 2 (HCC)   Early onset Alzheimer's dementia without behavioral disturbance   Tobacco abuse   Anxiety   Hypokalemia   AKI (acute kidney injury) (HCC)   Protein-calorie malnutrition, moderate (HCC)   Nausea & vomiting   Acute CVA (cerebrovascular accident) (HCC)   Cerebellar stroke (HCC)   Emesis    Time spent: 30 minutes    Vassie Loll  [facility] Pager [phone]. If 7PM-7AM, please contact night-coverage at [email or web address], password [facility] 07/02/2015, 9:26 AM  LOS: 4 days

## 2015-07-02 NOTE — Clinical Social Work Note (Signed)
Clinical Social Work Assessment  Patient Details  Name: [name] MRN: [id number] Date of Birth: 1950-08-17  Date of referral:  07/02/15               Reason for consult:  Facility Placement                Permission sought to share information with:  Facility Sport and exercise psychologist, Family Supports Permission granted to share information::  Yes, Verbal Permission Granted  Name::     Kenney Houseman  Agency::  [facility]  Relationship::  sister  Contact Information:  [phone]  Housing/Transportation Living arrangements for the past 2 months:  [place] of Information:  Patient Patient Interpreter Needed:  None Criminal Activity/Legal Involvement Pertinent to Current Situation/Hospitalization:  No - Comment as needed Significant Relationships:  Siblings, Significant Other Lives with:  Significant Other Do you feel safe going back to the place where you live?  No Need for family participation in patient care:  Yes (Comment)  Care giving concerns:  CSW received referral for possible SNF placement at time of discharge. CSW met with patient regarding PT recommendation of SNF placement at time of discharge. Patient lives with her boyfriend, but given patient's current physical needs and fall risk she would like to consider SNF. Patient expressed understanding of PT recommendation and is agreeable to SNF placement at time of discharge. CSW to continue to follow and assist with discharge planning needs.   Social Worker assessment / plan:  CSW spoke with patient concerning possibility of rehab at [facility] before returning home.  Employment status:  Retired Nurse, adult PT Recommendations:  [place] / Referral to community resources:  [place]  Patient/Family's Response to care:  Patient recognizes need for rehab before returning home and is agreeable to a SNF in [place]. Patient reported  preference for [facility] where she lives. Patient gave me permission to call and update her sister.   Patient/Family's Understanding of and Emotional Response to Diagnosis, Current Treatment, and Prognosis:  Patient is realistic regarding therapy needs. No questions/concerns about plan or treatment.    Emotional Assessment Appearance:  Appears stated age Attitude/Demeanor/Rapport:  Other (Appropriate) Affect (typically observed):  Accepting, Pleasant, Adaptable, Appropriate Orientation:  Oriented to Place, Oriented to Self, Oriented to  Time, Oriented to Situation Alcohol / Substance use:  Not Applicable Psych involvement (Current and /or in the community):  No (Comment)  Discharge Needs  Concerns to be addressed:  Care Coordination Readmission within the last 30 days:  No Current discharge risk:  None Barriers to Discharge:  Continued Medical Work up   [employer], [place] 07/02/2015, 1:11 PM

## 2015-07-02 NOTE — Progress Notes (Signed)
*  PRELIMINARY RESULTS* Vascular Ultrasound Lower extremity venous duplex has been completed.  Preliminary findings: No evidence of DVT or baker's cyst.   Farrel DemarkJill Eunice, RDMS, RVT  07/02/2015, 4:13 PM

## 2015-07-02 NOTE — Care Management Note (Signed)
Case Management Note  Patient Details  Name: [name] MRN: [id number] Date of Birth: May 01, 1950  Subjective/Objective:                    Action/Plan: Pt for TEE/ loop recorder today. Rec is for SNF. CM following for d/c needs.   Expected Discharge Date:                  Expected Discharge Plan:     In-House Referral:     Discharge planning Services     Post Acute Care Choice:    Choice offered to:     DME Arranged:    DME Agency:     HH Arranged:    HH Agency:     Status of Service:  In process, will continue to follow  Medicare Important Message Given:  Yes Date Medicare IM Given:    Medicare IM give by:    Date Additional Medicare IM Given:    Additional Medicare Important Message give by:     If discussed at Long Length of Stay Meetings, dates discussed:    Additional Comments:  Kermit BaloKelli F Chantel Teti, RN 07/02/2015, 1:38 PM

## 2015-07-02 NOTE — Care Management Important Message (Signed)
Important Message  Patient Details  Name: [name] MRN: [id number] Date of Birth: 1950/08/11   Medicare Important Message Given:  Yes    Bernadette HoitShoffner, Carlous Olivares Coleman 07/02/2015, 11:54 AM

## 2015-07-02 NOTE — Progress Notes (Signed)
Physical Therapy Treatment Patient Details Name: [name] MRN: [id number] DOB: 06/13/1950 Today's Date: 07/02/2015    History of Present Illness Pt is a 65 year old female admitted for dizziness, L gaze, slow speech, diaphoresis, and nausea vomiting. 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 Pt able to improve balance and stability with cues for increased cadence and gaze stabilization. Tolerated balance activities with much difficulty esp with narrowing BOS or closing eyes. Not sure to return home alone. Will follow acutely.   Follow Up Recommendations  SNF;Supervision/Assistance - 24 hour     Equipment Recommendations  Rolling walker with 5" wheels    Recommendations for Other Services       Precautions / Restrictions Precautions Precautions: Fall Restrictions Weight Bearing Restrictions: No    Mobility  Bed Mobility Overal bed mobility: Needs Assistance Bed Mobility: Supine to Sit;Sit to Supine     Supine to sit: Supervision Sit to supine: Supervision   General bed mobility comments: Supervision for safety.   Transfers Overall transfer level: Needs assistance Equipment used: None Transfers: Sit to/from Stand Sit to Stand: Min guard         General transfer comment: Min guard for safety. Stood from EOB x4. Unsteady in standing holding onto bed for support.  Ambulation/Gait Ambulation/Gait assistance: Min assist Ambulation Distance (Feet): 160 Feet Assistive device: 1 person hand held assist Gait Pattern/deviations: Step-through pattern;Decreased stride length;Wide base of support Gait velocity: decr   General Gait Details: Ataxic like gait pattern with unsteadiness visible esp with slower gait speeds. CUes for gaze stabilization to  help with balance esp during turns. Handheld assist for support and reaching for rail as well. Cues to increase cadence.   Stairs            Wheelchair Mobility    Modified Rankin (Stroke Patients Only) Modified Rankin (Stroke Patients Only) Pre-Morbid Rankin Score: No symptoms Modified Rankin: Moderately severe disability     Balance Overall balance assessment: Needs assistance Sitting-balance support: Feet supported;No upper extremity supported Sitting balance-Leahy Scale: Good     Standing balance support: During functional activity Standing balance-Leahy Scale: Poor Standing balance comment: Requires UE support.     Tandem Stance - Right Leg:  (Attempted to get into tandem stance but unable too even with assist.) Tandem Stance - Left Leg:  (Attempted to get into tandem stance but unable too even with assist. more difficulty with LLE.) Rhomberg - Eyes Opened: 30 Rhomberg - Eyes Closed: 30 (increased sway )        Cognition Arousal/Alertness: Awake/alert Behavior During Therapy: WFL for tasks assessed/performed Overall Cognitive Status: History of cognitive impairments - at baseline       Memory: Decreased short-term memory              Exercises      General Comments        Pertinent Vitals/Pain Pain Assessment: No/denies pain    Home Living                      Prior Function            PT Goals (current goals can now be found in the care plan section) Progress towards PT goals: Progressing toward goals    Frequency  Min 4X/week    PT Plan Current plan remains appropriate    Co-evaluation             End of Session Equipment Utilized During Treatment: Gait belt Activity Tolerance: Patient tolerated treatment well Patient left: in bed;with call bell/phone within reach;with bed alarm set     Time: 1011-1038 PT Time Calculation (min) (ACUTE ONLY): 27 min  Charges:  $Gait Training: 8-22 mins $Neuromuscular  Re-education: 8-22 mins                    G Codes:      Ege Muckey A Zachry Hopfensperger 07/02/2015, 11:16 AM Mylo Red, PT, DPT [phone]

## 2015-07-02 NOTE — Consult Note (Signed)
ELECTROPHYSIOLOGY CONSULT NOTE  Patient ID: [name] MRN: [id number], DOB/AGE: Mar 16, 1950   Admit date: 06/27/2015 Date of Consult: 07/02/2015  Primary Physician: Lillia MountainGRIFFIN,JOHN JOSEPH, MD Primary Cardiologist: (none) Reason for Consultation: Cryptogenic stroke -  recommendations regarding Implantable Loop Recorder Requesting MD: Dr. Pearlean BrownieSethi  History of Present Illness Shannon AmenYvonne M Scicchitano was admitted on 06/27/2015 with an acute CVA.  PMHx is + for HTN, HLD, DM, hypothyroidism, + smoker, anxiety, dementia, pancreatitis. They first developed weakness, numbness, double vision and speech difficulty and an observed leftward gaze while at home after getting back from grocery shopping.  Imaging demonstrated Patchy right cerebellar infarcts embolic secondary to unknown source.  she has undergone workup for stroke including echocardiogram and carotid angio.  The patient has been monitored on telemetry which has demonstrated sinus rhythm with no arrhythmias.  Inpatient stroke work-up is to be completed with a TEE this morning.   Echocardiogram this admission demonstrated   Study Conclusions - Left ventricle: The cavity size was normal. There was moderate  concentric hypertrophy. Systolic function was normal. The  estimated ejection fraction was in the range of 60% to 65%. Wall  motion was normal; there were no regional wall motion  abnormalities. There was an increased relative contribution of  atrial contraction to ventricular filling. Doppler parameters are  consistent with abnormal left ventricular relaxation (grade 1  diastolic dysfunction). - Mitral valve: Calcified annulus. - Tricuspid valve: There was trivial regurgitation.    Lab work is reviewed.  Prior to admission, the patient mentions to me that she has dementia and doesn't trust her memory but denies chest pain, shortness of breath, dizziness, palpitations, or syncope.  They are recovering from their stroke with plans to SNF  at discharge.  EP has been asked to evaluate for placement of an implantable loop recorder to monitor for atrial fibrillation.     Past Medical History  Diagnosis Date  . Hypertension   . Diabetes mellitus   . Pancreatitis 2010  . Idiopathic acute pancreatitis 09/16/2011  . Trigger finger   . Hypothyroidism   . Dementia   . Memory loss   . Tobacco abuse      Surgical History:  Past Surgical History  Procedure Laterality Date  . Cholecystectomy  2003  . Spine surgery  2006    Dr. Trey SailorsMark Roy - L3-L4, L4-L5 laminotomy and foraminotomy  . Back surgery    . Cyst removal hand Left 12/11/2014    Procedure: LEFT RING FINGER TENDON SHEATH CYST EXCISION;  Surgeon: Mack Hookavid Thompson, MD;  Location: [facility];  Service: Orthopedics;  Laterality: Left;     Prescriptions prior to admission  Medication Sig Dispense Refill Last Dose  . acetaminophen (TYLENOL) 325 MG tablet Take 325 mg by mouth every 6 (six) hours as needed for mild pain.    Past Month at Unknown time  . ALPRAZolam (XANAX) 0.5 MG tablet Take 0.5 mg by mouth daily.    06/27/2015 at Unknown time  . amLODipine (NORVASC) 5 MG tablet Take 5 mg by mouth daily.   06/27/2015 at Unknown time  . aspirin EC 81 MG tablet Take 81 mg by mouth daily.     06/27/2015 at Unknown time  . atorvastatin (LIPITOR) 40 MG tablet Take 40 mg by mouth daily.   06/27/2015 at Unknown time  . CALCIUM PO Take 1 tablet by mouth daily.   06/27/2015 at Unknown time  . donepezil (ARICEPT) 10 MG tablet Take 1 tablet (10 mg  total) by mouth at bedtime. 30 tablet 5 Past Week at Unknown time  . HYDROcodone-acetaminophen (NORCO/VICODIN) 5-325 MG per tablet Take 1 tablet by mouth every 6 (six) hours as needed for moderate pain.    Past Month at Unknown time  . levothyroxine (SYNTHROID, LEVOTHROID) 50 MCG tablet Take 1 tablet (50 mcg total) by mouth daily. 90 tablet 1 06/27/2015 at Unknown time  . lisinopril (PRINIVIL,ZESTRIL) 20 MG tablet TAKE 1 TABLET (20 MG  TOTAL) BY MOUTH DAILY. 90 tablet 0 06/27/2015 at Unknown time  . meloxicam (MOBIC) 15 MG tablet Take 15 mg by mouth daily.    06/27/2015 at Unknown time  . metFORMIN (GLUCOPHAGE) 500 MG tablet TAKE ONE TABLET BY MOUTH ONCE DAILY 30 tablet 1 06/27/2015 at Unknown time  . mirtazapine (REMERON) 15 MG tablet Take 15 mg by mouth daily as needed (depressive disorder).   Past Week at Unknown time  . multivitamin-iron-minerals-folic acid (CENTRUM) chewable tablet Chew 1 tablet by mouth daily.   06/27/2015 at Unknown time  . sertraline (ZOLOFT) 100 MG tablet Take 1 tablet (100 mg total) by mouth 2 (two) times daily. 60 tablet 2 06/27/2015 at Unknown time  . traMADol (ULTRAM) 50 MG tablet Take 50 mg by mouth every 6 (six) hours as needed for moderate pain.    Past Month at Unknown time    Inpatient Medications:  .  stroke: mapping our early stages of recovery book   Does not apply Once  . ALPRAZolam  0.5 mg Oral Daily  . amLODipine  5 mg Oral Daily  . aspirin EC  81 mg Oral Daily  . atorvastatin  40 mg Oral Daily  . calcium carbonate  1,250 mg Oral Q breakfast  . clopidogrel  75 mg Oral Daily  . donepezil  10 mg Oral QHS  . enoxaparin (LOVENOX) injection  40 mg Subcutaneous Daily  . feeding supplement (GLUCERNA SHAKE)  237 mL Oral TID BM  . insulin aspart  0-5 Units Subcutaneous QHS  . insulin aspart  0-9 Units Subcutaneous TID WC  . levothyroxine  50 mcg Oral QAC breakfast  . multivitamin with minerals  1 tablet Oral Daily  . nicotine  21 mg Transdermal Daily  . sertraline  100 mg Oral BID    Allergies: No Known Allergies  Social History   Social History  . Marital Status: Single    Spouse Name: N/A  . Number of Children: N/A  . Years of Education: N/A   Occupational History  . Not on file.   Social History Main Topics  . Smoking status: Current Every Day Smoker -- 0.50 packs/day    Types: Cigarettes  . Smokeless tobacco: Never Used     Comment: still in stress. looking for job.   .  Alcohol Use: No  . Drug Use: No  . Sexual Activity: Not on file   Other Topics Concern  . Not on file   Social History Narrative   Lives with boyfriend.  No children.  Retired from [employer].  Education: 2 years of college.     Family History  Problem Relation Age of Onset  . Cancer Mother   . Alcohol abuse Father       Review of Systems: All other systems reviewed and are otherwise negative except as noted above.  Physical Exam: Filed Vitals:   07/01/15 1750 07/01/15 2050 07/02/15 0136 07/02/15 0623  BP: 143/91 142/94 154/75 136/91  Pulse: 73 70 64 66  Temp: 98.5 F (36.9  C) 99.3 F (37.4 C) 98.4 F (36.9 C) 98.1 F (36.7 C)  TempSrc: Oral Oral Oral Oral  Resp: 20 20 18 18   SpO2: 98% 93% 93% 97%    GEN- The patient is well appearing, alert and oriented x 3 today.  The patient carries a diagnosis of dementia, she appears to understand well our conversation and asks appropriate questions.  She knows where she is and why she is here. Head- normocephalic, atraumatic Eyes-  Sclera clear, conjunctiva pink Ears- hearing intact Oropharynx- clear Neck- supple Lungs- Clear to ausculation bilaterally, normal work of breathing Heart- Regular rate and rhythm, no murmurs, rubs or gallops  GI- soft, NT, ND Extremities- no clubbing, cyanosis, or edema MS- no significant deformity or atrophy Skin- no rash or lesion Psych- euthymic mood, full affect   Labs:   Lab Results  Component Value Date   WBC 9.1 06/27/2015   HGB 14.6 06/27/2015   HCT 43.0 06/27/2015   MCV 84.3 06/27/2015   PLT 183 06/27/2015    Recent Labs Lab 06/28/15 0148  06/29/15 0602  NA  --   --  136  K  --   < > 4.8  CL  --   --  108  CO2  --   --  24  BUN  --   --  12  CREATININE  --   --  1.00  CALCIUM  --   --  9.7  PROT 7.4  --   --   BILITOT 0.4  --   --   ALKPHOS 77  --   --   ALT 21  --   --   AST 30  --   --   GLUCOSE  --   --  105*  < > = values in this interval not  displayed. Lab Results  Component Value Date   TROPONINI <0.03 06/28/2015   Lab Results  Component Value Date   CHOL 109 06/28/2015   CHOL 111 07/03/2014   CHOL 120 09/24/2011   Lab Results  Component Value Date   HDL 39* 06/28/2015   HDL 43.20 07/03/2014   HDL 41 09/24/2011   Lab Results  Component Value Date   LDLCALC 56 06/28/2015   LDLCALC 56 07/03/2014   LDLCALC 56 09/24/2011   Lab Results  Component Value Date   TRIG 69 06/28/2015   TRIG 58.0 07/03/2014   TRIG 116 09/24/2011   Lab Results  Component Value Date   CHOLHDL 2.8 06/28/2015   CHOLHDL 3 07/03/2014   CHOLHDL 2.9 09/24/2011   No results found for: LDLDIRECT  Lab Results  Component Value Date   DDIMER 0.35 06/27/2015     Radiology/Studies:  Ct Angio Head W/cm &/or Wo Cm 06/28/2015  CLINICAL DATA:  65 year old female with acute right cerebellar infarcts, SCA territory, discovered during evaluation for acute dizziness. Initial encounter. EXAM: CT ANGIOGRAPHY HEAD AND NECK TECHNIQUE: Multidetector CT imaging of the head and neck was performed using the standard protocol during bolus administration of intravenous contrast. Multiplanar CT image reconstructions and MIPs were obtained to evaluate the vascular anatomy. Carotid stenosis measurements (when applicable) are obtained utilizing NASCET criteria, using the distal internal carotid diameter as the denominator. CONTRAST:  50 mL Isovue 370 COMPARISON:  Brain MRI 0320 hours today, and earlier. FINDINGS: CTA NECK Skeleton: Widespread cervical spine degeneration. No acute osseous abnormality identified. Visualized paranasal sinuses and mastoids are clear. Other neck: Centrilobular emphysema and dependent opacity in the lung apices, favor the  latter due to atelectasis. No superior mediastinal lymphadenopathy. Negative thyroid, larynx, pharynx, parapharyngeal spaces, sublingual space, submandibular glands and parotid glands. Retropharyngeal spaces are normal aside from a  retropharyngeal course of both carotids, especially the left. No cervical lymphadenopathy. Aortic arch: Bovine type arch configuration. Tortuous proximal great vessels. Mild to moderate arch and proximal great vessel calcified atherosclerosis. Right carotid system: Tortuous brachiocephalic artery with a kinked appearance (coronal image 65). Normal right CCA origin. Partially retropharyngeal course of the right CCA. Negative right carotid bifurcation. Minimal calcified plaque right ICA bulb with no stenosis. Tortuous cervical right ICA with a kinked appearance at the C1 level (series 407, image 20). Left carotid system: No left CCA origin stenosis. Tortuous proximal left CCA. Retropharyngeal course of the left CCA and left ICA. Negative left carotid bifurcation. Mildly tortuous cervical left ICA. Vertebral arteries:No proximal right subclavian artery stenosis. Normal right vertebral artery origin. Normal right vertebral artery to the skullbase. No proximal left subclavian artery stenosis. Mild calcified plaque near the left vertebral artery origin with no stenosis. Tortuous left V1 segment. Negative left vertebral artery otherwise to the skullbase. CTA HEAD Posterior circulation: Early origin of the left PICA which otherwise appears normal. The right PICA is diminutive or absent. No distal vertebral artery stenosis. Normal vertebrobasilar junction. Normal right AICA origin. No basilar stenosis. There appears to be a shared origin of the right PCA and right SCA. The right SCA is faintly enhancing on series 404, image 102 - but is then quickly attenuated. Normal left SCA. Normal bilateral PCA origins otherwise. Posterior communicating arteries are diminutive or absent. Normal PCA branches. Anterior circulation: Both ICA siphons are patent. There is mild to moderate calcified plaque primarily affecting the cavernous segments and greater on the left. There is moderate left cavernous segment stenosis as seen on series  404, image 120. No hemodynamically significant stenosis on the right. Normal ophthalmic artery origins. Normal carotid termini. Normal MCA and ACA origins. Mild irregularity and stenosis of the right ACA A1 segment. Normal left A1. Anterior communicating artery and bilateral ACA branches are within normal limits. Left MCA M1 segment, bifurcation, and left MCA branches are within normal limits. Right MCA M1 segment, bifurcation, and right MCA branches are within normal limits. Venous sinuses: Superior sagittal, transverse, and sigmoid sinuses are patent. Anatomic variants: Bovine type aortic arch configuration. IMPRESSION: 1. Positive for Poor flow in the Right SCA, concordant with the acute superior right cerebellar infarcts demonstrated this morning. 2. Negative for emergent large vessel occlusion, and the posterior circulation is otherwise negative. 3. Anterior circulation remarkable for tortuous carotid arteries with left greater than right retropharyngeal course, and calcified ICA siphon atherosclerosis resulting in Moderate Left ICA cavernous segment Stenosis. 4. Emphysema and atelectasis in the lung apices. Electronically Signed   By: Odessa Fleming M.D.   On: 06/28/2015 13:49   Ct Head Wo Contrast 06/27/2015  CLINICAL DATA:  Code stroke. Left-sided gaze, headache and weakness. Initial encounter. EXAM: CT HEAD WITHOUT CONTRAST TECHNIQUE: Contiguous axial images were obtained from the base of the skull through the vertex without intravenous contrast. COMPARISON:  MRI of the brain performed 02/22/2014 FINDINGS: There is no evidence of acute infarction, mass lesion, or intra- or extra-axial hemorrhage on CT. Prominence of the ventricles and sulci reflects mild cortical volume loss. Mild periventricular white matter change likely reflects small vessel ischemic microangiopathy. A small chronic lacunar infarct is noted at the right cerebellar hemisphere. The brainstem and fourth ventricle are within normal limits. The  basal ganglia are unremarkable in appearance. The cerebral hemispheres demonstrate grossly normal gray-white differentiation. No mass effect or midline shift is seen. There is no evidence of fracture; visualized osseous structures are unremarkable in appearance. The orbits are within normal limits. The paranasal sinuses and mastoid air cells are well-aerated. No significant soft tissue abnormalities are seen. IMPRESSION: 1. No acute intracranial pathology seen on CT. 2. Mild cortical volume loss and scattered small vessel ischemic microangiopathy. 3. Small chronic lacunar infarct at the right cerebellar hemisphere. These results were called by telephone at the time of interpretation on 06/27/2015 at 9:37 pm to Dr. Roseanne Reno, who verbally acknowledged these results. Electronically Signed   By: Roanna Raider M.D.   On: 06/27/2015 21:37    Mr Brain Wo Contrast 06/28/2015  CLINICAL DATA:  Initial valuation for acute dizziness. History of hypertension, hyperlipidemia, diabetes. EXAM: MRI HEAD WITHOUT CONTRAST TECHNIQUE: Multiplanar, multiecho pulse sequences of the brain and surrounding structures were obtained without intravenous contrast. COMPARISON:  Prior CT from 06/27/2015. FINDINGS: Mildly advanced cerebral atrophy for patient age. Patchy and confluent T2/FLAIR hyperintensity within the periventricular white matter most like related chronic small vessel ischemic disease. Chronic small vessel ischemic changes present within the pons as well. This is fairly mild nature. Remote lacunar infarct within the left caudate body. Probable additional small remote cortical infarct within the left occipital region (series 7, image 11). Probable additional small remote lacunar infarcts within the bilateral cerebellar hemispheres. Patchy restricted diffusion present within the superior aspect of the right cerebellar hemisphere near the middle cerebellar peduncle (series 4, image 14). This is positioned adjacent to the fourth  ventricle with involvement of the vermis. No associated mass effect or hemorrhage. No other acute infarct. Gray-white matter differentiation otherwise maintained. Major intracranial vascular flow voids are preserved. No acute or chronic intracranial hemorrhage. No mass lesion, midline shift, or mass effect. No hydrocephalus. No extra-axial fluid collection. Major dural sinuses are grossly patent. Craniocervical junction within normal limits. Mild multilevel degenerative spondylolysis noted within the visualized upper cervical spine without significant stenosis. Incidental note made of an empty sella. No acute abnormality about the orbits. Mild mucosal thickening within the paranasal sinuses. No air-fluid levels to suggest active sinus infection. No mastoid effusion. Inner ear structures grossly normal. Bone marrow signal intensity within normal limits. No scalp soft tissue abnormality. IMPRESSION: 1. Small patchy acute right cerebellar infarcts as above. No associated hemorrhage or mass effect. 2. Remote lacunar infarcts involving the left caudate and bilateral cerebellar hemispheres, with additional small remote cortical infarct within the left occipital lobe. 3. Mild atrophy with chronic small vessel ischemic disease. Electronically Signed   By: Rise Mu M.D.   On: 06/28/2015 04:59    12-lead ECG SR,  T changes All prior EKG's in EPIC reviewed with no documented atrial fibrillation Telemetry SR only  Assessment and Plan:  1. Cryptogenic stroke The patient presents with cryptogenic stroke.  The patient has a TEE planned for this AM.  I spoke at length with the patient about monitoring for afib with either a 30 day event monitor or an implantable loop recorder.  Risks, benefits, and alteratives to implantable loop recorder were discussed with the patient today.   At this time, the patient is very clear in their decision to proceed with implantable loop recorder.   Wound care was reviewed with  the patient (keep incision clean and dry for 3 days).  Wound check scheduled for 07/12/15.  Please call with questions.   Renee  Norberto Sorenson, PA-C 07/02/2015  EP Attending Agree with above. I will see her formally if she has no afib on 30 day monitor.  Leonia Reeves.D.

## 2015-07-02 NOTE — Progress Notes (Signed)
Occupational Therapy Treatment Patient Details Name: Shannon AmenYvonne M Reid MRN: [id number] DOB: [birth year] Today's Date: 07/02/2015    History of present illness Pt is a 65 year old female admitted for dizziness, L gaze, slow speech, diaphoresis, and nausea vomiting. CT revealed small lacunar infact R cerebellum (embolic occlusion of the right superior cerebellar artery) PMH significant for HTN, DM and early on set Alzheimer's   OT comments  Pt ambulated without RW and participated in ADL tasks. Continue to recommend SNF. Feel pt will continue to benefit from acute OT to increase independence prior to d/c.   Follow Up Recommendations  SNF    Equipment Recommendations  3 in 1 bedside comode    Recommendations for Other Services      Precautions / Restrictions Precautions Precautions: Fall Restrictions Weight Bearing Restrictions: No       Mobility Bed Mobility Overal bed mobility: Needs Assistance Bed Mobility: Supine to Sit;Sit to Supine     Supine to sit: Modified independent (Device/Increase time) Sit to supine: Min assist   General bed mobility comments: assist with LE when returning to bed. Cue for scooting HOB.   Transfers Overall transfer level: Needs assistance Equipment used: None Transfers: Sit to/from Stand Sit to Stand: Min guard         General transfer comment: Min guard for safety. Stood from EOB x4. Unsteady in standing holding onto bed for support.    Balance Assist for balance with ambulation.        ADL Overall ADL's : Needs assistance/impaired     Grooming: Wash/dry face;Standing;Applying deodorant;Minimal assistance Grooming Details (indicate cue type and reason): Min A to ambulate to get washcloths; stood at sink and performed at supervision level Upper Body Bathing: Minimal assitance;Standing Upper Body Bathing Details (indicate cue type and reason): washed under arms; Min A to ambulate to retrieve waschcloths; Supervision to wash under arms  at sink  Lower Body Bathing: Minimal assistance (standing at sink) Lower Body Bathing Details (indicate cue type and reason): did not wash lower legs or feet; Min A to ambulate up to closet to gather washcloths; Min guard standing at sink while washing LB     Lower Body Dressing: Sitting/lateral leans;Supervision/safety;Set up Lower Body Dressing Details (indicate cue type and reason): donned/doffed socks Toilet Transfer: Minimal assistance;Ambulation (sit to stand from bed)     Toileting - Clothing Manipulation Details (indicate cue type and reason): Pt managed gown in session, but OT also helped in managing and holding gown while pt washed LB.      Functional mobility during ADLs: Minimal assistance        Vision                     Perception     Praxis      Cognition  Awake/Alert Behavior During Therapy: WFL for tasks assessed/performed Overall Cognitive Status: History of cognitive impairments - at baseline                    Extremity/Trunk Assessment               Exercises     Shoulder Instructions       General Comments      Pertinent Vitals/ Pain       Pain Assessment: 0-10 Pain Score: 5  Pain Location: back Pain Descriptors / Indicators: Tender Pain Intervention(s): Monitored during session;Limited activity within patient's tolerance  Home Living  Prior Functioning/Environment              Frequency Min 2X/week     Progress Toward Goals  OT Goals(current goals can now be found in the care plan section)  Progress towards OT goals: Progressing toward goals  Acute Rehab OT Goals Patient Stated Goal: not stated OT Goal Formulation: With patient Time For Goal Achievement: 07/13/15 Potential to Achieve Goals: Good ADL Goals Pt Will Perform Lower Body Bathing: with min guard assist;sit to/from stand (cues for sequencing and safety for all adls) Pt Will Perform  Lower Body Dressing: with min guard assist;sit to/from stand Pt Will Transfer to Toilet: with min guard assist;ambulating;bedside commode Pt Will Perform Toileting - Clothing Manipulation and hygiene: with min guard assist;sit to/from stand Pt Will Perform Tub/Shower Transfer: Tub transfer;tub bench;with min guard assist;ambulating  Plan Discharge plan remains appropriate;Frequency needs to be updated    Co-evaluation                 End of Session Equipment Utilized During Treatment: Gait belt   Activity Tolerance Patient tolerated treatment well;Patient limited by pain   Patient Left in bed;with call bell/phone within reach;with bed alarm set   Nurse Communication          Time: 1610-9604 OT Time Calculation (min): 20 min  Charges: OT General Charges $OT Visit: 1 Procedure OT Treatments $Self Care/Home Management : 8-22 mins  Earlie Raveling OTR/L [phone] 07/02/2015, 2:58 PM

## 2015-07-02 NOTE — Clinical Social Work Placement (Signed)
   CLINICAL SOCIAL WORK PLACEMENT  NOTE  Date:  07/02/2015  Patient Details  Name: [name] MRN: [id number] Date of Birth: 18-Apr-1950  Clinical Social Work is seeking post-discharge placement for this patient at the Skilled  Nursing Facility level of care (*CSW will initial, date and re-position this form in  chart as items are completed):      Patient/family provided with [facility] Clinical Social Work Department's list of facilities offering this level of care within the geographic area requested by the patient (or if unable, by the patient's family).      Patient/family informed of their freedom to choose among providers that offer the needed level of care, that participate in Medicare, Medicaid or managed care program needed by the patient, have an available bed and are willing to accept the patient.      Patient/family informed of [place]'s ownership interest in [facility] and [facility], as well as of the fact that they are under no obligation to receive care at these facilities.  PASRR submitted to EDS on 07/02/15     PASRR number received on 07/02/15     Existing PASRR number confirmed on       FL2 transmitted to all facilities in geographic area requested by pt/family on 07/02/15     FL2 transmitted to all facilities within larger geographic area on       Patient informed that his/her managed care company has contracts with or will negotiate with certain facilities, including the following:            Patient/family informed of bed offers received.  Patient chooses bed at       Physician recommends and patient chooses bed at      Patient to be transferred to   on  .  Patient to be transferred to facility by       Patient family notified on   of transfer.  Name of family member notified:        PHYSICIAN Please sign FL2     Additional Comment:    _______________________________________________ Mearl LatinNadia S Taleigh Gero, LCSWA 07/02/2015, 1:07 PM

## 2015-07-03 ENCOUNTER — Encounter (HOSPITAL_COMMUNITY): Payer: Self-pay | Admitting: *Deleted

## 2015-07-03 ENCOUNTER — Inpatient Hospital Stay (HOSPITAL_COMMUNITY): Payer: Medicare Other

## 2015-07-03 ENCOUNTER — Encounter (HOSPITAL_COMMUNITY)
Admission: EM | Disposition: A | Discharge status: Skilled Nursing Facility | Payer: Self-pay | Source: Home / Self Care | Attending: Internal Medicine

## 2015-07-03 ENCOUNTER — Other Ambulatory Visit: Payer: Self-pay | Admitting: Physician Assistant

## 2015-07-03 DIAGNOSIS — R42 Dizziness and giddiness: Secondary | ICD-10-CM | POA: Insufficient documentation

## 2015-07-03 DIAGNOSIS — I639 Cerebral infarction, unspecified: Secondary | ICD-10-CM

## 2015-07-03 DIAGNOSIS — E1159 Type 2 diabetes mellitus with other circulatory complications: Secondary | ICD-10-CM

## 2015-07-03 HISTORY — PX: TEE WITHOUT CARDIOVERSION: SHX5443

## 2015-07-03 LAB — BASIC METABOLIC PANEL
Anion gap: 10 (ref 5–15)
BUN: 19 mg/dL (ref 6–20)
CALCIUM: 9.6 mg/dL (ref 8.9–10.3)
CHLORIDE: 102 mmol/L (ref 101–111)
CO2: 25 mmol/L (ref 22–32)
CREATININE: 1.1 mg/dL — AB (ref 0.44–1.00)
GFR calc Af Amer: >60 mL/min (ref >60)
GFR calc non Af Amer: 52 mL/min — ABNORMAL LOW (ref >60)
Glucose, Bld: 124 mg/dL — ABNORMAL HIGH (ref 65–99)
Potassium: 3.7 mmol/L (ref 3.5–5.1)
SODIUM: 137 mmol/L (ref 135–145)

## 2015-07-03 LAB — GLUCOSE, CAPILLARY
GLUCOSE-CAPILLARY: 111 mg/dL — AB (ref 65–99)
GLUCOSE-CAPILLARY: 144 mg/dL — AB (ref 65–99)
Glucose-Capillary: 119 mg/dL — ABNORMAL HIGH (ref 65–99)

## 2015-07-03 LAB — SURGICAL PCR SCREEN
MRSA, PCR: NEGATIVE
Staphylococcus aureus: NEGATIVE

## 2015-07-03 SURGERY — ECHOCARDIOGRAM, TRANSESOPHAGEAL
Anesthesia: Moderate Sedation

## 2015-07-03 MED ORDER — TRAMADOL HCL 50 MG PO TABS: 50.0000 mg | ORAL_TABLET | Freq: Three times a day (TID) | ORAL | Status: DC | PRN

## 2015-07-03 MED ORDER — FENTANYL CITRATE (PF) 100 MCG/2ML IJ SOLN
INTRAMUSCULAR | Status: AC
  Filled 2015-07-03: qty 2

## 2015-07-03 MED ORDER — MIDAZOLAM HCL 10 MG/2ML IJ SOLN
INTRAMUSCULAR | Status: DC | PRN
  Administered 2015-07-03 (×2): 2 mg via INTRAVENOUS

## 2015-07-03 MED ORDER — MIDAZOLAM HCL 5 MG/ML IJ SOLN
INTRAMUSCULAR | Status: AC
  Filled 2015-07-03: qty 2

## 2015-07-03 MED ORDER — HYDROCODONE-ACETAMINOPHEN 5-325 MG PO TABS: 1.0000 | ORAL_TABLET | Freq: Four times a day (QID) | ORAL | Status: DC | PRN

## 2015-07-03 MED ORDER — NICOTINE 21 MG/24HR TD PT24: 21.0000 mg | MEDICATED_PATCH | Freq: Every day | TRANSDERMAL | Status: DC

## 2015-07-03 MED ORDER — ALPRAZOLAM 0.5 MG PO TABS: 0.5000 mg | ORAL_TABLET | Freq: Two times a day (BID) | ORAL | Status: DC | PRN

## 2015-07-03 MED ORDER — ASPIRIN EC 325 MG PO TBEC
325.0000 mg | DELAYED_RELEASE_TABLET | Freq: Every day | ORAL | Status: DC
Start: 2015-07-03 — End: 2016-07-31

## 2015-07-03 MED ORDER — FENTANYL CITRATE (PF) 100 MCG/2ML IJ SOLN
INTRAMUSCULAR | Status: DC | PRN
  Administered 2015-07-03 (×2): 25 ug via INTRAVENOUS

## 2015-07-03 MED ORDER — GLUCERNA SHAKE PO LIQD: 237.0000 mL | Freq: Two times a day (BID) | ORAL | Status: DC

## 2015-07-03 MED ORDER — SODIUM CHLORIDE 0.9 % IV SOLN
INTRAVENOUS | Status: DC
  Administered 2015-07-03: 500 mL via INTRAVENOUS

## 2015-07-03 MED ORDER — MECLIZINE HCL 12.5 MG PO TABS: 12.5000 mg | ORAL_TABLET | Freq: Three times a day (TID) | ORAL | Status: DC | PRN

## 2015-07-03 MED ORDER — BUTAMBEN-TETRACAINE-BENZOCAINE 2-2-14 % EX AERO
INHALATION_SPRAY | CUTANEOUS | Status: DC | PRN
  Administered 2015-07-03: 2 via TOPICAL

## 2015-07-03 MED ORDER — WARFARIN SODIUM 5 MG PO TABS: 5.0000 mg | ORAL_TABLET | Freq: Every day | ORAL | Status: DC

## 2015-07-03 NOTE — Interval H&P Note (Signed)
History and Physical Interval Note:  07/03/2015 12:00 PM  [name]  has presented today for surgery, with the diagnosis of stroke  The various methods of treatment have been discussed with the patient and family. After consideration of risks, benefits and other options for treatment, the patient has consented to  Procedure(s): TRANSESOPHAGEAL ECHOCARDIOGRAM (TEE) (N/A) as a surgical intervention .  The patient's history has been reviewed, patient examined, no change in status, stable for surgery.  I have reviewed the patient's chart and labs.  Questions were answered to the patient's satisfaction.     Armanda Magicraci Mylissa Lambe

## 2015-07-03 NOTE — Progress Notes (Signed)
Patient being discharged per order. Patient aware of discharge to SNF. IV removed, tele removed. Report given to Olu at [facility], notified that coumadin will need to be given tonight.

## 2015-07-03 NOTE — Progress Notes (Signed)
Patient will DC to: [facility] Anticipated DC date: 07/03/15 Family notified: Sister Transport by: Sharin MonsPTAR   Per MD patient ready for DC to [facility]. RN, patient, patient's family, and facility notified of DC. RN given number for report. DC packet on chart. Ambulance transport requested for patient.   CSW signing off.  Cristobal GoldmannNadia Angeleah Labrake, [place] Clinical Social Worker [phone]

## 2015-07-03 NOTE — Progress Notes (Signed)
STROKE TEAM PROGRESS NOTE   SUBJECTIVE (INTERVAL HISTORY) No family at bedside. 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 She is recommended anticoagulation for stroke prevention. If patient has another CVA on anticoagluation may need CVTS evaluation.   OBJECTIVE Temp:  [97.8 F (36.6 C)-98.3 F (36.8 C)] 97.8 F (36.6 C) (06/06 1437) Pulse Rate:  [59-69] 66 (06/06 1437) Cardiac Rhythm:  [-] Normal sinus rhythm (06/06 1240) Resp:  [14-21] 17 (06/06 1437) BP: (122-155)/(68-91) 142/91 mmHg (06/06 1437) SpO2:  [91 %-99 %] 95 % (06/06 1437) Weight:  [198 lb 6.6 oz (90 kg)] 198 lb 6.6 oz (90 kg) (06/06 1515)  CBC:   Recent Labs Lab 06/27/15 2122 06/27/15 2134  WBC 9.1  --   NEUTROABS 4.4  --   HGB 13.1 14.6  HCT 39.7 43.0  MCV 84.3  --   PLT 183  --    Basic Metabolic Panel:   Recent Labs Lab 06/28/15 0148  06/29/15 0602 07/03/15 0733  NA  --   --  136 137  K  --   < > 4.8 3.7  CL  --   --  108 102  CO2  --   --  24 25  GLUCOSE  --   --  105* 124*  BUN  --   --  12 19  CREATININE  --   --  1.00 1.10*  CALCIUM  --   --  9.7 9.6  MG 2.1  --  1.6*  --   < > = values in this interval not displayed. Lipid Panel:     Component Value Date/Time   CHOL 109 06/28/2015 0148   TRIG 69 06/28/2015 0148   HDL 39* 06/28/2015 0148   CHOLHDL 2.8 06/28/2015 0148   VLDL 14 06/28/2015 0148   LDLCALC 56 06/28/2015 0148   HgbA1c:  Lab Results  Component Value Date   HGBA1C 6.4* 06/28/2015   Urine Drug Screen:     Component Value Date/Time   LABOPIA NONE DETECTED 06/28/2015 0006   COCAINSCRNUR NONE DETECTED 06/28/2015 0006   LABBENZ NONE DETECTED 06/28/2015 0006   AMPHETMU NONE DETECTED 06/28/2015 0006   THCU NONE DETECTED 06/28/2015 0006   LABBARB NONE DETECTED 06/28/2015 0006    IMAGING I have personally reviewed the radiological images below and agree with the radiology interpretations.  CT angiogram of  the head and neck 06/28/2015 1. Positive for Poor flow in the Right SCA, concordant with the acute superior right cerebellar infarcts demonstrated this morning. 2. Negative for emergent large vessel occlusion, and the posterior circulation is otherwise negative. 3. Anterior circulation remarkable for tortuous carotid arteries with left greater than right retropharyngeal course, and calcified ICA siphon atherosclerosis resulting in Moderate Left ICA cavernous segment stenosis. 4. Emphysema and atelectasis in the lung apices.  MRI brain without contrast 06/28/2015 1. Small patchy acute right cerebellar infarcts as above. No associated hemorrhage or mass effect. 2. Remote lacunar infarcts involving the left caudate and bilateral cerebellar hemispheres, with additional small remote cortical infarct within the left occipital lobe. 3. Mild atrophy with chronic small vessel ischemic disease.  EEG Unremarkable awake and drowsy routine inpatient EEG.  2D Echocardiogram  - Left ventricle: The cavity size was normal. There was moderate concentric hypertrophy. Systolic function was normal. The estimated ejection fraction was in the range of 60% to 65%. Wall motion was normal; there were no regional wall motion abnormalities. There was an  increased relative contribution of atrial contraction to ventricular filling. Doppler parameters are consistent with abnormal left ventricular relaxation (grade 1 diastolic dysfunction). - Mitral valve: Calcified annulus. - Tricuspid valve: There was trivial regurgitation.   LE venous doppler No evidence of DVT or baker's cyst.  TEE  Normal LV size and function Normal RV size and function Normal RA Normal LA and LA appendage with no evidence of thrombus Normal TV with trivial TR Normal PV with trivial PR Mildly calcified anterior MV leaflet with mild mitral annular calcification and trivial MR. Trileaflet AV with trivial AR. There is a thin mobile central strand  seen at the coaptation of the AV leaflets in the 135 degree view on the ventricular side that most likely represents Lambl's excrescences which could be etiology of patient's CVA.  Lipomatous interatrial septum with no evidence of shunt by colorflow dopper or agitated saline contrast injection Normal thoracic and ascending aorta.  Recommend anticoagluation for Lambl's excruscenes. If patient has another CVA on anticoagluation may need CVTS evaluation.   PHYSICAL EXAM Pleasant middle-aged African-American lady currently not in distress. . Afebrile. Head is nontraumatic. Neck is supple without bruit.   Cardiac exam no murmur or gallop. Lungs are clear to auscultation. Distal pulses are well felt. Neurological Exam :  Awake alert oriented 3 with normal speech and language function. No aphasia or apraxia dysarthria. Extraocular movements are full range without nystagmus. Mild saccadic dysmetria on lateral gaze bilaterally. Pupils equal reactive. Vision acuity is adequate. Fundi were not visualized. Face is symmetric without weakness. Tongue is midline. Motor system exam reveals symmetric upper and lower extremity strength. Mild diminished fine finger movements on the right. No finger-to-nose and knee to heel ataxia or dysmetria present. Normal sensation. Deep tendon reflexes are symmetric. Plantars are downgoing. Gait was not tested.   ASSESSMENT/PLAN [name] is a 65 y.o. female with history of hypertension, diabetes mellitus, pancreatitis, hypothyroidism and dementia presenting with diaphoresis, nausea, vomiting, SOB, and slowed speech. She did not receive IV t-PA.   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   2D Echo - EF 60-65%. No source of embolus   EEG Unremarkable    LE venous doppler negative   TEE - Lambl's excruscenes  LDL 56    HgbA1c 6.4  Lovenox 40 mg sq daily for VTE prophylaxis Diet - low sodium heart healthy  aspirin 81 mg daily prior to admission, now on ASA and Plavix. Recommend to start coumadin for anticoagulation. Once INR 2-3, stop ASA and plavix.   Patient counseled to be compliant with her antithrombotic medications  Ongoing stroke risk factor control  Therapy recommendations: SNF  Disposition: SNF  Hypertension  Stable  Permissive hypertension (OK if < 220/120) but gradually normalize in 5-7 days  Hyperlipidemia  Home meds:  lipitor 40, resumed in hospital  LDL 56, goal < 70  Continue statin at discharge  Diabetes type II  HgbA1c 6.4, goal < 7.0  Controlled  SSI  Tobacco abuse  Current smoker  Smoking cessation counseling provided  Nicotine patch provided  Pt is willing to quit  Other Stroke Risk Factors  Advanced age  Other Active Problems  Baseline dementia - early onset Alzheimer's dementia without behavioral disturbance  Depression & anxiety  Hypokalemia - resolved -  potassium now 4.8  AKI - resolved - BUN 12 - creatinine 1.0  Protein calorie malnutrition  Hospital day # 5   Neurology will sign off. Please call with questions. Pt will follow up with Dr. Roda Shutters at [facility] in about 2 months. Thanks for the consult.  Marvel Plan, MD PhD Stroke Neurology 07/03/2015 10:26 PM     To contact Stroke Continuity provider, please refer to [email or web address]. After hours, contact General Neurology

## 2015-07-03 NOTE — Clinical Social Work Placement (Signed)
   CLINICAL SOCIAL WORK PLACEMENT  NOTE  Date:  07/03/2015  Patient Details  Name: [name] MRN: [id number] Date of Birth: 04-Aug-1950  Clinical Social Work is seeking post-discharge placement for this patient at the Skilled  Nursing Facility level of care (*CSW will initial, date and re-position this form in  chart as items are completed):  Yes   Patient/family provided with [facility] Clinical Social Work Department's list of facilities offering this level of care within the geographic area requested by the patient (or if unable, by the patient's family).  Yes   Patient/family informed of their freedom to choose among providers that offer the needed level of care, that participate in Medicare, Medicaid or managed care program needed by the patient, have an available bed and are willing to accept the patient.  Yes   Patient/family informed of [place]'s ownership interest in [facility] and [facility], as well as of the fact that they are under no obligation to receive care at these facilities.  PASRR submitted to EDS on 07/02/15     PASRR number received on 07/02/15     Existing PASRR number confirmed on       FL2 transmitted to all facilities in geographic area requested by pt/family on 07/02/15     FL2 transmitted to all facilities within larger geographic area on       Patient informed that his/her managed care company has contracts with or will negotiate with certain facilities, including the following:        Yes   Patient/family informed of bed offers received.  Patient [facility] bed at [facility]     Physician recommends and patient chooses bed at      Patient to be transferred to [facility] on 07/03/15.  Patient to be transferred to facility by PTAR     Patient family notified on 07/03/15 of transfer.  Name of family member notified:  Sister     PHYSICIAN       Additional Comment:     _______________________________________________ Mearl LatinNadia S [name], LCSWA 07/03/2015, 4:13 PM

## 2015-07-03 NOTE — Progress Notes (Signed)
  Echocardiogram Echocardiogram Transesophageal has been performed.  Tye SavoyCasey N Glena Pharris 07/03/2015, 1:41 PM

## 2015-07-03 NOTE — Care Management Note (Signed)
Case Management Note  Patient Details  Name: Shannon AmenYvonne M Reid MRN: [id number] Date of Birth: [birth year]  Subjective/Objective:                    Action/Plan: Patient discharging to SNF today. No further needs per CM.   Expected Discharge Date:                  Expected Discharge Plan:  Skilled Nursing Facility  In-House Referral:  Clinical Social Work  Discharge planning Services  CM Consult  Post Acute Care Choice:    Choice offered to:     DME Arranged:    DME Agency:     HH Arranged:    HH Agency:     Status of Service:  Completed, signed off  Medicare Important Message Given:  Yes Date Medicare IM Given:    Medicare IM give by:    Date Additional Medicare IM Given:    Additional Medicare Important Message give by:     If discussed at Long Length of Stay Meetings, dates discussed:    Additional Comments:  Kermit BaloKelli F Jahziah Simonin, RN 07/03/2015, 3:36 PM

## 2015-07-03 NOTE — Progress Notes (Signed)
Physical Therapy Treatment Patient Details Name: [name] MRN: [id number] DOB: 1950-08-09 Today's Date: 07/03/2015    History of Present Illness Pt is a 65 year old female admitted for dizziness, L gaze, slow speech, diaphoresis, and nausea vomiting. CT revealed small lacunar infact R cerebellum (embolic occlusion of the right superior cerebellar artery) PMH significant for HTN, DM and early on set Alzheimer's    PT Comments    Patient's balance improved from yesterday's session. Able to increase ambulation distance but still requires Min A due to mild ataxia. Gaze stabilization during mobility seems to help with balance especially during turns. Still not safe to return home alone. Will follow.   Follow Up Recommendations  SNF;Supervision/Assistance - 24 hour     Equipment Recommendations  Rolling walker with 5" wheels    Recommendations for Other Services       Precautions / Restrictions Precautions Precautions: Fall Restrictions Weight Bearing Restrictions: No    Mobility  Bed Mobility Overal bed mobility: Needs Assistance Bed Mobility: Supine to Sit;Sit to Supine     Supine to sit: Modified independent (Device/Increase time);HOB elevated Sit to supine: Modified independent (Device/Increase time);HOB elevated   General bed mobility comments: Some difficulty getting LEs into bed. Use of rails.   Transfers Overall transfer level: Needs assistance Equipment used: None Transfers: Sit to/from Stand Sit to Stand: Min guard         General transfer comment: Min guard for safety. Stood from [employer]. Unsteady in standing holding onto bed for support. Improved from yesterday.  Ambulation/Gait Ambulation/Gait assistance: Min assist Ambulation Distance (Feet): 180 Feet Assistive device: 1 person hand held assist;None (rail for support.) Gait Pattern/deviations: Step-through pattern;Decreased stride length;Wide base of support;Ataxic Gait velocity: decr   General  Gait Details: Ataxic like gait pattern.. Cues for gaze stabilization to help with balance esp during turns. Handheld assist for support and reaching for rail as well. Cues to increase cadence.    Stairs            Wheelchair Mobility    Modified Rankin (Stroke Patients Only) Modified Rankin (Stroke Patients Only) Pre-Morbid Rankin Score: No symptoms Modified Rankin: Moderately severe disability     Balance Overall balance assessment: Needs assistance Sitting-balance support: Feet supported;No upper extremity supported Sitting balance-Leahy Scale: Good     Standing balance support: During functional activity Standing balance-Leahy Scale: Fair Standing balance comment: Able to stand statically without UE support.                    Cognition Arousal/Alertness: Awake/alert Behavior During Therapy: WFL for tasks assessed/performed Overall Cognitive Status: History of cognitive impairments - at baseline       Memory: Decreased short-term memory              Exercises Other Exercises Other Exercises: sit to stand x10 with cues for slow descent.    General Comments        Pertinent Vitals/Pain Pain Assessment: Faces Faces Pain Scale: Hurts a little bit Pain Location: headache Pain Descriptors / Indicators: Headache Pain Intervention(s): Monitored during session;Repositioned    Home Living                      Prior Function            PT Goals (current goals can now be found in the care plan section) Progress towards PT goals: Progressing toward goals    Frequency  Min 4X/week  PT Plan Current plan remains appropriate    Co-evaluation             End of Session Equipment Utilized During Treatment: Gait belt Activity Tolerance: Patient tolerated treatment well Patient left: in bed;with call bell/phone within reach;with bed alarm set     Time: [id number] PT Time Calculation (min) (ACUTE ONLY): 20 min  Charges:  $Gait  Training: 8-22 mins                    G Codes:      Tamarah Bhullar A Clements Toro 07/03/2015, 10:17 AM  Mylo RedShauna Madailein Londo, PT, DPT [phone]

## 2015-07-03 NOTE — Discharge Summary (Signed)
Physician Discharge Summary  [name] ZOX:[id number] DOB: 14-Jun-1950 DOA: 06/27/2015  PCP: Lillia Mountain, MD  Admit date: 06/27/2015 Discharge date: 07/03/2015  Time spent: 35 minutes  Recommendations for Outpatient Follow-up:  Follow heart healthy and low carbohydrates diet Physical therapy as per SNF protocol Repeat BMET in 1 week to follow electrolytes and renal function  Follow up with neurology in 2 months Needs close follow up of INR and once therapeutic stop Aspirin  Discharge Diagnoses:  Principal Problem:   Dizziness Active Problems:   Major depressive disorder (HCC)   Hypertension   Chronic back pain   Hyperlipidemia   Diabetes mellitus, type 2 (HCC)   Early onset Alzheimer's dementia without behavioral disturbance   Tobacco abuse   Anxiety   Hypokalemia   AKI (acute kidney injury) (HCC)   Protein-calorie malnutrition, moderate (HCC)   Nausea & vomiting   Acute CVA (cerebrovascular accident) (HCC)   Cerebellar stroke (HCC)   Emesis   Stroke [facility])   Discharge Condition: stable and improved. Discharge to SNF for further care and rehabilitation. Follow up with neurology in 2 months as an outpatient   Diet recommendation: heart healthy and low carb diet  Filed Weights   07/03/15 1515  Weight: 90 kg (198 lb 6.6 oz)    History of present illness:  65 y.o. female history of hypertension, diabetes mellitus, pancreatitis, hypothyroidism and dementia, brought to the emergency room by EMS and code stroke status following acute onset of feeling of weakness and numbness of breath as well as diaphoresis and slowed speech. Patient was also had nausea and vomiting. She reportedly had leftward gaze. There was no loss of consciousness. It's unclear whether she was actually confused. Blood sugar was 245. CT scan of her head showed no acute intracranial abnormality. Potassium was low at 2.6. BUN and creatinine was slightly elevated. Urinalysis is pending. Patient had no  focal deficits on initial evaluation in the ED, including no eye movement abnormality. Speech was somewhat slowed but not dysarthric. NIH stroke score was 1 for not knowing her current age. Patient was noted to be diaphoretic and clammy. Code stroke was canceled. Patient was not administered IV t-PA. She was admitted for further evaluation and treatment.  Hospital Course:  Dizziness and left gaze: Due to right cerebellar infarcts. CT head is negative for acute intracranial abnormalities, but MRI showed a small lacunar infarcts.  -EEG unremarkable  -given abnormal MRI, stroke work up was completed ; including TEE (which demonstrated Lambl's excrescences) -neurology on board and recommended treatment with coumadin base on TEE findings -no bridge needed; will use full dose aspirin along with coumadin and once INR therapeutic will stop ASA. -continue risk factors modifications  -neurology will follow up in 2 months as an outpatient  -physical deconditioning, to be rehabilitated at SNF -residual vertigo treated with PRN meclizine   Nausea, vomiting: Etiology is not clear. Likely due to viral gastritis Vs associated with stroke. -had hx of chronic pancreatitis  -lipase WNL during this admission and no pain on physical exam -advise to maintain adequate hydration  -PRN antiemetics given; at discharge no further N/V appreciated or reproted  Depression and anxiety: no suicidal or homicidal ideations. -mood stable  -Continue home medications: PRN Xanax and daily Remeron/Zoloft  Hypokalemia: K=2.5 on admission. -repleted and WNL at discharge -will recommend BMET in 5 days to follow trend  -Mg WNL  HTN: -will resume lisinopril and will continue amlodipine  -advise to follow low sodium diet   HLD:  Last LDL was 56 on 07/02/13 and remains at 56 on repeated lipid panel during this admission -will continue statins  DM-II: Last A1c 6.0 on 10/17/13, well controled.  -Patient was taking metformin at  home; will resume at discharge -advise to follow low carb diet -A1c 6.4 during this admission   Early onset Alzheimer's dementia without behavioral disturbance: -continue Donepezil and supportive care  Tobacco abuse: -cessation counseling provided -Nicotine patch ordered   AKI: Likely due to prerenal secondary to dehydration and continuation of ACEI and NSAIDs -resolved with IVF and holding nephrotoxic agents -advise to maintain adequate hydration Repeat BMET in 5 days to follow renal function and electrolytes trend   Protein-calorie malnutrition, moderate (HCC): -Consultation to nutritional service in place -will follow rec's for feeding supplements   Procedures:  MRI brain: with patchy right cerebellar infarcts   Ct angio of head and neck: Poor flow in the Right SCA, No emergent large vessel occlusion, posterior circulation otherwise negative. Moderate Left ICA cavernous Stenosis. Emphysema and atelectasis in the lung apices.   Loop recorder implantation: depending on TEE  EEG: unremarkable  TEE Normal LV size and function Normal RV size and function Normal RA Normal LA and LA appendage with no evidence of thrombus Normal TV with trivial TR Normal PV with trivial PR Mildly calcified anterior MV leaflet with mild mitral annular calcification and trivial MR. Trileaflet AV with trivial AR. 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  Lipomatous interatrial septum with no evidence of shunt by colorflow dopper or agitated saline contrast injection Normal thoracic and ascending aorta.  Recommend anticoagluation for Lambl's excruscenes. If patient has another CVA on anticoagluation may need CVTS evaluation.  Consultations:  Neurology  Cardiology (for TEE, no loop recorder needed base on TEE  findings)  Discharge Exam: Filed Vitals:   07/03/15 1240 07/03/15 1437  BP: 127/69 142/91  Pulse: 61 66  Temp:  97.8 F (36.6 C)  Resp: 21 17    General: afebrile, denies CP and SOB. Overall stable. Patient w/o dysarthria or focal motor deficit; in no distress. Oriented X 2; but with overall decrease insight (probably baseline for her her dementia). intermittent vertigo reported.  Cardiovascular: S1 and S2, no rubs or gallops  Respiratory: good air movement, no wheezing, no crackles  Abdomen: soft, NT, ND, positive BS  Musculoskeletal: no edema or cyanosis   Neuro: EOMI, grossly normal CN; MS 4//5 bilaterally due to poor effort. Mild dysmetria on finger to nose and slight diminish changes on fine finger movements on the right. Also reported some intermittent vertigo sensation  Discharge Instructions   Discharge Instructions    Ambulatory referral to Neurology    Complete by:  As directed   Dr. Roda Shutters requests followup in 2 months. Patient discharging to SNF     Diet - low sodium heart healthy    Complete by:  As directed      Discharge instructions    Complete by:  As directed   Take medications as prescribed Follow heart healthy and low carbohydrates diet Physical therapy as per SNF protocol Repeat BMET in 1 week to follow electrolytes and renal function  Follow up with neurology in 2 months Needs close follow up of INR and once therapeutic stop Aspirin          Current Discharge Medication  List    START taking these medications   Details  feeding supplement, GLUCERNA SHAKE, (GLUCERNA SHAKE) LIQD Take 237 mLs by mouth 2 (two) times daily between meals.    meclizine (ANTIVERT) 12.5 MG tablet Take 1 tablet (12.5 mg total) by mouth 3 (three) times daily as needed for dizziness.    nicotine (NICODERM CQ - DOSED IN MG/24 HOURS) 21 mg/24hr patch Place 1 patch (21 mg total) onto the skin daily.    warfarin (COUMADIN) 5 MG tablet Take 1 tablet (5 mg total) by mouth daily.       CONTINUE these medications which have CHANGED   Details  ALPRAZolam (XANAX) 0.5 MG tablet Take 1 tablet (0.5 mg total) by mouth 2 (two) times daily as needed for anxiety. Qty: 20 tablet, Refills: 0    aspirin EC 325 MG tablet Take 1 tablet (325 mg total) by mouth daily. To be taken along with coumadin until therapeutic; once patient achieve therapeutic INR stop ASA    HYDROcodone-acetaminophen (NORCO/VICODIN) 5-325 MG tablet Take 1 tablet by mouth every 6 (six) hours as needed for severe pain. Qty: 20 tablet, Refills: 0    traMADol (ULTRAM) 50 MG tablet Take 1 tablet (50 mg total) by mouth every 8 (eight) hours as needed for moderate pain. Qty: 20 tablet, Refills: 0      CONTINUE these medications which have NOT CHANGED   Details  acetaminophen (TYLENOL) 325 MG tablet Take 325 mg by mouth every 6 (six) hours as needed for mild pain.     amLODipine (NORVASC) 5 MG tablet Take 5 mg by mouth daily.    atorvastatin (LIPITOR) 40 MG tablet Take 40 mg by mouth daily.    CALCIUM PO Take 1 tablet by mouth daily.    donepezil (ARICEPT) 10 MG tablet Take 1 tablet (10 mg total) by mouth at bedtime. Qty: 30 tablet, Refills: 5    levothyroxine (SYNTHROID, LEVOTHROID) 50 MCG tablet Take 1 tablet (50 mcg total) by mouth daily. Qty: 90 tablet, Refills: 1    lisinopril (PRINIVIL,ZESTRIL) 20 MG tablet TAKE 1 TABLET (20 MG TOTAL) BY MOUTH DAILY. Qty: 90 tablet, Refills: 0    metFORMIN (GLUCOPHAGE) 500 MG tablet TAKE ONE TABLET BY MOUTH ONCE DAILY Qty: 30 tablet, Refills: 1   Associated Diagnoses: Type 2 diabetes mellitus with complication (HCC)    mirtazapine (REMERON) 15 MG tablet Take 15 mg by mouth daily as needed (depressive disorder).    multivitamin-iron-minerals-folic acid (CENTRUM) chewable tablet Chew 1 tablet by mouth daily.    sertraline (ZOLOFT) 100 MG tablet Take 1 tablet (100 mg total) by mouth 2 (two) times daily. Qty: 60 tablet, Refills: 2   Associated Diagnoses: Major  depressive disorder, recurrent episode, moderate (HCC)      STOP taking these medications     meloxicam (MOBIC) 15 MG tablet        No Known Allergies Follow-up Information    Follow up with Xu,Jindong, MD In 2 months.   Specialty:  Neurology   Why:  Stroke Clinic, Office will call you with appointment date & time   Contact information:   [address] [place] [phone]       Follow up with Lillia MountainGRIFFIN,JOHN JOSEPH, MD.   Specialty:  Internal Medicine   Why:  in 10 days after been discharge from SNF   Contact information:   [address] [place] [address] [phone]       The results of  significant diagnostics from this hospitalization (including imaging, microbiology, ancillary and laboratory) are listed below for reference.    Significant Diagnostic Studies: Ct Angio Head W/cm &/or Wo Cm  06/28/2015  CLINICAL DATA:  65 year old female with acute right cerebellar infarcts, SCA territory, discovered during evaluation for acute dizziness. Initial encounter. EXAM: CT ANGIOGRAPHY HEAD AND NECK TECHNIQUE: Multidetector CT imaging of the head and neck was performed using the standard protocol during bolus administration of intravenous contrast. Multiplanar CT image reconstructions and MIPs were obtained to evaluate the vascular anatomy. Carotid stenosis measurements (when applicable) are obtained utilizing NASCET criteria, using the distal internal carotid diameter as the denominator. CONTRAST:  50 mL Isovue 370 COMPARISON:  Brain MRI 0320 hours today, and earlier. FINDINGS: CTA NECK Skeleton: Widespread cervical spine degeneration. No acute osseous abnormality identified. Visualized paranasal sinuses and mastoids are clear. Other neck: Centrilobular emphysema and dependent opacity in the lung apices, favor the latter due to atelectasis. No superior mediastinal lymphadenopathy. Negative thyroid, larynx, pharynx, parapharyngeal spaces,  sublingual space, submandibular glands and parotid glands. Retropharyngeal spaces are normal aside from a retropharyngeal course of both carotids, especially the left. No cervical lymphadenopathy. Aortic arch: Bovine type arch configuration. Tortuous proximal great vessels. Mild to moderate arch and proximal great vessel calcified atherosclerosis. Right carotid system: Tortuous brachiocephalic artery with a kinked appearance (coronal image 65). Normal right CCA origin. Partially retropharyngeal course of the right CCA. Negative right carotid bifurcation. Minimal calcified plaque right ICA bulb with no stenosis. Tortuous cervical right ICA with a kinked appearance at the C1 level (series 407, image 20). Left carotid system: No left CCA origin stenosis. Tortuous proximal left CCA. Retropharyngeal course of the left CCA and left ICA. Negative left carotid bifurcation. Mildly tortuous cervical left ICA. Vertebral arteries:No proximal right subclavian artery stenosis. Normal right vertebral artery origin. Normal right vertebral artery to the skullbase. No proximal left subclavian artery stenosis. Mild calcified plaque near the left vertebral artery origin with no stenosis. Tortuous left V1 segment. Negative left vertebral artery otherwise to the skullbase. CTA HEAD Posterior circulation: Early origin of the left PICA which otherwise appears normal. The right PICA is diminutive or absent. No distal vertebral artery stenosis. Normal vertebrobasilar junction. Normal right AICA origin. No basilar stenosis. There appears to be a shared origin of the right PCA and right SCA. The right SCA is faintly enhancing on series 404, image 102 - but is then quickly attenuated. Normal left SCA. Normal bilateral PCA origins otherwise. Posterior communicating arteries are diminutive or absent. Normal PCA branches. Anterior circulation: Both ICA siphons are patent. There is mild to moderate calcified plaque primarily affecting the cavernous  segments and greater on the left. There is moderate left cavernous segment stenosis as seen on series 404, image 120. No hemodynamically significant stenosis on the right. Normal ophthalmic artery origins. Normal carotid termini. Normal MCA and ACA origins. Mild irregularity and stenosis of the right ACA A1 segment. Normal left A1. Anterior communicating artery and bilateral ACA branches are within normal limits. Left MCA M1 segment, bifurcation, and left MCA branches are within normal limits. Right MCA M1 segment, bifurcation, and right MCA branches are within normal limits. Venous sinuses: Superior sagittal, transverse, and sigmoid sinuses are patent. Anatomic variants: Bovine type aortic arch configuration. IMPRESSION: 1. Positive for Poor flow in the Right SCA, concordant with the acute superior right cerebellar infarcts demonstrated this morning. 2. Negative for emergent large vessel occlusion, and the posterior circulation is otherwise negative. 3. Anterior circulation remarkable  for tortuous carotid arteries with left greater than right retropharyngeal course, and calcified ICA siphon atherosclerosis resulting in Moderate Left ICA cavernous segment Stenosis. 4. Emphysema and atelectasis in the lung apices. Electronically Signed   By: Odessa Fleming M.D.   On: 06/28/2015 13:49   Ct Head Wo Contrast  06/27/2015  CLINICAL DATA:  Code stroke. Left-sided gaze, headache and weakness. Initial encounter. EXAM: CT HEAD WITHOUT CONTRAST TECHNIQUE: Contiguous axial images were obtained from the base of the skull through the vertex without intravenous contrast. COMPARISON:  MRI of the brain performed 02/22/2014 FINDINGS: There is no evidence of acute infarction, mass lesion, or intra- or extra-axial hemorrhage on CT. Prominence of the ventricles and sulci reflects mild cortical volume loss. Mild periventricular white matter change likely reflects small vessel ischemic microangiopathy. A small chronic lacunar infarct is noted  at the right cerebellar hemisphere. The brainstem and fourth ventricle are within normal limits. The basal ganglia are unremarkable in appearance. The cerebral hemispheres demonstrate grossly normal gray-white differentiation. No mass effect or midline shift is seen. There is no evidence of fracture; visualized osseous structures are unremarkable in appearance. The orbits are within normal limits. The paranasal sinuses and mastoid air cells are well-aerated. No significant soft tissue abnormalities are seen. IMPRESSION: 1. No acute intracranial pathology seen on CT. 2. Mild cortical volume loss and scattered small vessel ischemic microangiopathy. 3. Small chronic lacunar infarct at the right cerebellar hemisphere. These results were called by telephone at the time of interpretation on 06/27/2015 at 9:37 pm to Dr. Roseanne Reno, who verbally acknowledged these results. Electronically Signed   By: Roanna Raider M.D.   On: 06/27/2015 21:37   Ct Angio Neck W/cm &/or Wo/cm  06/28/2015  CLINICAL DATA:  65 year old female with acute right cerebellar infarcts, SCA territory, discovered during evaluation for acute dizziness. Initial encounter. EXAM: CT ANGIOGRAPHY HEAD AND NECK TECHNIQUE: Multidetector CT imaging of the head and neck was performed using the standard protocol during bolus administration of intravenous contrast. Multiplanar CT image reconstructions and MIPs were obtained to evaluate the vascular anatomy. Carotid stenosis measurements (when applicable) are obtained utilizing NASCET criteria, using the distal internal carotid diameter as the denominator. CONTRAST:  50 mL Isovue 370 COMPARISON:  Brain MRI 0320 hours today, and earlier. FINDINGS: CTA NECK Skeleton: Widespread cervical spine degeneration. No acute osseous abnormality identified. Visualized paranasal sinuses and mastoids are clear. Other neck: Centrilobular emphysema and dependent opacity in the lung apices, favor the latter due to atelectasis. No  superior mediastinal lymphadenopathy. Negative thyroid, larynx, pharynx, parapharyngeal spaces, sublingual space, submandibular glands and parotid glands. Retropharyngeal spaces are normal aside from a retropharyngeal course of both carotids, especially the left. No cervical lymphadenopathy. Aortic arch: Bovine type arch configuration. Tortuous proximal great vessels. Mild to moderate arch and proximal great vessel calcified atherosclerosis. Right carotid system: Tortuous brachiocephalic artery with a kinked appearance (coronal image 65). Normal right CCA origin. Partially retropharyngeal course of the right CCA. Negative right carotid bifurcation. Minimal calcified plaque right ICA bulb with no stenosis. Tortuous cervical right ICA with a kinked appearance at the C1 level (series 407, image 20). Left carotid system: No left CCA origin stenosis. Tortuous proximal left CCA. Retropharyngeal course of the left CCA and left ICA. Negative left carotid bifurcation. Mildly tortuous cervical left ICA. Vertebral arteries:No proximal right subclavian artery stenosis. Normal right vertebral artery origin. Normal right vertebral artery to the skullbase. No proximal left subclavian artery stenosis. Mild calcified plaque near the left vertebral artery origin  with no stenosis. Tortuous left V1 segment. Negative left vertebral artery otherwise to the skullbase. CTA HEAD Posterior circulation: Early origin of the left PICA which otherwise appears normal. The right PICA is diminutive or absent. No distal vertebral artery stenosis. Normal vertebrobasilar junction. Normal right AICA origin. No basilar stenosis. There appears to be a shared origin of the right PCA and right SCA. The right SCA is faintly enhancing on series 404, image 102 - but is then quickly attenuated. Normal left SCA. Normal bilateral PCA origins otherwise. Posterior communicating arteries are diminutive or absent. Normal PCA branches. Anterior circulation: Both ICA  siphons are patent. There is mild to moderate calcified plaque primarily affecting the cavernous segments and greater on the left. There is moderate left cavernous segment stenosis as seen on series 404, image 120. No hemodynamically significant stenosis on the right. Normal ophthalmic artery origins. Normal carotid termini. Normal MCA and ACA origins. Mild irregularity and stenosis of the right ACA A1 segment. Normal left A1. Anterior communicating artery and bilateral ACA branches are within normal limits. Left MCA M1 segment, bifurcation, and left MCA branches are within normal limits. Right MCA M1 segment, bifurcation, and right MCA branches are within normal limits. Venous sinuses: Superior sagittal, transverse, and sigmoid sinuses are patent. Anatomic variants: Bovine type aortic arch configuration. IMPRESSION: 1. Positive for Poor flow in the Right SCA, concordant with the acute superior right cerebellar infarcts demonstrated this morning. 2. Negative for emergent large vessel occlusion, and the posterior circulation is otherwise negative. 3. Anterior circulation remarkable for tortuous carotid arteries with left greater than right retropharyngeal course, and calcified ICA siphon atherosclerosis resulting in Moderate Left ICA cavernous segment Stenosis. 4. Emphysema and atelectasis in the lung apices. Electronically Signed   By: Odessa Fleming M.D.   On: 06/28/2015 13:49   Mr Brain Wo Contrast  06/28/2015  CLINICAL DATA:  Initial valuation for acute dizziness. History of hypertension, hyperlipidemia, diabetes. EXAM: MRI HEAD WITHOUT CONTRAST TECHNIQUE: Multiplanar, multiecho pulse sequences of the brain and surrounding structures were obtained without intravenous contrast. COMPARISON:  Prior CT from 06/27/2015. FINDINGS: Mildly advanced cerebral atrophy for patient age. Patchy and confluent T2/FLAIR hyperintensity within the periventricular white matter most like related chronic small vessel ischemic disease.  Chronic small vessel ischemic changes present within the pons as well. This is fairly mild nature. Remote lacunar infarct within the left caudate body. Probable additional small remote cortical infarct within the left occipital region (series 7, image 11). Probable additional small remote lacunar infarcts within the bilateral cerebellar hemispheres. Patchy restricted diffusion present within the superior aspect of the right cerebellar hemisphere near the middle cerebellar peduncle (series 4, image 14). This is positioned adjacent to the fourth ventricle with involvement of the vermis. No associated mass effect or hemorrhage. No other acute infarct. Gray-white matter differentiation otherwise maintained. Major intracranial vascular flow voids are preserved. No acute or chronic intracranial hemorrhage. No mass lesion, midline shift, or mass effect. No hydrocephalus. No extra-axial fluid collection. Major dural sinuses are grossly patent. Craniocervical junction within normal limits. Mild multilevel degenerative spondylolysis noted within the visualized upper cervical spine without significant stenosis. Incidental note made of an empty sella. No acute abnormality about the orbits. Mild mucosal thickening within the paranasal sinuses. No air-fluid levels to suggest active sinus infection. No mastoid effusion. Inner ear structures grossly normal. Bone marrow signal intensity within normal limits. No scalp soft tissue abnormality. IMPRESSION: 1. Small patchy acute right cerebellar infarcts as above. No associated hemorrhage or  mass effect. 2. Remote lacunar infarcts involving the left caudate and bilateral cerebellar hemispheres, with additional small remote cortical infarct within the left occipital lobe. 3. Mild atrophy with chronic small vessel ischemic disease. Electronically Signed   By: Rise Mu M.D.   On: 06/28/2015 04:59    Microbiology: Recent Results (from the past 240 hour(s))  Surgical pcr  screen     Status: None   Collection Time: 07/03/15  7:10 AM  Result Value Ref Range Status   MRSA, PCR NEGATIVE NEGATIVE Final   Staphylococcus aureus NEGATIVE NEGATIVE Final    Comment:        The Xpert SA Assay (FDA approved for NASAL specimens in patients over 29 years of age), is one component of a comprehensive surveillance program.  Test performance has been validated by [facility] for patients greater than or equal to 61 year old. It is not intended to diagnose infection nor to guide or monitor treatment.      Labs: Basic Metabolic Panel:  Recent Labs Lab 06/27/15 2122 06/27/15 2134 06/28/15 0148 06/28/15 1813 06/29/15 0602 07/03/15 0733  NA 137 143  --   --  136 137  K 2.5* 2.6*  --  4.5 4.8 3.7  CL 107 107  --   --  108 102  CO2 14*  --   --   --  24 25  GLUCOSE 237* 245*  --   --  105* 124*  BUN 16 21*  --   --  12 19  CREATININE 1.26* 1.10*  --   --  1.00 1.10*  CALCIUM 9.6  --   --   --  9.7 9.6  MG  --   --  2.1  --  1.6*  --    Liver Function Tests:  Recent Labs Lab 06/27/15 2122 06/28/15 0148  AST 32 30  ALT 19 21  ALKPHOS 78 77  BILITOT 0.7 0.4  PROT 8.0 7.4  ALBUMIN 3.7 3.5    Recent Labs Lab 06/28/15 0148  LIPASE 41   CBC:  Recent Labs Lab 06/27/15 2122 06/27/15 2134  WBC 9.1  --   NEUTROABS 4.4  --   HGB 13.1 14.6  HCT 39.7 43.0  MCV 84.3  --   PLT 183  --    Cardiac Enzymes:  Recent Labs Lab 06/28/15 0148 06/28/15 0524 06/28/15 1133  TROPONINI <0.03 <0.03 <0.03   CBG:  Recent Labs Lab 07/02/15 1146 07/02/15 1639 07/02/15 2124 07/03/15 0649 07/03/15 1325  GLUCAP 131* 112* 150* 119* 111*     Signed:  Vassie Loll MD.  [facility] 07/03/2015, 3:39 PM

## 2015-07-03 NOTE — CV Procedure (Signed)
    PROCEDURE NOTE:  Procedure:  Transesophageal echocardiogram Operator:  Armanda Magicraci Turner, MD Indications:  CVA Complications: None  During this procedure the patient is administered a total of Versed 4 mg and Fentanyl 50 mg to achieve and maintain moderate conscious sedation.  The patient's heart rate, blood pressure, and oxygen saturation are monitored continuously during the procedure. The period of conscious sedation is 15 minutes, of which I was present face-to-face 100% of this time.  Results: Normal LV size and function Normal RV size and function Normal RA Normal LA and LA appendage with no evidence of thrombus Normal TV with trivial TR Normal PV with trivial PR Mildly calcified anterior MV leaflet with mild mitral annular calcification and trivial MR. Trileaflet AV with trivial AR.  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   Lipomatous interatrial septum with no evidence of shunt by colorflow dopper or agitated saline contrast injection Normal thoracic and ascending aorta.  Recommend anticoagluation for Lambl's excruscenes.  If patient has another CVA on anticoagluation may need CVTS evaluation.   The patient tolerated the procedure well and was transferred back to their room in stable condition.  Signed: Armanda Magicraci Turner, MD [facility]

## 2015-07-05 ENCOUNTER — Non-Acute Institutional Stay (SKILLED_NURSING_FACILITY): Payer: Medicare Other | Admitting: Internal Medicine

## 2015-07-05 DIAGNOSIS — E876 Hypokalemia: Secondary | ICD-10-CM | POA: Diagnosis not present

## 2015-07-05 DIAGNOSIS — F0280 Dementia in other diseases classified elsewhere without behavioral disturbance: Secondary | ICD-10-CM | POA: Diagnosis not present

## 2015-07-05 DIAGNOSIS — N179 Acute kidney failure, unspecified: Secondary | ICD-10-CM | POA: Diagnosis not present

## 2015-07-05 DIAGNOSIS — G300 Alzheimer's disease with early onset: Secondary | ICD-10-CM | POA: Diagnosis not present

## 2015-07-05 DIAGNOSIS — I639 Cerebral infarction, unspecified: Secondary | ICD-10-CM

## 2015-07-06 NOTE — Progress Notes (Signed)
Location:  [facility] Room Number: 107/P Place of Service:  SNF [id number]) Provider:  Manuela Schwartz, MD  Patient Care Team: Kirby Funk, MD as PCP - General (Internal Medicine) Jamas Lav, MD as Consulting Physician (Psychiatry) Trey Sailors, MD as Consulting Physician (Neurosurgery)  Extended Emergency Contact Information Primary Emergency Contact: Althea Grimmer [place] Work Phone: [phone] Mobile Phone: [phone] Relation: Sister Secondary Emergency Contact: Morales,Michael Address: [address] [address] [place] Home Phone: [phone] Relation: Friend  Code Status:  Full Code Goals of care: Advanced Directive information Advanced Directives 07/01/2015  Does patient have an advance directive? No  Would patient like information on creating an advanced directive? No - patient declined information     Chief Complaint  Patient presents with  . Discharge Note    HPI:  Pt is a 65 y.o. female seen today for Discharge.  Patient was just admitted the facility on 07/03/2015 after hospitalization for a right cerebellar infarct.  She presented to hospital with weakness numbness in left gaze preference.  She also has slowed speech.  CT scan of the head did not show any acute intracranial abnormality potassium was low at 2.6.  However her MRI did show a smallacuner infarct--  Neurology recommended treatment with Coumadin based on TEE findings  Recommendation use low-dose aspirin along with Coumadin once INR is therapeutic stop the aspirin.  It was thought she would need physical conditioning and thus was sent to the here for rehabilitation.  She appears to be doing quite well she is ambulatory she is quite adamant she would like to go home.  Her other issues appear to be stable she will need expedient follow-up of her INR which is low which is to  be expected it is gradually rising most recently 1.2.  Her other medical issues including hypertension mild dementia diabetes type 2 appear to be stable.  She does have a boyfriend she will be with apparently is very supportive.  She does have a listed history of dementia this appears to be quite mild she is on Aricept.     Past Medical History  Diagnosis Date  . Hypertension   . Diabetes mellitus   . Pancreatitis 2010  . Idiopathic acute pancreatitis 09/16/2011  . Trigger finger   . Hypothyroidism   . Dementia   . Memory loss   . Tobacco abuse    Past Surgical History  Procedure Laterality Date  . Cholecystectomy  2003  . Spine surgery  2006    Dr. Trey Sailors - L3-L4, L4-L5 laminotomy and foraminotomy  . Back surgery    . Cyst removal hand Left 12/11/2014    Procedure: LEFT RING FINGER TENDON SHEATH CYST EXCISION;  Surgeon: Mack Hook, MD;  Location: [facility];  Service: Orthopedics;  Laterality: Left;  . Tee without cardioversion N/A 07/03/2015    Procedure: TRANSESOPHAGEAL ECHOCARDIOGRAM (TEE);  Surgeon: Quintella Reichert, MD;  Location: [facility] ENDOSCOPY;  Service: Cardiovascular;  Laterality: N/A;    No Known Allergies    Medication List       This list is accurate as of: 07/05/15 11:59 PM.  Always use your most recent med list.               acetaminophen 325 MG tablet  Commonly known as:  TYLENOL  Take 325 mg by mouth every 6 (six)  hours as needed for mild pain.     ALPRAZolam 0.5 MG tablet  Commonly known as:  XANAX  Take 1 tablet by mouth twice a day for 14 days stop date 07/16/15     aspirin EC 325 MG tablet  Take 1 tablet (325 mg total) by mouth daily. To be taken along with coumadin until therapeutic; once patient achieve therapeutic INR stop ASA     atorvastatin 40 MG tablet  Commonly known as:  LIPITOR  Take 40 mg by mouth daily.     CALCIUM PO  Take 1 tablet by mouth daily.     donepezil 10 MG tablet  Commonly known as:  ARICEPT    Take 1 tablet (10 mg total) by mouth at bedtime.     feeding supplement (GLUCERNA SHAKE) Liqd  Take 237 mLs by mouth 2 (two) times daily between meals.     HYDROcodone-acetaminophen 5-325 MG tablet  Commonly known as:  NORCO/VICODIN  Take 1 tablet by mouth every 6 (six) hours as needed for severe pain.     levothyroxine 50 MCG tablet  Commonly known as:  SYNTHROID, LEVOTHROID  Take 1 tablet (50 mcg total) by mouth daily.     lisinopril 20 MG tablet  Commonly known as:  PRINIVIL,ZESTRIL  TAKE 1 TABLET (20 MG TOTAL) BY MOUTH DAILY.     meclizine 12.5 MG tablet  Commonly known as:  ANTIVERT  Take 1 tablet (12.5 mg total) by mouth 3 (three) times daily as needed for dizziness.     metFORMIN 500 MG tablet  Commonly known as:  GLUCOPHAGE  TAKE ONE TABLET BY MOUTH ONCE DAILY     mirtazapine 15 MG tablet  Commonly known as:  REMERON  Take 15 mg by mouth daily as needed (depressive disorder).     multivitamin-iron-minerals-folic acid chewable tablet  Chew 1 tablet by mouth daily.     sertraline 100 MG tablet  Commonly known as:  ZOLOFT  Take 1 tablet (100 mg total) by mouth 2 (two) times daily.     traMADol 50 MG tablet  Commonly known as:  ULTRAM  Take 1 tablet (50 mg total) by mouth every 8 (eight) hours as needed for moderate pain.     warfarin 5 MG tablet  Commonly known as:  COUMADIN  Take 1 tablet (5 mg total) by mouth daily.        Review of Systems   In general does not complaining of any fever or chills wants to go home.  Skin does not complain of rashes or itching.  Head ears eyes nose mouth and throat does not complain of visual changes did have a left gaze on presentation to hospital.  Does not complain of any visual deficits at this time.  Respiratory is not complaining of shortness breath or cough.  Cardiac no chest pain .Or significant lower extremity edema  Abdomen is not complaining of any nausea vomiting diarrhea constipation or abdominal  discomfort.  Musculoskeletal does not complaining of joint pain or strength deficits.  Neurologic again history of recent CVA O she appears to have minimal residual deficits here does not complain of headache dizziness or syncopal does have a history of residual vertigo this is being treated with when necessary Antivert.  Psych does have a history of dementia this appears to be quite mild.    Immunization History  Administered Date(s) Administered  . Influenza Split 11/03/2011  . Influenza,inj,Quad PF,36+ Mos 11/17/2012  . Tdap 12/19/2011   Pertinent  Health Maintenance Due  Topic Date Due  . FOOT EXAM  07/05/2016 (Originally 02/15/2013)  . OPHTHALMOLOGY EXAM  07/05/2016 (Originally 04/28/2013)  . PAP SMEAR  07/06/2018 (Originally 01/09/1972)  . COLONOSCOPY  07/05/2025 (Originally 01/08/2001)  . INFLUENZA VACCINE  08/28/2015  . HEMOGLOBIN A1C  12/28/2015  . MAMMOGRAM  02/25/2016   Fall Risk  05/04/2015 09/23/2013  Falls in the past year? No No   Functional Status Survey:    Filed Vitals:   07/06/15 1123  BP: 110/74  Pulse: 62  Temp: 97.7 F (36.5 C)  TempSrc: Oral  Resp: 18   There is no weight on file to calculate BMI. Physical Exam  In general this is a pleasant middle-aged female in no distress sitting comfortably in chair she is ambulating about the facility however.  Her skin is warm and dry.  Oropharynx clear mucous membranes moist.  Eyes visual acuity appears grossly intact pupils are reactive.  Chest is clear to auscultation there is no labored breathing.  Heart is regular rate and rhythm without murmur gallop or rub.She does not have significant lower extremity edema  Abdomen is soft nontender positive bowel sounds.  Muscle skeletal I do not note any deformities strength appears to be intact all 4 extremities-she does ambulate about the facility has somewhat of a weak-appearing gait but not unstable   Neurologic could not really appreciate any focal  deficits cranial nerves appear intact her speech is clear.  Psych she appears grossly alert and oriented appropriate and appears at this point her dementia is mild.     .       Labs reviewed:  Recent Labs  06/27/15 2122 06/27/15 2134 06/28/15 0148 06/28/15 1813 06/29/15 0602 07/03/15 0733  NA 137 143  --   --  136 137  K 2.5* 2.6*  --  4.5 4.8 3.7  CL 107 107  --   --  108 102  CO2 14*  --   --   --  24 25  GLUCOSE 237* 245*  --   --  105* 124*  BUN 16 21*  --   --  12 19  CREATININE 1.26* 1.10*  --   --  1.00 1.10*  CALCIUM 9.6  --   --   --  9.7 9.6  MG  --   --  2.1  --  1.6*  --     Recent Labs  06/27/15 2122 06/28/15 0148  AST 32 30  ALT 19 21  ALKPHOS 78 77  BILITOT 0.7 0.4  PROT 8.0 7.4  ALBUMIN 3.7 3.5    Recent Labs  06/27/15 2122 06/27/15 2134  WBC 9.1  --   NEUTROABS 4.4  --   HGB 13.1 14.6  HCT 39.7 43.0  MCV 84.3  --   PLT 183  --    Lab Results  Component Value Date   TSH 2.751 06/28/2015   Lab Results  Component Value Date   HGBA1C 6.4* 06/28/2015   Lab Results  Component Value Date   CHOL 109 06/28/2015   HDL 39* 06/28/2015   LDLCALC 56 06/28/2015   TRIG 69 06/28/2015   CHOLHDL 2.8 06/28/2015    Significant Diagnostic Results in last 30 days:  Ct Angio Head W/cm &/or Wo Cm  06/28/2015  CLINICAL DATA:  65 year old female with acute right cerebellar infarcts, SCA territory, discovered during evaluation for acute dizziness. Initial encounter. EXAM: CT ANGIOGRAPHY HEAD AND NECK TECHNIQUE: Multidetector CT imaging of the head and  neck was performed using the standard protocol during bolus administration of intravenous contrast. Multiplanar CT image reconstructions and MIPs were obtained to evaluate the vascular anatomy. Carotid stenosis measurements (when applicable) are obtained utilizing NASCET criteria, using the distal internal carotid diameter as the denominator. CONTRAST:  50 mL Isovue 370 COMPARISON:  Brain MRI 0320 hours  today, and earlier. FINDINGS: CTA NECK Skeleton: Widespread cervical spine degeneration. No acute osseous abnormality identified. Visualized paranasal sinuses and mastoids are clear. Other neck: Centrilobular emphysema and dependent opacity in the lung apices, favor the latter due to atelectasis. No superior mediastinal lymphadenopathy. Negative thyroid, larynx, pharynx, parapharyngeal spaces, sublingual space, submandibular glands and parotid glands. Retropharyngeal spaces are normal aside from a retropharyngeal course of both carotids, especially the left. No cervical lymphadenopathy. Aortic arch: Bovine type arch configuration. Tortuous proximal great vessels. Mild to moderate arch and proximal great vessel calcified atherosclerosis. Right carotid system: Tortuous brachiocephalic artery with a kinked appearance (coronal image 65). Normal right CCA origin. Partially retropharyngeal course of the right CCA. Negative right carotid bifurcation. Minimal calcified plaque right ICA bulb with no stenosis. Tortuous cervical right ICA with a kinked appearance at the C1 level (series 407, image 20). Left carotid system: No left CCA origin stenosis. Tortuous proximal left CCA. Retropharyngeal course of the left CCA and left ICA. Negative left carotid bifurcation. Mildly tortuous cervical left ICA. Vertebral arteries:No proximal right subclavian artery stenosis. Normal right vertebral artery origin. Normal right vertebral artery to the skullbase. No proximal left subclavian artery stenosis. Mild calcified plaque near the left vertebral artery origin with no stenosis. Tortuous left V1 segment. Negative left vertebral artery otherwise to the skullbase. CTA HEAD Posterior circulation: Early origin of the left PICA which otherwise appears normal. The right PICA is diminutive or absent. No distal vertebral artery stenosis. Normal vertebrobasilar junction. Normal right AICA origin. No basilar stenosis. There appears to be a shared  origin of the right PCA and right SCA. The right SCA is faintly enhancing on series 404, image 102 - but is then quickly attenuated. Normal left SCA. Normal bilateral PCA origins otherwise. Posterior communicating arteries are diminutive or absent. Normal PCA branches. Anterior circulation: Both ICA siphons are patent. There is mild to moderate calcified plaque primarily affecting the cavernous segments and greater on the left. There is moderate left cavernous segment stenosis as seen on series 404, image 120. No hemodynamically significant stenosis on the right. Normal ophthalmic artery origins. Normal carotid termini. Normal MCA and ACA origins. Mild irregularity and stenosis of the right ACA A1 segment. Normal left A1. Anterior communicating artery and bilateral ACA branches are within normal limits. Left MCA M1 segment, bifurcation, and left MCA branches are within normal limits. Right MCA M1 segment, bifurcation, and right MCA branches are within normal limits. Venous sinuses: Superior sagittal, transverse, and sigmoid sinuses are patent. Anatomic variants: Bovine type aortic arch configuration. IMPRESSION: 1. Positive for Poor flow in the Right SCA, concordant with the acute superior right cerebellar infarcts demonstrated this morning. 2. Negative for emergent large vessel occlusion, and the posterior circulation is otherwise negative. 3. Anterior circulation remarkable for tortuous carotid arteries with left greater than right retropharyngeal course, and calcified ICA siphon atherosclerosis resulting in Moderate Left ICA cavernous segment Stenosis. 4. Emphysema and atelectasis in the lung apices. Electronically Signed   By: Odessa FlemingH  Hall M.D.   On: 06/28/2015 13:49   Ct Head Wo Contrast  06/27/2015  CLINICAL DATA:  Code stroke. Left-sided gaze, headache and weakness. Initial  encounter. EXAM: CT HEAD WITHOUT CONTRAST TECHNIQUE: Contiguous axial images were obtained from the base of the skull through the vertex  without intravenous contrast. COMPARISON:  MRI of the brain performed 02/22/2014 FINDINGS: There is no evidence of acute infarction, mass lesion, or intra- or extra-axial hemorrhage on CT. Prominence of the ventricles and sulci reflects mild cortical volume loss. Mild periventricular white matter change likely reflects small vessel ischemic microangiopathy. A small chronic lacunar infarct is noted at the right cerebellar hemisphere. The brainstem and fourth ventricle are within normal limits. The basal ganglia are unremarkable in appearance. The cerebral hemispheres demonstrate grossly normal gray-white differentiation. No mass effect or midline shift is seen. There is no evidence of fracture; visualized osseous structures are unremarkable in appearance. The orbits are within normal limits. The paranasal sinuses and mastoid air cells are well-aerated. No significant soft tissue abnormalities are seen. IMPRESSION: 1. No acute intracranial pathology seen on CT. 2. Mild cortical volume loss and scattered small vessel ischemic microangiopathy. 3. Small chronic lacunar infarct at the right cerebellar hemisphere. These results were called by telephone at the time of interpretation on 06/27/2015 at 9:37 pm to Dr. Roseanne Reno, who verbally acknowledged these results. Electronically Signed   By: Roanna Raider M.D.   On: 06/27/2015 21:37   Ct Angio Neck W/cm &/or Wo/cm  06/28/2015  CLINICAL DATA:  65 year old female with acute right cerebellar infarcts, SCA territory, discovered during evaluation for acute dizziness. Initial encounter. EXAM: CT ANGIOGRAPHY HEAD AND NECK TECHNIQUE: Multidetector CT imaging of the head and neck was performed using the standard protocol during bolus administration of intravenous contrast. Multiplanar CT image reconstructions and MIPs were obtained to evaluate the vascular anatomy. Carotid stenosis measurements (when applicable) are obtained utilizing NASCET criteria, using the distal internal  carotid diameter as the denominator. CONTRAST:  50 mL Isovue 370 COMPARISON:  Brain MRI 0320 hours today, and earlier. FINDINGS: CTA NECK Skeleton: Widespread cervical spine degeneration. No acute osseous abnormality identified. Visualized paranasal sinuses and mastoids are clear. Other neck: Centrilobular emphysema and dependent opacity in the lung apices, favor the latter due to atelectasis. No superior mediastinal lymphadenopathy. Negative thyroid, larynx, pharynx, parapharyngeal spaces, sublingual space, submandibular glands and parotid glands. Retropharyngeal spaces are normal aside from a retropharyngeal course of both carotids, especially the left. No cervical lymphadenopathy. Aortic arch: Bovine type arch configuration. Tortuous proximal great vessels. Mild to moderate arch and proximal great vessel calcified atherosclerosis. Right carotid system: Tortuous brachiocephalic artery with a kinked appearance (coronal image 65). Normal right CCA origin. Partially retropharyngeal course of the right CCA. Negative right carotid bifurcation. Minimal calcified plaque right ICA bulb with no stenosis. Tortuous cervical right ICA with a kinked appearance at the C1 level (series 407, image 20). Left carotid system: No left CCA origin stenosis. Tortuous proximal left CCA. Retropharyngeal course of the left CCA and left ICA. Negative left carotid bifurcation. Mildly tortuous cervical left ICA. Vertebral arteries:No proximal right subclavian artery stenosis. Normal right vertebral artery origin. Normal right vertebral artery to the skullbase. No proximal left subclavian artery stenosis. Mild calcified plaque near the left vertebral artery origin with no stenosis. Tortuous left V1 segment. Negative left vertebral artery otherwise to the skullbase. CTA HEAD Posterior circulation: Early origin of the left PICA which otherwise appears normal. The right PICA is diminutive or absent. No distal vertebral artery stenosis. Normal  vertebrobasilar junction. Normal right AICA origin. No basilar stenosis. There appears to be a shared origin of the right PCA and right SCA.  The right SCA is faintly enhancing on series 404, image 102 - but is then quickly attenuated. Normal left SCA. Normal bilateral PCA origins otherwise. Posterior communicating arteries are diminutive or absent. Normal PCA branches. Anterior circulation: Both ICA siphons are patent. There is mild to moderate calcified plaque primarily affecting the cavernous segments and greater on the left. There is moderate left cavernous segment stenosis as seen on series 404, image 120. No hemodynamically significant stenosis on the right. Normal ophthalmic artery origins. Normal carotid termini. Normal MCA and ACA origins. Mild irregularity and stenosis of the right ACA A1 segment. Normal left A1. Anterior communicating artery and bilateral ACA branches are within normal limits. Left MCA M1 segment, bifurcation, and left MCA branches are within normal limits. Right MCA M1 segment, bifurcation, and right MCA branches are within normal limits. Venous sinuses: Superior sagittal, transverse, and sigmoid sinuses are patent. Anatomic variants: Bovine type aortic arch configuration. IMPRESSION: 1. Positive for Poor flow in the Right SCA, concordant with the acute superior right cerebellar infarcts demonstrated this morning. 2. Negative for emergent large vessel occlusion, and the posterior circulation is otherwise negative. 3. Anterior circulation remarkable for tortuous carotid arteries with left greater than right retropharyngeal course, and calcified ICA siphon atherosclerosis resulting in Moderate Left ICA cavernous segment Stenosis. 4. Emphysema and atelectasis in the lung apices. Electronically Signed   By: Odessa Fleming M.D.   On: 06/28/2015 13:49   Mr Brain Wo Contrast  06/28/2015  CLINICAL DATA:  Initial valuation for acute dizziness. History of hypertension, hyperlipidemia, diabetes. EXAM:  MRI HEAD WITHOUT CONTRAST TECHNIQUE: Multiplanar, multiecho pulse sequences of the brain and surrounding structures were obtained without intravenous contrast. COMPARISON:  Prior CT from 06/27/2015. FINDINGS: Mildly advanced cerebral atrophy for patient age. Patchy and confluent T2/FLAIR hyperintensity within the periventricular white matter most like related chronic small vessel ischemic disease. Chronic small vessel ischemic changes present within the pons as well. This is fairly mild nature. Remote lacunar infarct within the left caudate body. Probable additional small remote cortical infarct within the left occipital region (series 7, image 11). Probable additional small remote lacunar infarcts within the bilateral cerebellar hemispheres. Patchy restricted diffusion present within the superior aspect of the right cerebellar hemisphere near the middle cerebellar peduncle (series 4, image 14). This is positioned adjacent to the fourth ventricle with involvement of the vermis. No associated mass effect or hemorrhage. No other acute infarct. Gray-white matter differentiation otherwise maintained. Major intracranial vascular flow voids are preserved. No acute or chronic intracranial hemorrhage. No mass lesion, midline shift, or mass effect. No hydrocephalus. No extra-axial fluid collection. Major dural sinuses are grossly patent. Craniocervical junction within normal limits. Mild multilevel degenerative spondylolysis noted within the visualized upper cervical spine without significant stenosis. Incidental note made of an empty sella. No acute abnormality about the orbits. Mild mucosal thickening within the paranasal sinuses. No air-fluid levels to suggest active sinus infection. No mastoid effusion. Inner ear structures grossly normal. Bone marrow signal intensity within normal limits. No scalp soft tissue abnormality. IMPRESSION: 1. Small patchy acute right cerebellar infarcts as above. No associated hemorrhage or  mass effect. 2. Remote lacunar infarcts involving the left caudate and bilateral cerebellar hemispheres, with additional small remote cortical infarct within the left occipital lobe. 3. Mild atrophy with chronic small vessel ischemic disease. Electronically Signed   By: Rise Mu M.D.   On: 06/28/2015 04:59    Assessment/Plan  #1 history of right cerebellar infarct-does not appear to have significant residual  deficits apparently some weakness however she is quite adamant she wants to go home she will obtain home health to follow up on this with PT and OT.  She does have a supportive boyfriend.  She is on aspirin as well as Coumadin with orders to discontinue the aspirin when Coumadin is therapeutic will need prompt following of her INR will order home health to draw this as well and notify primary care provider a follow-up.  Have   ordered a CBC BMP PT and INR tomorrow by home health with primary care provider notified of results  #2 history of intermittent vertigo she continues on Antivert she is not complaining of vertical currently but this will warm follow-up as well as.  History of depression and anxiety this appears to be stable appears to be in good spirits today no suicide [place] expressed to nursing-she is on Remeron and Zoloft continues on when necessary Xanax as well.  #3 hypokalemia this was repleted with normal normal at 3.7 on discharge to an 6 this will need follow-up as well recommendation to do this in 5 days to 1 week after hospital discharge. Again a BMP has been ordered for tomorrow as well as next week  #4-hypertension at this point appears relatively stable on lisinopril as well as Norvasc.  #5 history of hyperlipidemia last LDL was 56 on 07/02/2013 and was 56 and repeated lipid panel during hospital admission she is on a statin.  #6 history diabetes type 2 Hemoglobin A1c was 6.0 on a 21st 2015-it was 6.4 on hospital admission-she does take  Glucophage.  #7 history of early-onset Alzheimer's dementia there is been no behavioral disturbance she is on Aricept this appears to be quite mild.  #8 history of tobacco abuse nicotine patch has been ordered.  #9 history of acute kidney injury thought likely due to prerenal issue secondary to dehydration and Ace and NSAIDs-this resolved with IV fluids in the hospital and holding nephrotoxic agents.  Repeat BMP will have to be done to follow-up on this primary care provider notified of results.  Again patient will need home health support as well as PT and OT for continued strengthening-will need home health to draw on expedient PT/INR notify primary care provider for follow-up again recommendation to continue the aspirin until INR is therapeutic between 2 and 3.  Clinically patient appears to be quite stable but will need expedient follow-up as noted above.  [id number] note greater than 30 minutes spent on this discharge summary-greater than 50% of time spent coordinating plan of care     London Sheer, [place] [phone]

## 2015-07-12 ENCOUNTER — Ambulatory Visit: Payer: Medicare Other

## 2015-09-07 ENCOUNTER — Ambulatory Visit: Payer: Medicare Other | Admitting: Neurology

## 2015-09-10 ENCOUNTER — Encounter: Payer: Self-pay | Admitting: Neurology

## 2015-09-25 ENCOUNTER — Other Ambulatory Visit: Payer: Self-pay | Admitting: Neurology

## 2015-11-06 ENCOUNTER — Ambulatory Visit: Payer: Medicare Other | Admitting: Neurology

## 2015-11-22 ENCOUNTER — Ambulatory Visit: Payer: Medicare Other | Admitting: Neurology

## 2016-03-11 ENCOUNTER — Other Ambulatory Visit: Payer: Self-pay | Admitting: Neurology

## 2016-03-11 NOTE — Telephone Encounter (Signed)
Rx sent 

## 2016-04-08 ENCOUNTER — Other Ambulatory Visit: Payer: Self-pay | Admitting: Neurology

## 2016-05-02 ENCOUNTER — Telehealth: Payer: Self-pay | Admitting: Neurology

## 2016-05-02 NOTE — Telephone Encounter (Signed)
Returned call.No answer.  Recvd refill request for Donepezil from [facility] but saw last Refill sent to [facility]. Wanted to verify pharm change.

## 2016-05-02 NOTE — Telephone Encounter (Signed)
Caller: Pt  Urgent? No  Reason for the call: She said she had a missed call from the office. Not sure if you called her? Thanks

## 2016-05-05 MED ORDER — DONEPEZIL HCL 10 MG PO TABS: 10.0000 mg | ORAL_TABLET | Freq: Every day | ORAL | 0 refills | Status: DC

## 2016-05-05 NOTE — Telephone Encounter (Signed)
Spoke to patient. She would like to have med go to [facility]. Transfrd call to front desk to schedule appt. Patient verbalized understanding.

## 2016-05-14 ENCOUNTER — Ambulatory Visit: Payer: Medicare Other | Admitting: Neurology

## 2016-05-30 ENCOUNTER — Emergency Department (HOSPITAL_COMMUNITY): Payer: Medicare Other

## 2016-05-30 ENCOUNTER — Encounter (HOSPITAL_COMMUNITY): Payer: Self-pay | Admitting: Emergency Medicine

## 2016-05-30 ENCOUNTER — Inpatient Hospital Stay (HOSPITAL_COMMUNITY)
Admission: EM | Admit: 2016-05-30 | Discharge: 2016-06-04 | DRG: 065 | Disposition: A | Discharge status: Skilled Nursing Facility | Payer: Medicare Other | Attending: Internal Medicine | Admitting: Internal Medicine

## 2016-05-30 DIAGNOSIS — I10 Essential (primary) hypertension: Secondary | ICD-10-CM | POA: Diagnosis present

## 2016-05-30 DIAGNOSIS — F0151 Vascular dementia with behavioral disturbance: Secondary | ICD-10-CM | POA: Diagnosis present

## 2016-05-30 DIAGNOSIS — E113293 Type 2 diabetes mellitus with mild nonproliferative diabetic retinopathy without macular edema, bilateral: Secondary | ICD-10-CM | POA: Diagnosis present

## 2016-05-30 DIAGNOSIS — D696 Thrombocytopenia, unspecified: Secondary | ICD-10-CM | POA: Diagnosis present

## 2016-05-30 DIAGNOSIS — H5461 Unqualified visual loss, right eye, normal vision left eye: Secondary | ICD-10-CM | POA: Diagnosis present

## 2016-05-30 DIAGNOSIS — I5030 Unspecified diastolic (congestive) heart failure: Secondary | ICD-10-CM | POA: Diagnosis not present

## 2016-05-30 DIAGNOSIS — F17210 Nicotine dependence, cigarettes, uncomplicated: Secondary | ICD-10-CM | POA: Diagnosis present

## 2016-05-30 DIAGNOSIS — F419 Anxiety disorder, unspecified: Secondary | ICD-10-CM | POA: Diagnosis present

## 2016-05-30 DIAGNOSIS — I639 Cerebral infarction, unspecified: Secondary | ICD-10-CM | POA: Diagnosis present

## 2016-05-30 DIAGNOSIS — Z9049 Acquired absence of other specified parts of digestive tract: Secondary | ICD-10-CM | POA: Diagnosis not present

## 2016-05-30 DIAGNOSIS — E785 Hyperlipidemia, unspecified: Secondary | ICD-10-CM | POA: Diagnosis present

## 2016-05-30 DIAGNOSIS — F05 Delirium due to known physiological condition: Secondary | ICD-10-CM | POA: Diagnosis not present

## 2016-05-30 DIAGNOSIS — Z7984 Long term (current) use of oral hypoglycemic drugs: Secondary | ICD-10-CM | POA: Diagnosis not present

## 2016-05-30 DIAGNOSIS — F0281 Dementia in other diseases classified elsewhere with behavioral disturbance: Secondary | ICD-10-CM | POA: Diagnosis present

## 2016-05-30 DIAGNOSIS — Z8673 Personal history of transient ischemic attack (TIA), and cerebral infarction without residual deficits: Secondary | ICD-10-CM | POA: Diagnosis not present

## 2016-05-30 DIAGNOSIS — Z9119 Patient's noncompliance with other medical treatment and regimen: Secondary | ICD-10-CM | POA: Diagnosis not present

## 2016-05-30 DIAGNOSIS — J449 Chronic obstructive pulmonary disease, unspecified: Secondary | ICD-10-CM | POA: Diagnosis not present

## 2016-05-30 DIAGNOSIS — I358 Other nonrheumatic aortic valve disorders: Secondary | ICD-10-CM | POA: Diagnosis not present

## 2016-05-30 DIAGNOSIS — Z79899 Other long term (current) drug therapy: Secondary | ICD-10-CM | POA: Diagnosis not present

## 2016-05-30 DIAGNOSIS — G309 Alzheimer's disease, unspecified: Secondary | ICD-10-CM | POA: Diagnosis present

## 2016-05-30 DIAGNOSIS — Z87891 Personal history of nicotine dependence: Secondary | ICD-10-CM | POA: Diagnosis not present

## 2016-05-30 DIAGNOSIS — I63539 Cerebral infarction due to unspecified occlusion or stenosis of unspecified posterior cerebral artery: Secondary | ICD-10-CM | POA: Diagnosis not present

## 2016-05-30 DIAGNOSIS — E039 Hypothyroidism, unspecified: Secondary | ICD-10-CM | POA: Diagnosis present

## 2016-05-30 DIAGNOSIS — R45851 Suicidal ideations: Secondary | ICD-10-CM | POA: Diagnosis not present

## 2016-05-30 DIAGNOSIS — E1149 Type 2 diabetes mellitus with other diabetic neurological complication: Secondary | ICD-10-CM

## 2016-05-30 DIAGNOSIS — Z7982 Long term (current) use of aspirin: Secondary | ICD-10-CM | POA: Diagnosis not present

## 2016-05-30 DIAGNOSIS — Z91199 Patient's noncompliance with other medical treatment and regimen due to unspecified reason: Secondary | ICD-10-CM

## 2016-05-30 DIAGNOSIS — R791 Abnormal coagulation profile: Secondary | ICD-10-CM | POA: Diagnosis present

## 2016-05-30 DIAGNOSIS — Z7901 Long term (current) use of anticoagulants: Secondary | ICD-10-CM | POA: Diagnosis not present

## 2016-05-30 DIAGNOSIS — F01518 Vascular dementia, unspecified severity, with other behavioral disturbance: Secondary | ICD-10-CM | POA: Diagnosis present

## 2016-05-30 DIAGNOSIS — J410 Simple chronic bronchitis: Secondary | ICD-10-CM | POA: Diagnosis not present

## 2016-05-30 DIAGNOSIS — Z811 Family history of alcohol abuse and dependence: Secondary | ICD-10-CM | POA: Diagnosis not present

## 2016-05-30 DIAGNOSIS — F0391 Unspecified dementia with behavioral disturbance: Secondary | ICD-10-CM | POA: Diagnosis not present

## 2016-05-30 DIAGNOSIS — E1159 Type 2 diabetes mellitus with other circulatory complications: Secondary | ICD-10-CM | POA: Diagnosis not present

## 2016-05-30 DIAGNOSIS — F0150 Vascular dementia without behavioral disturbance: Secondary | ICD-10-CM | POA: Diagnosis not present

## 2016-05-30 HISTORY — DX: Chronic obstructive pulmonary disease, unspecified: J44.9

## 2016-05-30 HISTORY — DX: Other nonrheumatic aortic valve disorders: I35.8

## 2016-05-30 LAB — CBC WITH DIFFERENTIAL/PLATELET
BASOS PCT: 0 %
Basophils Absolute: 0 10*3/uL (ref 0.0–0.1)
EOS ABS: 0.2 10*3/uL (ref 0.0–0.7)
EOS PCT: 3 %
HCT: 43.6 % (ref 36.0–46.0)
HEMOGLOBIN: 13.6 g/dL (ref 12.0–15.0)
Lymphocytes Relative: 42 %
Lymphs Abs: 2.6 10*3/uL (ref 0.7–4.0)
MCH: 27.8 pg (ref 26.0–34.0)
MCHC: 31.2 g/dL (ref 30.0–36.0)
MCV: 89.2 fL (ref 78.0–100.0)
MONOS PCT: 9 %
Monocytes Absolute: 0.6 10*3/uL (ref 0.1–1.0)
NEUTROS PCT: 46 %
Neutro Abs: 2.8 10*3/uL (ref 1.7–7.7)
PLATELETS: 132 10*3/uL — AB (ref 150–400)
RBC: 4.89 MIL/uL (ref 3.87–5.11)
RDW: 14.9 % (ref 11.5–15.5)
WBC: 6.1 10*3/uL (ref 4.0–10.5)

## 2016-05-30 LAB — COMPREHENSIVE METABOLIC PANEL
ALBUMIN: 3.9 g/dL (ref 3.5–5.0)
ALK PHOS: 85 U/L (ref 38–126)
ALT: 23 U/L (ref 14–54)
ANION GAP: 8 (ref 5–15)
AST: 31 U/L (ref 15–41)
BUN: 26 mg/dL — ABNORMAL HIGH (ref 6–20)
CALCIUM: 9.3 mg/dL (ref 8.9–10.3)
CHLORIDE: 109 mmol/L (ref 101–111)
CO2: 24 mmol/L (ref 22–32)
Creatinine, Ser: 1.01 mg/dL — ABNORMAL HIGH (ref 0.44–1.00)
GFR calc non Af Amer: 57 mL/min — ABNORMAL LOW (ref >60)
GLUCOSE: 100 mg/dL — AB (ref 65–99)
Potassium: 3.7 mmol/L (ref 3.5–5.1)
SODIUM: 141 mmol/L (ref 135–145)
Total Bilirubin: 0.6 mg/dL (ref 0.3–1.2)
Total Protein: 7.7 g/dL (ref 6.5–8.1)

## 2016-05-30 LAB — URINALYSIS, ROUTINE W REFLEX MICROSCOPIC
Bilirubin Urine: NEGATIVE
Glucose, UA: NEGATIVE mg/dL
Hgb urine dipstick: NEGATIVE
KETONES UR: NEGATIVE mg/dL
LEUKOCYTES UA: NEGATIVE
Nitrite: NEGATIVE
PROTEIN: NEGATIVE mg/dL
Specific Gravity, Urine: 1.014 (ref 1.005–1.030)
pH: 5 (ref 5.0–8.0)

## 2016-05-30 LAB — I-STAT TROPONIN, ED: TROPONIN I, POC: 0.06 ng/mL (ref 0.00–0.08)

## 2016-05-30 LAB — PROTIME-INR
INR: 1.32
PROTHROMBIN TIME: 16.5 s — AB (ref 11.4–15.2)

## 2016-05-30 MED ORDER — WARFARIN - PHARMACIST DOSING INPATIENT
Freq: Every day | Status: DC
  Administered 2016-06-01: 17:00:00

## 2016-05-30 MED ORDER — WARFARIN SODIUM 5 MG PO TABS
5.0000 mg | ORAL_TABLET | Freq: Once | ORAL | Status: AC
  Administered 2016-05-30: 5 mg via ORAL
  Filled 2016-05-30 (×2): qty 1

## 2016-05-30 MED ORDER — HYDRALAZINE HCL 20 MG/ML IJ SOLN
5.0000 mg | Freq: Four times a day (QID) | INTRAMUSCULAR | Status: DC | PRN
  Administered 2016-05-31: 5 mg via INTRAVENOUS
  Filled 2016-05-30: qty 1

## 2016-05-30 MED ORDER — ASPIRIN 325 MG PO TABS
325.0000 mg | ORAL_TABLET | Freq: Every day | ORAL | Status: DC
  Administered 2016-05-31 – 2016-06-04 (×5): 325 mg via ORAL
  Filled 2016-05-30 (×5): qty 1

## 2016-05-30 MED ORDER — ASPIRIN 81 MG PO CHEW
324.0000 mg | CHEWABLE_TABLET | Freq: Once | ORAL | Status: AC
  Administered 2016-05-30: 324 mg via ORAL
  Filled 2016-05-30: qty 4
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
## 2016-05-30 NOTE — ED Notes (Signed)
Attempted IV insertion x1 unsuccessful. Another RN to attempt IV insertion.

## 2016-05-30 NOTE — ED Triage Notes (Addendum)
Pt reports her home health nurse got a BP in the 190s. Has had dizziness and pressure over R eye. Has had impaired vision over R eye. Had pressure over eye a few days ago.  Family also requests social work consult for poor condition of house.

## 2016-05-30 NOTE — ED Notes (Signed)
Delay on EKG social worker is in the room.

## 2016-05-30 NOTE — Progress Notes (Signed)
ANTICOAGULATION CONSULT NOTE - Initial Consult  Pharmacy Consult for warfarin Indication: stroke  No Known Allergies  Patient Measurements: Height: 5\' 5"  (165.1 cm) IBW/kg (Calculated) : 57 Heparin Dosing Weight:   Vital Signs: Temp: 97.8 F (36.6 C) (05/04 1840) Temp Source: Oral (05/04 1439) BP: 151/76 (05/04 1955) Pulse Rate: 59 (05/04 1955)  Labs:  Recent Labs  05/30/16 1712 05/30/16 1812  HGB  --  13.6  HCT  --  43.6  PLT  --  132*  LABPROT 16.5*  --   INR 1.32  --   CREATININE  --  1.01*    CrCl cannot be calculated (Unknown ideal weight.).   Medical History: Past Medical History:  Diagnosis Date  . COPD (chronic obstructive pulmonary disease) (HCC)    on CT scan 06/28/2015  . Dementia   . Diabetes mellitus   . Hypertension   . Hypothyroidism   . Idiopathic acute pancreatitis 09/16/2011  . 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 Memory loss   . Pancreatitis 2010  . Tobacco abuse   . Trigger finger     Assessment: [name] YOF presents with blurred vision.  She has h/o lacunar stroke and supposed to be on warfarin and ASA but noncompliant with warfarin.  CT brain this admission reveals L occipital infarct.  Neurology recommends warfarin and ASA. INR at admission = 1.32.  Today, 05/30/2016   INR subtherapeutic (not taking warfarin)  CBC: Hgb WNL, pltc = 132  Goal of Therapy:  INR 2-3    Plan:   Warfarin 5mg  PO x 1 tonight  Daily INR  Watch platelets  Provide re-education as needed  Juliette Alcideustin Zeigler, PharmD, BCPS.   Pager: [phone] 05/30/2016 8:17 PM

## 2016-05-30 NOTE — ED Notes (Signed)
Called 5c 2x without response

## 2016-05-30 NOTE — Progress Notes (Addendum)
CSW met with patient and friend, [name], via bedside. Patient and friend stated they were concerned about patient returning back home because patients "home was messy". Patient stated she likes to shop at [facility] and doesn't have any room to walk in her house anymore. Patient states her SO had both legs amputated last week and is staying in a rehab facility. Per friend, before SO had amputations SO was using the bathroom "where ever he could". Patient and friend stated patient has dementia and is very forgetful. CSW explained process of APS report and that family/ friend could make report for patient if needed. Patient friend stated "her home health service told us to say we couldn't take her home because her house is so messy. So what do we do since we cant take her home?" CSW explained that ED does not place patients if stable for discharge.   5:57PM After speaking with MD who stated patient has advanced dementia CSW completed APS report for patient.   Kingsley Spittle, LCSWA Clinical Social Worker [phone]

## 2016-05-30 NOTE — ED Notes (Signed)
2nd RN unable to obtain blood sample.

## 2016-05-31 ENCOUNTER — Inpatient Hospital Stay (HOSPITAL_COMMUNITY): Payer: Medicare Other

## 2016-05-31 ENCOUNTER — Encounter (HOSPITAL_COMMUNITY): Payer: Self-pay | Admitting: Radiology

## 2016-05-31 DIAGNOSIS — I358 Other nonrheumatic aortic valve disorders: Secondary | ICD-10-CM | POA: Diagnosis present

## 2016-05-31 DIAGNOSIS — F0150 Vascular dementia without behavioral disturbance: Secondary | ICD-10-CM

## 2016-05-31 DIAGNOSIS — I639 Cerebral infarction, unspecified: Secondary | ICD-10-CM | POA: Diagnosis present

## 2016-05-31 DIAGNOSIS — Z7901 Long term (current) use of anticoagulants: Secondary | ICD-10-CM

## 2016-05-31 LAB — PROTIME-INR
INR: 1.17
Prothrombin Time: 15 seconds (ref 11.4–15.2)

## 2016-05-31 LAB — COMPREHENSIVE METABOLIC PANEL
ALK PHOS: 74 U/L (ref 38–126)
ALT: 24 U/L (ref 14–54)
AST: 30 U/L (ref 15–41)
Albumin: 3.3 g/dL — ABNORMAL LOW (ref 3.5–5.0)
Anion gap: 12 (ref 5–15)
BUN: 22 mg/dL — AB (ref 6–20)
CALCIUM: 9.3 mg/dL (ref 8.9–10.3)
CHLORIDE: 105 mmol/L (ref 101–111)
CO2: 22 mmol/L (ref 22–32)
CREATININE: 0.93 mg/dL (ref 0.44–1.00)
GFR calc Af Amer: >60 mL/min (ref >60)
Glucose, Bld: 97 mg/dL (ref 65–99)
Potassium: 4 mmol/L (ref 3.5–5.1)
Sodium: 139 mmol/L (ref 135–145)
Total Bilirubin: 0.8 mg/dL (ref 0.3–1.2)
Total Protein: 7.1 g/dL (ref 6.5–8.1)

## 2016-05-31 LAB — GLUCOSE, CAPILLARY
GLUCOSE-CAPILLARY: 89 mg/dL (ref 65–99)
Glucose-Capillary: 103 mg/dL — ABNORMAL HIGH (ref 65–99)
Glucose-Capillary: 141 mg/dL — ABNORMAL HIGH (ref 65–99)
Glucose-Capillary: 136 mg/dL — ABNORMAL HIGH (ref 65–99)

## 2016-05-31 LAB — TSH: TSH: 4.816 u[IU]/mL — AB (ref 0.350–4.500)

## 2016-05-31 LAB — LIPID PANEL
CHOLESTEROL: 160 mg/dL (ref 0–200)
HDL: 44 mg/dL (ref >40)
LDL Cholesterol: 101 mg/dL — ABNORMAL HIGH (ref 0–99)
Total CHOL/HDL Ratio: 3.6 RATIO
Triglycerides: 76 mg/dL (ref <150)
VLDL: 15 mg/dL (ref 0–40)

## 2016-05-31 LAB — CBC
HEMATOCRIT: 42.1 % (ref 36.0–46.0)
Hemoglobin: 13.3 g/dL (ref 12.0–15.0)
MCH: 27.1 pg (ref 26.0–34.0)
MCHC: 31.6 g/dL (ref 30.0–36.0)
MCV: 85.9 fL (ref 78.0–100.0)
PLATELETS: 131 10*3/uL — AB (ref 150–400)
RBC: 4.9 MIL/uL (ref 3.87–5.11)
RDW: 15 % (ref 11.5–15.5)
WBC: 5.5 10*3/uL (ref 4.0–10.5)

## 2016-05-31 MED ORDER — ATORVASTATIN CALCIUM 80 MG PO TABS
80.0000 mg | ORAL_TABLET | Freq: Every day | ORAL | Status: DC
  Administered 2016-05-31 – 2016-06-03 (×4): 80 mg via ORAL
  Filled 2016-05-31 (×4): qty 1

## 2016-05-31 MED ORDER — SENNOSIDES-DOCUSATE SODIUM 8.6-50 MG PO TABS: 1.0000 | ORAL_TABLET | Freq: Every evening | ORAL | Status: DC | PRN

## 2016-05-31 MED ORDER — SERTRALINE HCL 100 MG PO TABS
100.0000 mg | ORAL_TABLET | Freq: Two times a day (BID) | ORAL | Status: DC
  Administered 2016-05-31 – 2016-06-04 (×9): 100 mg via ORAL
  Filled 2016-05-31 (×9): qty 1

## 2016-05-31 MED ORDER — METFORMIN HCL 500 MG PO TABS: 500.0000 mg | ORAL_TABLET | Freq: Every day | ORAL | Status: DC

## 2016-05-31 MED ORDER — IOPAMIDOL (ISOVUE-370) INJECTION 76%
INTRAVENOUS | Status: AC
  Administered 2016-05-31: 50 mL
  Filled 2016-05-31: qty 50

## 2016-05-31 MED ORDER — AMLODIPINE BESYLATE 10 MG PO TABS
10.0000 mg | ORAL_TABLET | Freq: Every day | ORAL | Status: DC
  Administered 2016-06-01 – 2016-06-04 (×4): 10 mg via ORAL
  Filled 2016-05-31 (×4): qty 1

## 2016-05-31 MED ORDER — ACETAMINOPHEN 325 MG PO TABS
650.0000 mg | ORAL_TABLET | ORAL | Status: DC | PRN
  Administered 2016-05-31 (×2): 650 mg via ORAL
  Filled 2016-05-31 (×2): qty 2

## 2016-05-31 MED ORDER — OXYCODONE HCL 5 MG PO TABS
5.0000 mg | ORAL_TABLET | ORAL | Status: DC | PRN
  Administered 2016-05-31 (×3): 5 mg via ORAL
  Filled 2016-05-31 (×3): qty 1

## 2016-05-31 MED ORDER — DONEPEZIL HCL 10 MG PO TABS
10.0000 mg | ORAL_TABLET | Freq: Every day | ORAL | Status: DC
  Administered 2016-05-31 – 2016-06-03 (×4): 10 mg via ORAL
  Filled 2016-05-31 (×4): qty 1

## 2016-05-31 MED ORDER — WARFARIN SODIUM 5 MG PO TABS
5.0000 mg | ORAL_TABLET | Freq: Once | ORAL | Status: AC
  Administered 2016-05-31: 5 mg via ORAL
  Filled 2016-05-31: qty 1

## 2016-05-31 MED ORDER — INSULIN ASPART 100 UNIT/ML ~~LOC~~ SOLN
0.0000 [IU] | Freq: Three times a day (TID) | SUBCUTANEOUS | Status: DC
  Administered 2016-05-31 – 2016-06-02 (×2): 1 [IU] via SUBCUTANEOUS

## 2016-05-31 MED ORDER — ENOXAPARIN SODIUM 150 MG/ML ~~LOC~~ SOLN
1.5000 mg/kg | SUBCUTANEOUS | Status: DC
  Administered 2016-05-31 – 2016-06-04 (×4): 135 mg via SUBCUTANEOUS
  Filled 2016-05-31 (×5): qty 0.91

## 2016-05-31 MED ORDER — MIRTAZAPINE 15 MG PO TABS
15.0000 mg | ORAL_TABLET | Freq: Every day | ORAL | Status: DC | PRN
  Administered 2016-05-31: 15 mg via ORAL
  Filled 2016-05-31: qty 1

## 2016-05-31 MED ORDER — ACETAMINOPHEN 160 MG/5ML PO SOLN: 650.0000 mg | ORAL | Status: DC | PRN

## 2016-05-31 MED ORDER — AMLODIPINE BESYLATE 5 MG PO TABS: 5.0000 mg | ORAL_TABLET | Freq: Every day | ORAL | Status: DC

## 2016-05-31 MED ORDER — STROKE: EARLY STAGES OF RECOVERY BOOK
Freq: Once | Status: AC
  Administered 2016-05-31: 1
  Filled 2016-05-31: qty 1

## 2016-05-31 MED ORDER — ACETAMINOPHEN 650 MG RE SUPP: 650.0000 mg | RECTAL | Status: DC | PRN

## 2016-05-31 MED ORDER — LISINOPRIL 20 MG PO TABS
20.0000 mg | ORAL_TABLET | Freq: Every day | ORAL | Status: DC
Start: 2016-05-31 — End: 2016-06-04
  Administered 2016-06-01 – 2016-06-04 (×4): 20 mg via ORAL
  Filled 2016-05-31 (×5): qty 1

## 2016-05-31 MED ORDER — INSULIN ASPART 100 UNIT/ML ~~LOC~~ SOLN: 0.0000 [IU] | Freq: Every day | SUBCUTANEOUS | Status: DC

## 2016-05-31 MED ORDER — ATORVASTATIN CALCIUM 40 MG PO TABS: 40.0000 mg | ORAL_TABLET | Freq: Every day | ORAL | Status: DC

## 2016-05-31 MED ORDER — SODIUM CHLORIDE 0.9 % IV SOLN
INTRAVENOUS | Status: AC
  Administered 2016-05-31: 50 mL/h via INTRAVENOUS

## 2016-05-31 MED ORDER — LEVOTHYROXINE SODIUM 50 MCG PO TABS
50.0000 ug | ORAL_TABLET | Freq: Every day | ORAL | Status: DC
  Administered 2016-05-31 – 2016-06-01 (×2): 50 ug via ORAL
  Filled 2016-05-31 (×2): qty 1

## 2016-05-31 MED ORDER — HALOPERIDOL LACTATE 5 MG/ML IJ SOLN
1.0000 mg | Freq: Four times a day (QID) | INTRAMUSCULAR | Status: DC | PRN
  Administered 2016-05-31: 1 mg via INTRAMUSCULAR
  Filled 2016-05-31: qty 1
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
## 2016-05-31 NOTE — Progress Notes (Signed)
Patient arrived to unit alert and orient x4 but with some STMD history of early dementia. No evidence of skin breakdown wearing depends.Vital signs charted in Epic Telemetry box placed
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
## 2016-05-31 NOTE — Progress Notes (Signed)
ANTICOAGULATION CONSULT NOTE - follow up Consult  Pharmacy Consult for warfarin + lovenox bridge Indication: stroke (ischemic)  No Known Allergies  Patient Measurements: Height: 5\' 5"  (165.1 cm) Weight: 199 lb 11.8 oz (90.6 kg) IBW/kg (Calculated) : 57 Heparin Dosing Weight:   Vital Signs: Temp: 97.7 F (36.5 C) (05/05 0559) Temp Source: Oral (05/05 0559) BP: 129/69 (05/05 1000) Pulse Rate: 66 (05/05 1000)  Labs:  Recent Labs  05/30/16 1712 05/30/16 1812 05/31/16 0427 05/31/16 1156  HGB  --  13.6  --  13.3  HCT  --  43.6  --  42.1  PLT  --  132*  --  131*  LABPROT 16.5*  --   --  15.0  INR 1.32  --   --  1.17  CREATININE  --  1.01* 0.93  --     Estimated Creatinine Clearance: 67 mL/min (by C-G formula based on SCr of 0.93 mg/dL).   Medical History: Past Medical History:  Diagnosis Date  . COPD (chronic obstructive pulmonary disease) (HCC)    on CT scan 06/28/2015  . Dementia   . Diabetes mellitus   . Hypertension   . Hypothyroidism   . Idiopathic acute pancreatitis 09/16/2011  . 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 Memory loss   . Pancreatitis 2010  . Tobacco abuse   . Trigger finger     Assessment: 65 YOF presents with new ischemic L occipital infarct.  Pt has h/o lacunar stroke and supposed to be on warfarin and ASA but noncompliant with warfarin. PTA dose supposed to warfarin 5 mg PO daily.  INR subtherapeutic on admission therefore pharmacy consulted to dose warfarin + lovenox bridge. Patient is also on ASA 325 daily per neurology.   Today is D#1 of overlap. INR subtherapeutic at 1.17, CBC stable, no bleeding noted.   Goal of Therapy:  INR 2-3  Monitoring platelets while on anticoagulation: Yes  Plan:   Warfarin 5mg  PO x 1 tonight  Lovenox 1.5 mg/kg q24h  Daily INR, CBC  Provide re-education as needed  Allena Katzaroline E Toryn Dewalt, Pharm.D. PGY1 Pharmacy Resident 5/5/201812:49 PM Pager [phone]
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
## 2016-05-31 NOTE — Progress Notes (Signed)
Late note: Patient's friend Kenney Houseman(Tanya- [phone]) called to say that another friend called to tell her that patient was trying to leave. Ms. Kenney Housemananya states that Patient cannot be allowed to leave because "she has threatened to kill herself and will be a danger to herself". Ms. Caroll Rancheranya Price repeated the above statement multiple times and said the MD can all her if needed she stated "I will verify myself because this is what she has been saying" "There will not be safe, there's no one to go home to"  She left her phone number(see above) for further questions.
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
## 2016-06-01 DIAGNOSIS — F01518 Vascular dementia, unspecified severity, with other behavioral disturbance: Secondary | ICD-10-CM | POA: Diagnosis present

## 2016-06-01 DIAGNOSIS — Z87891 Personal history of nicotine dependence: Secondary | ICD-10-CM

## 2016-06-01 DIAGNOSIS — F0151 Vascular dementia with behavioral disturbance: Secondary | ICD-10-CM | POA: Diagnosis present

## 2016-06-01 DIAGNOSIS — Z811 Family history of alcohol abuse and dependence: Secondary | ICD-10-CM

## 2016-06-01 DIAGNOSIS — F0391 Unspecified dementia with behavioral disturbance: Secondary | ICD-10-CM

## 2016-06-01 LAB — GLUCOSE, CAPILLARY
GLUCOSE-CAPILLARY: 150 mg/dL — AB (ref 65–99)
Glucose-Capillary: 100 mg/dL — ABNORMAL HIGH (ref 65–99)
Glucose-Capillary: 130 mg/dL — ABNORMAL HIGH (ref 65–99)
Glucose-Capillary: 98 mg/dL (ref 65–99)

## 2016-06-01 LAB — T4, FREE: Free T4: 1.13 ng/dL — ABNORMAL HIGH (ref 0.61–1.12)

## 2016-06-01 LAB — PROTIME-INR
INR: 1.16
PROTHROMBIN TIME: 14.8 s (ref 11.4–15.2)

## 2016-06-01 LAB — VITAMIN B12: VITAMIN B 12: 567 pg/mL (ref 180–914)

## 2016-06-01 LAB — CBC
HEMATOCRIT: 40 % (ref 36.0–46.0)
HEMOGLOBIN: 12.8 g/dL (ref 12.0–15.0)
MCH: 27.4 pg (ref 26.0–34.0)
MCHC: 32 g/dL (ref 30.0–36.0)
MCV: 85.5 fL (ref 78.0–100.0)
Platelets: 137 10*3/uL — ABNORMAL LOW (ref 150–400)
RBC: 4.68 MIL/uL (ref 3.87–5.11)
RDW: 15.1 % (ref 11.5–15.5)
WBC: 4.9 10*3/uL (ref 4.0–10.5)

## 2016-06-01 LAB — HEMOGLOBIN A1C
Hgb A1c MFr Bld: 6.5 % — ABNORMAL HIGH (ref 4.8–5.6)
Mean Plasma Glucose: 140 mg/dL

## 2016-06-01 MED ORDER — QUETIAPINE FUMARATE 25 MG PO TABS
25.0000 mg | ORAL_TABLET | Freq: Every day | ORAL | Status: DC
  Administered 2016-06-01 – 2016-06-03 (×3): 25 mg via ORAL
  Filled 2016-06-01 (×3): qty 1

## 2016-06-01 MED ORDER — LEVOTHYROXINE SODIUM 75 MCG PO TABS
75.0000 ug | ORAL_TABLET | Freq: Every day | ORAL | Status: DC
  Administered 2016-06-02 – 2016-06-04 (×3): 75 ug via ORAL
  Filled 2016-06-01 (×3): qty 1

## 2016-06-01 MED ORDER — WARFARIN SODIUM 7.5 MG PO TABS
7.5000 mg | ORAL_TABLET | Freq: Once | ORAL | Status: AC
  Administered 2016-06-01: 7.5 mg via ORAL
  Filled 2016-06-01: qty 1

## 2016-06-01 NOTE — Progress Notes (Signed)
Sitter has been in pt's room all thru out this shift. Pt is pleasant and calm today, a friend of hers, [name]  Came to visit and this sems to have kept pt calm.

## 2016-06-01 NOTE — Progress Notes (Signed)
Rehab Admissions Coordinator Note:  Patient was screened by Trish MageLogue, Yosgar Demirjian M for appropriateness for an Inpatient Acute Rehab Consult.  At this time, we are recommending Inpatient Rehab consult.  Trish MageLogue, Sachin Ferencz M 06/01/2016, 7:58 AM  I can be reached at [phone].

## 2016-06-01 NOTE — Progress Notes (Signed)
Occupational Therapy Treatment Patient Details Name: [name] MRN: [id number] DOB: 06/28/1950 Today's Date: 06/01/2016    History of present illness 66 yo female admitted with L occipitial infarct  PMH: dementia,.CVA HTN DM   OT comments  Pt progressing toward OT goals this session. She demonstrates poor problem solving, sequencing, and motor planning skills this session when completing standing grooming tasks requiring verbal cues. Pt was able to complete toilet transfers with close supervision this session. Updated goals to reflect progress as pt had met 3/4. She continues to demonstrate R visual field deficits and utilized anchoring techniques to address this during session with moderate success. D/C plan remains appropriate. OT will continue to follow while admitted.  Follow Up Recommendations  CIR    Equipment Recommendations  None recommended by OT    Recommendations for Other Services Rehab consult    Precautions / Restrictions Precautions Precautions: Fall Restrictions Weight Bearing Restrictions: No       Mobility Bed Mobility               General bed mobility comments: received in chair  Transfers Overall transfer level: Needs assistance Equipment used: None Transfers: Sit to/from Stand Sit to Stand: Supervision         General transfer comment: Close supervision for safety.    Balance Overall balance assessment: Needs assistance Sitting-balance support: No upper extremity supported;Feet supported Sitting balance-Leahy Scale: Good     Standing balance support: Single extremity supported;No upper extremity supported;During functional activity Standing balance-Leahy Scale: Fair Standing balance comment: Close supervision for safety.                           ADL either performed or assessed with clinical judgement   ADL Overall ADL's : Needs assistance/impaired     Grooming: Min guard;Cueing for sequencing;Cueing for  safety;Standing;Wash/dry face;Wash/dry hands Grooming Details (indicate cue type and reason): Requiring cues for sequencing and problem solving when determining which handle controlled the hot vs. cold water. Additionally poor motor planning attempting to wet washcloth prior to turning on water.                 Toilet Transfer: Supervision/safety;Ambulation Toilet Transfer Details (indicate cue type and reason): Close supervision and verbal cues for R sided vision and head turns as well as to avoid running into computer and wall.         Functional mobility during ADLs: Supervision/safety General ADL Comments: When attempting to read room number, pt missing R side of number sequence. Utilized anchoring techniques to improve this with moderate success.     Vision   Vision Assessment?: Yes Eye Alignment: Within Functional Limits Ocular Range of Motion: Impaired-to be further tested in functional context Tracking/Visual Pursuits: Unable to hold eye position out of midline Visual Fields: Right visual field deficit Additional Comments: Unable to hold eye position out of midline bilaterally. Missing half of room number when attempting to read sign outside of door.   Perception     Praxis      Cognition Arousal/Alertness: Awake/alert Behavior During Therapy: WFL for tasks assessed/performed Overall Cognitive Status: History of cognitive impairments - at baseline                                 General Comments: Pt conversational and calm throughout session but wary of therapist's questions. Oriented to place but reporting today is  Friday and the year is 2000.        Exercises     Shoulder Instructions       General Comments      Pertinent Vitals/ Pain       Pain Assessment: No/denies pain  Home Living                                          Prior Functioning/Environment              Frequency  Min 2X/week        Progress  Toward Goals  OT Goals(current goals can now be found in the care plan section)  Progress towards OT goals: Progressing toward goals  Acute Rehab OT Goals Patient Stated Goal: none stated at this time OT Goal Formulation: With patient Time For Goal Achievement: 06/14/16 Potential to Achieve Goals: Good ADL Goals Pt Will Perform Grooming: with modified independence;standing Pt Will Perform Upper Body Bathing: with modified independence;sitting Pt Will Transfer to Toilet: with modified independence;ambulating;bedside commode Additional ADL Goal #1: Pt will complete a 3 step navigational direction ( was out the door, turn R and stop at room 5C04 is an example)  Plan Discharge plan remains appropriate    Co-evaluation                 AM-PAC PT "6 Clicks" Daily Activity     Outcome Measure   Help from another person eating meals?: None Help from another person taking care of personal grooming?: A Little Help from another person toileting, which includes using toliet, bedpan, or urinal?: A Little Help from another person bathing (including washing, rinsing, drying)?: A Little Help from another person to put on and taking off regular upper body clothing?: A Little Help from another person to put on and taking off regular lower body clothing?: A Little 6 Click Score: 19    End of Session Equipment Utilized During Treatment: Gait belt  OT Visit Diagnosis: Unsteadiness on feet (R26.81)   Activity Tolerance Patient tolerated treatment well   Patient Left in chair;with call bell/phone within reach;with chair alarm set   Nurse Communication Mobility status        Time: 4356-8616 OT Time Calculation (min): 23 min  Charges: OT General Charges $OT Visit: 1 Procedure OT Treatments $Self Care/Home Management : 8-22 mins $Therapeutic Activity: 8-22 mins  Norman Herrlich, MS OTR/L  Pager: [place] A Yoselyn Mcglade 06/01/2016, 5:30 PM

## 2016-06-01 NOTE — Progress Notes (Signed)
ANTICOAGULATION CONSULT NOTE - follow up Consult  Pharmacy Consult for warfarin + lovenox bridge Indication: stroke (ischemic)  No Known Allergies  Patient Measurements: Height: 5\' 5"  (165.1 cm) Weight: 199 lb 11.8 oz (90.6 kg) IBW/kg (Calculated) : 57 Heparin Dosing Weight:   Vital Signs: Temp: 97.9 F (36.6 C) (05/06 0641) Temp Source: Oral (05/06 0641) BP: 147/77 (05/06 0641) Pulse Rate: 68 (05/06 0641)  Labs:  Recent Labs  05/30/16 1712  05/30/16 1812 05/31/16 0427 05/31/16 1156 06/01/16 0442  HGB  --   < > 13.6  --  13.3 12.8  HCT  --   --  43.6  --  42.1 40.0  PLT  --   --  132*  --  131* 137*  LABPROT 16.5*  --   --   --  15.0 14.8  INR 1.32  --   --   --  1.17 1.16  CREATININE  --   --  1.01* 0.93  --   --   < > = values in this interval not displayed.  Estimated Creatinine Clearance: 67 mL/min (by C-G formula based on SCr of 0.93 mg/dL).   Medical History: Past Medical History:  Diagnosis Date  . COPD (chronic obstructive pulmonary disease) (HCC)    on CT scan 06/28/2015  . Dementia   . Diabetes mellitus   . Hypertension   . Hypothyroidism   . Idiopathic acute pancreatitis 09/16/2011  . 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 Memory loss   . Pancreatitis 2010  . Tobacco abuse   . Trigger finger     Assessment: 65 YOF presents with new ischemic L occipital infarct.  Pt has h/o lacunar stroke and supposed to be on warfarin and ASA but noncompliant with warfarin. PTA dose supposed to warfarin 5 mg PO daily.  INR subtherapeutic on admission therefore pharmacy consulted to dose warfarin + lovenox bridge. Patient is also on ASA 325 daily per neurology.   Today is D#2 of overlap. INR subtherapeutic at 1.16, CBC stable, no bleeding noted.   Goal of Therapy:  INR 2-3  Monitoring platelets while on anticoagulation: Yes  Plan:   Warfarin 7.5mg  PO x 1 tonight  Lovenox 1.5 mg/kg q24h  Daily INR, CBC  Provide re-education as needed  Pollyann SamplesAndy Ellieana Dolecki,  PharmD, BCPS 06/01/2016, 10:34 AM
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
## 2016-06-01 NOTE — Progress Notes (Signed)
[name] slept well through the night. Hypertension resolved itself last night with relaxation.  Last night, she became increasingly confused.  She was wandering and unable to follow simple commands.  Believed voices from outside the room were coming through telephone.  Has to be repeatedly rerouted to room as she wandered looking for her private residence.  We were able to reorient her for 5-10 seconds then she would resume looking for her bed at the nursing station.  Unable to recognize bed, call bell or light switch.  Unable to understand how to make cup and straw work.  Cognition worsened until 2330 when she was coerced into bed.  Fell asleep quickly.
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
## 2016-06-02 ENCOUNTER — Other Ambulatory Visit (HOSPITAL_COMMUNITY): Payer: Medicare Other

## 2016-06-02 DIAGNOSIS — Z9119 Patient's noncompliance with other medical treatment and regimen: Secondary | ICD-10-CM

## 2016-06-02 DIAGNOSIS — Z7901 Long term (current) use of anticoagulants: Secondary | ICD-10-CM

## 2016-06-02 DIAGNOSIS — D696 Thrombocytopenia, unspecified: Secondary | ICD-10-CM | POA: Diagnosis present

## 2016-06-02 DIAGNOSIS — I10 Essential (primary) hypertension: Secondary | ICD-10-CM

## 2016-06-02 DIAGNOSIS — F0151 Vascular dementia with behavioral disturbance: Secondary | ICD-10-CM

## 2016-06-02 DIAGNOSIS — Z91199 Patient's noncompliance with other medical treatment and regimen due to unspecified reason: Secondary | ICD-10-CM

## 2016-06-02 DIAGNOSIS — I639 Cerebral infarction, unspecified: Secondary | ICD-10-CM

## 2016-06-02 DIAGNOSIS — I358 Other nonrheumatic aortic valve disorders: Secondary | ICD-10-CM

## 2016-06-02 DIAGNOSIS — J449 Chronic obstructive pulmonary disease, unspecified: Secondary | ICD-10-CM | POA: Diagnosis present

## 2016-06-02 DIAGNOSIS — F05 Delirium due to known physiological condition: Secondary | ICD-10-CM | POA: Diagnosis present

## 2016-06-02 DIAGNOSIS — Z8673 Personal history of transient ischemic attack (TIA), and cerebral infarction without residual deficits: Secondary | ICD-10-CM

## 2016-06-02 DIAGNOSIS — R45851 Suicidal ideations: Secondary | ICD-10-CM

## 2016-06-02 DIAGNOSIS — E1159 Type 2 diabetes mellitus with other circulatory complications: Secondary | ICD-10-CM

## 2016-06-02 LAB — PROTIME-INR
INR: 1.25
PROTHROMBIN TIME: 15.8 s — AB (ref 11.4–15.2)

## 2016-06-02 LAB — CBC
HCT: 39.1 % (ref 36.0–46.0)
HEMOGLOBIN: 12.5 g/dL (ref 12.0–15.0)
MCH: 27.3 pg (ref 26.0–34.0)
MCHC: 32 g/dL (ref 30.0–36.0)
MCV: 85.4 fL (ref 78.0–100.0)
Platelets: 146 10*3/uL — ABNORMAL LOW (ref 150–400)
RBC: 4.58 MIL/uL (ref 3.87–5.11)
RDW: 15.2 % (ref 11.5–15.5)
WBC: 5.9 10*3/uL (ref 4.0–10.5)

## 2016-06-02 LAB — GLUCOSE, CAPILLARY
GLUCOSE-CAPILLARY: 129 mg/dL — AB (ref 65–99)
GLUCOSE-CAPILLARY: 120 mg/dL — AB (ref 65–99)
Glucose-Capillary: 123 mg/dL — ABNORMAL HIGH (ref 65–99)
Glucose-Capillary: 131 mg/dL — ABNORMAL HIGH (ref 65–99)
Glucose-Capillary: 149 mg/dL — ABNORMAL HIGH (ref 65–99)

## 2016-06-02 LAB — HIV ANTIBODY (ROUTINE TESTING W REFLEX): HIV SCREEN 4TH GENERATION: NONREACTIVE

## 2016-06-02 LAB — RPR: RPR: NONREACTIVE

## 2016-06-02 MED ORDER — WARFARIN SODIUM 5 MG PO TABS
10.0000 mg | ORAL_TABLET | Freq: Once | ORAL | Status: AC
  Administered 2016-06-02: 10 mg via ORAL
  Filled 2016-06-02: qty 2

## 2016-06-02 NOTE — [place] (Signed)
[place] MEDICAID FL2 LEVEL OF CARE SCREENING TOOL     IDENTIFICATION  Patient Name: [name] Birthdate: September 01, 1950 Sex: female Admission Date (Current Location): 05/30/2016  [facility] [place]   Facility and Address:  [facility], [address], [place], [place] [address]      Provider Number: [id number]  Attending Physician Name and Address:  Eddie Northhungel, Nishant, MD  Relative Name and Phone Number:       Current Level of Care: Hospital Recommended Level of Care: Skilled Nursing Facility Prior Approval Number:    Date Approved/Denied:   PASRR Number: [id number]  Discharge Plan: SNF    Current Diagnoses: Patient Active Problem List   Diagnosis Date Noted  . Thrombocytopenia (HCC)   . Suicidal ideation   . Sundowning   . Non-compliance   . Chronic obstructive pulmonary disease (HCC)   . Benign essential HTN   . History of CVA (cerebrovascular accident)   . Dementia with behavioral disturbance   . Occipital stroke (HCC)   . 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 Chronic anticoagulation   . Vertigo   . Stroke (HCC)   . AKI (acute kidney injury) (HCC) 06/28/2015  . Protein-calorie malnutrition, moderate (HCC) 06/28/2015  . Nausea & vomiting 06/28/2015  . Acute CVA (cerebrovascular accident) (HCC) 06/28/2015  . Hypokalemia   . Cerebellar stroke (HCC)   . Emesis   . Anxiety 06/27/2015  . Dizziness 06/27/2015  . Tobacco abuse   . Early onset Alzheimer's dementia without behavioral disturbance 11/03/2014  . Memory deficits 07/03/2014  . Trigger finger, acquired 09/13/2012  . Pain in both wrists 05/12/2012  . Non-proliferative diabetic retinopathy, both eyes (HCC) 05/07/2012  . Colon cancer screening 12/23/2011  . Unintentional weight loss 12/22/2011  . Memory problem 10/31/2011  . Idiopathic acute pancreatitis 10/29/2011  . Major depressive disorder 09/10/2011  . Hypertension 09/10/2011  . Chronic back pain  09/10/2011  . Hyperlipidemia 09/10/2011  . Diabetes mellitus, type 2 (HCC) 09/10/2011    Orientation RESPIRATION BLADDER Height & Weight     Self, Place  Normal Continent Weight: 199 lb 11.8 oz (90.6 kg) Height:  5\' 5"  (165.1 cm)  BEHAVIORAL SYMPTOMS/MOOD NEUROLOGICAL BOWEL NUTRITION STATUS  Other (Comment) (Dementia; Sundowners)   Continent Diet (Carb modified; thin fluids)  AMBULATORY STATUS COMMUNICATION OF NEEDS Skin   Limited Assist Verbally Normal                       Personal Care Assistance Level of Assistance  Bathing, Feeding, Dressing Bathing Assistance: Limited assistance Feeding assistance: Limited assistance Dressing Assistance: Limited assistance     Functional Limitations Info  Sight, Hearing, Speech Sight Info: Adequate Hearing Info: Adequate Speech Info: Adequate    SPECIAL CARE FACTORS FREQUENCY  PT (By licensed PT), OT (By licensed OT)     PT Frequency: 5x OT Frequency: 5x            Contractures Contractures Info: Not present    Additional Factors Info  Code Status, Allergies, Insulin Sliding Scale, Psychotropic Code Status Info: Full Allergies Info: No known allergies Psychotropic Info: See med list Insulin Sliding Scale Info: See med list       Current Medications (06/02/2016):  This is the current hospital active medication list Current Facility-Administered Medications  Medication Dose Route Frequency Provider Last Rate Last Dose  . acetaminophen (TYLENOL) tablet 650 mg  650 mg Oral Q4H PRN Pearson GrippeKim, James, MD  650 mg at 05/31/16 2301   Or  . acetaminophen (TYLENOL) solution 650 mg  650 mg Per Tube Q4H PRN Pearson Grippe, MD       Or  . acetaminophen (TYLENOL) suppository 650 mg  650 mg Rectal Q4H PRN Pearson Grippe, MD      . amLODipine (NORVASC) tablet 10 mg  10 mg Oral Daily Marvel Plan, MD   10 mg at 06/02/16 0843  . aspirin tablet 325 mg  325 mg Oral Daily Pearson Grippe, MD   325 mg at 06/02/16 0843  . atorvastatin (LIPITOR) tablet 80 mg   80 mg Oral QHS Rinehuls, David L, PA-C   80 mg at 06/01/16 2148  . donepezil (ARICEPT) tablet 10 mg  10 mg Oral Farrel Demark, MD   10 mg at 06/01/16 2148  . enoxaparin (LOVENOX) injection 135 mg  1.5 mg/kg Subcutaneous Q24H Allena Katz, RPH   135 mg at 06/02/16 0844  . haloperidol lactate (HALDOL) injection 1 mg  1 mg Intramuscular Q6H PRN Dhungel, Nishant, MD   1 mg at 05/31/16 1756  . hydrALAZINE (APRESOLINE) injection 5 mg  5 mg Intravenous Q6H PRN Pearson Grippe, MD   5 mg at 05/31/16 0600  . insulin aspart (novoLOG) injection 0-5 Units  0-5 Units Subcutaneous QHS Pearson Grippe, MD      . insulin aspart (novoLOG) injection 0-9 Units  0-9 Units Subcutaneous TID WC Pearson Grippe, MD   1 Units at 06/02/16 [phone]  . levothyroxine (SYNTHROID, LEVOTHROID) tablet 75 mcg  75 mcg Oral QAC breakfast Dhungel, Nishant, MD   75 mcg at 06/02/16 0643  . lisinopril (PRINIVIL,ZESTRIL) tablet 20 mg  20 mg Oral Daily Marvel Plan, MD   20 mg at 06/02/16 0843  . mirtazapine (REMERON) tablet 15 mg  15 mg Oral Daily PRN Pearson Grippe, MD   15 mg at 05/31/16 2304  . oxyCODONE (Oxy IR/ROXICODONE) immediate release tablet 5 mg  5 mg Oral Q4H PRN Jinger Neighbors, NP   5 mg at 05/31/16 2302  . QUEtiapine (SEROQUEL) tablet 25 mg  25 mg Oral QHS Dhungel, Nishant, MD   25 mg at 06/01/16 2148  . senna-docusate (Senokot-S) tablet 1 tablet  1 tablet Oral QHS PRN Pearson Grippe, MD      . sertraline (ZOLOFT) tablet 100 mg  100 mg Oral BID Pearson Grippe, MD   100 mg at 06/02/16 1000  . Warfarin - Pharmacist Dosing Inpatient   Does not apply q1800 Aleda Grana, [facility]         Discharge Medications: Please see discharge summary for a list of discharge medications.  Relevant Imaging Results:  Relevant Lab Results:   Additional Information SSN: [id number]  Dominic Pea, LCSW
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
## 2016-06-02 NOTE — Progress Notes (Signed)
Female calling, stating her name is "[name]" requesting information on patient discharge status.  Caller informed no new information at this time, caller repeated question, getting same answer as there is no new information regarding discharge status at this moment.  Caller hung up.

## 2016-06-02 NOTE — Clinical Social Work Note (Signed)
Clinical Social Work Assessment  Patient Details  Name: [name] MRN: [id number] Date of Birth: Jan 07, 1951  Date of referral:  06/02/16               Reason for consult:  Discharge Planning                Permission sought to share information with:  Family Supports, Customer service manager Permission granted to share information::  Yes, Verbal Permission Granted  Name::     Foy [place]  Agency::  SNFs  Relationship::  Cousin/Friend  Contact Information:  [phone]  Housing/Transportation Living arrangements for the past 2 months:  Apartment Source of Information:  Friend/Neighbor Patient Interpreter Needed:  None Criminal Activity/Legal Involvement Pertinent to Current Situation/Hospitalization:  No - Comment as needed Significant Relationships:  Friend Lives with:  Significant Other (Currently in SNF) Do you feel safe going back to the place where you live?  No Need for family participation in patient care:  No (Coment)  Care giving concerns:  No care giving concerns identified.    Social Worker assessment / plan:  CSW met with pt to address consult for new SNF. Dannielle Huh, was present during assessment. CSW introduced herself and explained role of social work. Pt from home and lives with significant other-Ronnie. However, Edd Arbour currently at [facility] for rehab following a bilateral amputation.According to [facility], pt has only a cousin-[name] Jones [phone]). [name] also states pt's home is "uninhabitable." She claims papers, clothes, urine-filled containers, etc are throughout the house. Containers, allegedly, are from [place] who cannot always make it to the bathroom. CSW discussed possible guardianship options and post discharge options. CSW clearly conveyed that [facility] will not manage pt beyond d/c, nor will [facility] participate physically or financially in cleaning pt's home.   P/T is recommending STR at CIR. CIR stated pt  not appropriate because she does not have the support for post discharge. CSW explained discharging to SNF with a Medicare. CSW provided SNF listing for review. Pt agreeable to SNF for STR. Pt agreed for CSW to fax out to [facility] facilities.     CSW will sent FL-2 to SNFs and follow up on potential bed offer. Pt currently IVC'd and with a sitter at bedside. CSW will continue to follow.  Employment status:  Retired Science writer) PT Recommendations:  Inpatient Rehab Consult, [place] / Referral to community resources:  [place]  Patient/Family's Response to care:  Tresa Endo, was thankful for CSW support and guidance.    Patient/Family's Understanding of and Emotional Response to Diagnosis, Current Treatment, and Prognosis:  Pt confused and has memory loss. Pt oriented to person and place.   Emotional Assessment Appearance:  Appears stated age Attitude/Demeanor/Rapport:  Other (Appropriate) Affect (typically observed):  Calm, Pleasant, Accepting, Adaptable Orientation:  Oriented to Self, Oriented to Place Alcohol / Substance use:  Other Psych involvement (Current and /or in the community):  No (Comment) (Recommend SNF; no inpatient psych rec)  Discharge Needs  Concerns to be addressed:  Care Coordination Readmission within the last 30 days:    Current discharge risk:  Cognitively Impaired Barriers to Discharge:  Continued Medical Work up   [employer], LCSW 06/02/2016, 6:46 PM
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
## 2016-06-02 NOTE — Progress Notes (Signed)
ANTICOAGULATION CONSULT NOTE - follow up Consult  Pharmacy Consult for warfarin + lovenox bridge Indication: stroke (ischemic)  No Known Allergies  Patient Measurements: Height: 5\' 5"  (165.1 cm) Weight: 199 lb 11.8 oz (90.6 kg) IBW/kg (Calculated) : 57 Heparin Dosing Weight:   Vital Signs: Temp: 97.6 F (36.4 C) (05/07 0553) Temp Source: Oral (05/07 0553) BP: 158/85 (05/07 0553) Pulse Rate: 72 (05/07 0553)  Labs:  Recent Labs  05/30/16 1812 05/31/16 0427 05/31/16 1156 06/01/16 0442 06/02/16 0454  HGB 13.6  --  13.3 12.8 12.5  HCT 43.6  --  42.1 40.0 39.1  PLT 132*  --  131* 137* 146*  LABPROT  --   --  15.0 14.8 15.8*  INR  --   --  1.17 1.16 1.25  CREATININE 1.01* 0.93  --   --   --     Estimated Creatinine Clearance: 67 mL/min (by C-G formula based on SCr of 0.93 mg/dL).   Medical History: Past Medical History:  Diagnosis Date  . COPD (chronic obstructive pulmonary disease) (HCC)    on CT scan 06/28/2015  . Dementia   . Diabetes mellitus   . Hypertension   . Hypothyroidism   . Idiopathic acute pancreatitis 09/16/2011  . 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 Memory loss   . Pancreatitis 2010  . Tobacco abuse   . Trigger finger     Assessment: 65 YOF presents with new ischemic L occipital infarct.  Pt has h/o lacunar stroke and supposed to be on warfarin and ASA but noncompliant with warfarin. PTA dose supposed to warfarin 5 mg PO daily.  INR subtherapeutic on admission therefore pharmacy consulted to dose warfarin + lovenox bridge. Patient is also on ASA 325 daily per neurology.   Today is D#3 of overlap. INR subtherapeutic at 1.25, CBC stable, no bleeding noted.   Goal of Therapy:  INR 2-3  Monitoring platelets while on anticoagulation: Yes  Plan:   Warfarin 10 mg PO x 1 tonight  Lovenox 1.5 mg/kg q24h  Daily INR, CBC  Provide re-education as needed  Thank you Okey RegalLisa Dewaun Kinzler, PharmD [phone] 06/02/2016, 8:44 AM

## 2016-06-02 NOTE — Progress Notes (Signed)
I met with the patient and her friend [name] [phone]) to discuss caregiver availability as it relates to a potential CIR admission.  Pt. with dementia and inability to make her own decisions.  [name] confirms that pt. does not have a caregiver and was living alone PTA.  Dr. Posey Pronto is recommending SNF at this time due to lack of caregiver support.  I have updated [name], CSW.  I will sign  Off.    [facility] Admissions Coordinator Cell [phone] Office [phone]

## 2016-06-02 NOTE — Progress Notes (Signed)
Physical Therapy Treatment Patient Details Name: Shannon AmenYvonne M Barresi MRN: [id number] DOB: 01-15-1951 Today's Date: 06/02/2016    History of Present Illness 66 yo female admitted with L occipitial infarct  PMH: dementia,.CVA HTN DM    PT Comments    This session was limited due to pt high blood pressure: 150/103 sitting initially, 150/93 sitting 3 min, 160/89 post ambulating 12 ft around bed to chair.  RN notified and arrived to administer medications.  Pt states R visual field blurry and slight dizziness throughout session.  She required min hand-held assist to ambulate.  Attempted peripheral vision assessment but difficult to pinpoint deficits due to inconsistent report from pt. Initially deficits apparent on R, but testing also suggests deficits on L. Recommend reassessing next session.  Current recommendations remain appropriate.  Will continue to follow acutely.    Follow Up Recommendations  CIR;Supervision/Assistance - 24 hour     Equipment Recommendations  None recommended by PT    Recommendations for Other Services Rehab consult     Precautions / Restrictions Precautions Precautions: Fall Restrictions Weight Bearing Restrictions: No    Mobility  Bed Mobility Overal bed mobility: Modified Independent             General bed mobility comments: Extra time to get to EOB but no cueing or physical assist required.  Pt states she is slightly dizzy upon sitting EOB.  BP obtained and extra time required to assess changes in dizziness.  Pt states her R visual field is blurry but denies any changes in vision or dizziness while sitting.  Transfers Overall transfer level: Needs assistance Equipment used: None Transfers: Sit to/from Stand Sit to Stand: Supervision         General transfer comment: Extra time required but no cues or assist needed.  Pt states she is still slightly dizzy upon standing, with no increase or decrease throughout ~30 seconds standing  time.  Ambulation/Gait Ambulation/Gait assistance: Min guard Ambulation Distance (Feet): 12 Feet Assistive device: 1 person hand held assist Gait Pattern/deviations: Step-through pattern;Decreased stride length;Narrow base of support Gait velocity: decreased Gait velocity interpretation: Below normal speed for age/gender General Gait Details: Pt reached for foot of bed while walking around it and offered hand held assist.  Pt utilized HHA but did not push through hand for support.   Stairs            Wheelchair Mobility    Modified Rankin (Stroke Patients Only) Modified Rankin (Stroke Patients Only) Pre-Morbid Rankin Score: No symptoms Modified Rankin: Moderate disability     Balance Overall balance assessment: Needs assistance Sitting-balance support: No upper extremity supported;Feet supported Sitting balance-Leahy Scale: Good     Standing balance support: No upper extremity supported;Single extremity supported Standing balance-Leahy Scale: Fair                              Cognition Arousal/Alertness: Awake/alert Behavior During Therapy: WFL for tasks assessed/performed;Flat affect Overall Cognitive Status: History of cognitive impairments - at baseline                                 General Comments: Pt states she is in a doctor's office and it's Tuesday      Exercises      General Comments General comments (skin integrity, edema, etc.): Sitter present.  RN notified of pt's high BP during session and arrived to  administer medications.      Pertinent Vitals/Pain Pain Assessment: Faces Faces Pain Scale: Hurts little more Pain Location: Legs Pain Descriptors / Indicators: Sore Pain Intervention(s): Monitored during session;Repositioned    Home Living                      Prior Function            PT Goals (current goals can now be found in the care plan section) Acute Rehab PT Goals Patient Stated Goal: none  stated at this time PT Goal Formulation: With patient Time For Goal Achievement: 06/14/16 Potential to Achieve Goals: Good Progress towards PT goals: Progressing toward goals    Frequency    Min 3X/week      PT Plan Current plan remains appropriate    Co-evaluation              AM-PAC PT "6 Clicks" Daily Activity  Outcome Measure  Difficulty turning over in bed (including adjusting bedclothes, sheets and blankets)?: A Little Difficulty moving from lying on back to sitting on the side of the bed? : A Little Difficulty sitting down on and standing up from a chair with arms (e.g., wheelchair, bedside commode, etc,.)?: A Little Help needed moving to and from a bed to chair (including a wheelchair)?: A Little Help needed walking in hospital room?: A Little Help needed climbing 3-5 steps with a railing? : A Little 6 Click Score: 18    End of Session Equipment Utilized During Treatment: Gait belt Activity Tolerance: Patient tolerated treatment well Patient left: in chair;with call bell/phone within reach;with chair alarm set;with nursing/sitter in room Nurse Communication: Mobility status;Other (comment) (Pt blood pressure) PT Visit Diagnosis: Unsteadiness on feet (R26.81);Other symptoms and signs involving the nervous system (R29.898)     Time: 1610-9604 PT Time Calculation (min) (ACUTE ONLY): 20 min  Charges:  $Therapeutic Activity: 8-22 mins                    G Codes:        Willaim Rayas SPT   Willaim Rayas 06/02/2016, 1:24 PM

## 2016-06-02 NOTE — Care Management Note (Signed)
Case Management Note  Patient Details  Name: Shannon AmenYvonne M Tomassetti MRN: [id number] Date of Birth: Sep 03, 1950  Subjective/Objective:   Pt in with confusion. She is from home alone.                 Action/Plan: PT/OT recommending CIR. CM following for d/c disposition.  Expected Discharge Date:   (unknown)               Expected Discharge Plan:  Skilled Nursing Facility  In-House Referral:  Clinical Social Work  Discharge planning Services     Post Acute Care Choice:    Choice offered to:     DME Arranged:    DME Agency:     HH Arranged:    HH Agency:     Status of Service:  In process, will continue to follow  If discussed at Long Length of Stay Meetings, dates discussed:    Additional Comments:  Kermit BaloKelli F Harmon Bommarito, RN 06/02/2016, 3:20 PM

## 2016-06-03 ENCOUNTER — Other Ambulatory Visit (HOSPITAL_COMMUNITY): Payer: Medicare Other

## 2016-06-03 LAB — CBC
HEMATOCRIT: 38.2 % (ref 36.0–46.0)
Hemoglobin: 12.3 g/dL (ref 12.0–15.0)
MCH: 27.4 pg (ref 26.0–34.0)
MCHC: 32.2 g/dL (ref 30.0–36.0)
MCV: 85.1 fL (ref 78.0–100.0)
Platelets: 140 10*3/uL — ABNORMAL LOW (ref 150–400)
RBC: 4.49 MIL/uL (ref 3.87–5.11)
RDW: 15.3 % (ref 11.5–15.5)
WBC: 5.5 10*3/uL (ref 4.0–10.5)

## 2016-06-03 LAB — GLUCOSE, CAPILLARY
GLUCOSE-CAPILLARY: 110 mg/dL — AB (ref 65–99)
Glucose-Capillary: 110 mg/dL — ABNORMAL HIGH (ref 65–99)
Glucose-Capillary: 104 mg/dL — ABNORMAL HIGH (ref 65–99)
Glucose-Capillary: 112 mg/dL — ABNORMAL HIGH (ref 65–99)

## 2016-06-03 LAB — PROTIME-INR
INR: 1.58
PROTHROMBIN TIME: 19 s — AB (ref 11.4–15.2)

## 2016-06-03 MED ORDER — ENOXAPARIN SODIUM 150 MG/ML ~~LOC~~ SOLN: 1.5000 mg/kg | SUBCUTANEOUS | 0 refills | Status: DC

## 2016-06-03 MED ORDER — WARFARIN SODIUM 5 MG PO TABS
10.0000 mg | ORAL_TABLET | Freq: Once | ORAL | Status: AC
  Administered 2016-06-03: 10 mg via ORAL
  Filled 2016-06-03: qty 2

## 2016-06-03 MED ORDER — LEVOTHYROXINE SODIUM 75 MCG PO TABS: 75.0000 ug | ORAL_TABLET | Freq: Every day | ORAL | 0 refills | Status: DC

## 2016-06-03 MED ORDER — QUETIAPINE FUMARATE 25 MG PO TABS: 25.0000 mg | ORAL_TABLET | Freq: Every day | ORAL | 0 refills | Status: DC

## 2016-06-03 MED ORDER — WARFARIN SODIUM 5 MG PO TABS: 10.0000 mg | ORAL_TABLET | Freq: Every day | ORAL | 0 refills | Status: DC

## 2016-06-03 NOTE — Progress Notes (Signed)
Occupational Therapy Treatment Patient Details Name: [name] MRN: [id number] DOB: [birth year] Today's Date: 06/03/2016    History of present illness 66 yo female admitted with L occipitial infarct  PMH: dementia,.CVA HTN DM   OT comments  Pt progressing toward OT goals. This session, pt with decreased dynamic standing balance requiring min assist with toilet transfers. She was able to complete functional reaching tasks to challenge standing balance to transfer self-care items from low shelf to high shelf with min guard assist for safety. She continues to demonstrate difficulty identifying items in her R visual field but is demonstrating some improvement in awareness of these deficits. Updated D/C plan for SNF level rehabilitation post-acute D/C as pt with decreased caregiver support. OT will continue to follow acutely.    Follow Up Recommendations  SNF    Equipment Recommendations   (TBD at next venue of care)    Recommendations for Other Services      Precautions / Restrictions Precautions Precautions: Fall Restrictions Weight Bearing Restrictions: No       Mobility Bed Mobility Overal bed mobility: Modified Independent                Transfers Overall transfer level: Needs assistance Equipment used: None Transfers: Sit to/from Stand Sit to Stand: Min guard         General transfer comment: Min guard assist for steadying and safety.    Balance Overall balance assessment: Needs assistance Sitting-balance support: No upper extremity supported;Feet supported Sitting balance-Leahy Scale: Good     Standing balance support: No upper extremity supported;Single extremity supported Standing balance-Leahy Scale: Fair Standing balance comment: Min guard assist for safety with static standing tasks.                           ADL either performed or assessed with clinical judgement   ADL Overall ADL's : Needs assistance/impaired     Grooming:  Wash/dry hands;Cueing for sequencing;Min guard;Standing Grooming Details (indicate cue type and reason): Cues for scanning to the R of the sink and for sequencing. Decreased motor planning imapcting ability to wash hands as pt attempting to rinse prior to turning on the water.                 Toilet Transfer: Minimal assistance;Ambulation Toilet Transfer Details (indicate cue type and reason): Reports feeling more unsteady this session and pt requiring min handheld assist for safety.         Functional mobility during ADLs: Minimal assistance General ADL Comments: Pt able to complete functional reaching tasks to place self-care items from low shelf to high shelf. Pt requiring min guard assist to maintain balance with this task.      Vision   Additional Comments: Pt with decreased ability to identify objects on R side    Perception     Praxis      Cognition Arousal/Alertness: Awake/alert Behavior During Therapy: WFL for tasks assessed/performed;Flat affect Overall Cognitive Status: History of cognitive impairments - at baseline                                 General Comments: Pt states she does not know why she is here. After education provided that she is in the hospital because she had a stroke, she reported that she thinks she is here because she forgot she was supposed to get a medicine refilled.  Pt readily reports that she has dementia and that this is why she is having difficulty.         Exercises     Shoulder Instructions       General Comments Sitter present throughout.    Pertinent Vitals/ Pain       Pain Assessment: No/denies pain  Home Living                                          Prior Functioning/Environment              Frequency  Min 2X/week        Progress Toward Goals  OT Goals(current goals can now be found in the care plan section)  Progress towards OT goals: Progressing toward goals  Acute  Rehab OT Goals Patient Stated Goal: to understand why she is here OT Goal Formulation: With patient Time For Goal Achievement: 06/14/16 Potential to Achieve Goals: Good ADL Goals Pt Will Perform Grooming: with modified independence;standing Pt Will Perform Upper Body Bathing: with modified independence;sitting Pt Will Transfer to Toilet: with modified independence;ambulating;bedside commode Additional ADL Goal #1: Pt will complete a 3 step navigational direction ( was out the door, turn R and stop at room 5C04 is an example)  Plan Discharge plan remains appropriate    Co-evaluation                 AM-PAC PT "6 Clicks" Daily Activity     Outcome Measure   Help from another person eating meals?: A Little Help from another person taking care of personal grooming?: A Little Help from another person toileting, which includes using toliet, bedpan, or urinal?: A Little Help from another person bathing (including washing, rinsing, drying)?: A Little Help from another person to put on and taking off regular upper body clothing?: A Little Help from another person to put on and taking off regular lower body clothing?: A Little 6 Click Score: 18    End of Session Equipment Utilized During Treatment: Gait belt  OT Visit Diagnosis: Unsteadiness on feet (R26.81)   Activity Tolerance Patient tolerated treatment well   Patient Left in chair;with call bell/phone within reach;with chair alarm set   Nurse Communication Mobility status        Time: [id number] OT Time Calculation (min): 18 min  Charges: OT General Charges $OT Visit: 1 Procedure OT Treatments $Therapeutic Activity: 8-22 mins  Shannon Sectionharity A Shannon Merriott, MS OTR/L  Pager: [phone]    [name] 06/03/2016, 2:27 PM
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
## 2016-06-03 NOTE — Progress Notes (Signed)
ANTICOAGULATION CONSULT NOTE - follow up Consult  Pharmacy Consult for warfarin + lovenox bridge Indication: stroke (ischemic)  No Known Allergies  Patient Measurements: Height: 5\' 5"  (165.1 cm) Weight: 199 lb 11.8 oz (90.6 kg) IBW/kg (Calculated) : 57 Heparin Dosing Weight:   Vital Signs: Temp: 98.3 F (36.8 C) (05/08 0021) Temp Source: Oral (05/08 0021) BP: 137/75 (05/08 0555) Pulse Rate: 63 (05/08 0555)  Labs:  Recent Labs  06/01/16 0442 06/02/16 0454 06/03/16 0419  HGB 12.8 12.5 12.3  HCT 40.0 39.1 38.2  PLT 137* 146* 140*  LABPROT 14.8 15.8* 19.0*  INR 1.16 1.25 1.58    Estimated Creatinine Clearance: 67 mL/min (by C-G formula based on SCr of 0.93 mg/dL).   Medical History: Past Medical History:  Diagnosis Date  . COPD (chronic obstructive pulmonary disease) (HCC)    on CT scan 06/28/2015  . Dementia   . Diabetes mellitus   . Hypertension   . Hypothyroidism   . Idiopathic acute pancreatitis 09/16/2011  . 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 Memory loss   . Pancreatitis 2010  . Tobacco abuse   . Trigger finger     Assessment: 65 YOF presents with new ischemic L occipital infarct.  Pt has h/o lacunar stroke and supposed to be on warfarin and ASA but noncompliant with warfarin. PTA dose supposed to warfarin 5 mg PO daily.  INR subtherapeutic on admission therefore pharmacy consulted to dose warfarin + lovenox bridge. Patient is also on ASA 325 daily per neurology.   INR trending up  Goal of Therapy:  INR 2-3  Monitoring platelets while on anticoagulation: Yes  Plan:   Repeat Warfarin 10 mg PO x 1 tonight  Lovenox 1.5 mg/kg q24h  Daily INR, CBC  Provide re-education as needed  Thank you Okey RegalLisa Lateesha Bezold, PharmD [phone] 06/03/2016, 8:49 AM

## 2016-06-03 NOTE — Care Management Important Message (Signed)
Important Message  Patient Details  Name: [name] MRN: [id number] Date of Birth: 04-01-1950   Medicare Important Message Given:  Yes    [name] 06/03/2016, 11:22 AM

## 2016-06-03 NOTE — Clinical Social Work Note (Signed)
CSW updated Dannielle Huh [phone]) and Bertha Stakes [phone]) on bed offers. Both agreed on [facility]. Chris from [facility] met with pt at bedside. Tammy from [facility] agreed to accept pt. Pt to discharge tomorrow once free of sitter for 24 hours. CSW will continue to follow for d/c needs.   [name], [place], [place] Work [phone]

## 2016-06-03 NOTE — Evaluation (Signed)
Speech Language Pathology Evaluation Patient Details Name: Shannon AmenYvonne M Swendsen MRN: [id number] DOB: [birth year] Today's Date: 06/03/2016 Time: [id number] SLP Time Calculation (min) (ACUTE ONLY): 18 min  Problem List:  Patient Active Problem List   Diagnosis Date Noted  . Thrombocytopenia (HCC)   . Sundowning   . Non-compliance   . Chronic obstructive pulmonary disease (HCC)   . Benign essential HTN   . History of CVA (cerebrovascular accident)   . Dementia with behavioral disturbance   . Occipital stroke (HCC)   . 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 Chronic anticoagulation   . Vertigo   . AKI (acute kidney injury) (HCC) 06/28/2015  . Protein-calorie malnutrition, moderate (HCC) 06/28/2015  . Nausea & vomiting 06/28/2015  . Acute CVA (cerebrovascular accident) (HCC) 06/28/2015  . Hypokalemia   . Cerebellar stroke (HCC)   . Emesis   . Anxiety 06/27/2015  . Dizziness 06/27/2015  . Tobacco abuse   . Early onset Alzheimer's dementia without behavioral disturbance 11/03/2014  . Memory deficits 07/03/2014  . Trigger finger, acquired 09/13/2012  . Pain in both wrists 05/12/2012  . Non-proliferative diabetic retinopathy, both eyes (HCC) 05/07/2012  . Colon cancer screening 12/23/2011  . Unintentional weight loss 12/22/2011  . Memory problem 10/31/2011  . Idiopathic acute pancreatitis 10/29/2011  . Major depressive disorder 09/10/2011  . Hypertension 09/10/2011  . Chronic back pain 09/10/2011  . Hyperlipidemia 09/10/2011  . Diabetes mellitus, type 2 (HCC) 09/10/2011   Past Medical History:  Past Medical History:  Diagnosis Date  . COPD (chronic obstructive pulmonary disease) (HCC)    on CT scan 06/28/2015  . Dementia   . Diabetes mellitus   . Hypertension   . Hypothyroidism   . Idiopathic acute pancreatitis 09/16/2011  . 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 Memory loss   . Pancreatitis 2010  . Tobacco abuse   . Trigger finger    Past Surgical History:  Past Surgical  History:  Procedure Laterality Date  . BACK SURGERY    . CHOLECYSTECTOMY  2003  . CYST REMOVAL HAND Left 12/11/2014   Procedure: LEFT RING FINGER TENDON SHEATH CYST EXCISION;  Surgeon: Mack Hookavid Thompson, MD;  Location: [facility];  Service: Orthopedics;  Laterality: Left;  . SPINE SURGERY  2006   Dr. Trey SailorsMark Roy - L3-L4, L4-L5 laminotomy and foraminotomy  . TEE WITHOUT CARDIOVERSION N/A 07/03/2015   Procedure: TRANSESOPHAGEAL ECHOCARDIOGRAM (TEE);  Surgeon: Quintella Reichertraci R Turner, MD;  [facility];  Service: Cardiovascular;  Laterality: N/A;   HPI:  66 yo female admitted with L occipitial infarct  PMH: dementia,.CVA HTN DM   Assessment / Plan / Recommendation Clinical Impression  Pt is oriented x1 and exhibits moderately impaired working memory, sustained attention, mildly complex problem solving, and emergent/anticipatory awareness of deficits. She does report having some cognitive difficulties PTA, and she was living alone after her significant other had a recent surgery. She describes memory issues that impacted her ability to manage medications and finances. Although it is difficult to assess how this compares to her baseline level of function, her current mentation impacts her safety in light of her poor insight into her acute visual and physical impairments. She would benefit from CIR f/u and transition to ALF.    SLP Assessment  SLP Recommendation/Assessment: Patient needs continued Speech Lanaguage Pathology Services SLP Visit Diagnosis: Cognitive communication deficit (R41.841)    Follow Up Recommendations  Inpatient Rehab    Frequency and Duration min 2x/week  2 weeks  SLP Evaluation Cognition  Overall Cognitive Status: Impaired/Different from baseline Arousal/Alertness: Awake/alert Orientation Level: Oriented to person;Disoriented to place;Disoriented to time;Disoriented to situation Attention: Sustained Sustained Attention: Impaired Sustained Attention  Impairment: Verbal basic;Functional basic Memory: Impaired Memory Impairment: Decreased recall of new information Awareness: Impaired Awareness Impairment: Emergent impairment;Anticipatory impairment Problem Solving: Impaired Problem Solving Impairment: Functional basic;Verbal basic Safety/Judgment: Impaired       Comprehension  Auditory Comprehension Overall Auditory Comprehension: Appears within functional limits for tasks assessed    Expression Expression Primary Mode of Expression: Verbal Verbal Expression Overall Verbal Expression: Appears within functional limits for tasks assessed   Oral / Motor  Motor Speech Overall Motor Speech: Appears within functional limits for tasks assessed   GO                    Maxcine Ham 06/03/2016, 4:20 PM   Maxcine Ham, M.A. CCC-SLP [phone]

## 2016-06-04 ENCOUNTER — Inpatient Hospital Stay (HOSPITAL_COMMUNITY): Payer: Medicare Other

## 2016-06-04 DIAGNOSIS — J410 Simple chronic bronchitis: Secondary | ICD-10-CM

## 2016-06-04 DIAGNOSIS — I5030 Unspecified diastolic (congestive) heart failure: Secondary | ICD-10-CM

## 2016-06-04 LAB — COMPREHENSIVE METABOLIC PANEL
ALT: 18 U/L (ref 14–54)
AST: 24 U/L (ref 15–41)
Albumin: 3.2 g/dL — ABNORMAL LOW (ref 3.5–5.0)
Alkaline Phosphatase: 78 U/L (ref 38–126)
Anion gap: 9 (ref 5–15)
BILIRUBIN TOTAL: 0.4 mg/dL (ref 0.3–1.2)
BUN: 20 mg/dL (ref 6–20)
CHLORIDE: 107 mmol/L (ref 101–111)
CO2: 22 mmol/L (ref 22–32)
CREATININE: 1.11 mg/dL — AB (ref 0.44–1.00)
Calcium: 9.2 mg/dL (ref 8.9–10.3)
GFR calc Af Amer: 59 mL/min — ABNORMAL LOW (ref >60)
GFR, EST NON AFRICAN AMERICAN: 51 mL/min — AB (ref >60)
GLUCOSE: 155 mg/dL — AB (ref 65–99)
Potassium: 4 mmol/L (ref 3.5–5.1)
Sodium: 138 mmol/L (ref 135–145)
Total Protein: 6.6 g/dL (ref 6.5–8.1)

## 2016-06-04 LAB — CBC
HEMATOCRIT: 39.8 % (ref 36.0–46.0)
Hemoglobin: 12.8 g/dL (ref 12.0–15.0)
MCH: 27.6 pg (ref 26.0–34.0)
MCHC: 32.2 g/dL (ref 30.0–36.0)
MCV: 85.8 fL (ref 78.0–100.0)
Platelets: 148 10*3/uL — ABNORMAL LOW (ref 150–400)
RBC: 4.64 MIL/uL (ref 3.87–5.11)
RDW: 15.3 % (ref 11.5–15.5)
WBC: 5.6 10*3/uL (ref 4.0–10.5)

## 2016-06-04 LAB — ECHOCARDIOGRAM COMPLETE
HEIGHTINCHES: 65 in
Weight: 3195.79 oz

## 2016-06-04 LAB — GLUCOSE, CAPILLARY
GLUCOSE-CAPILLARY: 155 mg/dL — AB (ref 65–99)
Glucose-Capillary: 112 mg/dL — ABNORMAL HIGH (ref 65–99)

## 2016-06-04 LAB — PROTIME-INR
INR: 1.97
Prothrombin Time: 22.7 seconds — ABNORMAL HIGH (ref 11.4–15.2)

## 2016-06-04 LAB — VITAMIN B1: VITAMIN B1 (THIAMINE): 91.3 nmol/L (ref 66.5–200.0)

## 2016-06-04 NOTE — Clinical Social Work Placement (Signed)
   CLINICAL SOCIAL WORK PLACEMENT  NOTE  Date:  06/04/2016  Patient Details  Name: [name] MRN: [id number] Date of Birth: 07-19-1950  Clinical Social Work is seeking post-discharge placement for this patient at the Skilled  Nursing Facility level of care (*CSW will initial, date and re-position this form in  chart as items are completed):  Yes   Patient/family provided with [facility] Social Work Department's list of facilities offering this level of care within the geographic area requested by the patient (or if unable, by the patient's family).  Yes   Patient/family informed of their freedom to choose among providers that offer the needed level of care, that participate in Medicare, Medicaid or managed care program needed by the patient, have an available bed and are willing to accept the patient.  Yes   Patient/family informed of [facility] ownership interest [facility] and [facility], as well as of the fact that they are under no obligation to receive care at these facilities.  PASRR submitted to EDS on       PASRR number received on       Existing PASRR number confirmed on 06/02/16     FL2 transmitted to all facilities in geographic area requested by pt/family on 06/02/16     FL2 transmitted to all facilities within larger geographic area on       Patient informed that his/her managed care company has contracts with or will negotiate with certain facilities, including the following:        Yes   Patient/family informed of bed offers received.  Patient chooses bed at  [facility]     Physician recommends and patient chooses bed at      Patient to be transferred to  [facility] on 06/04/16.  Patient to be transferred to facility by PTAR     Patient family notified on 06/04/16 of transfer.  Name of family member notified:  [name]/[name]     PHYSICIAN       Additional Comment:  Pt ready for d/c and going to [facility] today.  Pt, [name], and cousin-[name] aware and in agreement with d/c plan. CS[facility] and sent dc summary. Room and report in treatment team sticky note. [facility] to transport. CSW left 2 VMs for [name] re: d/c. CSW signing off as no further CSW needs identified.   _______________________________________________ Dominic PeaJeneya G Kanijah Groseclose, LCSW 06/04/2016, 2:31 PM

## 2016-06-04 NOTE — Progress Notes (Signed)
Patient discharged to skilled nursing facility.  Discharge instructions given and patient to transport via [facility]. Report called to facility. Family friend to bring clothing and belongings to patient new residence. Lawson RadarHeather M Braelon Sprung

## 2016-06-04 NOTE — Progress Notes (Signed)
  Echocardiogram 2D Echocardiogram has been performed.  Clemencia Helzer T Philisha Weinel 06/04/2016, 9:30 AM
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
## 2016-06-04 NOTE — Care Management Note (Signed)
Case Management Note  Patient Details  Name: Shannon AmenYvonne M Fort MRN: [id number] Date of Birth: 06-06-1950  Subjective/Objective:                    Action/Plan: Pt discharging to [facility] today. No further needs per CM.   Expected Discharge Date:  06/04/16               Expected Discharge Plan:  Skilled Nursing Facility  In-House Referral:  Clinical Social Work  Discharge planning Services     Post Acute Care Choice:    Choice offered to:     DME Arranged:    DME Agency:     HH Arranged:    HH Agency:     Status of Service:  Completed, signed off  If discussed at [employer] Meetings, dates discussed:    Additional Comments:  Kermit BaloKelli F Imani Fiebelkorn, RN 06/04/2016, 12:21 PM

## 2016-06-06 ENCOUNTER — Encounter: Payer: Self-pay | Admitting: Adult Health

## 2016-06-06 ENCOUNTER — Non-Acute Institutional Stay (SKILLED_NURSING_FACILITY): Payer: Medicare Other | Admitting: Adult Health

## 2016-06-06 DIAGNOSIS — I639 Cerebral infarction, unspecified: Secondary | ICD-10-CM | POA: Diagnosis not present

## 2016-06-06 DIAGNOSIS — E1169 Type 2 diabetes mellitus with other specified complication: Secondary | ICD-10-CM

## 2016-06-06 DIAGNOSIS — E785 Hyperlipidemia, unspecified: Secondary | ICD-10-CM

## 2016-06-06 DIAGNOSIS — F0280 Dementia in other diseases classified elsewhere without behavioral disturbance: Secondary | ICD-10-CM

## 2016-06-06 DIAGNOSIS — I10 Essential (primary) hypertension: Secondary | ICD-10-CM | POA: Diagnosis not present

## 2016-06-06 DIAGNOSIS — Z7901 Long term (current) use of anticoagulants: Secondary | ICD-10-CM | POA: Diagnosis not present

## 2016-06-06 DIAGNOSIS — E034 Atrophy of thyroid (acquired): Secondary | ICD-10-CM | POA: Diagnosis not present

## 2016-06-06 DIAGNOSIS — F418 Other specified anxiety disorders: Secondary | ICD-10-CM | POA: Insufficient documentation

## 2016-06-06 DIAGNOSIS — G300 Alzheimer's disease with early onset: Secondary | ICD-10-CM

## 2016-06-06 DIAGNOSIS — I358 Other nonrheumatic aortic valve disorders: Secondary | ICD-10-CM | POA: Diagnosis not present

## 2016-06-06 DIAGNOSIS — J410 Simple chronic bronchitis: Secondary | ICD-10-CM

## 2016-06-06 LAB — BASIC METABOLIC PANEL
BUN: 19 mg/dL (ref 4–21)
Creatinine: 1 mg/dL (ref 0.5–1.1)
Glucose: 129 mg/dL
POTASSIUM: 4 mmol/L (ref 3.4–5.3)
SODIUM: 142 mmol/L (ref 137–147)

## 2016-06-06 LAB — HEPATIC FUNCTION PANEL
ALK PHOS: 82 U/L (ref 25–125)
ALT: 17 U/L (ref 7–35)
AST: 24 U/L (ref 13–35)
BILIRUBIN, TOTAL: 0.3 mg/dL

## 2016-06-06 LAB — CBC AND DIFFERENTIAL
HEMATOCRIT: 44 % (ref 36–46)
Hemoglobin: 13.7 g/dL (ref 12.0–16.0)
NEUTROS ABS: 3 /uL
PLATELETS: 160 10*3/uL (ref 150–399)
WBC: 6 10^3/mL

## 2016-06-06 LAB — POCT INR: INR: 1.5 — AB (ref 0.9–1.1)

## 2016-06-06 LAB — PROTIME-INR: Protime: 18.1 seconds — AB (ref 10.0–13.8)
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
## 2016-06-12 ENCOUNTER — Encounter: Payer: Self-pay | Admitting: Internal Medicine

## 2016-06-12 ENCOUNTER — Non-Acute Institutional Stay (SKILLED_NURSING_FACILITY): Payer: Medicare Other | Admitting: Internal Medicine

## 2016-06-12 DIAGNOSIS — J410 Simple chronic bronchitis: Secondary | ICD-10-CM

## 2016-06-12 DIAGNOSIS — E1159 Type 2 diabetes mellitus with other circulatory complications: Secondary | ICD-10-CM | POA: Diagnosis not present

## 2016-06-12 DIAGNOSIS — Z7901 Long term (current) use of anticoagulants: Secondary | ICD-10-CM

## 2016-06-12 DIAGNOSIS — E034 Atrophy of thyroid (acquired): Secondary | ICD-10-CM | POA: Diagnosis not present

## 2016-06-12 DIAGNOSIS — E785 Hyperlipidemia, unspecified: Secondary | ICD-10-CM | POA: Diagnosis not present

## 2016-06-12 DIAGNOSIS — F0280 Dementia in other diseases classified elsewhere without behavioral disturbance: Secondary | ICD-10-CM | POA: Diagnosis not present

## 2016-06-12 DIAGNOSIS — I10 Essential (primary) hypertension: Secondary | ICD-10-CM | POA: Diagnosis not present

## 2016-06-12 DIAGNOSIS — G300 Alzheimer's disease with early onset: Secondary | ICD-10-CM

## 2016-06-12 DIAGNOSIS — Z8673 Personal history of transient ischemic attack (TIA), and cerebral infarction without residual deficits: Secondary | ICD-10-CM

## 2016-06-12 DIAGNOSIS — E1169 Type 2 diabetes mellitus with other specified complication: Secondary | ICD-10-CM | POA: Diagnosis not present

## 2016-06-12 NOTE — Progress Notes (Signed)
Patient ID: [name], female   DOB: Feb 23, 1950, 66 y.o.   MRN: [id number]    HISTORY AND PHYSICAL   DATE: 06/12/2016  Location:    [facility] Room Number: 123 B Place of Service: SNF (31)   Extended Emergency Contact Information Primary Emergency Contact: Cameron Proud [place] Home Phone: [phone] Relation: Friend Secondary Emergency Contact: Inocencio Homes [place] Home Phone: [phone] Mobile Phone: [phone] Relation: Friend  Advanced Directive information Does Patient Have a Medical Advance Directive?: No, Would patient like information on creating a medical advance directive?: No - Patient declined  Chief Complaint  Patient presents with  . 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 CTA neck revealed left P 2/3 segment occlusion correlate with internal left occipital infarct stable, intracranial circulation (moderate left cavernous stenosis, mild atherosclerosis of neck). MRI brain showed old PCA territory infarct with superimposed tiny acute left occipital infarct vs artifact; Tiny subacute right occipital infarct vs artifact; multiple old b/l small cerebellar infarcts; old left basal ganglia lacunar. Albumin 3.2; Cr 1.01-->1.0; plts 132K-->148K; A1c 6.5%; LDL101; TSH 4.816. She presents to SNF for short term rehab.  Today she reports feeling confused at times but admits to hx dementia. she states her gait is unsteady. No nursing issues. No falls. She is a poor historian due to dementia. Hx obtained from chart  Hypertension - stable on lisinopril 20 mg daily; norvasc 10 mg daily   Dyslipidemia - stable on lipitor 40 mg daily . LDL 101  COPD - stable without meds  DM - controlled. A1c 6.5%.  Takes metformin 500  mg daily   Hypothyroidism - TSH 4.816; free T4: 1.13 with probable euthyroid sick syndrome due to recent illness. Takes synthroid 75 mcg daily    Alzheimer's disease - stable on aricept 10 mg daily. She wears a  wondergard  Depression with anxiety - mood stable on zoloft 100 mg twice daily; remeron 15 mg nightly; seroquel 25 mg nightly   Hx CVA - recent b/l occipital. She has right sided hemianopia. 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 Takes coumadin 10 mg daily and is on lovenox 135 mg daily until INR 2-3  Past Medical History:  Diagnosis Date  . COPD (chronic obstructive pulmonary disease) (HCC)    on CT scan 06/28/2015  . Dementia   . Diabetes mellitus   . Hypertension   . Hypothyroidism   . Idiopathic acute pancreatitis 09/16/2011  . 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 Memory loss   . Pancreatitis 2010  . Tobacco abuse   . Trigger finger     Past Surgical History:  Procedure Laterality Date  . BACK SURGERY    . CHOLECYSTECTOMY  2003  . CYST REMOVAL HAND Left 12/11/2014   Procedure: LEFT RING FINGER TENDON SHEATH CYST EXCISION;  Surgeon: Mack Hook, MD;  Location: [facility];  Service: Orthopedics;  Laterality: Left;  . SPINE SURGERY  2006   Dr. Trey Sailors - L3-L4, L4-L5 laminotomy and foraminotomy  . TEE WITHOUT CARDIOVERSION N/A 07/03/2015   Procedure: TRANSESOPHAGEAL ECHOCARDIOGRAM (TEE);  Surgeon: Quintella Reichert, MD;  Location: [facility];  Service: Cardiovascular;  Laterality: N/A;  Patient Care Team: Kirby Funk, MD as PCP - General (Internal Medicine) Jamas Lav, MD as Consulting Physician (Psychiatry) Trey Sailors, MD as Consulting Physician (Neurosurgery)  Social History   Social History  . Marital status: Single    Spouse name: N/A  . Number of children: N/A  . Years of education: N/A   Occupational History  . Not on file.   Social History Main Topics  . Smoking  status: Former Smoker    Packs/day: 0.50    Types: Cigarettes    Start date: 07/01/2015  . Smokeless tobacco: Never Used     Comment: still in stress. looking for job.   . Alcohol use No  . Drug use: No  . Sexual activity: Not on file   Other Topics Concern  . Not on file   Social History Narrative   Lives with boyfriend.  No children.  Retired from [employer].  Education: 2 years of college.     reports that she has quit smoking. Her smoking use included Cigarettes. She started smoking about a year ago. She smoked 0.50 packs per day. She has never used smokeless tobacco. She reports that she does not drink alcohol or use drugs.  Family History  Problem Relation Age of Onset  . Alcohol abuse Father   . Cancer Mother    Family Status  Relation Status  . Father Deceased  . Mother Deceased    Immunization History  Administered Date(s) Administered  . Influenza Split 11/03/2011  . Influenza,inj,Quad PF,36+ Mos 11/17/2012  . Tdap 12/19/2011    No Known Allergies  Medications: Patient's Medications  New Prescriptions   No medications on file  Previous Medications   AMLODIPINE (NORVASC) 10 MG TABLET    Take 10 mg by mouth daily.    ASPIRIN EC 325 MG TABLET    Take 1 tablet (325 mg total) by mouth daily. To be taken along with coumadin until therapeutic; once patient achieve therapeutic INR stop ASA   ATORVASTATIN (LIPITOR) 40 MG TABLET    Take 40 mg by mouth daily.   DONEPEZIL (ARICEPT) 10 MG TABLET    Take 1 tablet (10 mg total) by mouth at bedtime.   LEVOTHYROXINE (SYNTHROID, LEVOTHROID) 75 MCG TABLET    Take 1 tablet (75 mcg total) by mouth daily.   LISINOPRIL (PRINIVIL,ZESTRIL) 20 MG TABLET    TAKE 1 TABLET (20 MG TOTAL) BY MOUTH DAILY.   METFORMIN (GLUCOPHAGE) 500 MG TABLET    TAKE ONE TABLET BY MOUTH ONCE DAILY   MIRTAZAPINE (REMERON) 15 MG TABLET    Take 15 mg by mouth at bedtime.    QUETIAPINE (SEROQUEL) 25 MG TABLET    Take 1 tablet (25 mg total) by mouth at  bedtime.   SERTRALINE (ZOLOFT) 100 MG TABLET    Take 1 tablet (100 mg total) by mouth 2 (two) times daily.   WARFARIN (COUMADIN) 10 MG TABLET    Take 10 mg by mouth every evening.  Modified Medications   No medications on file  Discontinued Medications   ENOXAPARIN (LOVENOX) 150 MG/ML INJECTION    Inject 0.91 mLs (135 mg total) into the skin daily.    Review of Systems  Unable to perform ROS: Dementia    Vitals:   06/12/16 1148  BP: (!) 148/80  Pulse: 70  Resp: 18  Temp: 97.6 F (36.4 C)  TempSrc: Oral  SpO2: 96%  Weight: 219 lb (99.3 kg)  Height: 5\' 7"  (1.702 m)   Body mass  index is 34.3 kg/m.  Physical Exam  Constitutional: She appears well-developed and well-nourished.  Looks well in NAD, sitting up in bed   HENT:  Mouth/Throat: Oropharynx is clear and moist. No oropharyngeal exudate.  Eyes: Pupils are equal, round, and reactive to light. No scleral icterus.  Neck: Neck supple. Carotid bruit is not present. No tracheal deviation present. No thyromegaly present.  Cardiovascular: Normal rate, regular rhythm and intact distal pulses.  Exam reveals no gallop and no friction rub.   Murmur (1/6 SEM) heard. No LE edema b/l. no calf TTP.   Pulmonary/Chest: Effort normal and breath sounds normal. No stridor. No respiratory distress. She has no wheezes. She has no rales.  Abdominal: Soft. Bowel sounds are normal. She exhibits no distension and no mass. There is no hepatomegaly. There is no tenderness. There is no rebound and no guarding.  Lymphadenopathy:    She has no cervical adenopathy.  Neurological: She is alert. She has normal strength.  Skin: Skin is warm and dry. No rash noted.  Psychiatric: She has a normal mood and affect. Her behavior is normal. Her speech is slurred (slight).     Labs reviewed: Abstract on 06/06/2016  Component Date Value Ref Range Status  . INR 06/06/2016 1.5* 0.9 - 1.1 Final  . Hemoglobin 06/06/2016 13.7  12.0 - 16.0 g/dL Final  . HCT  16/10/9602 44  36 - 46 % Final  . Neutrophils Absolute 06/06/2016 3  /L Final  . Platelets 06/06/2016 160  150 - 399 K/L Final  . WBC 06/06/2016 6.0  10^3/mL Final  . Protime 06/06/2016 18.1* 10.0 - 13.8 seconds Final  . Glucose 06/06/2016 129  mg/dL Final  . BUN 54/09/8117 19  4 - 21 mg/dL Final  . Creatinine 14/78/2956 1.0  0.5 - 1.1 mg/dL Final  . Potassium 21/30/8657 4.0  3.4 - 5.3 mmol/L Final  . Sodium 06/06/2016 142  137 - 147 mmol/L Final  . Alkaline Phosphatase 06/06/2016 82  25 - 125 U/L Final  . ALT 06/06/2016 17  7 - 35 U/L Final  . AST 06/06/2016 24  13 - 35 U/L Final  . Bilirubin, Total 06/06/2016 0.3  mg/dL Final  Admission on 84/69/6295, Discharged on 06/04/2016  No results displayed because visit has over 200 results.  CBC Latest Ref Rng & Units 06/06/2016 06/04/2016 06/03/2016  WBC 10:3/mL 6.0 5.6 5.5  Hemoglobin 12.0 - 16.0 g/dL 28.4 13.2 44.0  Hematocrit 36 - 46 % 44 39.8 38.2  Platelets 150 - 399 K/L 160 148(L) 140(L)   CMP Latest Ref Rng & Units 06/06/2016 06/04/2016 05/31/2016  Glucose 65 - 99 mg/dL - 102(V) 97  BUN 4 - 21 mg/dL 19 20 25(D)  Creatinine 0.5 - 1.1 mg/dL 1.0 6.64(Q) 0.34  Sodium 137 - 147 mmol/L 142 138 139  Potassium 3.4 - 5.3 mmol/L 4.0 4.0 4.0  Chloride 101 - 111 mmol/L - 107 105  CO2 22 - 32 mmol/L - 22 22  Calcium 8.9 - 10.3 mg/dL - 9.2 9.3  Total Protein 6.5 - 8.1 g/dL - 6.6 7.1  Total Bilirubin 0.3 - 1.2 mg/dL - 0.4 0.8  Alkaline Phos 25 - 125 U/L 82 78 74  AST 13 - 35 U/L 24 24 30   ALT 7 - 35 U/L 17 18 24    Lipid Panel     Component Value Date/Time   CHOL 160 05/31/2016 0427   TRIG 76 05/31/2016 0427   HDL 44 05/31/2016 0427   CHOLHDL 3.6  05/31/2016 0427   VLDL 15 05/31/2016 0427   LDLCALC 101 (H) 05/31/2016 0427   Lab Results  Component Value Date   HGBA1C 6.5 (H) 05/31/2016       Ct Angio Head W Or Wo Contrast  Result Date: 05/31/2016 CLINICAL DATA:  Stroke workup. EXAM: CT ANGIOGRAPHY HEAD AND NECK TECHNIQUE:  Multidetector CT imaging of the head and neck was performed using the standard protocol during bolus administration of intravenous contrast. Multiplanar CT image reconstructions and MIPs were obtained to evaluate the vascular anatomy. Carotid stenosis measurements (when applicable) are obtained utilizing NASCET criteria, using the distal internal carotid diameter as the denominator. CONTRAST:  50 cc Isovue 370 intravenous COMPARISON:  Brain MRI 06/28/2015.  CTA head neck 06/28/2015 FINDINGS: CT HEAD FINDINGS Brain: Remote left PCA territory infarct, new from June 2017. No visible acute infarct, hemorrhage, hydrocephalus, or mass. Remote small vessel infarcts in the right cerebellum. Vascular: See below Skull: No acute or aggressive finding Sinuses: Negative Orbits: Negative Review of the MIP images confirms the above findings CTA NECK FINDINGS Aortic arch: Atherosclerosis of the arch. Two vessel branching pattern Right carotid system: Moderate plaque of the proximal brachiocephalic artery. Mild calcified plaque on the carotid bulb. No focal stenosis or ulceration. Tortuosity with two kinks in the mid to distal ICA Left carotid system: No noted atheromatous changes. No stenosis or ulceration. Tortuosity with retropharyngeal course. Vertebral arteries: No proximal subclavian stenosis. Codominant vertebral arteries. The vertebral arteries are smooth and widely patent. Skeleton: Diffuse facet arthropathy and moderate disc degeneration. No acute or aggressive finding. Other neck: No incidental mass or inflammation. Upper chest: Centrilobular emphysema Review of the MIP images confirms the above findings CTA HEAD FINDINGS Anterior circulation: Atherosclerosis on the carotid siphons. Moderate left mid cavernous segment stenosis, stable. Hypoplastic right A1 segment. No major branch occlusion or reversible proximal flow limiting stenosis. Negative for aneurysm. Posterior circulation: Smooth and widely patent vertebral and  basilar arteries. No unusual branching. Left P2/P3 segment occlusion correlating with the infarct, with faint downstream flow. Right superior cerebellar artery flow is improved. Negative for aneurysm. Venous sinuses: Patent as permitted by contrast timing Anatomic variants: None significant Delayed phase: No abnormal intracranial enhancement Review of the MIP images confirms the above findings IMPRESSION: 1. Left P 2/3 segment occlusion correlating with the interval left occipital infarct. 2. Stable anterior intracranial circulation including moderate left cavernous stenosis. 3. Mild atherosclerosis in the neck without stenosis. Bilateral ICA tortuosity. 4. Aortic Atherosclerosis (ICD10-I70.0) and Emphysema (ICD10-J43.9). Electronically Signed   By: Marnee Spring M.D.   On: 05/31/2016 20:56   Dg Chest 2 View  Result Date: 05/31/2016 CLINICAL DATA:  Status post CVA.  Initial encounter. EXAM: CHEST  2 VIEW COMPARISON:  Chest radiograph performed 10/17/2010 FINDINGS: The lungs are well-aerated. Peribronchial thickening is noted. There is no evidence of focal opacification, pleural effusion or pneumothorax. The heart is normal in size; the mediastinal contour is within normal limits. No acute osseous abnormalities are seen. Clips are noted within the right upper quadrant, reflecting prior cholecystectomy. IMPRESSION: Peribronchial thickening noted.  Lungs otherwise grossly clear. Electronically Signed   By: Roanna Raider M.D.   On: 05/31/2016 19:08   Ct Head Wo Contrast  Result Date: 05/30/2016 CLINICAL DATA:  Elevated blood pressure. Dizziness and pressure over the right eye. Impaired vision over the right eye. EXAM: CT HEAD WITHOUT CONTRAST TECHNIQUE: Contiguous axial images were obtained from the base of the skull through the vertex without intravenous contrast. COMPARISON:  CT scan Jun 27, 2015 and MRI June 28, 2015 FINDINGS: Brain: No subdural, epidural, or subarachnoid hemorrhage. A few small  low-attenuation lesions in the cerebellum correlate with the history of previous cerebellar infarcts seen on previous MRI. The brainstem is normal. The basal cisterns are patent. A left occipital infarct correlates with the patient's history. A few foci of higher attenuation within this infarct likely represent calcification or a tiny amount of residual cortex. The infarct is very low in attenuation without significant mass effect and is thought to be nonacute. The left posterolateral ventricles is also a little larger than the right suggesting some mild expected dilatation, also suggesting this infarct is not likely acute. However, the infarct was clearly not present in 2017. The ventricles and sulci are otherwise unchanged. No midline shift. No other sites of infarct or ischemia are identified. Vascular: Calcified atherosclerosis seen in the intracranial carotid arteries. Skull: Normal. Negative for fracture or focal lesion. Sinuses/Orbits: No acute finding. Other: None. IMPRESSION: 1. Left occipital infarct explaining the patient's right visual symptoms. Based on the low-attenuation an lack of mass effect, the infarct is not thought to be acute. Recommend clinical correlation with timing of patient's symptom onset. 2. No other acute abnormalities identified. Electronically Signed   By: Gerome Sam III M.D   On: 05/30/2016 17:59   Ct Angio Neck W Or Wo Contrast  Result Date: 05/31/2016 CLINICAL DATA:  Stroke workup. EXAM: CT ANGIOGRAPHY HEAD AND NECK TECHNIQUE: Multidetector CT imaging of the head and neck was performed using the standard protocol during bolus administration of intravenous contrast. Multiplanar CT image reconstructions and MIPs were obtained to evaluate the vascular anatomy. Carotid stenosis measurements (when applicable) are obtained utilizing NASCET criteria, using the distal internal carotid diameter as the denominator. CONTRAST:  50 cc Isovue 370 intravenous COMPARISON:  Brain MRI  06/28/2015.  CTA head neck 06/28/2015 FINDINGS: CT HEAD FINDINGS Brain: Remote left PCA territory infarct, new from June 2017. No visible acute infarct, hemorrhage, hydrocephalus, or mass. Remote small vessel infarcts in the right cerebellum. Vascular: See below Skull: No acute or aggressive finding Sinuses: Negative Orbits: Negative Review of the MIP images confirms the above findings CTA NECK FINDINGS Aortic arch: Atherosclerosis of the arch. Two vessel branching pattern Right carotid system: Moderate plaque of the proximal brachiocephalic artery. Mild calcified plaque on the carotid bulb. No focal stenosis or ulceration. Tortuosity with two kinks in the mid to distal ICA Left carotid system: No noted atheromatous changes. No stenosis or ulceration. Tortuosity with retropharyngeal course. Vertebral arteries: No proximal subclavian stenosis. Codominant vertebral arteries. The vertebral arteries are smooth and widely patent. Skeleton: Diffuse facet arthropathy and moderate disc degeneration. No acute or aggressive finding. Other neck: No incidental mass or inflammation. Upper chest: Centrilobular emphysema Review of the MIP images confirms the above findings CTA HEAD FINDINGS Anterior circulation: Atherosclerosis on the carotid siphons. Moderate left mid cavernous segment stenosis, stable. Hypoplastic right A1 segment. No major branch occlusion or reversible proximal flow limiting stenosis. Negative for aneurysm. Posterior circulation: Smooth and widely patent vertebral and basilar arteries. No unusual branching. Left P2/P3 segment occlusion correlating with the infarct, with faint downstream flow. Right superior cerebellar artery flow is improved. Negative for aneurysm. Venous sinuses: Patent as permitted by contrast timing Anatomic variants: None significant Delayed phase: No abnormal intracranial enhancement Review of the MIP images confirms the above findings IMPRESSION: 1. Left P 2/3 segment occlusion  correlating with the interval left occipital infarct. 2. Stable anterior intracranial circulation  including moderate left cavernous stenosis. 3. Mild atherosclerosis in the neck without stenosis. Bilateral ICA tortuosity. 4. Aortic Atherosclerosis (ICD10-I70.0) and Emphysema (ICD10-J43.9). Electronically Signed   By: Marnee SpringJonathon  Watts M.D.   On: 05/31/2016 20:56   Mr Brain Wo Contrast  Result Date: 05/31/2016 CLINICAL DATA:  Follow-up LEFT occipital lobe infarct. RIGHT visual field deficits, paranoia. History of dementia, hypertension, diabetes. EXAM: MRI HEAD WITHOUT CONTRAST TECHNIQUE: Multiplanar, multiecho pulse sequences of the brain and surrounding structures were obtained without intravenous contrast. COMPARISON:  CT angiogram of the head May 31, 2016 at 1825 hours and MRI of the head June 28, 2015 FINDINGS: BRAIN: Faint linear reduced diffusion RIGHT occipital lobe, normalized ADC values. Additional linear reduced diffusion LEFT inferior occipital lobe along the margin of prior infarct with low ADC values. Hemosiderin staining LEFT occipital lobe. A few punctate scattered micro hemorrhages present. LEFT mesial occipital lobe encephalomalacia with ex vacuo dilatation LEFT occipital horn, and mild ventriculomegaly on the basis of global parenchymal brain volume loss. Focal LEFT splenium of the corpus callosum gliosis. Old LEFT basal ganglia lacunar infarct. Multiple old small bilateral cerebellar infarcts though some are new from prior MRI. Additional scattered subcentimeter supratentorial white matter FLAIR T2 hyperintensities. No midline shift, mass effect or masses. No abnormal extra-axial fluid collections. VASCULAR: Normal major intracranial vascular flow voids present at skull base. SKULL AND UPPER CERVICAL SPINE: Expanded partially empty sella. No suspicious calvarial bone marrow signal. Craniocervical junction maintained. SINUSES/ORBITS: Trace paranasal sinus mucosal thickening. Mastoid air cells are  well aerated. The included ocular globes and orbital contents are non-suspicious. OTHER: None. IMPRESSION: Mild motion degraded examination. Old PCA territory infarcts with superimposed tiny acute LEFT occipital infarct versus artifact. Tiny subacute RIGHT occipital lobe infarct versus motion artifact. Multiple old bilateral small cerebellar infarcts though, though some are new from June 28, 2015. Mild chronic small vessel ischemic disease. Old LEFT basal ganglia lacunar infarct. Electronically Signed   By: Awilda Metroourtnay  Bloomer M.D.   On: 05/31/2016 23:57     Assessment/Plan   ICD-9-CM ICD-10-CM   1. History of recent stroke V12.54 Z86.73    occipital infarct; hx b/l cerebellar/left basal ganglia infarcts  2. Early onset Alzheimer's dementia without behavioral disturbance 331.0 G30.0    294.10 F02.80   3. Hypothyroidism due to acquired atrophy of thyroid 244.8 E03.4    246.8     with probable euthyroid sick syndrome due to recent hospitalization  4. Benign essential HTN 401.1 I10   5. Simple chronic bronchitis (HCC) 491.0 J41.0   6. Dyslipidemia associated with type 2 diabetes mellitus (HCC) 250.80 E11.69    272.4 E78.5   7. Type 2 diabetes mellitus with other circulatory complication, without long-term current use of insulin (HCC) 250.70 E11.59   8. Chronic anticoagulation V58.61 Z79.01    Cont current meds as ordered  Follow INR. GOAL 2-3.  F/u with neurology/stroke clinic as scheduled  Check bmp next week  Repeat TSH in 6 weeks  PT/Ot/ST as ordered  GOAL: short term rehab and d/c home when medically appropriate. Communicated with pt and nursing.  Will follow  Rosa Gambale S. Ancil Linseyarter, D. O., F. A. C. O. I.  [facility] [address] [place], [place] [address] [phone] Cell (Monday-Friday 8 AM - 5 PM) [phone] After 5 PM and follow prompts

## 2016-06-13 LAB — PROTIME-INR: Protime: 35.9 seconds — AB (ref 10.0–13.8)

## 2016-06-13 LAB — POCT INR: INR: 3.6 — AB (ref 0.9–1.1)

## 2016-06-19 ENCOUNTER — Ambulatory Visit: Payer: Medicare Other | Admitting: Neurology

## 2016-06-19 LAB — BASIC METABOLIC PANEL
BUN: 16 mg/dL (ref 4–21)
CREATININE: 1.1 mg/dL (ref 0.5–1.1)
Glucose: 135 mg/dL
Potassium: 4.5 mmol/L (ref 3.4–5.3)
Sodium: 140 mmol/L (ref 137–147)

## 2016-06-19 LAB — TSH: TSH: 0.18 — AB (ref 0.41–5.90)

## 2016-06-20 LAB — POCT INR: INR: 4.3 — AB (ref 0.9–1.1)

## 2016-06-20 LAB — PROTIME-INR: Protime: 41.2 seconds — AB (ref 10.0–13.8)

## 2016-06-24 LAB — POCT INR: INR: 1.1 (ref 0.9–1.1)

## 2016-06-24 LAB — PROTIME-INR: Protime: 14.4 seconds — AB (ref 10.0–13.8)

## 2016-06-25 LAB — POCT INR: INR: 1.3 — AB (ref 0.9–1.1)

## 2016-06-25 LAB — PROTIME-INR: Protime: 16.4 — AB (ref 10.0–13.8)

## 2016-06-27 ENCOUNTER — Non-Acute Institutional Stay (SKILLED_NURSING_FACILITY): Payer: Medicare Other | Admitting: Adult Health

## 2016-06-27 ENCOUNTER — Encounter: Payer: Self-pay | Admitting: Adult Health

## 2016-06-27 DIAGNOSIS — E034 Atrophy of thyroid (acquired): Secondary | ICD-10-CM | POA: Diagnosis not present

## 2016-06-27 LAB — PROTIME-INR: PROTIME: 27 — AB (ref 10.0–13.8)

## 2016-06-27 LAB — POCT INR: INR: 2.5 — AB (ref 0.9–1.1)

## 2016-06-27 NOTE — Progress Notes (Signed)
Location:    Nursing Home Room Number: 124A Place of Service:  [facility]   CODE STATUS: Full Code/MOST  No Known Allergies  Chief Complaint  Patient presents with  . Acute Visit    abnormal labs    HPI:  Her tsh is low at 0.18; her current synthroid dose is 75 mcg daily. She will need to have her dose adjusted to better manage her hypothyroidism.   Past Medical History:  Diagnosis Date  . COPD (chronic obstructive pulmonary disease) (HCC)    on CT scan 06/28/2015  . Dementia   . Diabetes mellitus   . Hypertension   . Hypothyroidism   . Idiopathic acute pancreatitis 09/16/2011  . 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 Memory loss   . Pancreatitis 2010  . Tobacco abuse   . Trigger finger     Past Surgical History:  Procedure Laterality Date  . BACK SURGERY    . CHOLECYSTECTOMY  2003  . CYST REMOVAL HAND Left 12/11/2014   Procedure: LEFT RING FINGER TENDON SHEATH CYST EXCISION;  Surgeon: Mack Hook, MD;  Location: [facility];  Service: Orthopedics;  Laterality: Left;  . SPINE SURGERY  2006   Dr. Trey Sailors - L3-L4, L4-L5 laminotomy and foraminotomy  . TEE WITHOUT CARDIOVERSION N/A 07/03/2015   Procedure: TRANSESOPHAGEAL ECHOCARDIOGRAM (TEE);  Surgeon: Quintella Reichert, MD;  Location: [facility] ENDOSCOPY;  Service: Cardiovascular;  Laterality: N/A;    Social History   Social History  . Marital status: Single    Spouse name: N/A  . Number of children: N/A  . Years of education: N/A   Occupational History  . Not on file.   Social History Main Topics  . Smoking status: Former Smoker    Packs/day: 0.50    Types: Cigarettes    Start date: 07/01/2015  . Smokeless tobacco: Never Used     Comment: still in stress. looking for job.   . Alcohol use No  . Drug use: No  . Sexual activity: Not on file   Other Topics Concern  . Not on file   Social History Narrative   Lives with boyfriend.  No children.  Retired from [employer].  Education: 2 years of  college.   Family History  Problem Relation Age of Onset  . Alcohol abuse Father   . Cancer Mother       VITAL SIGNS BP 128/66   Pulse 66   Temp 98.4 F (36.9 C)   Ht 5\' 7"  (1.702 m)   Wt 219 lb (99.3 kg)   SpO2 96%   BMI 34.30 kg/m   Patient's Medications  New Prescriptions   No medications on file  Previous Medications   AMLODIPINE (NORVASC) 10 MG TABLET    Take 10 mg by mouth daily.    ASPIRIN EC 325 MG TABLET    Take 1 tablet (325 mg total) by mouth daily. To be taken along with coumadin until therapeutic; once patient achieve therapeutic INR stop ASA   ATORVASTATIN (LIPITOR) 40 MG TABLET    Take 40 mg by mouth daily.   DONEPEZIL (ARICEPT) 10 MG TABLET    Take 1 tablet (10 mg total) by mouth at bedtime.   ENOXAPARIN (LOVENOX) 100 MG/ML INJECTION    Inject 100 mg into the skin daily.   LEVOTHYROXINE (SYNTHROID, LEVOTHROID) 75 MCG TABLET    Take 1 tablet (75 mcg total) by mouth daily.   LISINOPRIL (PRINIVIL,ZESTRIL) 20 MG TABLET    TAKE  1 TABLET (20 MG TOTAL) BY MOUTH DAILY.   METFORMIN (GLUCOPHAGE) 500 MG TABLET    TAKE ONE TABLET BY MOUTH ONCE DAILY   MIRTAZAPINE (REMERON) 15 MG TABLET    Take 15 mg by mouth at bedtime.    QUETIAPINE (SEROQUEL) 25 MG TABLET    Take 1 tablet (25 mg total) by mouth at bedtime.   SERTRALINE (ZOLOFT) 100 MG TABLET    Take 1 tablet (100 mg total) by mouth 2 (two) times daily.   WARFARIN (COUMADIN) 10 MG TABLET    Take 11 mg by mouth every evening.   Modified Medications   No medications on file  Discontinued Medications   No medications on file     SIGNIFICANT DIAGNOSTIC EXAMS   05-30-16: ct of head: 1. Left occipital infarct explaining the patient's right visual symptoms. Based on the low-attenuation an lack of mass effect, the infarct is not thought to be acute. Recommend clinical correlation with timing of patient's symptom onset. 2. No other acute abnormalities identified.  05-31-16: chest x-ray: Peribronchial thickening noted. Lungs  otherwise grossly clear.  05-31-16: mri of brain: Mild motion degraded examination. Old PCA territory infarcts with superimposed tiny acute LEFT occipital infarct versus artifact. Tiny subacute RIGHT occipital lobe infarct versus motion artifact. Multiple old bilateral small cerebellar infarcts though, though some are new from June 28, 2015. Mild chronic small vessel ischemic disease. Old LEFT basal ganglia lacunar infarct.  05-31-16: ct angio of head and neck: 1. Left P 2/3 segment occlusion correlating with the interval left occipital infarct. 2. Stable anterior intracranial circulation including moderate left cavernous stenosis. 3. Mild atherosclerosis in the neck without stenosis. Bilateral ICA tortuosity. 4. Aortic Atherosclerosis (ICD10-I70.0) and Emphysema (ICD10-J43.9).   06-04-16: 2-d echo:   - Left ventricle: The cavity size was normal. Wall thickness was increased in a pattern of mild LVH. Systolic function was normal. The estimated ejection fraction was in the range of 60% to 65%. Wall motion was normal; there were no regional wall motion abnormalities. Features are consistent with a pseudonormal left ventricular filling pattern, with concomitant abnormal relaxation and increased filling pressure (grade 2 diastolic dysfunction). - Right ventricle: Systolic function was mildly reduced.   LABS REVIEWED:   05-30-16; wbc 6.1; hgb 13.6; hct 43.6; mcv 89.2 plt 132; glucose 100; bun 26; creat 1.01; k+ 3.7; na++ 141; liver normal albumin 3.9; hgb a1 6.5; chol 160; ldl 101; trig 76; hdl 44 05-31-16: tsh 4.816 06-01-16: free T4: 1.13;  RPR: nr; HIV; nr; Vit B12: 567; Vit B1: 91.3 06-04-16: wbc 5.6; hgb 12.8; hct 39.8; mcv 85.8; plt 148; glucose 155; bun 20; creat 1.11; k+ 4.0; na++ 138; liver normal albumin 3.2; INR 1.97  06-06-16: wbc 6.0; hgb 13.7; hct 43.6; mcv 88.4; plt 160; glucose 129; bun 19.2; creat 0.99; k+ 4.0; na++ 142; liver normal albumin 4.1  06-19-16: glucose 135; bun 16.1; creat 1.09; k+  4.5; na++ 140 ca 9.5; tsh 0.18   Review of Systems  Constitutional: Negative for malaise/fatigue.  Respiratory: Negative for cough and shortness of breath.   Cardiovascular: Negative for chest pain, palpitations and leg swelling.  Gastrointestinal: Negative for abdominal pain, constipation and heartburn.  Musculoskeletal: Positive for back pain. Negative for joint pain and myalgias.       Has chronic back pain   Skin: Negative.   Neurological: Negative for dizziness.  Psychiatric/Behavioral: The patient is not nervous/anxious.     Physical Exam  Constitutional: No distress.  Overweight   Eyes:  Conjunctivae are normal.  Right hemianopia   Neck: Neck supple. No JVD present. No thyromegaly present.  Cardiovascular: Normal rate, regular rhythm and intact distal pulses.   Respiratory: Effort normal and breath sounds normal. No respiratory distress. She has no wheezes.  GI: Soft. Bowel sounds are normal. She exhibits no distension. There is no tenderness.  Musculoskeletal: She exhibits no edema.  Able to move all extremities  Is ambulatory   Lymphadenopathy:    She has no cervical adenopathy.  Neurological: She is alert.  Skin: Skin is warm and dry. She is not diaphoretic.  Psychiatric: She has a normal mood and affect.     ASSESSMENT/ PLAN:  1. Hypothyroidism: tsh 0.18; will change her synthroid to 50 mcg daily and will check thyroid labs in 6 weeks    MD is aware of resident's narcotic use and is in agreement with current plan of care. We will attempt to wean resident as apropriate   Synthia Innocent NP [facility]  Contact [phone] Monday through Friday 8am- 5pm  After hours call [phone]

## 2016-07-01 LAB — POCT INR: INR: 3.9 — AB (ref 0.9–1.1)

## 2016-07-01 LAB — PROTIME-INR: Protime: 38.5 — AB (ref 10.0–13.8)

## 2016-07-03 ENCOUNTER — Encounter: Payer: Self-pay | Admitting: Internal Medicine

## 2016-07-03 ENCOUNTER — Non-Acute Institutional Stay (SKILLED_NURSING_FACILITY): Payer: Medicare Other | Admitting: Internal Medicine

## 2016-07-03 DIAGNOSIS — G300 Alzheimer's disease with early onset: Secondary | ICD-10-CM

## 2016-07-03 DIAGNOSIS — E034 Atrophy of thyroid (acquired): Secondary | ICD-10-CM | POA: Diagnosis not present

## 2016-07-03 DIAGNOSIS — F418 Other specified anxiety disorders: Secondary | ICD-10-CM | POA: Diagnosis not present

## 2016-07-03 DIAGNOSIS — E1159 Type 2 diabetes mellitus with other circulatory complications: Secondary | ICD-10-CM | POA: Diagnosis not present

## 2016-07-03 DIAGNOSIS — J410 Simple chronic bronchitis: Secondary | ICD-10-CM | POA: Diagnosis not present

## 2016-07-03 DIAGNOSIS — E785 Hyperlipidemia, unspecified: Secondary | ICD-10-CM

## 2016-07-03 DIAGNOSIS — I10 Essential (primary) hypertension: Secondary | ICD-10-CM | POA: Diagnosis not present

## 2016-07-03 DIAGNOSIS — Z7901 Long term (current) use of anticoagulants: Secondary | ICD-10-CM

## 2016-07-03 DIAGNOSIS — F0280 Dementia in other diseases classified elsewhere without behavioral disturbance: Secondary | ICD-10-CM

## 2016-07-03 DIAGNOSIS — E1169 Type 2 diabetes mellitus with other specified complication: Secondary | ICD-10-CM

## 2016-07-03 DIAGNOSIS — Z8673 Personal history of transient ischemic attack (TIA), and cerebral infarction without residual deficits: Secondary | ICD-10-CM | POA: Diagnosis not present

## 2016-07-03 NOTE — Progress Notes (Signed)
Patient ID: [name], female   DOB: 1950/06/25, 66 y.o.   MRN: [id number]    DATE:  07/03/2016  Location:    [facility] Room Number: 124 A Place of Service: SNF (31)   Extended Emergency Contact Information Primary Emergency Contact: Cameron Proud [place] Home Phone: [phone] Relation: Friend Secondary Emergency Contact: Inocencio Homes [place] Home Phone: [phone] Mobile Phone: [phone] Relation: Friend  Advanced Directive information Does Patient Have a Medical Advance Directive?: Yes, Would patient like information on creating a medical advance directive?: No - Patient declined, Type of Advance Directive: Out of facility DNR (pink MOST or yellow form), Pre-existing out of facility DNR order (yellow form or pink MOST form): Pink MOST form placed in chart (order not valid for inpatient use)  Chief Complaint  Patient presents with  . Medical Management of Chronic Issues    Routine Visit    HPI:  66 yo female long term resident seen today for f/u. She still reports short term memory issues. No other concerns. No nursing issues. No falls. She is a poor historian due to dementia. Hx obtained from chart.  Hypertension - stable on lisinopril 20 mg daily; norvasc 10 mg daily   Dyslipidemia - stable on lipitor 40 mg daily . LDL 101  COPD - stable without meds  DM - controlled. A1c 6.5%.  Takes metformin 500 mg daily   Hypothyroidism - TSH 0.18; free T4: 1.13. Thyroid dose reduced to synthroid 50 mcg daily last week  Alzheimer's disease - stable on aricept 10 mg daily. She wears a  wondergard  Depression with anxiety - mood stable on zoloft 100 mg twice daily; remeron 15 mg nightly; seroquel 25 mg nightly   Hx CVA - recent b/l occipital. She has right sided hemianopia. 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 Takes coumadin 11 mg daily and is on ASA 325mg   daily until INR 2-3.  Past Medical History:  Diagnosis Date  . COPD (chronic obstructive pulmonary disease) (HCC)    on CT scan 06/28/2015  . Dementia   . Diabetes mellitus   . Hypertension   . Hypothyroidism   . Idiopathic acute pancreatitis 09/16/2011  . 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 Memory loss   . Pancreatitis 2010  . Tobacco abuse   . Trigger finger     Past Surgical History:  Procedure Laterality Date  . BACK SURGERY    . CHOLECYSTECTOMY  2003  . CYST REMOVAL HAND Left 12/11/2014   Procedure: LEFT RING FINGER TENDON SHEATH CYST EXCISION;  Surgeon: Mack Hook, MD;  Location: [facility];  Service: Orthopedics;  Laterality: Left;  . SPINE SURGERY  2006   Dr. Trey Sailors - L3-L4, L4-L5 laminotomy and foraminotomy  . TEE WITHOUT CARDIOVERSION N/A 07/03/2015   Procedure: TRANSESOPHAGEAL ECHOCARDIOGRAM (TEE);  Surgeon: Quintella Reichert, MD;  Location: [facility] ENDOSCOPY;  Service: Cardiovascular;  Laterality: N/A;    Patient Care Team: Kirby Funk, MD as PCP - General (Internal Medicine) Jamas Lav, MD as Consulting Physician (Psychiatry) Trey Sailors, MD as Consulting Physician (Neurosurgery)  Social History   Social History  . Marital status: Single    Spouse name: N/A  . Number of children: N/A  . Years of education: N/A   Occupational History  . Not on file.   Social History Main Topics  . Smoking status: Former Smoker  Packs/day: 0.50    Types: Cigarettes    Start date: 07/01/2015  . Smokeless tobacco: Never Used     Comment: still in stress. looking for job.   . Alcohol use No  . Drug use: No  . Sexual activity: Not on file   Other Topics Concern  . Not on file   Social History Narrative   Lives with boyfriend.  No children.  Retired from [employer].  Education: 2 years of college.     reports that she has quit smoking. Her smoking use included Cigarettes. She started smoking about a year ago. She smoked 0.50 packs per day.  She has never used smokeless tobacco. She reports that she does not drink alcohol or use drugs.  Family History  Problem Relation Age of Onset  . Alcohol abuse Father   . Cancer Mother    Family Status  Relation Status  . Father Deceased  . Mother Deceased    Immunization History  Administered Date(s) Administered  . Influenza Split 11/03/2011  . Influenza,inj,Quad PF,36+ Mos 11/17/2012  . Tdap 12/19/2011    No Known Allergies  Medications: Patient's Medications  New Prescriptions   No medications on file  Previous Medications   AMLODIPINE (NORVASC) 10 MG TABLET    Take 10 mg by mouth daily.    ASPIRIN EC 325 MG TABLET    Take 1 tablet (325 mg total) by mouth daily. To be taken along with coumadin until therapeutic; once patient achieve therapeutic INR stop ASA   ATORVASTATIN (LIPITOR) 40 MG TABLET    Take 40 mg by mouth daily.   DONEPEZIL (ARICEPT) 10 MG TABLET    Take 1 tablet (10 mg total) by mouth at bedtime.   LEVOTHYROXINE (SYNTHROID, LEVOTHROID) 50 MCG TABLET    Take 50 mcg by mouth daily before breakfast.   LISINOPRIL (PRINIVIL,ZESTRIL) 20 MG TABLET    TAKE 1 TABLET (20 MG TOTAL) BY MOUTH DAILY.   METFORMIN (GLUCOPHAGE) 500 MG TABLET    TAKE ONE TABLET BY MOUTH ONCE DAILY   MIRTAZAPINE (REMERON) 15 MG TABLET    Take 15 mg by mouth at bedtime.    SERTRALINE (ZOLOFT) 100 MG TABLET    Take 1 tablet (100 mg total) by mouth 2 (two) times daily.   WARFARIN (COUMADIN) 10 MG TABLET    Take 11 mg by mouth every evening.   Modified Medications   No medications on file  Discontinued Medications   ENOXAPARIN (LOVENOX) 100 MG/ML INJECTION    Inject 100 mg into the skin daily.   LEVOTHYROXINE (SYNTHROID, LEVOTHROID) 75 MCG TABLET    Take 1 tablet (75 mcg total) by mouth daily.   QUETIAPINE (SEROQUEL) 25 MG TABLET    Take 1 tablet (25 mg total) by mouth at bedtime.    Review of Systems  Unable to perform ROS: Dementia    Vitals:   07/03/16 1236  BP: 128/66  Pulse: 66    Resp: 18  Temp: 98.4 F (36.9 C)  TempSrc: Oral  SpO2: 96%  Weight: 219 lb (99.3 kg)  Height: 5\' 7"  (1.702 m)   Body mass index is 34.3 kg/m.  Physical Exam  Constitutional: She appears well-developed and well-nourished.  Looks well in NAD, sitting up in bed   HENT:  Mouth/Throat: Oropharynx is clear and moist. No oropharyngeal exudate.  Eyes: Pupils are equal, round, and reactive to light. No scleral icterus.  Neck: Neck supple. Carotid bruit is not present. No tracheal deviation present. No  thyromegaly present.  Cardiovascular: Normal rate, regular rhythm and intact distal pulses.  Exam reveals no gallop and no friction rub.   Murmur (1/6 SEM) heard. Trace LE edema b/l. no calf TTP.   Pulmonary/Chest: Effort normal and breath sounds normal. No stridor. No respiratory distress. She has no wheezes. She has no rales.  Abdominal: Soft. Bowel sounds are normal. She exhibits no distension and no mass. There is no hepatomegaly. There is no tenderness. There is no rebound and no guarding.  Lymphadenopathy:    She has no cervical adenopathy.  Neurological: She is alert. She has normal strength.  Skin: Skin is warm and dry. No rash noted.  Psychiatric: She has a normal mood and affect. Her behavior is normal. Her speech is slurred (slight).     Labs reviewed: Nursing Home on 07/03/2016  Component Date Value Ref Range Status  . INR 06/25/2016 1.3* 0.9 - 1.1 Final  . Protime 06/25/2016 16.4* 10.0 - 13.8 Final  . TSH 06/19/2016 0.18* 0.41 - 5.90 Final  . INR 06/27/2016 2.5* 0.9 - 1.1 Final  . Protime 06/27/2016 27.0* 10.0 - 13.8 Final  Nursing Home on 06/27/2016  Component Date Value Ref Range Status  . Glucose 06/19/2016 135  mg/dL Final  . BUN 16/10/9602 16  4 - 21 mg/dL Final  . Creatinine 54/09/8117 1.1  0.5 - 1.1 mg/dL Final  . Potassium 14/78/2956 4.5  3.4 - 5.3 mmol/L Final  . Sodium 06/19/2016 140  137 - 147 mmol/L Final  . INR 06/24/2016 1.1  0.9 - 1.1 Final  . Protime  06/24/2016 14.4* 10.0 - 13.8 seconds Final  . INR 06/20/2016 4.3* 0.9 - 1.1 Final  . Protime 06/20/2016 41.2* 10.0 - 13.8 seconds Final  . INR 06/13/2016 3.6* 0.9 - 1.1 Final  . Protime 06/13/2016 35.9* 10.0 - 13.8 seconds Final  Abstract on 06/06/2016  Component Date Value Ref Range Status  . INR 06/06/2016 1.5* 0.9 - 1.1 Final  . Hemoglobin 06/06/2016 13.7  12.0 - 16.0 g/dL Final  . HCT 21/30/8657 44  36 - 46 % Final  . Neutrophils Absolute 06/06/2016 3  /L Final  . Platelets 06/06/2016 160  150 - 399 K/L Final  . WBC 06/06/2016 6.0  10^3/mL Final  . Protime 06/06/2016 18.1* 10.0 - 13.8 seconds Final  . Glucose 06/06/2016 129  mg/dL Final  . BUN 84/69/6295 19  4 - 21 mg/dL Final  . Creatinine 28/41/3244 1.0  0.5 - 1.1 mg/dL Final  . Potassium 01/29/7251 4.0  3.4 - 5.3 mmol/L Final  . Sodium 06/06/2016 142  137 - 147 mmol/L Final  . Alkaline Phosphatase 06/06/2016 82  25 - 125 U/L Final  . ALT 06/06/2016 17  7 - 35 U/L Final  . AST 06/06/2016 24  13 - 35 U/L Final  . Bilirubin, Total 06/06/2016 0.3  mg/dL Final  Admission on 66/44/0347, Discharged on 06/04/2016  No results displayed because visit has over 200 results.      No results found.   Assessment/Plan   ICD-10-CM   1. Chronic anticoagulation Z79.01   2. Early onset Alzheimer's dementia without behavioral disturbance G30.0    F02.80   3. History of recent stroke Z86.73   4. Hypothyroidism due to acquired atrophy of thyroid E03.4   5. Benign essential HTN I10   6. Type 2 diabetes mellitus with other circulatory complication, without long-term current use of insulin (HCC) E11.59   7. Simple chronic bronchitis (HCC) J41.0   8.  Depression with anxiety F41.8   9. Dyslipidemia associated with type 2 diabetes mellitus (HCC) E11.69    E78.5      Cont current meds as ordered  Follow INR. 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 Ancil Linsey  [facility] [address] [place], [place] [address] [phone] Cell (Monday-Friday 8 AM - 5 PM) [phone] After 5 PM and follow prompts

## 2016-07-04 ENCOUNTER — Non-Acute Institutional Stay (SKILLED_NURSING_FACILITY): Payer: Medicare Other | Admitting: Adult Health

## 2016-07-04 ENCOUNTER — Encounter: Payer: Self-pay | Admitting: Adult Health

## 2016-07-04 DIAGNOSIS — J410 Simple chronic bronchitis: Secondary | ICD-10-CM

## 2016-07-04 DIAGNOSIS — E1149 Type 2 diabetes mellitus with other diabetic neurological complication: Secondary | ICD-10-CM | POA: Diagnosis not present

## 2016-07-04 DIAGNOSIS — I10 Essential (primary) hypertension: Secondary | ICD-10-CM

## 2016-07-04 DIAGNOSIS — I358 Other nonrheumatic aortic valve disorders: Secondary | ICD-10-CM

## 2016-07-04 DIAGNOSIS — I639 Cerebral infarction, unspecified: Secondary | ICD-10-CM

## 2016-07-04 NOTE — Progress Notes (Signed)
Location:   [facility] Room Number: 124 A Place of Service:  SNF (31)    CODE STATUS: Full Code  No Known Allergies  Chief Complaint  Patient presents with  . Discharge Note    Discharging to Home    HPI:  She is being discharged to home. Her family is moving in with her to provide 24 hour a day care for her. She will need home health for pt/ot/rn/cv\na/sw. She will not need any dme. She will need her prescriptions to be written and will need to follow up with her medical provider.  She had been hospitalized for acute cva and was admitted to this facility for short term rehab.    Past Medical History:  Diagnosis Date  . COPD (chronic obstructive pulmonary disease) (HCC)    on CT scan 06/28/2015  . Dementia   . Diabetes mellitus   . Hypertension   . Hypothyroidism   . Idiopathic acute pancreatitis 09/16/2011  . 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 Memory loss   . Pancreatitis 2010  . Tobacco abuse   . Trigger finger     Past Surgical History:  Procedure Laterality Date  . BACK SURGERY    . CHOLECYSTECTOMY  2003  . CYST REMOVAL HAND Left 12/11/2014   Procedure: LEFT RING FINGER TENDON SHEATH CYST EXCISION;  Surgeon: Mack Hookavid Thompson, MD;  Location: [facility];  Service: Orthopedics;  Laterality: Left;  . SPINE SURGERY  2006   Dr. Trey SailorsMark Roy - L3-L4, L4-L5 laminotomy and foraminotomy  . TEE WITHOUT CARDIOVERSION N/A 07/03/2015   Procedure: TRANSESOPHAGEAL ECHOCARDIOGRAM (TEE);  Surgeon: Quintella Reichertraci R Turner, MD;  [facility];  Service: Cardiovascular;  Laterality: N/A;    Social History   Social History  . Marital status: Single    Spouse name: N/A  . Number of children: N/A  . Years of education: N/A   Occupational History  . Not on file.   Social History Main Topics  . Smoking status: Former Smoker    Packs/day: 0.50    Types: Cigarettes    Start date: 07/01/2015  . Smokeless tobacco: Never Used     Comment: still in stress.  looking for job.   . Alcohol use No  . Drug use: No  . Sexual activity: Not on file   Other Topics Concern  . Not on file   Social History Narrative   Lives with boyfriend.  No children.  Retired from [employer].  Education: 2 years of college.   Family History  Problem Relation Age of Onset  . Alcohol abuse Father   . Cancer Mother     VITAL SIGNS BP 114/60   Pulse 61   Temp (!) 95.8 F (35.4 C)   Resp 18   Ht 5\' 7"  (1.702 m)   Wt 219 lb (99.3 kg)   SpO2 96%   BMI 34.30 kg/m   Patient's Medications  New Prescriptions   No medications on file  Previous Medications   AMLODIPINE (NORVASC) 10 MG TABLET    Take 10 mg by mouth daily.    ASPIRIN EC 325 MG TABLET    Take 1 tablet (325 mg total) by mouth daily. To be taken along with coumadin until therapeutic; once patient achieve therapeutic INR stop ASA   ATORVASTATIN (LIPITOR) 40 MG TABLET    Take 40 mg by mouth daily.   DONEPEZIL (ARICEPT) 10 MG TABLET    Take 1 tablet (10 mg total)  by mouth at bedtime.   LEVOTHYROXINE (SYNTHROID, LEVOTHROID) 50 MCG TABLET    Take 50 mcg by mouth daily before breakfast.   LISINOPRIL (PRINIVIL,ZESTRIL) 20 MG TABLET    TAKE 1 TABLET (20 MG TOTAL) BY MOUTH DAILY.   METFORMIN (GLUCOPHAGE) 500 MG TABLET    TAKE ONE TABLET BY MOUTH ONCE DAILY   MIRTAZAPINE (REMERON) 15 MG TABLET    Take 15 mg by mouth at bedtime.    SERTRALINE (ZOLOFT) 100 MG TABLET    Take 1 tablet (100 mg total) by mouth 2 (two) times daily.   WARFARIN (COUMADIN) 10 MG TABLET    Take 11 mg by mouth every evening.   Modified Medications   No medications on file  Discontinued Medications   No medications on file     SIGNIFICANT DIAGNOSTIC EXAMS   05-30-16: ct of head: 1. Left occipital infarct explaining the patient's right visual symptoms. Based on the low-attenuation an lack of mass effect, the infarct is not thought to be acute. Recommend clinical correlation with timing of patient's symptom onset. 2. No other  acute abnormalities identified.  05-31-16: chest x-ray: Peribronchial thickening noted. Lungs otherwise grossly clear.  05-31-16: mri of brain: Mild motion degraded examination. Old PCA territory infarcts with superimposed tiny acute LEFT occipital infarct versus artifact. Tiny subacute RIGHT occipital lobe infarct versus motion artifact. Multiple old bilateral small cerebellar infarcts though, though some are new from June 28, 2015. Mild chronic small vessel ischemic disease. Old LEFT basal ganglia lacunar infarct.  05-31-16: ct angio of head and neck: 1. Left P 2/3 segment occlusion correlating with the interval left occipital infarct. 2. Stable anterior intracranial circulation including moderate left cavernous stenosis. 3. Mild atherosclerosis in the neck without stenosis. Bilateral ICA tortuosity. 4. Aortic Atherosclerosis (ICD10-I70.0) and Emphysema (ICD10-J43.9).   06-04-16: 2-d echo:   - Left ventricle: The cavity size was normal. Wall thickness was increased in a pattern of mild LVH. Systolic function was normal. The estimated ejection fraction was in the range of 60% to 65%. Wall motion was normal; there were no regional wall motion abnormalities. Features are consistent with a pseudonormal left ventricular filling pattern, with concomitant abnormal relaxation and increased filling pressure (grade 2 diastolic dysfunction). - Right ventricle: Systolic function was mildly reduced.   LABS REVIEWED:   05-30-16; wbc 6.1; hgb 13.6; hct 43.6; mcv 89.2 plt 132; glucose 100; bun 26; creat 1.01; k+ 3.7; na++ 141; liver normal albumin 3.9; hgb a1 6.5; chol 160; ldl 101; trig 76; hdl 44 05-31-16: tsh 4.816 06-01-16: free T4: 1.13;  RPR: nr; HIV; nr; Vit B12: 567; Vit B1: 91.3 06-04-16: wbc 5.6; hgb 12.8; hct 39.8; mcv 85.8; plt 148; glucose 155; bun 20; creat 1.11; k+ 4.0; na++ 138; liver normal albumin 3.2; INR 1.97  06-06-16: wbc 6.0; hgb 13.7; hct 43.6; mcv 88.4; plt 160; glucose 129; bun 19.2; creat 0.99;  k+ 4.0; na++ 142; liver normal albumin 4.1  06-19-16: glucose 135; bun 16.1; creat 1.09; k+ 4.5; na++ 140 ca 9.5; tsh 0.18   Review of Systems  Constitutional: Negative for malaise/fatigue.  Respiratory: Negative for cough and shortness of breath.   Cardiovascular: Negative for chest pain, palpitations and leg swelling.  Gastrointestinal: Negative for abdominal pain, constipation and heartburn.  Musculoskeletal: Positive for back pain. Negative for joint pain and myalgias.       Has chronic back pain   Skin: Negative.   Neurological: Negative for dizziness.  Psychiatric/Behavioral: The patient is not nervous/anxious.  Physical Exam  Constitutional: No distress.  Overweight   Eyes: Conjunctivae are normal.  Right hemianopia   Neck: Neck supple. No JVD present. No thyromegaly present.  Cardiovascular: Normal rate, regular rhythm and intact distal pulses.   Respiratory: Effort normal and breath sounds normal. No respiratory distress. She has no wheezes.  GI: Soft. Bowel sounds are normal. She exhibits no distension. There is no tenderness.  Musculoskeletal: She exhibits no edema.  Able to move all extremities  Is ambulatory   Lymphadenopathy:    She has no cervical adenopathy.  Neurological: She is alert.  Skin: Skin is warm and dry. She is not diaphoretic.  Psychiatric: She has a normal mood and affect.    ASSESSMENT/ PLAN:  Patient is being discharged with the following home health services:  Pt/ot/rn/cna/sw to evaluate and treat as indicated for gait balance strength adl training medication management coumadin management  adl care; community resources  Patient is being discharged with the following durable medical equipment:  None required  Patient has been advised to f/u with their PCP in 1-2 weeks to bring them up to date on their rehab stay.  Social services at facility was responsible for arranging this appointment.  Pt was provided with a 30 day supply of prescriptions  for medications and refills must be obtained from their PCP.  For controlled substances, a more limited supply may be provided adequate until PCP appointment only.  INR 07-10-16   Time spent with patient  45  minutes >50% time spent counseling; reviewing medical record; tests; labs; and developing future plan of care    Synthia Innocent NP [facility]  Contact [phone] Monday through Friday 8am- 5pm  After hours call [phone]

## 2016-07-21 LAB — PROTIME-INR: PROTIME: 39.5 — AB (ref 10.0–13.8)

## 2016-07-21 LAB — POCT INR: INR: 4.2 — AB (ref 0.9–1.1)

## 2016-07-23 ENCOUNTER — Encounter: Payer: Self-pay | Admitting: Adult Health

## 2016-07-23 ENCOUNTER — Non-Acute Institutional Stay (SKILLED_NURSING_FACILITY): Payer: Medicare Other | Admitting: Adult Health

## 2016-07-23 DIAGNOSIS — Z7901 Long term (current) use of anticoagulants: Secondary | ICD-10-CM

## 2016-07-23 DIAGNOSIS — I358 Other nonrheumatic aortic valve disorders: Secondary | ICD-10-CM

## 2016-07-23 LAB — POCT INR: INR: 3.8 — AB (ref 0.9–1.1)

## 2016-07-23 LAB — PROTIME-INR: Protime: 36.5 — AB (ref 10.0–13.8)

## 2016-07-23 NOTE — Progress Notes (Signed)
Location:   [facility] Room Number: 124 A Place of Service:  SNF (31)   CODE STATUS: Full Code  No Known Allergies  Chief Complaint  Patient presents with  . Acute Visit    Follow up Lab Results    HPI:  Her inr on 07-21-16 was 4.2 and her coumadin had been placed on hold. Her INR today is 3.8. She is tolerating coumadin without difficulty. There are no nursing concerns at this time.    Past Medical History:  Diagnosis Date  . COPD (chronic obstructive pulmonary disease) (HCC)    on CT scan 06/28/2015  . Dementia   . Diabetes mellitus   . Hypertension   . Hypothyroidism   . Idiopathic acute pancreatitis 09/16/2011  . 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 Memory loss   . Pancreatitis 2010  . Tobacco abuse   . Trigger finger     Past Surgical History:  Procedure Laterality Date  . BACK SURGERY    . CHOLECYSTECTOMY  2003  . CYST REMOVAL HAND Left 12/11/2014   Procedure: LEFT RING FINGER TENDON SHEATH CYST EXCISION;  Surgeon: Mack Hookavid Thompson, MD;  Location: [facility];  Service: Orthopedics;  Laterality: Left;  . SPINE SURGERY  2006   Dr. Trey SailorsMark Roy - L3-L4, L4-L5 laminotomy and foraminotomy  . TEE WITHOUT CARDIOVERSION N/A 07/03/2015   Procedure: TRANSESOPHAGEAL ECHOCARDIOGRAM (TEE);  Surgeon: Quintella Reichertraci R Turner, MD;  [facility];  Service: Cardiovascular;  Laterality: N/A;    Social History   Social History  . Marital status: Single    Spouse name: N/A  . Number of children: N/A  . Years of education: N/A   Occupational History  . Not on file.   Social History Main Topics  . Smoking status: Former Smoker    Packs/day: 0.50    Types: Cigarettes    Start date: 07/01/2015  . Smokeless tobacco: Never Used     Comment: still in stress. looking for job.   . Alcohol use No  . Drug use: No  . Sexual activity: Not on file   Other Topics Concern  . Not on file   Social History Narrative   Lives with boyfriend.  No children.   Retired from [employer].  Education: 2 years of college.   Family History  Problem Relation Age of Onset  . Alcohol abuse Father   . Cancer Mother       VITAL SIGNS BP 113/64   Pulse 62   Temp 97.6 F (36.4 C)   Resp 18   Ht 5\' 7"  (1.702 m)   Wt 211 lb 9.6 oz (96 kg)   BMI 33.14 kg/m   Patient's Medications  New Prescriptions   No medications on file  Previous Medications   AMLODIPINE (NORVASC) 10 MG TABLET    Take 10 mg by mouth daily.    ASPIRIN EC 325 MG TABLET    Take 1 tablet (325 mg total) by mouth daily. To be taken along with coumadin until therapeutic; once patient achieve therapeutic INR stop ASA   ATORVASTATIN (LIPITOR) 40 MG TABLET    Take 40 mg by mouth daily.   DONEPEZIL (ARICEPT) 10 MG TABLET    Take 1 tablet (10 mg total) by mouth at bedtime.   LEVOTHYROXINE (SYNTHROID, LEVOTHROID) 50 MCG TABLET    Take 50 mcg by mouth daily before breakfast.   LISINOPRIL (PRINIVIL,ZESTRIL) 20 MG TABLET    TAKE 1 TABLET (20 MG TOTAL)  BY MOUTH DAILY.   METFORMIN (GLUCOPHAGE) 500 MG TABLET    TAKE ONE TABLET BY MOUTH ONCE DAILY   MIRTAZAPINE (REMERON) 15 MG TABLET    Take 15 mg by mouth at bedtime.    SERTRALINE (ZOLOFT) 100 MG TABLET    Take 1 tablet (100 mg total) by mouth 2 (two) times daily.   WARFARIN (COUMADIN) 10 MG TABLET    Take 11 mg by mouth every evening.   Modified Medications   No medications on file  Discontinued Medications   No medications on file     SIGNIFICANT DIAGNOSTIC EXAMS  05-30-16: ct of head: 1. Left occipital infarct explaining the patient's right visual symptoms. Based on the low-attenuation an lack of mass effect, the infarct is not thought to be acute. Recommend clinical correlation with timing of patient's symptom onset. 2. No other acute abnormalities identified.  05-31-16: chest x-ray: Peribronchial thickening noted. Lungs otherwise grossly clear.  05-31-16: mri of brain: Mild motion degraded examination. Old PCA territory infarcts with  superimposed tiny acute LEFT occipital infarct versus artifact. Tiny subacute RIGHT occipital lobe infarct versus motion artifact. Multiple old bilateral small cerebellar infarcts though, though some are new from June 28, 2015. Mild chronic small vessel ischemic disease. Old LEFT basal ganglia lacunar infarct.  05-31-16: ct angio of head and neck: 1. Left P 2/3 segment occlusion correlating with the interval left occipital infarct. 2. Stable anterior intracranial circulation including moderate left cavernous stenosis. 3. Mild atherosclerosis in the neck without stenosis. Bilateral ICA tortuosity. 4. Aortic Atherosclerosis (ICD10-I70.0) and Emphysema (ICD10-J43.9).   06-04-16: 2-d echo:   - Left ventricle: The cavity size was normal. Wall thickness was increased in a pattern of mild LVH. Systolic function was normal. The estimated ejection fraction was in the range of 60% to 65%. Wall motion was normal; there were no regional wall motion abnormalities. Features are consistent with a pseudonormal left ventricular filling pattern, with concomitant abnormal relaxation and increased filling pressure (grade 2 diastolic dysfunction). - Right ventricle: Systolic function was mildly reduced.   LABS REVIEWED:   05-30-16; wbc 6.1; hgb 13.6; hct 43.6; mcv 89.2 plt 132; glucose 100; bun 26; creat 1.01; k+ 3.7; na++ 141; liver normal albumin 3.9; hgb a1 6.5; chol 160; ldl 101; trig 76; hdl 44 05-31-16: tsh 4.816 06-01-16: free T4: 1.13;  RPR: nr; HIV; nr; Vit B12: 567; Vit B1: 91.3 06-04-16: wbc 5.6; hgb 12.8; hct 39.8; mcv 85.8; plt 148; glucose 155; bun 20; creat 1.11; k+ 4.0; na++ 138; liver normal albumin 3.2; INR 1.97  06-06-16: wbc 6.0; hgb 13.7; hct 43.6; mcv 88.4; plt 160; glucose 129; bun 19.2; creat 0.99; k+ 4.0; na++ 142; liver normal albumin 4.1  06-19-16: glucose 135; bun 16.1; creat 1.09; k+ 4.5; na++ 140 ca 9.5; tsh 0.18  07-21-16: INR 4.2 on 11 mg coumadin 07-23-16: INR 3.8 11 mg coumadin on hold      Review of Systems  Constitutional: Negative for malaise/fatigue.  Respiratory: Negative for cough and shortness of breath.   Cardiovascular: Negative for chest pain, palpitations and leg swelling.  Gastrointestinal: Negative for abdominal pain, constipation and heartburn.  Musculoskeletal: Positive for back pain. Negative for joint pain and myalgias.       Has chronic back pain   Skin: Negative.   Neurological: Negative for dizziness.  Psychiatric/Behavioral: The patient is not nervous/anxious.     Physical Exam  Constitutional: No distress.  Overweight   Eyes: Conjunctivae are normal.  Right hemianopia  Neck: Neck supple. No JVD present. No thyromegaly present.  Cardiovascular: Normal rate, regular rhythm and intact distal pulses.   Respiratory: Effort normal and breath sounds normal. No respiratory distress. She has no wheezes.  GI: Soft. Bowel sounds are normal. She exhibits no distension. There is no tenderness.  Musculoskeletal: She exhibits no edema.  Able to move all extremities  Is ambulatory   Lymphadenopathy:    She has no cervical adenopathy.  Neurological: She is alert.  Skin: Skin is warm and dry. She is not diaphoretic.  Psychiatric: She has a normal mood and affect.    ASSESSMENT/ PLAN:  1.  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 Anticoagulation management  For INR 3.8 will hold coumadin today and will repeat INR in the AM>    Synthia Innocent NP [facility]  Contact [phone] Monday through Friday 8am- 5pm  After hours call [phone]

## 2016-07-24 ENCOUNTER — Encounter: Payer: Self-pay | Admitting: Adult Health

## 2016-07-24 ENCOUNTER — Non-Acute Institutional Stay (SKILLED_NURSING_FACILITY): Payer: Medicare Other | Admitting: Adult Health

## 2016-07-24 DIAGNOSIS — I639 Cerebral infarction, unspecified: Secondary | ICD-10-CM | POA: Diagnosis not present

## 2016-07-24 DIAGNOSIS — I358 Other nonrheumatic aortic valve disorders: Secondary | ICD-10-CM | POA: Diagnosis not present

## 2016-07-24 DIAGNOSIS — Z7901 Long term (current) use of anticoagulants: Secondary | ICD-10-CM | POA: Diagnosis not present

## 2016-07-24 LAB — PROTIME-INR: PROTIME: 25.9 — AB (ref 10.0–13.8)

## 2016-07-24 LAB — POCT INR: INR: 2.4 — AB (ref 0.9–1.1)

## 2016-07-24 NOTE — Progress Notes (Signed)
Location:   [facility] Room Number: 124 A Place of Service:  SNF (31)   CODE STATUS: Full Code  No Known Allergies  Chief Complaint  Patient presents with  . Acute Visit    Lab Follow up    HPI:  Her INR today is 2.4 and is within the desired range of 2-3. She is tolerating her coumadin without difficulty. She is not voicing any complaints today.   Past Medical History:  Diagnosis Date  . COPD (chronic obstructive pulmonary disease) (HCC)    on CT scan 06/28/2015  . Dementia   . Diabetes mellitus   . Hypertension   . Hypothyroidism   . Idiopathic acute pancreatitis 09/16/2011  . 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 Memory loss   . Pancreatitis 2010  . Tobacco abuse   . Trigger finger     Past Surgical History:  Procedure Laterality Date  . BACK SURGERY    . CHOLECYSTECTOMY  2003  . CYST REMOVAL HAND Left 12/11/2014   Procedure: LEFT RING FINGER TENDON SHEATH CYST EXCISION;  Surgeon: Mack Hook, MD;  Location: [facility];  Service: Orthopedics;  Laterality: Left;  . SPINE SURGERY  2006   Dr. Trey Sailors - L3-L4, L4-L5 laminotomy and foraminotomy  . TEE WITHOUT CARDIOVERSION N/A 07/03/2015   Procedure: TRANSESOPHAGEAL ECHOCARDIOGRAM (TEE);  Surgeon: Quintella Reichert, MD;  Location: [facility] ENDOSCOPY;  Service: Cardiovascular;  Laterality: N/A;    Social History   Social History  . Marital status: Single    Spouse name: N/A  . Number of children: N/A  . Years of education: N/A   Occupational History  . Not on file.   Social History Main Topics  . Smoking status: Former Smoker    Packs/day: 0.50    Types: Cigarettes    Start date: 07/01/2015  . Smokeless tobacco: Never Used     Comment: still in stress. looking for job.   . Alcohol use No  . Drug use: No  . Sexual activity: Not on file   Other Topics Concern  . Not on file   Social History Narrative   Lives with boyfriend.  No children.  Retired from [employer].  Education:  2 years of college.   Family History  Problem Relation Age of Onset  . Alcohol abuse Father   . Cancer Mother       VITAL SIGNS BP 113/74   Pulse 62   Temp 97.6 F (36.4 C)   Resp 18   Ht 5\' 7"  (1.702 m)   Wt 211 lb 9.6 oz (96 kg)   SpO2 96%   BMI 33.14 kg/m   Patient's Medications  New Prescriptions   No medications on file  Previous Medications   AMLODIPINE (NORVASC) 10 MG TABLET    Take 10 mg by mouth daily.    ASPIRIN EC 325 MG TABLET    Take 1 tablet (325 mg total) by mouth daily. To be taken along with coumadin until therapeutic; once patient achieve therapeutic INR stop ASA   ATORVASTATIN (LIPITOR) 40 MG TABLET    Take 40 mg by mouth daily.   DONEPEZIL (ARICEPT) 10 MG TABLET    Take 1 tablet (10 mg total) by mouth at bedtime.   LEVOTHYROXINE (SYNTHROID, LEVOTHROID) 50 MCG TABLET    Take 50 mcg by mouth daily before breakfast.   LISINOPRIL (PRINIVIL,ZESTRIL) 20 MG TABLET    TAKE 1 TABLET (20 MG TOTAL) BY MOUTH DAILY.  METFORMIN (GLUCOPHAGE) 500 MG TABLET    TAKE ONE TABLET BY MOUTH ONCE DAILY   MIRTAZAPINE (REMERON) 15 MG TABLET    Take 15 mg by mouth at bedtime.    SERTRALINE (ZOLOFT) 100 MG TABLET    Take 1 tablet (100 mg total) by mouth 2 (two) times daily.   WARFARIN (COUMADIN) 10 MG TABLET    Take 11 mg by mouth every evening.   Modified Medications   No medications on file  Discontinued Medications   No medications on file     SIGNIFICANT DIAGNOSTIC EXAMS   05-30-16: ct of head: 1. Left occipital infarct explaining the patient's right visual symptoms. Based on the low-attenuation an lack of mass effect, the infarct is not thought to be acute. Recommend clinical correlation with timing of patient's symptom onset. 2. No other acute abnormalities identified.  05-31-16: chest x-ray: Peribronchial thickening noted. Lungs otherwise grossly clear.  05-31-16: mri of brain: Mild motion degraded examination. Old PCA territory infarcts with superimposed tiny acute LEFT  occipital infarct versus artifact. Tiny subacute RIGHT occipital lobe infarct versus motion artifact. Multiple old bilateral small cerebellar infarcts though, though some are new from June 28, 2015. Mild chronic small vessel ischemic disease. Old LEFT basal ganglia lacunar infarct.  05-31-16: ct angio of head and neck: 1. Left P 2/3 segment occlusion correlating with the interval left occipital infarct. 2. Stable anterior intracranial circulation including moderate left cavernous stenosis. 3. Mild atherosclerosis in the neck without stenosis. Bilateral ICA tortuosity. 4. Aortic Atherosclerosis (ICD10-I70.0) and Emphysema (ICD10-J43.9).   06-04-16: 2-d echo:   - Left ventricle: The cavity size was normal. Wall thickness was increased in a pattern of mild LVH. Systolic function was normal. The estimated ejection fraction was in the range of 60% to 65%. Wall motion was normal; there were no regional wall motion abnormalities. Features are consistent with a pseudonormal left ventricular filling pattern, with concomitant abnormal relaxation and increased filling pressure (grade 2 diastolic dysfunction). - Right ventricle: Systolic function was mildly reduced.   LABS REVIEWED:   05-30-16; wbc 6.1; hgb 13.6; hct 43.6; mcv 89.2 plt 132; glucose 100; bun 26; creat 1.01; k+ 3.7; na++ 141; liver normal albumin 3.9; hgb a1 6.5; chol 160; ldl 101; trig 76; hdl 44 05-31-16: tsh 4.816 06-01-16: free T4: 1.13;  RPR: nr; HIV; nr; Vit B12: 567; Vit B1: 91.3 06-04-16: wbc 5.6; hgb 12.8; hct 39.8; mcv 85.8; plt 148; glucose 155; bun 20; creat 1.11; k+ 4.0; na++ 138; liver normal albumin 3.2; INR 1.97  06-06-16: wbc 6.0; hgb 13.7; hct 43.6; mcv 88.4; plt 160; glucose 129; bun 19.2; creat 0.99; k+ 4.0; na++ 142; liver normal albumin 4.1  06-19-16: glucose 135; bun 16.1; creat 1.09; k+ 4.5; na++ 140 ca 9.5; tsh 0.18  07-21-16: INR 4.2 on 11 mg coumadin 07-23-16: INR 3.8 11 mg coumadin on hold  07-24-16: INR 2.4    Review of  Systems  Constitutional: Negative for malaise/fatigue.  Respiratory: Negative for cough and shortness of breath.   Cardiovascular: Negative for chest pain, palpitations and leg swelling.  Gastrointestinal: Negative for abdominal pain, constipation and heartburn.  Musculoskeletal: Positive for back pain. Negative for joint pain and myalgias.       Has chronic back pain   Skin: Negative.   Neurological: Negative for dizziness.  Psychiatric/Behavioral: The patient is not nervous/anxious.     Physical Exam  Constitutional: No distress.  Overweight   Eyes: Conjunctivae are normal.  Right hemianopia   Neck:  Neck supple. No JVD present. No thyromegaly present.  Cardiovascular: Normal rate, regular rhythm and intact distal pulses.   Respiratory: Effort normal and breath sounds normal. No respiratory distress. She has no wheezes.  GI: Soft. Bowel sounds are normal. She exhibits no distension. There is no tenderness.  Musculoskeletal: She exhibits no edema.  Able to move all extremities  Is ambulatory   Lymphadenopathy:    She has no cervical adenopathy.  Neurological: She is alert.  Skin: Skin is warm and dry. She is not diaphoretic.  Psychiatric: She has a normal mood and affect.    ASSESSMENT/ PLAN:  1.  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 CVA  3. Anticoagulation management  For INR 2.4  will restart coumadin at 11 mg daily and will check INR on 07-29-16.     Synthia Innocenteborah Green NP [facility]  Contact [phone] Monday through Friday 8am- 5pm  After hours call [phone]

## 2016-07-28 ENCOUNTER — Non-Acute Institutional Stay (SKILLED_NURSING_FACILITY): Payer: Medicare Other | Admitting: Internal Medicine

## 2016-07-28 ENCOUNTER — Encounter: Payer: Self-pay | Admitting: Internal Medicine

## 2016-07-28 DIAGNOSIS — E034 Atrophy of thyroid (acquired): Secondary | ICD-10-CM | POA: Diagnosis not present

## 2016-07-28 DIAGNOSIS — F0280 Dementia in other diseases classified elsewhere without behavioral disturbance: Secondary | ICD-10-CM

## 2016-07-28 DIAGNOSIS — Z7901 Long term (current) use of anticoagulants: Secondary | ICD-10-CM

## 2016-07-28 DIAGNOSIS — E1149 Type 2 diabetes mellitus with other diabetic neurological complication: Secondary | ICD-10-CM | POA: Diagnosis not present

## 2016-07-28 DIAGNOSIS — E785 Hyperlipidemia, unspecified: Secondary | ICD-10-CM

## 2016-07-28 DIAGNOSIS — E1169 Type 2 diabetes mellitus with other specified complication: Secondary | ICD-10-CM | POA: Diagnosis not present

## 2016-07-28 DIAGNOSIS — F418 Other specified anxiety disorders: Secondary | ICD-10-CM | POA: Diagnosis not present

## 2016-07-28 DIAGNOSIS — I10 Essential (primary) hypertension: Secondary | ICD-10-CM

## 2016-07-28 DIAGNOSIS — I358 Other nonrheumatic aortic valve disorders: Secondary | ICD-10-CM | POA: Diagnosis not present

## 2016-07-28 DIAGNOSIS — J410 Simple chronic bronchitis: Secondary | ICD-10-CM | POA: Diagnosis not present

## 2016-07-28 DIAGNOSIS — Z8673 Personal history of transient ischemic attack (TIA), and cerebral infarction without residual deficits: Secondary | ICD-10-CM | POA: Diagnosis not present

## 2016-07-28 DIAGNOSIS — G300 Alzheimer's disease with early onset: Secondary | ICD-10-CM

## 2016-07-28 NOTE — Progress Notes (Signed)
Patient ID: [name], female   DOB: [birth year], 66 y.o.   MRN: [id number]    DATE:  07/28/2016  Location:    [facility] Room Number: 124 A Place of Service: SNF (31)   Extended Emergency Contact Information Primary Emergency Contact: Cameron Proud [place] Home Phone: [phone] Relation: Friend Secondary Emergency Contact: Inocencio Homes [place] Home Phone: [phone] Mobile Phone: [phone] Relation: Friend  Advanced Directive information Does Patient Have a Medical Advance Directive?: Yes, Would patient like information on creating a medical advance directive?: No - Patient declined, Type of Advance Directive: Out of facility DNR (pink MOST or yellow form), Pre-existing out of facility DNR order (yellow form or pink MOST form): Pink MOST form placed in chart (order not valid for inpatient use), Does patient want to make changes to medical advance directive?: No - Patient declined  Chief Complaint  Patient presents with  . Medical Management of Chronic Issues    Routine visit    HPI:  66 yo female short term resident seen today for f/u. She is voicing no concerns. No bleeding or easy brusing. No CP/SOB. She reports poor memory but unchanged. She is a poor historian due to dementia. Hx obtained from chart. Appetite ok. Sleeps well. INR therapeutic.  Hypertension - stable on lisinopril 20 mg daily; norvasc 10 mg daily   Dyslipidemia - stable on lipitor 40 mg daily . LDL 101  COPD/hx tobacco abuse - stable without meds. She quit smoking several mos ago  DM - controlled. A1c 6.5%.  Takes metformin 500 mg daily   Hypothyroidism - TSH 0.18; free T4: 1.13. Takes synthroid 50 mcg daily   Alzheimer's disease - stable on aricept 10 mg daily. She wears a  wandergard  Depression with anxiety - mood stable on zoloft 100 mg twice daily; remeron 15 mg nightly; seroquel 25 mg nightly   Hx CVA - recent b/l occipital. She has  right sided hemianopia and slurred speech. 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 Takes coumadin 11 mg daily. INR 2.4 on 07/24/16. Goal INR 2-3. She is still taking ASA daily   Past Medical History:  Diagnosis Date  . COPD (chronic obstructive pulmonary disease) (HCC)    on CT scan 06/28/2015  . Dementia   . Diabetes mellitus   . Hypertension   . Hypothyroidism   . Idiopathic acute pancreatitis 09/16/2011  . 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 Memory loss   . Pancreatitis 2010  . Tobacco abuse   . Trigger finger     Past Surgical History:  Procedure Laterality Date  . BACK SURGERY    . CHOLECYSTECTOMY  2003  . CYST REMOVAL HAND Left 12/11/2014   Procedure: LEFT RING FINGER TENDON SHEATH CYST EXCISION;  Surgeon: Mack Hook, MD;  Location: [facility];  Service: Orthopedics;  Laterality: Left;  . SPINE SURGERY  2006   Dr. Trey Sailors - L3-L4, L4-L5 laminotomy and foraminotomy  . TEE WITHOUT CARDIOVERSION N/A 07/03/2015   Procedure: TRANSESOPHAGEAL ECHOCARDIOGRAM (TEE);  Surgeon: Quintella Reichert, MD;  Location: [facility] ENDOSCOPY;  Service: Cardiovascular;  Laterality: N/A;    Patient Care Team: Kirby Funk, MD as PCP - General (Internal Medicine) Jamas Lav, MD as Consulting Physician (Psychiatry) Trey Sailors, MD as Consulting Physician (Neurosurgery)  Social History   Social History  . Marital status: Single    Spouse name: N/A  . Number of  children: N/A  . Years of education: N/A   Occupational History  . Not on file.   Social History Main Topics  . Smoking status: Former Smoker    Packs/day: 0.50    Types: Cigarettes    Start date: 07/01/2015  . Smokeless tobacco: Never Used     Comment: still in stress. looking for job.   . Alcohol use No  . Drug use: No  . Sexual activity: Not on file   Other Topics Concern  . Not on file   Social History Narrative   Lives with boyfriend.  No children.   Retired from [employer].  Education: 2 years of college.     reports that she has quit smoking. Her smoking use included Cigarettes. She started smoking about 12 months ago. She smoked 0.50 packs per day. She has never used smokeless tobacco. She reports that she does not drink alcohol or use drugs.  Family History  Problem Relation Age of Onset  . Alcohol abuse Father   . Cancer Mother    Family Status  Relation Status  . Father Deceased  . Mother Deceased    Immunization History  Administered Date(s) Administered  . Influenza Split 11/03/2011  . Influenza,inj,Quad PF,36+ Mos 11/17/2012  . Tdap 12/19/2011    No Known Allergies  Medications: Patient's Medications  New Prescriptions   No medications on file  Previous Medications   AMLODIPINE (NORVASC) 10 MG TABLET    Take 10 mg by mouth daily.    ASPIRIN EC 325 MG TABLET    Take 1 tablet (325 mg total) by mouth daily. To be taken along with coumadin until therapeutic; once patient achieve therapeutic INR stop ASA   ATORVASTATIN (LIPITOR) 40 MG TABLET    Take 40 mg by mouth daily.   DONEPEZIL (ARICEPT) 10 MG TABLET    Take 1 tablet (10 mg total) by mouth at bedtime.   LEVOTHYROXINE (SYNTHROID, LEVOTHROID) 50 MCG TABLET    Take 50 mcg by mouth daily before breakfast.   LISINOPRIL (PRINIVIL,ZESTRIL) 20 MG TABLET    TAKE 1 TABLET (20 MG TOTAL) BY MOUTH DAILY.   METFORMIN (GLUCOPHAGE) 500 MG TABLET    TAKE ONE TABLET BY MOUTH ONCE DAILY   MIRTAZAPINE (REMERON) 15 MG TABLET    Take 15 mg by mouth at bedtime.    SERTRALINE (ZOLOFT) 100 MG TABLET    Take 1 tablet (100 mg total) by mouth 2 (two) times daily.   WARFARIN (COUMADIN) 10 MG TABLET    Take 11 mg by mouth every evening.   Modified Medications   No medications on file  Discontinued Medications   No medications on file    Review of Systems  Unable to perform ROS: Dementia    Vitals:   07/28/16 1312  BP: 113/64  Pulse: 62  Resp: 18  Temp: 97.6 F (36.4 C)    TempSrc: Oral  SpO2: 96%  Weight: 208 lb 6.4 oz (94.5 kg)   Body mass index is 32.64 kg/m.  Physical Exam  Constitutional: She appears well-developed and well-nourished.  Looks well sitting on edge of bed in NAD  HENT:  Mouth/Throat: Oropharynx is clear and moist. No oropharyngeal exudate.  MMM; no oral thrush  Eyes: Pupils are equal, round, and reactive to light. No scleral icterus.  Neck: Neck supple. Carotid bruit is not present. No tracheal deviation present. No thyromegaly present.  Cardiovascular: Normal rate, regular rhythm and intact distal pulses.  Exam reveals no gallop and  no friction rub.   Murmur (1/6 SEM) heard. No LE edema b/l. no calf TTP.   Pulmonary/Chest: Effort normal and breath sounds normal. No stridor. No respiratory distress. She has no wheezes. She has no rales.  Abdominal: Soft. Normal appearance and bowel sounds are normal. She exhibits no distension and no mass. There is no hepatomegaly. There is no tenderness. There is no rigidity, no rebound and no guarding. No hernia.  Musculoskeletal: She exhibits edema.  Lymphadenopathy:    She has no cervical adenopathy.  Neurological: She is alert.  Skin: Skin is warm and dry. No rash noted.  Psychiatric: She has a normal mood and affect. Her behavior is normal. Thought content normal.  Slurred speech     Labs reviewed: Nursing Home on 07/24/2016  Component Date Value Ref Range Status  . INR 07/24/2016 2.4* 0.9 - 1.1 Final  . Protime 07/24/2016 25.9* 10.0 - 13.8 Final  Nursing Home on 07/23/2016  Component Date Value Ref Range Status  . INR 07/21/2016 4.2* 0.9 - 1.1 Final  . Protime 07/21/2016 39.5* 10.0 - 13.8 Final  . INR 07/01/2016 3.9* 0.9 - 1.1 Final  . Protime 07/01/2016 38.5* 10.0 - 13.8 Final  . INR 07/23/2016 3.8* 0.9 - 1.1 Final  . Protime 07/23/2016 36.5* 10.0 - 13.8 Final  Nursing Home on 07/03/2016  Component Date Value Ref Range Status  . INR 06/25/2016 1.3* 0.9 - 1.1 Final  . Protime  06/25/2016 16.4* 10.0 - 13.8 Final  . TSH 06/19/2016 0.18* 0.41 - 5.90 Final  . INR 06/27/2016 2.5* 0.9 - 1.1 Final  . Protime 06/27/2016 27.0* 10.0 - 13.8 Final  Nursing Home on 06/27/2016  Component Date Value Ref Range Status  . Glucose 06/19/2016 135  mg/dL Final  . BUN 16/10/9602 16  4 - 21 mg/dL Final  . Creatinine 54/09/8117 1.1  0.5 - 1.1 mg/dL Final  . Potassium 14/78/2956 4.5  3.4 - 5.3 mmol/L Final  . Sodium 06/19/2016 140  137 - 147 mmol/L Final  . INR 06/24/2016 1.1  0.9 - 1.1 Final  . Protime 06/24/2016 14.4* 10.0 - 13.8 seconds Final  . INR 06/20/2016 4.3* 0.9 - 1.1 Final  . Protime 06/20/2016 41.2* 10.0 - 13.8 seconds Final  . INR 06/13/2016 3.6* 0.9 - 1.1 Final  . Protime 06/13/2016 35.9* 10.0 - 13.8 seconds Final  Abstract on 06/06/2016  Component Date Value Ref Range Status  . INR 06/06/2016 1.5* 0.9 - 1.1 Final  . Hemoglobin 06/06/2016 13.7  12.0 - 16.0 g/dL Final  . HCT 21/30/8657 44  36 - 46 % Final  . Neutrophils Absolute 06/06/2016 3  /L Final  . Platelets 06/06/2016 160  150 - 399 K/L Final  . WBC 06/06/2016 6.0  10^3/mL Final  . Protime 06/06/2016 18.1* 10.0 - 13.8 seconds Final  . Glucose 06/06/2016 129  mg/dL Final  . BUN 84/69/6295 19  4 - 21 mg/dL Final  . Creatinine 28/41/3244 1.0  0.5 - 1.1 mg/dL Final  . Potassium 01/29/7251 4.0  3.4 - 5.3 mmol/L Final  . Sodium 06/06/2016 142  137 - 147 mmol/L Final  . Alkaline Phosphatase 06/06/2016 82  25 - 125 U/L Final  . ALT 06/06/2016 17  7 - 35 U/L Final  . AST 06/06/2016 24  13 - 35 U/L Final  . Bilirubin, Total 06/06/2016 0.3  mg/dL Final  Admission on 66/44/0347, Discharged on 06/04/2016  No results displayed because visit has over 200 results.  No results found.   Assessment/Plan   ICD-10-CM   1. Hypothyroidism due to acquired atrophy of thyroid - borderline control E03.4   2. 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 Chronic anticoagulation Z79.01   4. History of recent stroke  Z86.73   5. Benign essential HTN I10   6. Simple chronic bronchitis (HCC) J41.0   7. 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 Early onset Alzheimer's dementia without behavioral disturbance G30.0    F02.80   9. Dyslipidemia associated with type 2 diabetes mellitus (HCC) E11.69    E78.5   10. Depression with anxiety F41.8     Check TSH  STOP ASA as INR therapeutic  Cont other meds as ordered  Follow INR closely. GOAL INR 2-3  Await d/c home. She states family member was held up in [place] dealing with ill parent.  Will follow  Phinley Schall S. Ancil Linsey  [facility] [address] [place], [place] [address] [phone] Cell (Monday-Friday 8 AM - 5 PM) [phone] After 5 PM and follow prompts

## 2016-07-29 ENCOUNTER — Encounter: Payer: Self-pay | Admitting: Adult Health

## 2016-07-29 ENCOUNTER — Non-Acute Institutional Stay (SKILLED_NURSING_FACILITY): Payer: Medicare Other | Admitting: Adult Health

## 2016-07-29 DIAGNOSIS — E034 Atrophy of thyroid (acquired): Secondary | ICD-10-CM | POA: Diagnosis not present

## 2016-07-29 DIAGNOSIS — I639 Cerebral infarction, unspecified: Secondary | ICD-10-CM

## 2016-07-29 DIAGNOSIS — Z7901 Long term (current) use of anticoagulants: Secondary | ICD-10-CM

## 2016-07-29 DIAGNOSIS — I358 Other nonrheumatic aortic valve disorders: Secondary | ICD-10-CM

## 2016-07-29 LAB — POCT INR: INR: 4.9 — AB (ref 0.9–1.1)

## 2016-07-29 LAB — PROTIME-INR: PROTIME: 43.8 — AB (ref 10.0–13.8)

## 2016-07-29 LAB — TSH: TSH: 0.1 — AB (ref 0.41–5.90)

## 2016-07-29 NOTE — Progress Notes (Signed)
Location:   [facility] Room Number: 124 A Place of Service:  SNF (31)   CODE STATUS: Full Code  No Known Allergies  Chief Complaint  Patient presents with  . Acute Visit    Follow up Lab Results    HPI:  Her INR is 4.9 on she is taking 11 mg coumadin daily. There are no indications of bleeding present. Her tsh is low at 0.10. She tells me that she feels good. There are no nursing concerns at this time.    Past Medical History:  Diagnosis Date  . COPD (chronic obstructive pulmonary disease) (HCC)    on CT scan 06/28/2015  . Dementia   . Diabetes mellitus   . Hypertension   . Hypothyroidism   . Idiopathic acute pancreatitis 09/16/2011  . 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 Memory loss   . Pancreatitis 2010  . Tobacco abuse   . Trigger finger     Past Surgical History:  Procedure Laterality Date  . BACK SURGERY    . CHOLECYSTECTOMY  2003  . CYST REMOVAL HAND Left 12/11/2014   Procedure: LEFT RING FINGER TENDON SHEATH CYST EXCISION;  Surgeon: Mack Hook, MD;  Location: [facility];  Service: Orthopedics;  Laterality: Left;  . SPINE SURGERY  2006   Dr. Trey Sailors - L3-L4, L4-L5 laminotomy and foraminotomy  . TEE WITHOUT CARDIOVERSION N/A 07/03/2015   Procedure: TRANSESOPHAGEAL ECHOCARDIOGRAM (TEE);  Surgeon: Quintella Reichert, MD;  Location: [name] ENDOSCOPY;  Service: Cardiovascular;  Laterality: N/A;    Social History   Social History  . Marital status: Single    Spouse name: N/A  . Number of children: N/A  . Years of education: N/A   Occupational History  . Not on file.   Social History Main Topics  . Smoking status: Former Smoker    Packs/day: 0.50    Types: Cigarettes    Start date: 07/01/2015  . Smokeless tobacco: Never Used     Comment: still in stress. looking for job.   . Alcohol use No  . Drug use: No  . Sexual activity: Not on file   Other Topics Concern  . Not on file   Social History Narrative   Lives with  boyfriend.  No children.  Retired from [employer].  Education: 2 years of college.   Family History  Problem Relation Age of Onset  . Alcohol abuse Father   . Cancer Mother       VITAL SIGNS BP 113/64   Pulse 62   Temp 97.6 F (36.4 C)   Resp 18   Ht 5\' 7"  (1.702 m)   Wt 208 lb 6.4 oz (94.5 kg)   SpO2 96%   BMI 32.64 kg/m   Patient's Medications  New Prescriptions   No medications on file  Previous Medications   AMLODIPINE (NORVASC) 10 MG TABLET    Take 10 mg by mouth daily.    ASPIRIN EC 325 MG TABLET    Take 1 tablet (325 mg total) by mouth daily. To be taken along with coumadin until therapeutic; once patient achieve therapeutic INR stop ASA   ATORVASTATIN (LIPITOR) 40 MG TABLET    Take 40 mg by mouth daily.   DONEPEZIL (ARICEPT) 10 MG TABLET    Take 1 tablet (10 mg total) by mouth at bedtime.   LEVOTHYROXINE (SYNTHROID, LEVOTHROID) 50 MCG TABLET    Take 50 mcg by mouth daily before breakfast.   LISINOPRIL (PRINIVIL,ZESTRIL) 20  MG TABLET    TAKE 1 TABLET (20 MG TOTAL) BY MOUTH DAILY.   METFORMIN (GLUCOPHAGE) 500 MG TABLET    TAKE ONE TABLET BY MOUTH ONCE DAILY   MIRTAZAPINE (REMERON) 15 MG TABLET    Take 15 mg by mouth at bedtime.    SERTRALINE (ZOLOFT) 100 MG TABLET    Take 1 tablet (100 mg total) by mouth 2 (two) times daily.   WARFARIN (COUMADIN) 10 MG TABLET    Take 11 mg by mouth every evening.   Modified Medications   No medications on file  Discontinued Medications   No medications on file     SIGNIFICANT DIAGNOSTIC EXAMS  05-30-16: ct of head: 1. Left occipital infarct explaining the patient's right visual symptoms. Based on the low-attenuation an lack of mass effect, the infarct is not thought to be acute. Recommend clinical correlation with timing of patient's symptom onset. 2. No other acute abnormalities identified.  05-31-16: chest x-ray: Peribronchial thickening noted. Lungs otherwise grossly clear.  05-31-16: mri of brain: Mild motion degraded  examination. Old PCA territory infarcts with superimposed tiny acute LEFT occipital infarct versus artifact. Tiny subacute RIGHT occipital lobe infarct versus motion artifact. Multiple old bilateral small cerebellar infarcts though, though some are new from June 28, 2015. Mild chronic small vessel ischemic disease. Old LEFT basal ganglia lacunar infarct.  05-31-16: ct angio of head and neck: 1. Left P 2/3 segment occlusion correlating with the interval left occipital infarct. 2. Stable anterior intracranial circulation including moderate left cavernous stenosis. 3. Mild atherosclerosis in the neck without stenosis. Bilateral ICA tortuosity. 4. Aortic Atherosclerosis (ICD10-I70.0) and Emphysema (ICD10-J43.9).   06-04-16: 2-d echo:   - Left ventricle: The cavity size was normal. Wall thickness was increased in a pattern of mild LVH. Systolic function was normal. The estimated ejection fraction was in the range of 60% to 65%. Wall motion was normal; there were no regional wall motion abnormalities. Features are consistent with a pseudonormal left ventricular filling pattern, with concomitant abnormal relaxation and increased filling pressure (grade 2 diastolic dysfunction). - Right ventricle: Systolic function was mildly reduced.   LABS REVIEWED:   05-30-16; wbc 6.1; hgb 13.6; hct 43.6; mcv 89.2 plt 132; glucose 100; bun 26; creat 1.01; k+ 3.7; na++ 141; liver normal albumin 3.9; hgb a1 6.5; chol 160; ldl 101; trig 76; hdl 44 05-31-16: tsh 4.816 06-01-16: free T4: 1.13;  RPR: nr; HIV; nr; Vit B12: 567; Vit B1: 91.3 06-04-16: wbc 5.6; hgb 12.8; hct 39.8; mcv 85.8; plt 148; glucose 155; bun 20; creat 1.11; k+ 4.0; na++ 138; liver normal albumin 3.2; INR 1.97  06-06-16: wbc 6.0; hgb 13.7; hct 43.6; mcv 88.4; plt 160; glucose 129; bun 19.2; creat 0.99; k+ 4.0; na++ 142; liver normal albumin 4.1  06-19-16: glucose 135; bun 16.1; creat 1.09; k+ 4.5; na++ 140 ca 9.5; tsh 0.18  07-21-16: INR 4.2 on 11 mg  coumadin 07-23-16: INR 3.8 11 mg coumadin on hold  07-24-16: INR 2.4  07-29-16: INR 4. 9 on 11 mg coumadin; tsh 0.10    Review of Systems  Constitutional: Negative for malaise/fatigue.  Respiratory: Negative for cough and shortness of breath.   Cardiovascular: Negative for chest pain, palpitations and leg swelling.  Gastrointestinal: Negative for abdominal pain, constipation and heartburn.  Musculoskeletal: Positive for back pain. Negative for joint pain and myalgias.       Has chronic back pain   Skin: Negative.   Neurological: Negative for dizziness.  Psychiatric/Behavioral: The patient is  not nervous/anxious.     Physical Exam  Constitutional: No distress.  Overweight   Eyes: Conjunctivae are normal.  Right hemianopia   Neck: Neck supple. No JVD present. No thyromegaly present.  Cardiovascular: Normal rate, regular rhythm and intact distal pulses.   Respiratory: Effort normal and breath sounds normal. No respiratory distress. She has no wheezes.  GI: Soft. Bowel sounds are normal. She exhibits no distension. There is no tenderness.  Musculoskeletal: She exhibits no edema.  Able to move all extremities  Is ambulatory   Lymphadenopathy:    She has no cervical adenopathy.  Neurological: She is alert.  Skin: Skin is warm and dry. She is not diaphoretic.  Psychiatric: She has a normal mood and affect.    ASSESSMENT/ PLAN:  1.  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 CVA  3. Anticoagulation management 4. hypothyroidism   For INR 4.9 will coumadin will check inr on 07-31-16 For tsh 0.10: will change the dose to 25 mcg and will check tsh in 6 weeks   Time spent with patient 30   minutes >50% time spent counseling; reviewing medical record; tests; labs; and developing future plan of care    MD is aware of resident's narcotic use and is in agreement with current plan of care. We will attempt to wean resident as apropriate    Synthia Innocent NP [facility]  Contact  [phone] Monday through Friday 8am- 5pm  After hours call [phone]

## 2016-07-30 LAB — PROTIME-INR: Protime: 30.6 — AB (ref 10.0–13.8)

## 2016-07-30 LAB — POCT INR: INR: 3 — AB (ref 0.9–1.1)

## 2016-07-31 ENCOUNTER — Encounter: Payer: Self-pay | Admitting: Adult Health

## 2016-07-31 ENCOUNTER — Non-Acute Institutional Stay (SKILLED_NURSING_FACILITY): Payer: Medicare Other | Admitting: Adult Health

## 2016-07-31 DIAGNOSIS — Z7901 Long term (current) use of anticoagulants: Secondary | ICD-10-CM

## 2016-07-31 DIAGNOSIS — I358 Other nonrheumatic aortic valve disorders: Secondary | ICD-10-CM | POA: Diagnosis not present

## 2016-07-31 DIAGNOSIS — I639 Cerebral infarction, unspecified: Secondary | ICD-10-CM

## 2016-07-31 NOTE — Progress Notes (Signed)
Location:   [facility] Room Number: 124 A Place of Service:  SNF (31)   CODE STATUS:  Full Code  No Known Allergies  Chief Complaint  Patient presents with  . Acute Visit    INR follow up    HPI:  Her INR today is 3.0; her coumadin has been held for 2 days. The goal of her INR is 2-3. She is tolerating coumadin without any difficulties. There are no nursing concerns at this time.    Past Medical History:  Diagnosis Date  . COPD (chronic obstructive pulmonary disease) (HCC)    on CT scan 06/28/2015  . Dementia   . Diabetes mellitus   . Hypertension   . Hypothyroidism   . Idiopathic acute pancreatitis 09/16/2011  . 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 Memory loss   . Pancreatitis 2010  . Tobacco abuse   . Trigger finger     Past Surgical History:  Procedure Laterality Date  . BACK SURGERY    . CHOLECYSTECTOMY  2003  . CYST REMOVAL HAND Left 12/11/2014   Procedure: LEFT RING FINGER TENDON SHEATH CYST EXCISION;  Surgeon: Mack Hookavid Thompson, MD;  Location: [facility];  Service: Orthopedics;  Laterality: Left;  . SPINE SURGERY  2006   Dr. Trey SailorsMark Roy - L3-L4, L4-L5 laminotomy and foraminotomy  . TEE WITHOUT CARDIOVERSION N/A 07/03/2015   Procedure: TRANSESOPHAGEAL ECHOCARDIOGRAM (TEE);  Surgeon: Quintella Reichertraci R Turner, MD;  Location: [facility];  Service: Cardiovascular;  Laterality: N/A;    Social History   Social History  . Marital status: Single    Spouse name: N/A  . Number of children: N/A  . Years of education: N/A   Occupational History  . Not on file.   Social History Main Topics  . Smoking status: Former Smoker    Packs/day: 0.50    Types: Cigarettes    Start date: 07/01/2015  . Smokeless tobacco: Never Used     Comment: still in stress. looking for job.   . Alcohol use No  . Drug use: No  . Sexual activity: Not on file   Other Topics Concern  . Not on file   Social History Narrative   Lives with boyfriend.  No children.   Retired from [employer].  Education: 2 years of college.   Family History  Problem Relation Age of Onset  . Alcohol abuse Father   . Cancer Mother       VITAL SIGNS BP 113/64   Pulse 62   Temp 97.6 F (36.4 C)   Resp 18   Ht 5\' 2"  (1.575 m)   Wt 217 lb 9.6 oz (98.7 kg)   SpO2 98%   BMI 39.80 kg/m   Patient's Medications  New Prescriptions   No medications on file  Previous Medications   AMLODIPINE (NORVASC) 10 MG TABLET    Take 10 mg by mouth daily.    ATORVASTATIN (LIPITOR) 40 MG TABLET    Take 40 mg by mouth daily.   DONEPEZIL (ARICEPT) 10 MG TABLET    Take 1 tablet (10 mg total) by mouth at bedtime.   LEVOTHYROXINE (SYNTHROID, LEVOTHROID) 25 MCG TABLET    Take 25 mcg by mouth daily before breakfast.    LISINOPRIL (PRINIVIL,ZESTRIL) 20 MG TABLET    TAKE 1 TABLET (20 MG TOTAL) BY MOUTH DAILY.   METFORMIN (GLUCOPHAGE) 500 MG TABLET    TAKE ONE TABLET BY MOUTH ONCE DAILY   MIRTAZAPINE (REMERON) 15 MG TABLET  Take 15 mg by mouth at bedtime.    SERTRALINE (ZOLOFT) 100 MG TABLET    Take 1 tablet (100 mg total) by mouth 2 (two) times daily.   WARFARIN (COUMADIN) 10 MG TABLET    Take 11 mg by mouth every evening.   Modified Medications   No medications on file  Discontinued Medications   ASPIRIN EC 325 MG TABLET    Take 1 tablet (325 mg total) by mouth daily. To be taken along with coumadin until therapeutic; once patient achieve therapeutic INR stop ASA     SIGNIFICANT DIAGNOSTIC EXAMS   05-30-16: ct of head: 1. Left occipital infarct explaining the patient's right visual symptoms. Based on the low-attenuation an lack of mass effect, the infarct is not thought to be acute. Recommend clinical correlation with timing of patient's symptom onset. 2. No other acute abnormalities identified.  05-31-16: chest x-ray: Peribronchial thickening noted. Lungs otherwise grossly clear.  05-31-16: mri of brain: Mild motion degraded examination. Old PCA territory infarcts with  superimposed tiny acute LEFT occipital infarct versus artifact. Tiny subacute RIGHT occipital lobe infarct versus motion artifact. Multiple old bilateral small cerebellar infarcts though, though some are new from June 28, 2015. Mild chronic small vessel ischemic disease. Old LEFT basal ganglia lacunar infarct.  05-31-16: ct angio of head and neck: 1. Left P 2/3 segment occlusion correlating with the interval left occipital infarct. 2. Stable anterior intracranial circulation including moderate left cavernous stenosis. 3. Mild atherosclerosis in the neck without stenosis. Bilateral ICA tortuosity. 4. Aortic Atherosclerosis (ICD10-I70.0) and Emphysema (ICD10-J43.9).   06-04-16: 2-d echo:   - Left ventricle: The cavity size was normal. Wall thickness was increased in a pattern of mild LVH. Systolic function was normal. The estimated ejection fraction was in the range of 60% to 65%. Wall motion was normal; there were no regional wall motion abnormalities. Features are consistent with a pseudonormal left ventricular filling pattern, with concomitant abnormal relaxation and increased filling pressure (grade 2 diastolic dysfunction). - Right ventricle: Systolic function was mildly reduced.   LABS REVIEWED:   05-30-16; wbc 6.1; hgb 13.6; hct 43.6; mcv 89.2 plt 132; glucose 100; bun 26; creat 1.01; k+ 3.7; na++ 141; liver normal albumin 3.9; hgb a1 6.5; chol 160; ldl 101; trig 76; hdl 44 05-31-16: tsh 4.816 06-01-16: free T4: 1.13;  RPR: nr; HIV; nr; Vit B12: 567; Vit B1: 91.3 06-04-16: wbc 5.6; hgb 12.8; hct 39.8; mcv 85.8; plt 148; glucose 155; bun 20; creat 1.11; k+ 4.0; na++ 138; liver normal albumin 3.2; INR 1.97  06-06-16: wbc 6.0; hgb 13.7; hct 43.6; mcv 88.4; plt 160; glucose 129; bun 19.2; creat 0.99; k+ 4.0; na++ 142; liver normal albumin 4.1  06-19-16: glucose 135; bun 16.1; creat 1.09; k+ 4.5; na++ 140 ca 9.5; tsh 0.18  07-21-16: INR 4.2 on 11 mg coumadin 07-23-16: INR 3.8 11 mg coumadin on hold  07-24-16:  INR 2.4  07-29-16: INR 4. 9 on 11 mg coumadin; tsh 0.10  07-31-16: INR 3.0   Review of Systems  Constitutional: Negative for malaise/fatigue.  Respiratory: Negative for cough and shortness of breath.   Cardiovascular: Negative for chest pain, palpitations and leg swelling.  Gastrointestinal: Negative for abdominal pain, constipation and heartburn.  Musculoskeletal: Positive for back pain. Negative for joint pain and myalgias.       Has chronic back pain   Skin: Negative.   Neurological: Negative for dizziness.  Psychiatric/Behavioral: The patient is not nervous/anxious.     Physical Exam  Constitutional: No distress.  Overweight   Eyes: Conjunctivae are normal.  Right hemianopia   Neck: Neck supple. No JVD present. No thyromegaly present.  Cardiovascular: Normal rate, regular rhythm and intact distal pulses.   Respiratory: Effort normal and breath sounds normal. No respiratory distress. She has no wheezes.  GI: Soft. Bowel sounds are normal. She exhibits no distension. There is no tenderness.  Musculoskeletal: She exhibits no edema.  Able to move all extremities  Is ambulatory   Lymphadenopathy:    She has no cervical adenopathy.  Neurological: She is alert.  Skin: Skin is warm and dry. She is not diaphoretic.  Psychiatric: She has a normal mood and affect.    ASSESSMENT/ PLAN:  1.  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 CVA  3. Anticoagulation management  For her INR 3.0; will continue coumadin at 10 mg nightly and will check INR on Tuesday and will monitor her status.     Synthia Innocent NP [facility]  Contact [phone] Monday through Friday 8am- 5pm  After hours call [phone]

## 2016-08-08 LAB — TSH: TSH: 0.62 (ref 0.41–5.90)

## 2016-08-12 ENCOUNTER — Non-Acute Institutional Stay (SKILLED_NURSING_FACILITY): Payer: Medicare Other | Admitting: Adult Health

## 2016-08-12 ENCOUNTER — Encounter: Payer: Self-pay | Admitting: Adult Health

## 2016-08-12 DIAGNOSIS — I358 Other nonrheumatic aortic valve disorders: Secondary | ICD-10-CM | POA: Diagnosis not present

## 2016-08-12 DIAGNOSIS — I639 Cerebral infarction, unspecified: Secondary | ICD-10-CM | POA: Diagnosis not present

## 2016-08-12 DIAGNOSIS — G300 Alzheimer's disease with early onset: Secondary | ICD-10-CM

## 2016-08-12 DIAGNOSIS — J410 Simple chronic bronchitis: Secondary | ICD-10-CM

## 2016-08-12 DIAGNOSIS — I10 Essential (primary) hypertension: Secondary | ICD-10-CM

## 2016-08-12 DIAGNOSIS — F0280 Dementia in other diseases classified elsewhere without behavioral disturbance: Secondary | ICD-10-CM | POA: Diagnosis not present

## 2016-08-12 LAB — POCT INR: INR: 4.3 — AB (ref 0.9–1.1)

## 2016-08-12 LAB — PROTIME-INR: Protime: 39.7 — AB (ref 10.0–13.8)

## 2016-08-12 NOTE — Progress Notes (Signed)
Location:   [facility] Room Number: 124 A Place of Service:  SNF (31)    CODE STATUS:  Full Code  No Known Allergies  Chief Complaint  Patient presents with  . Discharge Note    Discharge to Home    HPI:  She is being discharged to home after a prolonged admission to this facility for short term rehab from a cva. She will require 24 hour supervision. Her family is able to provide for her needs. She will need home health for RN. She will not require any dme. She will need her prescriptions to be written and will need to follow up with her primary care provider  Her INR today is 4.3 she has been taking 11 mg coumadin daily    Past Medical History:  Diagnosis Date  . COPD (chronic obstructive pulmonary disease) (HCC)    on CT scan 06/28/2015  . Dementia   . Diabetes mellitus   . Hypertension   . Hypothyroidism   . Idiopathic acute pancreatitis 09/16/2011  . 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 Memory loss   . Pancreatitis 2010  . Tobacco abuse   . Trigger finger     Past Surgical History:  Procedure Laterality Date  . BACK SURGERY    . CHOLECYSTECTOMY  2003  . CYST REMOVAL HAND Left 12/11/2014   Procedure: LEFT RING FINGER TENDON SHEATH CYST EXCISION;  Surgeon: Mack Hook, MD;  Location: [facility];  Service: Orthopedics;  Laterality: Left;  . SPINE SURGERY  2006   Dr. Trey Sailors - L3-L4, L4-L5 laminotomy and foraminotomy  . TEE WITHOUT CARDIOVERSION N/A 07/03/2015   Procedure: TRANSESOPHAGEAL ECHOCARDIOGRAM (TEE);  Surgeon: Quintella Reichert, MD;  Location: [facility] ENDOSCOPY;  Service: Cardiovascular;  Laterality: N/A;    Social History   Social History  . Marital status: Single    Spouse name: N/A  . Number of children: N/A  . Years of education: N/A   Occupational History  . Not on file.   Social History Main Topics  . Smoking status: Former Smoker    Packs/day: 0.50    Types: Cigarettes    Start date: 07/01/2015  . Smokeless  tobacco: Never Used     Comment: still in stress. looking for job.   . Alcohol use No  . Drug use: No  . Sexual activity: Not on file   Other Topics Concern  . Not on file   Social History Narrative   Lives with boyfriend.  No children.  Retired from [employer].  Education: 2 years of college.   Family History  Problem Relation Age of Onset  . Alcohol abuse Father   . Cancer Mother     VITAL SIGNS BP 113/64   Pulse 62   Temp 97.6 F (36.4 C)   Resp 18   Ht 5\' 2"  (1.575 m)   Wt 217 lb 9.6 oz (98.7 kg)   SpO2 98%   BMI 39.80 kg/m   Patient's Medications  New Prescriptions   No medications on file  Previous Medications   AMLODIPINE (NORVASC) 10 MG TABLET    Take 10 mg by mouth daily.    ATORVASTATIN (LIPITOR) 40 MG TABLET    Take 40 mg by mouth daily.   DONEPEZIL (ARICEPT) 10 MG TABLET    Take 1 tablet (10 mg total) by mouth at bedtime.   LEVOTHYROXINE (SYNTHROID, LEVOTHROID) 25 MCG TABLET    Take 25 mcg by mouth daily before  breakfast.    LISINOPRIL (PRINIVIL,ZESTRIL) 20 MG TABLET    TAKE 1 TABLET (20 MG TOTAL) BY MOUTH DAILY.   METFORMIN (GLUCOPHAGE) 500 MG TABLET    TAKE ONE TABLET BY MOUTH ONCE DAILY   MIRTAZAPINE (REMERON) 15 MG TABLET    Take 15 mg by mouth at bedtime.    SERTRALINE (ZOLOFT) 100 MG TABLET    Take 1 tablet (100 mg total) by mouth 2 (two) times daily.   WARFARIN (COUMADIN) 10 MG TABLET    Take 11 mg by mouth every evening.   Modified Medications   No medications on file  Discontinued Medications   No medications on file     SIGNIFICANT DIAGNOSTIC EXAMS  Location:   [facility] Room Number: 124 A Place of Service:  SNF (31)   CODE STATUS:  Full Code  No Known Allergies  Chief Complaint  Patient presents with  . Discharge Note    Discharge to Home    HPI:  Her INR today is 3.0; her coumadin has been held for 2 days. The goal of her INR is 2-3. She is tolerating coumadin without any difficulties. There are no nursing  concerns at this time.    Past Medical History:  Diagnosis Date  . COPD (chronic obstructive pulmonary disease) (HCC)    on CT scan 06/28/2015  . Dementia   . Diabetes mellitus   . Hypertension   . Hypothyroidism   . Idiopathic acute pancreatitis 09/16/2011  . 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 Memory loss   . Pancreatitis 2010  . Tobacco abuse   . Trigger finger     Past Surgical History:  Procedure Laterality Date  . BACK SURGERY    . CHOLECYSTECTOMY  2003  . CYST REMOVAL HAND Left 12/11/2014   Procedure: LEFT RING FINGER TENDON SHEATH CYST EXCISION;  Surgeon: Mack Hook, MD;  Location: [facility];  Service: Orthopedics;  Laterality: Left;  . SPINE SURGERY  2006   Dr. Trey Sailors - L3-L4, L4-L5 laminotomy and foraminotomy  . TEE WITHOUT CARDIOVERSION N/A 07/03/2015   Procedure: TRANSESOPHAGEAL ECHOCARDIOGRAM (TEE);  Surgeon: Quintella Reichert, MD;  Location: [facility] ENDOSCOPY;  Service: Cardiovascular;  Laterality: N/A;    Social History   Social History  . Marital status: Single    Spouse name: N/A  . Number of children: N/A  . Years of education: N/A   Occupational History  . Not on file.   Social History Main Topics  . Smoking status: Former Smoker    Packs/day: 0.50    Types: Cigarettes    Start date: 07/01/2015  . Smokeless tobacco: Never Used     Comment: still in stress. looking for job.   . Alcohol use No  . Drug use: No  . Sexual activity: Not on file   Other Topics Concern  . Not on file   Social History Narrative   Lives with boyfriend.  No children.  Retired from [employer].  Education: 2 years of college.   Family History  Problem Relation Age of Onset  . Alcohol abuse Father   . Cancer Mother       VITAL SIGNS BP 113/64   Pulse 62   Temp 97.6 F (36.4 C)   Resp 18   Ht 5\' 2"  (1.575 m)   Wt 217 lb 9.6 oz (98.7 kg)   SpO2 98%   BMI 39.80 kg/m   Patient's Medications  New Prescriptions   No  medications on file    Previous Medications   AMLODIPINE (NORVASC) 10 MG TABLET    Take 10 mg by mouth daily.    ATORVASTATIN (LIPITOR) 40 MG TABLET    Take 40 mg by mouth daily.   DONEPEZIL (ARICEPT) 10 MG TABLET    Take 1 tablet (10 mg total) by mouth at bedtime.   LEVOTHYROXINE (SYNTHROID, LEVOTHROID) 25 MCG TABLET    Take 25 mcg by mouth daily before breakfast.    LISINOPRIL (PRINIVIL,ZESTRIL) 20 MG TABLET    TAKE 1 TABLET (20 MG TOTAL) BY MOUTH DAILY.   METFORMIN (GLUCOPHAGE) 500 MG TABLET    TAKE ONE TABLET BY MOUTH ONCE DAILY   MIRTAZAPINE (REMERON) 15 MG TABLET    Take 15 mg by mouth at bedtime.    SERTRALINE (ZOLOFT) 100 MG TABLET    Take 1 tablet (100 mg total) by mouth 2 (two) times daily.   WARFARIN (COUMADIN) 10 MG TABLET    Take 11 mg by mouth every evening.   Modified Medications   No medications on file  Discontinued Medications   No medications on file     SIGNIFICANT DIAGNOSTIC EXAMS   05-30-16: ct of head: 1. Left occipital infarct explaining the patient's right visual symptoms. Based on the low-attenuation an lack of mass effect, the infarct is not thought to be acute. Recommend clinical correlation with timing of patient's symptom onset. 2. No other acute abnormalities identified.  05-31-16: chest x-ray: Peribronchial thickening noted. Lungs otherwise grossly clear.  05-31-16: mri of brain: Mild motion degraded examination. Old PCA territory infarcts with superimposed tiny acute LEFT occipital infarct versus artifact. Tiny subacute RIGHT occipital lobe infarct versus motion artifact. Multiple old bilateral small cerebellar infarcts though, though some are new from June 28, 2015. Mild chronic small vessel ischemic disease. Old LEFT basal ganglia lacunar infarct.  05-31-16: ct angio of head and neck: 1. Left P 2/3 segment occlusion correlating with the interval left occipital infarct. 2. Stable anterior intracranial circulation including moderate left cavernous stenosis. 3. Mild atherosclerosis  in the neck without stenosis. Bilateral ICA tortuosity. 4. Aortic Atherosclerosis (ICD10-I70.0) and Emphysema (ICD10-J43.9).   06-04-16: 2-d echo:   - Left ventricle: The cavity size was normal. Wall thickness was increased in a pattern of mild LVH. Systolic function was normal. The estimated ejection fraction was in the range of 60% to 65%. Wall motion was normal; there were no regional wall motion abnormalities. Features are consistent with a pseudonormal left ventricular filling pattern, with concomitant abnormal relaxation and increased filling pressure (grade 2 diastolic dysfunction). - Right ventricle: Systolic function was mildly reduced.   LABS REVIEWED:   05-30-16; wbc 6.1; hgb 13.6; hct 43.6; mcv 89.2 plt 132; glucose 100; bun 26; creat 1.01; k+ 3.7; na++ 141; liver normal albumin 3.9; hgb a1 6.5; chol 160; ldl 101; trig 76; hdl 44 05-31-16: tsh 4.816 06-01-16: free T4: 1.13;  RPR: nr; HIV; nr; Vit B12: 567; Vit B1: 91.3 06-04-16: wbc 5.6; hgb 12.8; hct 39.8; mcv 85.8; plt 148; glucose 155; bun 20; creat 1.11; k+ 4.0; na++ 138; liver normal albumin 3.2; INR 1.97  06-06-16: wbc 6.0; hgb 13.7; hct 43.6; mcv 88.4; plt 160; glucose 129; bun 19.2; creat 0.99; k+ 4.0; na++ 142; liver normal albumin 4.1  06-19-16: glucose 135; bun 16.1; creat 1.09; k+ 4.5; na++ 140 ca 9.5; tsh 0.18  07-21-16: INR 4.2 on 11 mg coumadin 07-23-16: INR 3.8 11 mg coumadin on hold  07-24-16: INR 2.4  07-29-16:  INR 4. 9 on 11 mg coumadin; tsh 0.10  07-31-16: INR 3.0  08-12-16: INR 4.3 on 11 mg coumadin    Review of Systems  Constitutional: Negative for malaise/fatigue.  Respiratory: Negative for cough and shortness of breath.   Cardiovascular: Negative for chest pain, palpitations and leg swelling.  Gastrointestinal: Negative for abdominal pain, constipation and heartburn.  Musculoskeletal: Positive for back pain. Negative for joint pain and myalgias.       Has chronic back pain   Skin: Negative.   Neurological: Negative for  dizziness.  Psychiatric/Behavioral: The patient is not nervous/anxious.     Physical Exam  Constitutional: No distress.  Overweight   Eyes: Conjunctivae are normal.  Right hemianopia   Neck: Neck supple. No JVD present. No thyromegaly present.  Cardiovascular: Normal rate, regular rhythm and intact distal pulses.   Respiratory: Effort normal and breath sounds normal. No respiratory distress. She has no wheezes.  GI: Soft. Bowel sounds are normal. She exhibits no distension. There is no tenderness.  Musculoskeletal: She exhibits no edema.  Able to move all extremities  Is ambulatory   Lymphadenopathy:    She has no cervical adenopathy.  Neurological: She is alert.  Skin: Skin is warm and dry. She is not diaphoretic.  Psychiatric: She has a normal mood and affect.    ASSESSMENT/ PLAN:  Patient is being discharged with the following home health services:  RN to evaluate and treat as indicated for medication management and INR monitory INR due 08-19-16.   Patient is being discharged with the following durable medical equipment:  None required   Patient has been advised to f/u with their PCP in 1-2 weeks to bring them up to date on their rehab stay.  Social services at facility was responsible for arranging this appointment.  Pt was provided with a 30 day supply of prescriptions for medications and refills must be obtained from their PCP.  For controlled substances, a more limited supply may be provided adequate until PCP appointment only.   Time spent with patient 40  minutes >50% time spent counseling; reviewing medical record; tests; labs; and developing future plan of care   Synthia InnocentDeborah Karee Forge NP [facility]  Contact [phone] Monday through Friday 8am- 5pm  After hours call [phone]

## 2016-08-13 LAB — PROTIME-INR: PROTIME: 43.5 — AB (ref 10.0–13.8)

## 2016-08-13 LAB — POCT INR: INR: 4.8 — AB (ref 0.9–1.1)

## 2016-09-12 ENCOUNTER — Emergency Department (HOSPITAL_COMMUNITY): Payer: Medicare Other

## 2016-09-12 ENCOUNTER — Observation Stay (HOSPITAL_COMMUNITY)
Admission: EM | Admit: 2016-09-12 | Discharge: 2016-09-26 | Disposition: A | Discharge status: Skilled Nursing Facility | Payer: Medicare Other | Attending: Internal Medicine | Admitting: Internal Medicine

## 2016-09-12 ENCOUNTER — Encounter (HOSPITAL_COMMUNITY): Payer: Self-pay

## 2016-09-12 DIAGNOSIS — I10 Essential (primary) hypertension: Secondary | ICD-10-CM | POA: Diagnosis not present

## 2016-09-12 DIAGNOSIS — D696 Thrombocytopenia, unspecified: Secondary | ICD-10-CM | POA: Insufficient documentation

## 2016-09-12 DIAGNOSIS — Z79899 Other long term (current) drug therapy: Secondary | ICD-10-CM | POA: Diagnosis not present

## 2016-09-12 DIAGNOSIS — F0281 Dementia in other diseases classified elsewhere with behavioral disturbance: Secondary | ICD-10-CM | POA: Diagnosis not present

## 2016-09-12 DIAGNOSIS — Z87891 Personal history of nicotine dependence: Secondary | ICD-10-CM | POA: Insufficient documentation

## 2016-09-12 DIAGNOSIS — Z7901 Long term (current) use of anticoagulants: Secondary | ICD-10-CM | POA: Diagnosis not present

## 2016-09-12 DIAGNOSIS — E034 Atrophy of thyroid (acquired): Secondary | ICD-10-CM | POA: Diagnosis present

## 2016-09-12 DIAGNOSIS — R739 Hyperglycemia, unspecified: Secondary | ICD-10-CM

## 2016-09-12 DIAGNOSIS — E1165 Type 2 diabetes mellitus with hyperglycemia: Secondary | ICD-10-CM | POA: Insufficient documentation

## 2016-09-12 DIAGNOSIS — F01518 Vascular dementia, unspecified severity, with other behavioral disturbance: Secondary | ICD-10-CM | POA: Diagnosis present

## 2016-09-12 DIAGNOSIS — E785 Hyperlipidemia, unspecified: Secondary | ICD-10-CM | POA: Diagnosis present

## 2016-09-12 DIAGNOSIS — Z8673 Personal history of transient ischemic attack (TIA), and cerebral infarction without residual deficits: Secondary | ICD-10-CM | POA: Insufficient documentation

## 2016-09-12 DIAGNOSIS — A150 Tuberculosis of lung: Secondary | ICD-10-CM

## 2016-09-12 DIAGNOSIS — Z7984 Long term (current) use of oral hypoglycemic drugs: Secondary | ICD-10-CM | POA: Insufficient documentation

## 2016-09-12 DIAGNOSIS — J449 Chronic obstructive pulmonary disease, unspecified: Secondary | ICD-10-CM | POA: Diagnosis not present

## 2016-09-12 DIAGNOSIS — F0151 Vascular dementia with behavioral disturbance: Secondary | ICD-10-CM | POA: Diagnosis present

## 2016-09-12 DIAGNOSIS — G300 Alzheimer's disease with early onset: Secondary | ICD-10-CM | POA: Insufficient documentation

## 2016-09-12 DIAGNOSIS — E860 Dehydration: Secondary | ICD-10-CM | POA: Insufficient documentation

## 2016-09-12 DIAGNOSIS — F418 Other specified anxiety disorders: Secondary | ICD-10-CM | POA: Diagnosis not present

## 2016-09-12 DIAGNOSIS — Z9119 Patient's noncompliance with other medical treatment and regimen: Secondary | ICD-10-CM | POA: Insufficient documentation

## 2016-09-12 DIAGNOSIS — N179 Acute kidney failure, unspecified: Secondary | ICD-10-CM | POA: Diagnosis not present

## 2016-09-12 DIAGNOSIS — E876 Hypokalemia: Secondary | ICD-10-CM | POA: Insufficient documentation

## 2016-09-12 DIAGNOSIS — E440 Moderate protein-calorie malnutrition: Secondary | ICD-10-CM | POA: Diagnosis not present

## 2016-09-12 DIAGNOSIS — E1110 Type 2 diabetes mellitus with ketoacidosis without coma: Secondary | ICD-10-CM | POA: Diagnosis not present

## 2016-09-12 DIAGNOSIS — E872 Acidosis: Secondary | ICD-10-CM | POA: Insufficient documentation

## 2016-09-12 LAB — COMPREHENSIVE METABOLIC PANEL
ALBUMIN: 4.3 g/dL (ref 3.5–5.0)
ALT: 32 U/L (ref 14–54)
ANION GAP: 17 — AB (ref 5–15)
AST: 57 U/L — AB (ref 15–41)
Alkaline Phosphatase: 86 U/L (ref 38–126)
BILIRUBIN TOTAL: 0.5 mg/dL (ref 0.3–1.2)
BUN: 30 mg/dL — AB (ref 6–20)
CHLORIDE: 104 mmol/L (ref 101–111)
CO2: 18 mmol/L — AB (ref 22–32)
Calcium: 9.9 mg/dL (ref 8.9–10.3)
Creatinine, Ser: 2.69 mg/dL — ABNORMAL HIGH (ref 0.44–1.00)
GFR calc Af Amer: 20 mL/min — ABNORMAL LOW (ref >60)
GFR calc non Af Amer: 17 mL/min — ABNORMAL LOW (ref >60)
GLUCOSE: 225 mg/dL — AB (ref 65–99)
POTASSIUM: 4.5 mmol/L (ref 3.5–5.1)
SODIUM: 139 mmol/L (ref 135–145)
TOTAL PROTEIN: 8.3 g/dL — AB (ref 6.5–8.1)

## 2016-09-12 LAB — PROTIME-INR
INR: 2.24
Prothrombin Time: 25.2 seconds — ABNORMAL HIGH (ref 11.4–15.2)

## 2016-09-12 LAB — CBC WITH DIFFERENTIAL/PLATELET
BASOS ABS: 0 10*3/uL (ref 0.0–0.1)
BASOS PCT: 0 %
EOS ABS: 0 10*3/uL (ref 0.0–0.7)
EOS PCT: 0 %
HEMATOCRIT: 41.2 % (ref 36.0–46.0)
Hemoglobin: 13.5 g/dL (ref 12.0–15.0)
Lymphocytes Relative: 17 %
Lymphs Abs: 1.4 10*3/uL (ref 0.7–4.0)
MCH: 27.8 pg (ref 26.0–34.0)
MCHC: 32.8 g/dL (ref 30.0–36.0)
MCV: 84.8 fL (ref 78.0–100.0)
Monocytes Absolute: 0.6 10*3/uL (ref 0.1–1.0)
Monocytes Relative: 7 %
NEUTROS ABS: 6 10*3/uL (ref 1.7–7.7)
Neutrophils Relative %: 76 %
PLATELETS: 176 10*3/uL (ref 150–400)
RBC: 4.86 MIL/uL (ref 3.87–5.11)
RDW: 16.3 % — AB (ref 11.5–15.5)
WBC: 8 10*3/uL (ref 4.0–10.5)

## 2016-09-12 LAB — APTT: APTT: 40 s — AB (ref 24–36)

## 2016-09-12 MED ORDER — WARFARIN - PHARMACIST DOSING INPATIENT
Freq: Every day | Status: DC
  Administered 2016-09-13 – 2016-09-24 (×5)

## 2016-09-12 MED ORDER — ALBUTEROL SULFATE (2.5 MG/3ML) 0.083% IN NEBU: 2.5000 mg | INHALATION_SOLUTION | RESPIRATORY_TRACT | Status: DC | PRN

## 2016-09-12 MED ORDER — SODIUM CHLORIDE 0.9 % IV SOLN: INTRAVENOUS | Status: DC

## 2016-09-12 MED ORDER — DEXTROSE-NACL 5-0.45 % IV SOLN: INTRAVENOUS | Status: DC

## 2016-09-12 MED ORDER — LEVOTHYROXINE SODIUM 50 MCG PO TABS
75.0000 ug | ORAL_TABLET | Freq: Every day | ORAL | Status: DC
  Administered 2016-09-13 – 2016-09-26 (×14): 75 ug via ORAL
  Filled 2016-09-12 (×14): qty 3

## 2016-09-12 MED ORDER — MIRTAZAPINE 30 MG PO TABS
15.0000 mg | ORAL_TABLET | Freq: Every day | ORAL | Status: DC
  Administered 2016-09-13: 15 mg via ORAL
  Filled 2016-09-12: qty 0.5

## 2016-09-12 MED ORDER — HEPARIN SODIUM (PORCINE) 5000 UNIT/ML IJ SOLN: 5000.0000 [IU] | Freq: Three times a day (TID) | INTRAMUSCULAR | Status: DC

## 2016-09-12 MED ORDER — ACETAMINOPHEN 650 MG RE SUPP: 650.0000 mg | Freq: Four times a day (QID) | RECTAL | Status: DC | PRN

## 2016-09-12 MED ORDER — SODIUM CHLORIDE 0.9 % IV SOLN
INTRAVENOUS | Status: DC
  Filled 2016-09-12: qty 1

## 2016-09-12 MED ORDER — ONDANSETRON HCL 4 MG/2ML IJ SOLN
4.0000 mg | Freq: Four times a day (QID) | INTRAMUSCULAR | Status: DC | PRN
Start: 2016-09-12 — End: 2016-09-26

## 2016-09-12 MED ORDER — ONDANSETRON HCL 4 MG PO TABS: 4.0000 mg | ORAL_TABLET | Freq: Four times a day (QID) | ORAL | Status: DC | PRN

## 2016-09-12 MED ORDER — ACETAMINOPHEN 325 MG PO TABS: 650.0000 mg | ORAL_TABLET | Freq: Four times a day (QID) | ORAL | Status: DC | PRN

## 2016-09-12 MED ORDER — WARFARIN SODIUM 5 MG PO TABS
10.0000 mg | ORAL_TABLET | Freq: Every day | ORAL | Status: DC
  Administered 2016-09-13 (×2): 10 mg via ORAL
  Filled 2016-09-12: qty 1
  Filled 2016-09-12: qty 2

## 2016-09-12 MED ORDER — POTASSIUM CHLORIDE 10 MEQ/100ML IV SOLN
10.0000 meq | INTRAVENOUS | Status: DC
  Filled 2016-09-12: qty 100

## 2016-09-12 MED ORDER — DONEPEZIL HCL 5 MG PO TABS
10.0000 mg | ORAL_TABLET | Freq: Every day | ORAL | Status: DC
  Filled 2016-09-12: qty 2

## 2016-09-12 MED ORDER — SERTRALINE HCL 50 MG PO TABS
100.0000 mg | ORAL_TABLET | Freq: Two times a day (BID) | ORAL | Status: DC
  Administered 2016-09-13 – 2016-09-26 (×27): 100 mg via ORAL
  Filled 2016-09-12 (×3): qty 2
  Filled 2016-09-12: qty 4
  Filled 2016-09-12 (×24): qty 2

## 2016-09-12 MED ORDER — ATORVASTATIN CALCIUM 40 MG PO TABS
40.0000 mg | ORAL_TABLET | Freq: Every day | ORAL | Status: DC
  Administered 2016-09-13 – 2016-09-26 (×14): 40 mg via ORAL
  Filled 2016-09-12 (×15): qty 1

## 2016-09-12 MED ORDER — SODIUM CHLORIDE 0.9 % IV BOLUS (SEPSIS)
1000.0000 mL | Freq: Once | INTRAVENOUS | Status: AC
  Administered 2016-09-12: 1000 mL via INTRAVENOUS

## 2016-09-12 MED ORDER — LEVOTHYROXINE SODIUM 25 MCG PO TABS: 25.0000 ug | ORAL_TABLET | Freq: Every day | ORAL | Status: DC

## 2016-09-12 NOTE — ED Triage Notes (Addendum)
Patient with history of dementia, was dropped of at [facility] to "get my coumadin levels checked." Patient is poor historian. States she usually gets her coumadin levels checked at Dr Kandyce Rud office, and is unsure why she was told to come to [facility] to get them checked. Per patient record, patient has a history of a CVA and is on warfarin. Per patient's record, patient was discharged from SNF in 08/12/16 and has not had her warfarin levels checked since.

## 2016-09-12 NOTE — Progress Notes (Signed)
CSW called pt's cousin Daryll Drown at ph :[phone] to update her that pt would be admitted and pt's cousin provided the CSW with the pt's Healthcare "Proxy's ", or power-of-attorney, per pt's cousin the proxy is Minerva Areola [phone] and Grandville Silos at [phone]. Per pt's cousin pt may "not remember that these are her medical proxy's.  Please reconsult if future social work needs arise.    Dorothe Pea. Cheyene Hamric, Francesco Sor, CSI Clinical Social Worker Ph: [phone]
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
## 2016-09-12 NOTE — ED Notes (Signed)
Bed: ES92 Expected date:  Expected time:  Means of arrival:  Comments: [place]

## 2016-09-12 NOTE — ED Provider Notes (Signed)
[facility]-EMERGENCY DEPT Provider Note   CSN: [id number] Arrival date & time: 09/12/16  1812     History   Chief Complaint Chief Complaint  Patient presents with  . Labs Only    HPI   Blood pressure 101/68, pulse 86, temperature 97.9 F (36.6 C), temperature source Oral, resp. rate 18, weight 98.4 kg (217 lb), SpO2 100 %.  [name] is a 66 y.o. female with past medical history significant for diabetes, dementia, CVA, presenting to the ED tonight with confusion. Patient states she does not know why she is here how she got to the emergency department, confabulating that she possibly needs her INR checked. She cannot tell me her Coumadin dosage or why she takes Coumadin. When I ask her how frequently she has her INR checked she states daily. Level V caveat secondary to dementia.   Past Medical History:  Diagnosis Date  . COPD (chronic obstructive pulmonary disease) (HCC)    on CT scan 06/28/2015  . Dementia   . Diabetes mellitus   . Hypertension   . Hypothyroidism   . Idiopathic acute pancreatitis 09/16/2011  . 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 Memory loss   . Pancreatitis 2010  . Tobacco abuse   . Trigger finger     Patient Active Problem List   Diagnosis Date Noted  . Hypothyroidism due to acquired atrophy of thyroid 06/06/2016  . Dyslipidemia associated with type 2 diabetes mellitus (HCC) 06/06/2016  . Depression with anxiety 06/06/2016  . Thrombocytopenia (HCC)   . Sundowning   . Non-compliance   . Chronic obstructive pulmonary disease (HCC)   . Benign essential HTN   . History of CVA (cerebrovascular accident)   . Dementia with behavioral disturbance   . Occipital stroke (HCC)   . 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 Chronic anticoagulation   . Vertigo   . AKI (acute kidney injury) (HCC) 06/28/2015  . Protein-calorie malnutrition, moderate (HCC) 06/28/2015  . Acute CVA (cerebrovascular accident) (HCC) 06/28/2015  . Hypokalemia   . Cerebellar stroke  (HCC)   . Emesis   . Anxiety 06/27/2015  . Dizziness 06/27/2015  . Tobacco abuse   . Early onset Alzheimer's dementia without behavioral disturbance 11/03/2014  . Memory deficits 07/03/2014  . Trigger finger, acquired 09/13/2012  . Pain in both wrists 05/12/2012  . Non-proliferative diabetic retinopathy, both eyes (HCC) 05/07/2012  . Colon cancer screening 12/23/2011  . Unintentional weight loss 12/22/2011  . Memory problem 10/31/2011  . Idiopathic acute pancreatitis 10/29/2011  . Major depressive disorder 09/10/2011  . Hypertension 09/10/2011  . Chronic back pain 09/10/2011  . Hyperlipidemia 09/10/2011  . Type II diabetes mellitus with neurological manifestations (HCC) 09/10/2011    Past Surgical History:  Procedure Laterality Date  . BACK SURGERY    . CHOLECYSTECTOMY  2003  . CYST REMOVAL HAND Left 12/11/2014   Procedure: LEFT RING FINGER TENDON SHEATH CYST EXCISION;  Surgeon: Mack Hook, MD;  Location:  SURGERY CENTER;  Service: Orthopedics;  Laterality: Left;  . SPINE SURGERY  2006   Dr. Trey Sailors - L3-L4, L4-L5 laminotomy and foraminotomy  . TEE WITHOUT CARDIOVERSION N/A 07/03/2015   Procedure: TRANSESOPHAGEAL ECHOCARDIOGRAM (TEE);  Surgeon: Quintella Reichert, MD;  Location: [facility] ENDOSCOPY;  Service: Cardiovascular;  Laterality: N/A;    OB History    No data available       Home Medications    Prior to Admission medications   Medication Sig Start Date End Date  Taking? Authorizing Provider  amLODipine (NORVASC) 10 MG tablet Take 10 mg by mouth daily.  05/01/16   [provider]  atorvastatin (LIPITOR) 40 MG tablet Take 40 mg by mouth daily.    [provider]  donepezil (ARICEPT) 10 MG tablet Take 1 tablet (10 mg total) by mouth at bedtime. 05/05/16   Drema Dallas, DO  levothyroxine (SYNTHROID, LEVOTHROID) 25 MCG tablet Take 25 mcg by mouth daily before breakfast.     [provider]  lisinopril (PRINIVIL,ZESTRIL) 20 MG tablet TAKE 1  TABLET (20 MG TOTAL) BY MOUTH DAILY. 01/10/14   Myra Rude, MD  metFORMIN (GLUCOPHAGE) 500 MG tablet TAKE ONE TABLET BY MOUTH ONCE DAILY 04/03/14   Myra Rude, MD  mirtazapine (REMERON) 15 MG tablet Take 15 mg by mouth at bedtime.     [provider]  sertraline (ZOLOFT) 100 MG tablet Take 1 tablet (100 mg total) by mouth 2 (two) times daily. 11/21/13   Myra Rude, MD  warfarin (COUMADIN) 10 MG tablet Take 11 mg by mouth every evening.     [provider]    Family History Family History  Problem Relation Age of Onset  . Alcohol abuse Father   . Cancer Mother     Social History Social History  Substance Use Topics  . Smoking status: Former Smoker    Packs/day: 0.50    Types: Cigarettes    Start date: 07/01/2015  . Smokeless tobacco: Never Used     Comment: still in stress. looking for job.   . Alcohol use No     Allergies   Patient has no known allergies.   Review of Systems Review of Systems  Unable to perform ROS: Dementia     Physical Exam Updated Vital Signs BP 101/68 (BP Location: Right Arm)   Pulse 86   Temp 97.9 F (36.6 C) (Oral)   Resp 18   Wt 98.4 kg (217 lb)   SpO2 100%   BMI 39.69 kg/m   Physical Exam  Constitutional: She is oriented to person, place, and time. She appears well-developed and well-nourished. No distress.  HENT:  Head: Normocephalic and atraumatic.  Mouth/Throat: Oropharynx is clear and moist.  Eyes: Pupils are equal, round, and reactive to light. Conjunctivae and EOM are normal.  Neck: Normal range of motion.  Cardiovascular: Normal rate, regular rhythm and intact distal pulses.   Pulmonary/Chest: Effort normal and breath sounds normal.  Abdominal: Soft. There is no tenderness.  Musculoskeletal: Normal range of motion.  Neurological: She is alert and oriented to person, place, and time.  Follows commands, Clear, goal oriented speech, Strength is 5 out of 5x4 extremities, patient ambulates with  a coordinated in nonantalgic gait. Sensation is grossly intact.  Confused, confabulating, cooperative  Skin: She is not diaphoretic.  Psychiatric: She has a normal mood and affect.  Nursing note and vitals reviewed.    ED Treatments / Results  Labs (all labs ordered are listed, but only abnormal results are displayed) Labs Reviewed  CBC WITH DIFFERENTIAL/PLATELET - Abnormal; Notable for the following:       Result Value   RDW 16.3 (*)    All other components within normal limits  COMPREHENSIVE METABOLIC PANEL - Abnormal; Notable for the following:    CO2 18 (*)    Glucose, Bld 225 (*)    BUN 30 (*)    Creatinine, Ser 2.69 (*)    Total Protein 8.3 (*)    AST 57 (*)  GFR calc non Af Amer 17 (*)    GFR calc Af Amer 20 (*)    Anion gap 17 (*)    All other components within normal limits  PROTIME-INR - Abnormal; Notable for the following:    Prothrombin Time 25.2 (*)    All other components within normal limits  APTT - Abnormal; Notable for the following:    aPTT 40 (*)    All other components within normal limits  URINALYSIS, ROUTINE W REFLEX MICROSCOPIC    EKG  EKG Interpretation None       Radiology Ct Head Wo Contrast  Result Date: 09/12/2016 CLINICAL DATA:  66 y/o  F; confusion. EXAM: CT HEAD WITHOUT CONTRAST TECHNIQUE: Contiguous axial images were obtained from the base of the skull through the vertex without intravenous contrast. COMPARISON:  05/31/2016 MRI head.  05/31/2016 CT head. FINDINGS: Brain: Stable chronic left occipital lobe infarction extending into left splenium of corpus callosum, small chronic lacunar infarcts within the bilateral cerebellar hemispheres, and mild parenchymal volume loss of the brain for age. No evidence for large acute vascular territory infarction, intracranial hemorrhage, or focal mass effect. No effacement of basilar cisterns. Vascular: Calcific atherosclerosis of carotid siphons. No hyperdense vessel. Skull: Normal. Negative for  fracture or focal lesion. Sinuses/Orbits: No acute finding. Other: None. IMPRESSION: 1. No acute intracranial abnormality identified. 2. Stable chronic left occipital/splenium infarct, chronic lacunar infarcts in cerebellum, and mild brain parenchymal volume loss. Electronically Signed   By: Mitzi Hansen M.D.   On: 09/12/2016 21:53    Procedures Procedures (including critical care time)  Medications Ordered in ED Medications - No data to display   Initial Impression / Assessment and Plan / ED Course  I have reviewed the triage vital signs and the nursing notes.  Pertinent labs & imaging results that were available during my care of the patient were reviewed by me and considered in my medical decision making (see chart for details).     Vitals:   09/12/16 1934 09/12/16 1947  BP: 101/68   Pulse: 86   Resp: 18   Temp: 97.9 F (36.6 C)   TempSrc: Oral   SpO2: 100%   Weight:  98.4 kg (217 lb)    Medications  sodium chloride 0.9 % bolus 1,000 mL (not administered)    [name] is 66 y.o. female presenting with Confusion, she has a history of dementia, neurologic exam is grossly nonfocal, patient is confabulating consistent with dementia. This patient states that she was living with her goddaughter's mother and left and went back to her own condominium because she was not allowed to have a TV and she was bored. This is a very unsafe situation for her she is self administering her Coumadin. It does not appear that she has any family in the area, family members appear to be in [place]. Social work and case management are consulted.  Social worker Christiane Ha states that in speaking to the family members in [place] they appear to be older than this patient and cannot readily come to [place] to assume care of her. Family members state that she does not have functioning water in her home. This explains why she is likely dehydrated with a acute kidney injury and an  elevation of her creatinine.  INR is therapeutic at 2.2. Head CT negative. Patient has an acute kidney injury with a creatinine of 2.69, hyperglycemia of 2.25, anion gap of 17 and a bicarbonate of 18. Patient is  bolused and will need admission for AKI.     Final Clinical Impressions(s) / ED Diagnoses   Final diagnoses:  AKI (acute kidney injury) (HCC)  Hyperglycemia    New Prescriptions New Prescriptions   No medications on file     Kaylyn Lim 09/12/16 2252    Cope Marte, Mardella Layman 09/12/16 2253    Mesner, Barbara Cower, MD 09/12/16 [phone]
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
## 2016-09-12 NOTE — ED Notes (Signed)
Asked patient if she could give urine specimen but patient states she is unable to go at this time.

## 2016-09-12 NOTE — ED Provider Notes (Deleted)
MSE was initiated and I personally evaluated the patient and placed orders (if any) at  8:21 PM on September 12, 2016.  The patient appears stable so that the remainder of the MSE may be completed by another provider.  Blood pressure 101/68, pulse 86, temperature 97.9 F (36.6 C), temperature source Oral, resp. rate 18, weight 98.4 kg (217 lb), SpO2 100 %.  [name] is a 66 y.o. female presenting to the emergency department confused, she is on here clear how she got here. She states that she may need her Coumadin level checked. She states she got a call from her PCP, Dr. Valentina Lucks stating that she needed to have her Coumadin level checked. She tells me that she has her Coumadin level checked every day. It appears that she lives by herself in a condominium. Patient is well kempt, grossly nonfocal neurologic exam. Confabulation consistent with baseline dementia. Concern for her safety in independent living. Blood work, urinalysis and head CT pending. Patient, and cooperative. She states that she was living with her goddaughter's mother but she did not living like living in a small room with no stimulation she was not allowed a TV. She states that she moved back into her condominium. She states that she does not have any family locally they are all in [place]. Social work, case management consulted.   Wynetta Emery, Cordelia Poche 09/12/16 2110
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
## 2016-09-12 NOTE — ED Triage Notes (Signed)
Patient was dropped off by friend of the familyClydia Llano [phone]

## 2016-09-13 ENCOUNTER — Inpatient Hospital Stay (HOSPITAL_COMMUNITY): Payer: Medicare Other

## 2016-09-13 DIAGNOSIS — Z7901 Long term (current) use of anticoagulants: Secondary | ICD-10-CM | POA: Diagnosis not present

## 2016-09-13 DIAGNOSIS — R739 Hyperglycemia, unspecified: Secondary | ICD-10-CM | POA: Diagnosis not present

## 2016-09-13 DIAGNOSIS — N179 Acute kidney failure, unspecified: Secondary | ICD-10-CM | POA: Diagnosis not present

## 2016-09-13 DIAGNOSIS — F0151 Vascular dementia with behavioral disturbance: Secondary | ICD-10-CM | POA: Diagnosis not present

## 2016-09-13 DIAGNOSIS — E1165 Type 2 diabetes mellitus with hyperglycemia: Secondary | ICD-10-CM | POA: Diagnosis present

## 2016-09-13 LAB — CBC
HEMATOCRIT: 39.6 % (ref 36.0–46.0)
Hemoglobin: 12.9 g/dL (ref 12.0–15.0)
MCH: 27.4 pg (ref 26.0–34.0)
MCHC: 32.6 g/dL (ref 30.0–36.0)
MCV: 84.3 fL (ref 78.0–100.0)
Platelets: 174 10*3/uL (ref 150–400)
RBC: 4.7 MIL/uL (ref 3.87–5.11)
RDW: 16.3 % — AB (ref 11.5–15.5)
WBC: 7.9 10*3/uL (ref 4.0–10.5)

## 2016-09-13 LAB — URINALYSIS, ROUTINE W REFLEX MICROSCOPIC
BILIRUBIN URINE: NEGATIVE
GLUCOSE, UA: NEGATIVE mg/dL
HGB URINE DIPSTICK: NEGATIVE
KETONES UR: NEGATIVE mg/dL
Leukocytes, UA: NEGATIVE
NITRITE: NEGATIVE
PH: 5 (ref 5.0–8.0)
Protein, ur: NEGATIVE mg/dL
SPECIFIC GRAVITY, URINE: 1.014 (ref 1.005–1.030)

## 2016-09-13 LAB — BASIC METABOLIC PANEL
Anion gap: 9 (ref 5–15)
BUN: 30 mg/dL — AB (ref 6–20)
CALCIUM: 8.9 mg/dL (ref 8.9–10.3)
CO2: 22 mmol/L (ref 22–32)
CREATININE: 2.09 mg/dL — AB (ref 0.44–1.00)
Chloride: 108 mmol/L (ref 101–111)
GFR, EST AFRICAN AMERICAN: 27 mL/min — AB (ref >60)
GFR, EST NON AFRICAN AMERICAN: 24 mL/min — AB (ref >60)
Glucose, Bld: 116 mg/dL — ABNORMAL HIGH (ref 65–99)
Potassium: 3.7 mmol/L (ref 3.5–5.1)
SODIUM: 139 mmol/L (ref 135–145)

## 2016-09-13 LAB — BLOOD GAS, ARTERIAL
ACID-BASE DEFICIT: 3.2 mmol/L — AB (ref 0.0–2.0)
BICARBONATE: 19.7 mmol/L — AB (ref 20.0–28.0)
Drawn by: 10552
FIO2: 21
O2 SAT: 94.1 %
PATIENT TEMPERATURE: 98.6
PO2 ART: 72.7 mmHg — AB (ref 83.0–108.0)
pCO2 arterial: 30.2 mmHg — ABNORMAL LOW (ref 32.0–48.0)
pH, Arterial: 7.43 (ref 7.350–7.450)

## 2016-09-13 LAB — GLUCOSE, CAPILLARY
GLUCOSE-CAPILLARY: 110 mg/dL — AB (ref 65–99)
GLUCOSE-CAPILLARY: 121 mg/dL — AB (ref 65–99)
Glucose-Capillary: 90 mg/dL (ref 65–99)
Glucose-Capillary: 190 mg/dL — ABNORMAL HIGH (ref 65–99)
Glucose-Capillary: 112 mg/dL — ABNORMAL HIGH (ref 65–99)

## 2016-09-13 LAB — CBG MONITORING, ED: Glucose-Capillary: 110 mg/dL — ABNORMAL HIGH (ref 65–99)

## 2016-09-13 LAB — PROTIME-INR
INR: 2.48
Prothrombin Time: 27.3 seconds — ABNORMAL HIGH (ref 11.4–15.2)

## 2016-09-13 LAB — HEMOGLOBIN A1C
Hgb A1c MFr Bld: 6.8 % — ABNORMAL HIGH (ref 4.8–5.6)
Mean Plasma Glucose: 148.46 mg/dL

## 2016-09-13 MED ORDER — MIRTAZAPINE 15 MG PO TABS
15.0000 mg | ORAL_TABLET | Freq: Every day | ORAL | Status: DC
  Administered 2016-09-13 – 2016-09-25 (×13): 15 mg via ORAL
  Filled 2016-09-13 (×14): qty 1

## 2016-09-13 MED ORDER — ORAL CARE MOUTH RINSE
15.0000 mL | Freq: Two times a day (BID) | OROMUCOSAL | Status: DC
  Administered 2016-09-15 – 2016-09-26 (×18): 15 mL via OROMUCOSAL

## 2016-09-13 MED ORDER — DONEPEZIL HCL 10 MG PO TABS
10.0000 mg | ORAL_TABLET | Freq: Every day | ORAL | Status: DC
  Administered 2016-09-13 – 2016-09-25 (×14): 10 mg via ORAL
  Filled 2016-09-13 (×14): qty 1

## 2016-09-13 MED ORDER — INSULIN ASPART 100 UNIT/ML ~~LOC~~ SOLN
0.0000 [IU] | Freq: Three times a day (TID) | SUBCUTANEOUS | Status: DC
  Administered 2016-09-13: 1 [IU] via SUBCUTANEOUS
  Administered 2016-09-13: 2 [IU] via SUBCUTANEOUS
  Administered 2016-09-16 – 2016-09-17 (×2): 1 [IU] via SUBCUTANEOUS
  Administered 2016-09-17: 2 [IU] via SUBCUTANEOUS
  Administered 2016-09-18 (×2): 1 [IU] via SUBCUTANEOUS
  Administered 2016-09-19: 3 [IU] via SUBCUTANEOUS
  Administered 2016-09-19: 2 [IU] via SUBCUTANEOUS
  Administered 2016-09-20: 1 [IU] via SUBCUTANEOUS
  Administered 2016-09-21 – 2016-09-23 (×4): 2 [IU] via SUBCUTANEOUS
  Administered 2016-09-24: 1 [IU] via SUBCUTANEOUS

## 2016-09-13 MED ORDER — SODIUM CHLORIDE 0.9 % IV SOLN
INTRAVENOUS | Status: DC
  Administered 2016-09-13: 14:00:00 via INTRAVENOUS
  Administered 2016-09-13: 1000 mL via INTRAVENOUS
  Administered 2016-09-14 – 2016-09-15 (×4): via INTRAVENOUS

## 2016-09-13 MED ORDER — AMLODIPINE BESYLATE 5 MG PO TABS: 10.0000 mg | ORAL_TABLET | Freq: Every day | ORAL | Status: DC

## 2016-09-13 NOTE — Care Management Obs Status (Signed)
MEDICARE OBSERVATION STATUS NOTIFICATION   Patient Details  Name: [name] MRN: [id number] Date of Birth: [birth year]   Medicare Observation Status Notification Given:  Yes    Siera Beyersdorf, Gerald Leitz, RN 09/13/2016, 3:58 PM

## 2016-09-13 NOTE — Progress Notes (Signed)
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   Medical History: Past Medical History:  Diagnosis Date  . COPD (chronic obstructive pulmonary disease) (HCC)    on CT scan 06/28/2015  . Dementia   . Diabetes mellitus   . Hypertension   . Hypothyroidism   . Idiopathic acute pancreatitis 09/16/2011  . 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 Memory loss   . Pancreatitis 2010  . Tobacco abuse   . Trigger finger     Medications:  Prescriptions Prior to Admission  Medication Sig Dispense Refill Last Dose  . amLODipine (NORVASC) 5 MG tablet Take 10 mg by mouth daily.    09/12/2016 at Unknown time  . atorvastatin (LIPITOR) 40 MG tablet Take 40 mg by mouth daily.   09/11/2016 at Unknown time  . donepezil (ARICEPT) 10 MG tablet Take 1 tablet (10 mg total) by mouth at bedtime. 90 tablet 0 09/11/2016 at Unknown time  . levothyroxine (SYNTHROID, LEVOTHROID) 25 MCG tablet Take 25 mcg by mouth daily before breakfast. 75 mcg every morning   09/12/2016 at Unknown time  . levothyroxine (SYNTHROID, LEVOTHROID) 50 MCG tablet Take 50 mcg by mouth daily before breakfast. 75 mcg every morning   09/12/2016 at Unknown time  . lisinopril (PRINIVIL,ZESTRIL) 20 MG tablet TAKE 1 TABLET (20 MG TOTAL) BY MOUTH DAILY. 90 tablet 0 09/12/2016 at Unknown time  . metFORMIN (GLUCOPHAGE) 500 MG tablet TAKE ONE TABLET BY MOUTH ONCE DAILY 30 tablet 1 09/12/2016 at Unknown time  . mirtazapine  (REMERON) 15 MG tablet Take 15 mg by mouth at bedtime.    09/11/2016 at Unknown time  . sertraline (ZOLOFT) 100 MG tablet Take 1 tablet (100 mg total) by mouth 2 (two) times daily. 60 tablet 2 09/12/2016 at Unknown time  . warfarin (COUMADIN) 1 MG tablet Take 1 mg by mouth every evening. 11 mg each evening   09/11/2016 at Unknown time  . warfarin (COUMADIN) 10 MG tablet Take 10 mg by mouth every evening. 11 mg each evening   09/11/2016 at Unknown time   Scheduled:  . amLODipine  10 mg Oral Daily  . atorvastatin  40 mg Oral Daily  . donepezil  10 mg Oral QHS  . insulin aspart  0-9 Units Subcutaneous TID WC  . levothyroxine  75 mcg Oral QAC breakfast  . mirtazapine  15 mg Oral QHS  . sertraline  100 mg Oral BID  . warfarin  10 mg Oral q1800  . 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  INR at goal on admit.  Last dose noted on 8/16.  Goal of Therapy:  INR 2-3 Monitor platelets by anticoagulation protocol: Yes   Plan:  Warfarin 10mg  po daily Daily INR  Aleene Davidson Crowford 09/13/2016,5:42 AM

## 2016-09-13 NOTE — Care Management CC44 (Signed)
Condition Code 44 Documentation Completed  Patient Details  Name: [name] MRN: [id number] Date of Birth: 08-Aug-1950   Condition Code 44 given:  Yes Patient signature on Condition Code 44 notice:  Yes (pt refused to sign) Documentation of 2 MD's agreement:  Yes Code 44 added to claim:  Yes    Jas Betten, Gerald Leitz, RN 09/13/2016, 4:03 PM
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
## 2016-09-13 NOTE — ED Notes (Signed)
Patient tried to urinate but was unable to do so. Patient notified of in and out catheter orders.

## 2016-09-13 NOTE — ED Notes (Signed)
Patient refusing EKG to be completed because she is "uncomfortable and in a lot of pain on her bottom". Patient stating she is very agitated and ready to "walk out the doors because no one is listening to what she has to say". Patient also refusing the in and out catheter. Will notify RN.

## 2016-09-14 DIAGNOSIS — F0151 Vascular dementia with behavioral disturbance: Secondary | ICD-10-CM | POA: Diagnosis not present

## 2016-09-14 DIAGNOSIS — N179 Acute kidney failure, unspecified: Secondary | ICD-10-CM | POA: Diagnosis not present

## 2016-09-14 LAB — CBC
HCT: 39.1 % (ref 36.0–46.0)
Hemoglobin: 12.5 g/dL (ref 12.0–15.0)
MCH: 26.8 pg (ref 26.0–34.0)
MCHC: 32 g/dL (ref 30.0–36.0)
MCV: 83.7 fL (ref 78.0–100.0)
PLATELETS: 149 10*3/uL — AB (ref 150–400)
RBC: 4.67 MIL/uL (ref 3.87–5.11)
RDW: 16.2 % — ABNORMAL HIGH (ref 11.5–15.5)
WBC: 6.1 10*3/uL (ref 4.0–10.5)

## 2016-09-14 LAB — BASIC METABOLIC PANEL
Anion gap: 7 (ref 5–15)
BUN: 19 mg/dL (ref 6–20)
CALCIUM: 8.7 mg/dL — AB (ref 8.9–10.3)
CO2: 19 mmol/L — ABNORMAL LOW (ref 22–32)
Chloride: 115 mmol/L — ABNORMAL HIGH (ref 101–111)
Creatinine, Ser: 0.99 mg/dL (ref 0.44–1.00)
GFR calc Af Amer: >60 mL/min (ref >60)
GFR, EST NON AFRICAN AMERICAN: 59 mL/min — AB (ref >60)
GLUCOSE: 101 mg/dL — AB (ref 65–99)
Potassium: 3.8 mmol/L (ref 3.5–5.1)
Sodium: 141 mmol/L (ref 135–145)

## 2016-09-14 LAB — GLUCOSE, CAPILLARY
GLUCOSE-CAPILLARY: 113 mg/dL — AB (ref 65–99)
GLUCOSE-CAPILLARY: 117 mg/dL — AB (ref 65–99)
GLUCOSE-CAPILLARY: 158 mg/dL — AB (ref 65–99)
Glucose-Capillary: 95 mg/dL (ref 65–99)

## 2016-09-14 LAB — VITAMIN B12: Vitamin B-12: 516 pg/mL (ref 180–914)

## 2016-09-14 LAB — PROTIME-INR
INR: 3.25
PROTHROMBIN TIME: 33.9 s — AB (ref 11.4–15.2)

## 2016-09-14 NOTE — Social Work (Addendum)
CSW received call from [facility] requesting CSW work up patient for SNF. CSW will follow as PT evaluation needed.  Keene Breath, LCSW Clinical Social Worker [phone]

## 2016-09-14 NOTE — Care Management Note (Signed)
Case Management Note  Patient Details  Name: [name] MRN: [id number] Date of Birth: January 25, 1951  Subjective/Objective:   Received call from [facility], MD re: safe discharge for this 66 y.o. F who is ready for discharge and has no local  family support. Appears to have had 5d admission May of this year. CM called Elease Hashimoto,  CSW at [facility] to handle this and follow for MD. Sunnie Nielsen, MD has ordered PT eval but has not been completed as of yet. CM suggested MD refer this to Bedside RN to follow up with PT to expedite.   Action/Plan:CM will sign off for now but will be available should additional discharge needs arise or disposition change.    Expected Discharge Date:                  Expected Discharge Plan:  Skilled Nursing Facility  In-House Referral:  Clinical Social Work  Discharge planning Services  CM Consult  Post Acute Care Choice:    Choice offered to:     DME Arranged:    DME Agency:     HH Arranged:    HH Agency:     Status of Service:  In process, will continue to follow  If discussed at Long Length of Stay Meetings, dates discussed:    Additional Comments:  Yvone Neu, RN 09/14/2016, 10:07 AM

## 2016-09-14 NOTE — Progress Notes (Signed)
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  No Known Allergies  Patient Measurements: Height: 5\' 5"  (165.1 cm) Weight: 217 lb (98.4 kg) IBW/kg (Calculated) : 57 Heparin Dosing Weight:   Vital Signs: Temp: 97.7 F (36.5 C) (08/19 0846) Temp Source: Oral (08/19 0846) BP: 126/75 (08/19 0846) Pulse Rate: 51 (08/19 0846)  Labs:  Recent Labs  09/12/16 2010 09/13/16 0523 09/13/16 0810 09/14/16 0559  HGB 13.5 12.9  --  12.5  HCT 41.2 39.6  --  39.1  PLT 176 174  --  149*  APTT 40*  --   --   --   LABPROT 25.2* 27.3*  --  33.9*  INR 2.24 2.48  --  3.25  CREATININE 2.69*  --  2.09* 0.99   Estimated Creatinine Clearance: 65.8 mL/min (by C-G formula based on SCr of 0.99 mg/dL).  Medical History: Past Medical History:  Diagnosis Date  . COPD (chronic obstructive pulmonary disease) (HCC)    on CT scan 06/28/2015  . Dementia   . Diabetes mellitus   . Hypertension   . Hypothyroidism   . Idiopathic acute pancreatitis 09/16/2011  . 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 Memory loss   . Pancreatitis 2010  . Tobacco abuse   . Trigger finger    Medications:  Prescriptions Prior to Admission  Medication Sig Dispense Refill Last Dose  . amLODipine (NORVASC) 5 MG tablet Take 10 mg by mouth daily.    09/12/2016 at Unknown time  . atorvastatin (LIPITOR) 40 MG tablet Take 40 mg by mouth daily.   09/11/2016 at Unknown time  . donepezil (ARICEPT) 10 MG tablet Take 1 tablet (10 mg total) by mouth at bedtime. 90 tablet 0 09/11/2016 at Unknown time  . levothyroxine (SYNTHROID, LEVOTHROID) 25 MCG tablet Take 25 mcg by mouth daily before breakfast. 75 mcg every morning   09/12/2016 at Unknown time  . levothyroxine (SYNTHROID, LEVOTHROID) 50 MCG tablet Take 50 mcg by mouth daily before breakfast. 75 mcg every morning   09/12/2016 at Unknown time  . lisinopril (PRINIVIL,ZESTRIL) 20 MG tablet TAKE 1 TABLET (20 MG  TOTAL) BY MOUTH DAILY. 90 tablet 0 09/12/2016 at Unknown time  . metFORMIN (GLUCOPHAGE) 500 MG tablet TAKE ONE TABLET BY MOUTH ONCE DAILY 30 tablet 1 09/12/2016 at Unknown time  . mirtazapine (REMERON) 15 MG tablet Take 15 mg by mouth at bedtime.    09/11/2016 at Unknown time  . sertraline (ZOLOFT) 100 MG tablet Take 1 tablet (100 mg total) by mouth 2 (two) times daily. 60 tablet 2 09/12/2016 at Unknown time  . warfarin (COUMADIN) 1 MG tablet Take 1 mg by mouth every evening. 11 mg each evening   09/11/2016 at Unknown time  . warfarin (COUMADIN) 10 MG tablet Take 10 mg by mouth every evening. 11 mg each evening   09/11/2016 at Unknown time   Scheduled:  . atorvastatin  40 mg Oral Daily  . donepezil  10 mg Oral QHS  . insulin aspart  0-9 Units Subcutaneous TID WC  . levothyroxine  75 mcg Oral QAC breakfast  . mouth rinse  15 mL Mouth Rinse BID  . mirtazapine  15 mg Oral QHS  . sertraline  100 mg Oral BID  . warfarin  10 mg Oral q1800  . Warfarin - Pharmacist Dosing Inpatient   Does not apply q1800   Assessment: 70 yoF, CVA May/18, living with friend after rehab. To ED reportedly "  needing her INR checked". 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 H/H wnl, Plt sl decreased, 149K (8/19)  Goal of Therapy:  INR 2-3 Monitor platelets by anticoagulation protocol: Yes   Plan:   No Warfarin today  Daily PT/INR  Otho Bellows PharmD Pager [phone] 09/14/2016, 9:27 AM
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
## 2016-09-14 NOTE — Progress Notes (Signed)
Report taken from [facility]. Assessment is unchanged from previous assessment in EPIC
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
## 2016-09-15 DIAGNOSIS — Z811 Family history of alcohol abuse and dependence: Secondary | ICD-10-CM | POA: Diagnosis not present

## 2016-09-15 DIAGNOSIS — F17210 Nicotine dependence, cigarettes, uncomplicated: Secondary | ICD-10-CM

## 2016-09-15 DIAGNOSIS — N179 Acute kidney failure, unspecified: Secondary | ICD-10-CM | POA: Diagnosis not present

## 2016-09-15 DIAGNOSIS — F0151 Vascular dementia with behavioral disturbance: Secondary | ICD-10-CM | POA: Diagnosis not present

## 2016-09-15 DIAGNOSIS — E1110 Type 2 diabetes mellitus with ketoacidosis without coma: Secondary | ICD-10-CM | POA: Diagnosis not present

## 2016-09-15 DIAGNOSIS — E1165 Type 2 diabetes mellitus with hyperglycemia: Secondary | ICD-10-CM | POA: Diagnosis not present

## 2016-09-15 LAB — CBC
HEMATOCRIT: 38.2 % (ref 36.0–46.0)
Hemoglobin: 12.6 g/dL (ref 12.0–15.0)
MCH: 27.1 pg (ref 26.0–34.0)
MCHC: 33 g/dL (ref 30.0–36.0)
MCV: 82.2 fL (ref 78.0–100.0)
PLATELETS: 149 10*3/uL — AB (ref 150–400)
RBC: 4.65 MIL/uL (ref 3.87–5.11)
RDW: 16.1 % — ABNORMAL HIGH (ref 11.5–15.5)
WBC: 5 10*3/uL (ref 4.0–10.5)

## 2016-09-15 LAB — BASIC METABOLIC PANEL
ANION GAP: 6 (ref 5–15)
BUN: 15 mg/dL (ref 6–20)
CO2: 22 mmol/L (ref 22–32)
Calcium: 8.9 mg/dL (ref 8.9–10.3)
Chloride: 112 mmol/L — ABNORMAL HIGH (ref 101–111)
Creatinine, Ser: 0.97 mg/dL (ref 0.44–1.00)
GFR calc Af Amer: >60 mL/min (ref >60)
GFR, EST NON AFRICAN AMERICAN: 60 mL/min — AB (ref >60)
GLUCOSE: 97 mg/dL (ref 65–99)
POTASSIUM: 4.1 mmol/L (ref 3.5–5.1)
Sodium: 140 mmol/L (ref 135–145)

## 2016-09-15 LAB — GLUCOSE, CAPILLARY
Glucose-Capillary: 90 mg/dL (ref 65–99)
Glucose-Capillary: 112 mg/dL — ABNORMAL HIGH (ref 65–99)
Glucose-Capillary: 146 mg/dL — ABNORMAL HIGH (ref 65–99)
Glucose-Capillary: 211 mg/dL — ABNORMAL HIGH (ref 65–99)

## 2016-09-15 LAB — PROTIME-INR
INR: 4.06
Prothrombin Time: 40.5 seconds — ABNORMAL HIGH (ref 11.4–15.2)

## 2016-09-15 MED ORDER — LORAZEPAM 2 MG/ML IJ SOLN: 0.5000 mg | Freq: Once | INTRAMUSCULAR | Status: DC

## 2016-09-15 MED ORDER — LORAZEPAM 2 MG/ML IJ SOLN
1.0000 mg | Freq: Once | INTRAMUSCULAR | Status: AC
  Administered 2016-09-15: 1 mg via INTRAVENOUS
  Filled 2016-09-15: qty 1
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
## 2016-09-15 NOTE — Care Management Note (Signed)
Case Management Note  Patient Details  Name: [name] MRN: [id number] Date of Birth: 1950/06/18  Subjective/Objective:     aki               Action/Plan: Date:  September 15, 2016  Chart reviewed for concurrent status and case management needs.  Will continue to follow patient progress.  Discharge Planning: following for needs  Expected discharge date: [id number]  Marcelle Smiling, BSN, [place], [place]   [phone]   Expected Discharge Date:                  Expected Discharge Plan:  Skilled Nursing Facility  In-House Referral:  Clinical Social Work  Discharge planning Services  CM Consult  Post Acute Care Choice:    Choice offered to:     DME Arranged:    DME Agency:     HH Arranged:    HH Agency:     Status of Service:  In process, will continue to follow  If discussed at Long Length of Stay Meetings, dates discussed:    Additional Comments:  Golda Acre, RN 09/15/2016, 10:38 AM
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
## 2016-09-15 NOTE — Progress Notes (Signed)
CRITICAL VALUE ALERT  Critical Value:  INR 4.06  Date & Time Notied:  8/20 0824  Provider Notified: Dr. Linde Gillis  Orders Received/Actions taken: MD gave order to hold coumadin dose tonight

## 2016-09-15 NOTE — Consult Note (Signed)
[facility] Consult   Reason for Consult:  Depression and s/p occipital stroke Referring Physician:  Dr. Tyrell Antonio Patient Identification: [name] MRN:  [id number] Principal Diagnosis: <principal problem not specified> Diagnosis:   Patient Active Problem List   Diagnosis Date Noted  . ARF (acute renal failure) ([place]) [N17.9] 09/13/2016  . Hyperglycemia due to type 2 diabetes mellitus ([place]) [E11.65] 09/13/2016  . DKA, type 2 ([place]) [E11.10] 09/12/2016  . 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 Dyslipidemia associated with type 2 diabetes mellitus ([place]) [E11.69, E78.5] 06/06/2016  . Depression with anxiety [F41.8] 06/06/2016  . Thrombocytopenia ([place]) [D69.6]   . Sundowning [F05]   . Non-compliance [Z91.19]   . Chronic obstructive pulmonary disease ([place]) [J44.9]   . Benign essential HTN [I10]   . History of CVA (cerebrovascular accident) [Z86.73]   . Dementia with behavioral disturbance [F03.91]   . Occipital stroke ([place]) [I63.9]   . 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 Chronic anticoagulation [Z79.01]   . Vertigo [R42]   . AKI (acute kidney injury) ([place]) [N17.9] 06/28/2015  . Protein-calorie malnutrition, moderate ([place]) [E44.0] 06/28/2015  . Acute CVA (cerebrovascular accident) ([place]) [I63.9] 06/28/2015  . Hypokalemia [E87.6]   . Cerebellar stroke ([place]) [I63.9]   . Emesis [R11.10]   . Anxiety [F41.9] 06/27/2015  . Dizziness [R42] 06/27/2015  . Tobacco abuse [Z72.0]   . Early onset Alzheimer's dementia without behavioral disturbance [G30.0, F02.80] 11/03/2014  . Memory deficits [R41.3] 07/03/2014  . Trigger finger, acquired [M65.30] 09/13/2012  . Pain in both wrists [M25.531, M25.532] 05/12/2012  . Non-proliferative diabetic retinopathy, both eyes ([place]) [E11.3293] 05/07/2012  . Colon cancer screening [Z12.11] 12/23/2011  . Unintentional weight loss [R63.4] 12/22/2011  . Memory problem [R41.3] 10/31/2011  .  Idiopathic acute pancreatitis [K85.00] 10/29/2011  . Major depressive disorder [F32.9] 09/10/2011  . Hypertension [I10] 09/10/2011  . Chronic back pain [M54.9, G89.29] 09/10/2011  . Hyperlipidemia [E78.5] 09/10/2011  . Type II diabetes mellitus with neurological manifestations ([place]) [E11.49] 09/10/2011    Total Time spent with patient: 1 hour  Subjective:   [name] is a 66 y.o. female patient admitted with was drop at the emergency department and need her Coumadin levels checked.  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 Patient has a recent history of occipital stroke and continued to be poor historian. Patient remember having couple of strokes and need to go to her family in [place]. Patient reported she has no family members in [place] even though she has been staying here in [place] over 49 years used to work for [employer]. Patient reported she has a cousin who has been starting again could not tell the time frame. Patient has significant cognitive symptoms she has a poor historian, not oriented to the place and situation, she knows she had a stroke she knows she is forgetting things she now she needs somebody to help her. Patient denied symptoms of depression, mania and psychosis. Patient has no suicidal/homicidal ideation, intention or plans.  Past Psychiatric History: Patient has no history of psychiatric inpatient or outpatient treatment services.  Risk to Self: Is patient at risk for suicide?: No Risk to Others:   Prior Inpatient Therapy:  Prior Outpatient Therapy:    Past Medical History:  Past Medical History:  Diagnosis Date  . COPD  (chronic obstructive pulmonary disease) ([place])    on CT scan 06/28/2015  . Dementia   . Diabetes mellitus   . Hypertension   . Hypothyroidism   . Idiopathic acute pancreatitis 09/16/2011  . 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 Memory loss   . Pancreatitis 2010  . Tobacco abuse   . Trigger finger     Past Surgical History:  Procedure Laterality Date  . BACK SURGERY    . CHOLECYSTECTOMY  2003  . CYST REMOVAL HAND Left 12/11/2014   Procedure: LEFT RING FINGER TENDON SHEATH CYST EXCISION;  Surgeon: Milly Jakob, MD;  Location: [facility];  Service: Orthopedics;  Laterality: Left;  . SPINE SURGERY  2006   Dr. Glenna Fellows - L3-L4, L4-L5 laminotomy and foraminotomy  . TEE WITHOUT CARDIOVERSION N/A 07/03/2015   Procedure: TRANSESOPHAGEAL ECHOCARDIOGRAM (TEE);  Surgeon: Sueanne Margarita, MD;  Location: [facility] ENDOSCOPY;  Service: Cardiovascular;  Laterality: N/A;   Family History:  Family History  Problem Relation Age of Onset  . Alcohol abuse Father   . Cancer Mother    Family Psychiatric  History: This denied family history of mental illness.  Social History:  History  Alcohol Use No     History  Drug Use No    Social History   Social History  . Marital status: Single    Spouse name: N/A  . Number of children: N/A  . Years of education: N/A   Social History Main Topics  . Smoking status: Former Smoker    Packs/day: 0.50    Types: Cigarettes    Start date: 07/01/2015  . Smokeless tobacco: Never Used     Comment: still in stress. looking for job.   . Alcohol use No  . Drug use: No  . Sexual activity: Not Asked   Other Topics Concern  . None   Social History Narrative   Lives with boyfriend.  No children.  Retired from [employer].  Education: 2 years of college.   Additional Social History:    Allergies:  No Known Allergies  Labs:  Results for orders placed or performed during the hospital encounter of 09/12/16 (from the past 48 hour(s))  Glucose,  capillary     Status: Abnormal   Collection Time: 09/13/16  5:05 PM  Result Value Ref Range   Glucose-Capillary 190 (H) 65 - 99 mg/dL  Glucose, capillary     Status: Abnormal   Collection Time: 09/13/16  8:43 PM  Result Value Ref Range   Glucose-Capillary 112 (H) 65 - 99 mg/dL  Protime-INR     Status: Abnormal   Collection Time: 09/14/16  5:59 AM  Result Value Ref Range   Prothrombin Time 33.9 (H) 11.4 - 15.2 seconds   INR 3.66   Basic metabolic panel     Status: Abnormal   Collection Time: 09/14/16  5:59 AM  Result Value Ref Range   Sodium 141 135 - 145 mmol/L   Potassium 3.8 3.5 - 5.1 mmol/L   Chloride 115 (H) 101 - 111 mmol/L   CO2 19 (L) 22 - 32 mmol/L   Glucose, Bld 101 (H) 65 - 99 mg/dL   BUN 19 6 - 20 mg/dL   Creatinine, Ser 0.99 0.44 - 1.00 mg/dL    Comment: DELTA CHECK NOTED   Calcium 8.7 (L) 8.9 - 10.3 mg/dL   GFR calc  non Af Amer 59 (L) >60 mL/min   GFR calc Af Amer >60 >60 mL/min    Comment: (NOTE) The eGFR has been calculated using the CKD EPI equation. This calculation has not been validated in all clinical situations. eGFR's persistently <60 mL/min signify possible Chronic Kidney Disease.    Anion gap 7 5 - 15  Vitamin B12     Status: None   Collection Time: 09/14/16  5:59 AM  Result Value Ref Range   Vitamin B-12 516 180 - 914 pg/mL    Comment: (NOTE) This assay is not validated for testing neonatal or myeloproliferative syndrome specimens for Vitamin B12 levels. Performed at [facility], [place] [address], [place], [place] [address]   CBC     Status: Abnormal   Collection Time: 09/14/16  5:59 AM  Result Value Ref Range   WBC 6.1 4.0 - 10.5 K/uL   RBC 4.67 3.87 - 5.11 MIL/uL   Hemoglobin 12.5 12.0 - 15.0 g/dL   HCT 39.1 36.0 - 46.0 %   MCV 83.7 78.0 - 100.0 fL   MCH 26.8 26.0 - 34.0 pg   MCHC 32.0 30.0 - 36.0 g/dL   RDW 16.2 (H) 11.5 - 15.5 %   Platelets 149 (L) 150 - 400 K/uL  Glucose, capillary     Status: None   Collection Time: 09/14/16   7:17 AM  Result Value Ref Range   Glucose-Capillary 95 65 - 99 mg/dL  Glucose, capillary     Status: Abnormal   Collection Time: 09/14/16 11:51 AM  Result Value Ref Range   Glucose-Capillary 113 (H) 65 - 99 mg/dL  Glucose, capillary     Status: Abnormal   Collection Time: 09/14/16  4:54 PM  Result Value Ref Range   Glucose-Capillary 117 (H) 65 - 99 mg/dL   Comment 1 Notify RN   Glucose, capillary     Status: Abnormal   Collection Time: 09/14/16  8:26 PM  Result Value Ref Range   Glucose-Capillary 158 (H) 65 - 99 mg/dL  Protime-INR     Status: Abnormal   Collection Time: 09/15/16  5:19 AM  Result Value Ref Range   Prothrombin Time 40.5 (H) 11.4 - 15.2 seconds   INR 4.06 (HH)     Comment: CRITICAL RESULT CALLED TO, READ BACK BY AND VERIFIED WITH: YOUNT, C. RN '@0815'$  ON 8.20.18 BY NMCCOY   Glucose, capillary     Status: None   Collection Time: 09/15/16  7:36 AM  Result Value Ref Range   Glucose-Capillary 90 65 - 99 mg/dL  CBC     Status: Abnormal   Collection Time: 09/15/16  8:07 AM  Result Value Ref Range   WBC 5.0 4.0 - 10.5 K/uL   RBC 4.65 3.87 - 5.11 MIL/uL   Hemoglobin 12.6 12.0 - 15.0 g/dL   HCT 38.2 36.0 - 46.0 %   MCV 82.2 78.0 - 100.0 fL   MCH 27.1 26.0 - 34.0 pg   MCHC 33.0 30.0 - 36.0 g/dL   RDW 16.1 (H) 11.5 - 15.5 %   Platelets 149 (L) 150 - 400 K/uL  Basic metabolic panel     Status: Abnormal   Collection Time: 09/15/16  8:07 AM  Result Value Ref Range   Sodium 140 135 - 145 mmol/L   Potassium 4.1 3.5 - 5.1 mmol/L   Chloride 112 (H) 101 - 111 mmol/L   CO2 22 22 - 32 mmol/L   Glucose, Bld 97 65 - 99 mg/dL  BUN 15 6 - 20 mg/dL   Creatinine, Ser 0.97 0.44 - 1.00 mg/dL   Calcium 8.9 8.9 - 10.3 mg/dL   GFR calc non Af Amer 60 (L) >60 mL/min   GFR calc Af Amer >60 >60 mL/min    Comment: (NOTE) The eGFR has been calculated using the CKD EPI equation. This calculation has not been validated in all clinical situations. eGFR's persistently <60 mL/min signify  possible Chronic Kidney Disease.    Anion gap 6 5 - 15  Glucose, capillary     Status: Abnormal   Collection Time: 09/15/16 11:41 AM  Result Value Ref Range   Glucose-Capillary 112 (H) 65 - 99 mg/dL    Current Facility-Administered Medications  Medication Dose Route Frequency Provider Last Rate Last Dose  . 0.9 %  sodium chloride infusion   Intravenous Continuous Regalado, Belkys A, MD 50 mL/hr at 09/14/16 2310    . acetaminophen (TYLENOL) tablet 650 mg  650 mg Oral Q6H PRN Norval Morton, MD       Or  . acetaminophen (TYLENOL) suppository 650 mg  650 mg Rectal Q6H PRN Smith, Rondell A, MD      . albuterol (PROVENTIL) (2.5 MG/3ML) 0.083% nebulizer solution 2.5 mg  2.5 mg Nebulization Q4H PRN Smith, Rondell A, MD      . atorvastatin (LIPITOR) tablet 40 mg  40 mg Oral Daily Tamala Julian, Rondell A, MD   40 mg at 09/15/16 0908  . donepezil (ARICEPT) tablet 10 mg  10 mg Oral QHS Smith, Rondell A, MD   10 mg at 09/14/16 2311  . insulin aspart (novoLOG) injection 0-9 Units  0-9 Units Subcutaneous TID WC Fuller Plan A, MD   2 Units at 09/13/16 1750  . levothyroxine (SYNTHROID, LEVOTHROID) tablet 75 mcg  75 mcg Oral QAC breakfast Fuller Plan A, MD   75 mcg at 09/15/16 0820  . MEDLINE mouth rinse  15 mL Mouth Rinse BID Regalado, Belkys A, MD      . mirtazapine (REMERON) tablet 15 mg  15 mg Oral QHS Smith, Rondell A, MD   15 mg at 09/14/16 2311  . ondansetron (ZOFRAN) tablet 4 mg  4 mg Oral Q6H PRN Fuller Plan A, MD       Or  . ondansetron (ZOFRAN) injection 4 mg  4 mg Intravenous Q6H PRN Smith, Rondell A, MD      . sertraline (ZOLOFT) tablet 100 mg  100 mg Oral BID Fuller Plan A, MD   100 mg at 09/15/16 0908  . Warfarin - Pharmacist Dosing Inpatient   Does not apply q1800 Norval Morton, MD   Stopped at 09/14/16 1800    Musculoskeletal: Strength & Muscle Tone: within normal limits Gait & Station: unable to stand Patient leans: N/A  Psychiatric Specialty Exam: Physical Exam as per  history and physical   ROS memory deficits, poor orientation and concentration.  Denied nausea, vomiting, abdomen pain, shortness of breath adjustment. No Fever-chills, No Headache, No changes with Vision or hearing, reports vertigo No problems swallowing food or Liquids, No Chest pain, Cough or Shortness of Breath, No Abdominal pain, No Nausea or Vommitting, Bowel movements are regular, No Blood in stool or Urine, No dysuria, No new skin rashes or bruises, No new joints pains-aches,  No new weakness, tingling, numbness in any extremity, No recent weight gain or loss, No polyuria, polydypsia or polyphagia,  A full 10 point Review of Systems was done, except as stated above, all other Review of Systems  were negative.  Blood pressure 133/69, pulse 60, temperature 98.4 F (36.9 C), temperature source Oral, resp. rate 18, height '5\' 5"'$  (1.651 m), weight 98.4 kg (217 lb), SpO2 92 %.Body mass index is 36.11 kg/m.  General Appearance: Casual  Eye Contact:  Good  Speech:  Clear and Coherent  Volume:  Normal  Mood:  Euthymic  Affect:  Appropriate and Congruent  Thought Process:  Coherent and Goal Directed  Orientation:  Full (Time, Place, and Person)  Thought Content:  Patient knows her first name, last name and date of birth but does not recall need of current hospitalization other than stroke at least twice in the past.  Suicidal Thoughts:  No  Homicidal Thoughts:  No  Memory:  Immediate;   Fair Recent;   Poor Remote;   Good  Judgement:  Fair  Insight:  Shallow  Psychomotor Activity:  Decreased  Concentration:  Concentration: Fair and Attention Span: Poor  Recall:  Poor  Fund of Knowledge:  Fair  Language:  Good  Akathisia:  Negative  Handed:  Right  AIMS (if indicated):     Assets:  Communication Skills Desire for Improvement Financial Resources/Insurance Housing Leisure Time  ADL's:  Impaired  Cognition:  Impaired,  Moderate  Sleep:        Treatment Plan Summary: 66  years old female with the recent history of occipital stroke came to [facility] after somebody dropped her at [facility] emergency department aftephysical and verbalflict with her friend.   Cognitive disorder not otherwise specified versus dementia Status post occipital stroke  Recommendation: Patient has no safety concerns as she denies current suicidal/homicidal ideation, intention or plans Recommended no psychotropic medication May discontinue safety sitter and is needed for personal assistance secondary to cognitive deficits and fall risk Patient benefit from skilled nursing facility placement as she cannot care for herself secondary to moderate to severe cognitive deficits.  Disposition: No evidence of imminent risk to self or others at present.   Patient does not meet criteria for psychiatric inpatient admission. Supportive therapy provided about ongoing stressors.  Ambrose Finland, MD 09/15/2016 12:06 PM

## 2016-09-15 NOTE — Progress Notes (Signed)
MD gave orders for patient to be placed on suicide precautions.  Sitter at bedside.  Per policy, personal belongings (cell phone, handbag, charger, etc) were moved from patients room.  Patient became very upset and belligerent with staff.  Yelling from patient could be heard down the hall.  Security called.  2 bags placed in the patient bins in the clean supply room.  MD notified.  Gave one time order for Ativan.  Patient resting in bed with sitter at bedside.  Will continue to monitor.

## 2016-09-15 NOTE — Progress Notes (Addendum)
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  No Known Allergies  Patient Measurements: Height: 5\' 5"  (165.1 cm) Weight: 217 lb (98.4 kg) IBW/kg (Calculated) : 57   Vital Signs: Temp: 98.4 F (36.9 C) (08/20 0431) Temp Source: Oral (08/20 0431) BP: 133/69 (08/20 0431) Pulse Rate: 60 (08/20 0019)  Labs:  Recent Labs  09/12/16 2010 09/13/16 0523 09/13/16 0810 09/14/16 0559 09/15/16 0519  HGB 13.5 12.9  --  12.5  --   HCT 41.2 39.6  --  39.1  --   PLT 176 174  --  149*  --   APTT 40*  --   --   --   --   LABPROT 25.2* 27.3*  --  33.9* 40.5*  INR 2.24 2.48  --  3.25 4.06*  CREATININE 2.69*  --  2.09* 0.99  --    Estimated Creatinine Clearance: 65.8 mL/min (by C-G formula based on SCr of 0.99 mg/dL).  Assessment: 25 yoF, CVA May/18, living with friend after rehab. To ED reportedly "needing her INR checked". Will need placement at discharge  Today, 09/15/2016  INR 4.06, above desired range, home dose Warfarin 10mg  daily H/H wnl, Plt sl decreased, 149K, no bleeding reported  Goal of Therapy:  INR 2-3 Monitor platelets by anticoagulation protocol: Yes   Plan:   No Warfarin today  Daily PT/INR  Herby Abraham, Pharm.D. [phone] 09/15/2016 8:28 AM

## 2016-09-16 DIAGNOSIS — N179 Acute kidney failure, unspecified: Secondary | ICD-10-CM | POA: Diagnosis not present

## 2016-09-16 LAB — GLUCOSE, CAPILLARY
Glucose-Capillary: 109 mg/dL — ABNORMAL HIGH (ref 65–99)
Glucose-Capillary: 146 mg/dL — ABNORMAL HIGH (ref 65–99)
Glucose-Capillary: 118 mg/dL — ABNORMAL HIGH (ref 65–99)
Glucose-Capillary: 178 mg/dL — ABNORMAL HIGH (ref 65–99)

## 2016-09-16 LAB — PROTIME-INR
INR: 2.93
PROTHROMBIN TIME: 31.2 s — AB (ref 11.4–15.2)

## 2016-09-16 MED ORDER — WARFARIN SODIUM 5 MG PO TABS
10.0000 mg | ORAL_TABLET | Freq: Every day | ORAL | Status: DC
  Administered 2016-09-16 – 2016-09-21 (×6): 10 mg via ORAL
  Filled 2016-09-16 (×7): qty 2

## 2016-09-16 NOTE — NC FL2 (Signed)
[place] MEDICAID FL2 LEVEL OF CARE SCREENING TOOL     IDENTIFICATION  Patient Name: [name] Birthdate: [birth year] Sex: female Admission Date (Current Location): 09/12/2016  [facility] and [place] Number:  Producer, television/film/video and Address:  [facility],  [address] [place], [place] [address]      Provider Number: [id number]  Attending Physician Name and Address:  Alba Cory, MD  Relative Name and Phone Number:  Relative: Jones,[name]- [phone]     Current Level of Care: Hospital Recommended Level of Care: [facility] Prior Approval Number:    Date Approved/Denied: 09/14/16 PASRR Number:   Discharge Plan: Other (Comment) (Family care home)    Current Diagnoses: Patient Active Problem List   Diagnosis Date Noted  . ARF (acute renal failure) (HCC) 09/13/2016  . Hyperglycemia due to type 2 diabetes mellitus (HCC) 09/13/2016  . DKA, type 2 (HCC) 09/12/2016  . Hypothyroidism due to acquired atrophy of thyroid 06/06/2016  . Dyslipidemia associated with type 2 diabetes mellitus (HCC) 06/06/2016  . Depression with anxiety 06/06/2016  . Thrombocytopenia (H[facility])   . Sundowning   . Non-compliance   . Chronic obstructive pulmonary disease (HCC)   . Benign essential HTN   . History of CVA (cerebrovascular accident)   . Dementia with behavioral disturbance   . Occipital stroke (HCC)   . 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 Chronic anticoagulation   . Vertigo   . AKI (acute kidney injury) (HCC) 06/28/2015  . Protein-calorie malnutrition, moderate (HCC) 06/28/2015  . Acute CVA (cerebrovascular accident) (HCC) 06/28/2015  . Hypokalemia   . Cerebellar stroke (HCC)   . Emesis   . Anxiety 06/27/2015  . Dizziness 06/27/2015  . Tobacco abuse   . Early onset Alzheimer's dementia without behavioral disturbance 11/03/2014  . Memory deficits 07/03/2014  . Trigger finger, acquired 09/13/2012  . Pain in both wrists 05/12/2012  . Non-proliferative  diabetic retinopathy, both eyes (HCC) 05/07/2012  . Colon cancer screening 12/23/2011  . Unintentional weight loss 12/22/2011  . Memory problem 10/31/2011  . Idiopathic acute pancreatitis 10/29/2011  . Major depressive disorder 09/10/2011  . Hypertension 09/10/2011  . Chronic back pain 09/10/2011  . Hyperlipidemia 09/10/2011  . Type II diabetes mellitus with neurological manifestations (HCC) 09/10/2011    Orientation RESPIRATION BLADDER Height & Weight     Self, Place  Normal Continent Weight: 217 lb (98.4 kg) Height:  5\' 5"  (165.1 cm)  BEHAVIORAL SYMPTOMS/MOOD NEUROLOGICAL BOWEL NUTRITION STATUS   (Dementia)   Continent Diet (Diet Carb Modified.)  AMBULATORY STATUS COMMUNICATION OF NEEDS Skin   Supervision Verbally Normal                       Personal Care Assistance Level of Assistance  Bathing, Feeding, Dressing Bathing Assistance: Independent Feeding assistance: Independent Dressing Assistance: Independent     Functional Limitations Info  Sight, Hearing, Speech Sight Info: Adequate Hearing Info: Adequate Speech Info: Adequate    SPECIAL CARE FACTORS FREQUENCY                       Contractures Contractures Info: Not present    Additional Factors Info  Code Status, Allergies, Psychotropic, Insulin Sliding Scale Code Status Info: FullCode  Allergies Info: No known Allergies  Psychotropic Info: Remeron, Zoloft Insulin Sliding Scale Info: Insulin Daily       Current Medications (09/16/2016):  This is the current hospital active medication list Current Facility-Administered Medications  Medication Dose Route Frequency Provider Last Rate Last Dose  . acetaminophen (TYLENOL) tablet 650 mg  650 mg Oral Q6H PRN Clydie Braun, MD       Or  . acetaminophen (TYLENOL) suppository 650 mg  650 mg Rectal Q6H PRN Smith, Rondell A, MD      . albuterol (PROVENTIL) (2.5 MG/3ML) 0.083% nebulizer solution 2.5 mg  2.5 mg Nebulization Q4H PRN Smith, Rondell A, MD       . atorvastatin (LIPITOR) tablet 40 mg  40 mg Oral Daily Madelyn Flavors A, MD   40 mg at 09/16/16 6629  . donepezil (ARICEPT) tablet 10 mg  10 mg Oral QHS Smith, Rondell A, MD   10 mg at 09/15/16 2155  . insulin aspart (novoLOG) injection 0-9 Units  0-9 Units Subcutaneous TID WC Madelyn Flavors A, MD   1 Units at 09/16/16 1305  . levothyroxine (SYNTHROID, LEVOTHROID) tablet 75 mcg  75 mcg Oral QAC breakfast Madelyn Flavors A, MD   75 mcg at 09/16/16 0831  . MEDLINE mouth rinse  15 mL Mouth Rinse BID Regalado, Belkys A, MD   15 mL at 09/16/16 0938  . mirtazapine (REMERON) tablet 15 mg  15 mg Oral QHS Madelyn Flavors A, MD   15 mg at 09/15/16 2155  . ondansetron (ZOFRAN) tablet 4 mg  4 mg Oral Q6H PRN Madelyn Flavors A, MD       Or  . ondansetron (ZOFRAN) injection 4 mg  4 mg Intravenous Q6H PRN Smith, Rondell A, MD      . sertraline (ZOLOFT) tablet 100 mg  100 mg Oral BID Madelyn Flavors A, MD   100 mg at 09/16/16 4765  . warfarin (COUMADIN) tablet 10 mg  10 mg Oral q1800 Herby Abraham, RPH      . Warfarin - Pharmacist Dosing Inpatient   Does not apply q1800 Clydie Braun, MD   Stopped at 09/14/16 1800     Discharge Medications: Please see discharge summary for a list of discharge medications.  Relevant Imaging Results:  Relevant Lab Results:   Additional Information SS#:[id number]  [place], [place]
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
## 2016-09-16 NOTE — Progress Notes (Signed)
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  Assessment: 60 yoF, CVA May/18, living with friend after rehab. To ED reportedly "needing her INR checked". Will need placement at discharge  Today, 09/16/2016  INR 2.93 therapeutic after dose held x 2 days. H/H wnl, Plt sl decreased, 149K ( on 8/19 and 8/20), no bleeding reported  Goal of Therapy:  INR 2-3 Monitor platelets by anticoagulation protocol: Yes   Plan:   Resume home dose of 10 mg daily  Daily PT/INR  Herby Abraham, Pharm.D. [phone] 09/16/2016 10:11 AM

## 2016-09-16 NOTE — Progress Notes (Signed)
No change from earlier shift asst. Will c/t monitor.  

## 2016-09-16 NOTE — Progress Notes (Addendum)
9:00 amCSW discussed case with CSW Chiropodist, at this time the plan is to inquire about ALF/Memory[facility] for private pay.  CSW left voicemail for patient nephew.    11:45 am CSW faxed clinical information to [facility] for review. CSW spoke to [place] at [facility], she reports she worked with patient nephew, [facility] and friend for potential placement about a week ago. She reports the patient does not require special needs assistance- hands on assistance with ADL's. The patient can bathe, dress, and feed herself without assistance. The facility did not extend offer. Family at the time was unsure of payer source.  CSW spoke with Maryruth Hancock, confirmed attempt to place patient from home. She Faxed list of placement options for csw to reference.   2:30 pmCSW received call from patient Long time friend Henrine Screws [phone] , pt. gave csw permission to talk with her. She provided csw with patient income information (SSI-1481.00 a month) and stated the patient has a medicaid pending number [id number] S. She states DSS social worker :Radiographer, therapeutic has been assisting with medicaid process.  Patient monthly income qualifies her for family home placement.   CSW spoke to patient nephew, he is agreeable to family care home placement at this ttime.  CSW searching for Family care home.  Called left voicemail for  [facility] care [facility] [place] [facility].  [facility] [place] [facility]-Spoke to aide Misty Stanley, requesting FL2.       Vivi Barrack, Theresia Majors, MSW Clinical Social Worker 5E and Psychiatric Service Line [phone] 09/16/2016  9:37 AM

## 2016-09-17 DIAGNOSIS — E1165 Type 2 diabetes mellitus with hyperglycemia: Secondary | ICD-10-CM | POA: Diagnosis not present

## 2016-09-17 DIAGNOSIS — F0151 Vascular dementia with behavioral disturbance: Secondary | ICD-10-CM | POA: Diagnosis not present

## 2016-09-17 DIAGNOSIS — I10 Essential (primary) hypertension: Secondary | ICD-10-CM | POA: Diagnosis not present

## 2016-09-17 DIAGNOSIS — N179 Acute kidney failure, unspecified: Secondary | ICD-10-CM | POA: Diagnosis not present

## 2016-09-17 LAB — GLUCOSE, CAPILLARY
GLUCOSE-CAPILLARY: 119 mg/dL — AB (ref 65–99)
Glucose-Capillary: 98 mg/dL (ref 65–99)
Glucose-Capillary: 184 mg/dL — ABNORMAL HIGH (ref 65–99)
Glucose-Capillary: 142 mg/dL — ABNORMAL HIGH (ref 65–99)

## 2016-09-17 LAB — PROTIME-INR
INR: 2.13
PROTHROMBIN TIME: 24.2 s — AB (ref 11.4–15.2)

## 2016-09-17 MED ORDER — AMLODIPINE BESYLATE 10 MG PO TABS
10.0000 mg | ORAL_TABLET | Freq: Every day | ORAL | Status: DC
  Administered 2016-09-17 – 2016-09-26 (×10): 10 mg via ORAL
  Filled 2016-09-17 (×10): qty 1

## 2016-09-17 NOTE — Progress Notes (Addendum)
CSW inquiring with [name]-She plans to follow up with CSW  12:00pm-1:00pm today. No return call. Unable to reach.  [facility]-Waiting for follow up.   Patient friend Albertina Parr came to visit patient. Patient gave csw permission to talk with her. She is unable to take patient home with her at this time.   CSW called Garen Lah w/ [facility]. She plans to visit, interview patient tomorrow for potential placement.   Vivi Barrack, Theresia Majors, MSW Clinical Social Worker 5E and Psychiatric Service Line [phone] 09/17/2016  9:01 AM

## 2016-09-17 NOTE — Progress Notes (Signed)
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  Assessment: 42 yoF, CVA May/18, living with friend after rehab. To ED reportedly "needing her INR checked". Will need placement at discharge  Today, 09/17/2016  INR 2.13 therapeutic after dose held x 2 days then 10 mg yesterday. H/H wnl, Plt sl decreased, 149K (on 8/19 and 8/20), no bleeding reported  Goal of Therapy:  INR 2-3 Monitor platelets by anticoagulation protocol: Yes   Plan:   continue home dose of 10 mg daily  Daily PT/INR  Herby Abraham, Pharm.D. [phone] 09/17/2016 11:52 AM
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
## 2016-09-18 LAB — BASIC METABOLIC PANEL
ANION GAP: 10 (ref 5–15)
BUN: 18 mg/dL (ref 6–20)
CHLORIDE: 109 mmol/L (ref 101–111)
CO2: 22 mmol/L (ref 22–32)
Calcium: 9.3 mg/dL (ref 8.9–10.3)
Creatinine, Ser: 1.06 mg/dL — ABNORMAL HIGH (ref 0.44–1.00)
GFR calc Af Amer: >60 mL/min (ref >60)
GFR, EST NON AFRICAN AMERICAN: 54 mL/min — AB (ref >60)
Glucose, Bld: 135 mg/dL — ABNORMAL HIGH (ref 65–99)
POTASSIUM: 3.8 mmol/L (ref 3.5–5.1)
SODIUM: 141 mmol/L (ref 135–145)

## 2016-09-18 LAB — GLUCOSE, CAPILLARY
GLUCOSE-CAPILLARY: 125 mg/dL — AB (ref 65–99)
GLUCOSE-CAPILLARY: 101 mg/dL — AB (ref 65–99)
GLUCOSE-CAPILLARY: 138 mg/dL — AB (ref 65–99)
GLUCOSE-CAPILLARY: 137 mg/dL — AB (ref 65–99)

## 2016-09-18 LAB — PROTIME-INR
INR: 2.21
Prothrombin Time: 24.9 seconds — ABNORMAL HIGH (ref 11.4–15.2)
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
## 2016-09-18 NOTE — Progress Notes (Signed)
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  Assessment: 22 yoF, CVA May/18, living with friend after rehab. To ED reportedly "needing her INR checked". Will need placement at discharge  Today, 09/18/2016  INR 2.21 therapeutic.  H/H wnl on 8/20, Plt sl decreased, 149K (on 8/19 and 8/20), no bleeding reported  Goal of Therapy:  INR 2-3 Monitor platelets by anticoagulation protocol: Yes   Plan:   continue home dose of 10 mg daily  Daily PT/INR  Herby Abraham, Pharm.D. [phone] 09/18/2016 9:56 AM
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
## 2016-09-18 NOTE — Progress Notes (Signed)
CSW and Family [facility] staff director Dellie Catholic,  Assistant director  Lequita Asal met with patient at bedside, explain role and reason for visit. Patient receptive to visitors. Family Care home explain their services. The Care home is requesting to know patient medication will be paid before extending offer.  CSW informed HCPOA, and provided contact information to follow up.   CSW following to assist with discharge needs.    Kathrin Greathouse, Latanya Presser, MSW Clinical Social Worker 5E and Psychiatric Service Line [phone] 09/18/2016  3:25 PM

## 2016-09-19 LAB — GLUCOSE, CAPILLARY
Glucose-Capillary: 113 mg/dL — ABNORMAL HIGH (ref 65–99)
Glucose-Capillary: 236 mg/dL — ABNORMAL HIGH (ref 65–99)
Glucose-Capillary: 173 mg/dL — ABNORMAL HIGH (ref 65–99)

## 2016-09-19 LAB — PROTIME-INR
INR: 2.42
Prothrombin Time: 26.7 seconds — ABNORMAL HIGH (ref 11.4–15.2)
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
## 2016-09-19 NOTE — Progress Notes (Signed)
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  Assessment: 64 yoF, CVA May/18, living with friend after rehab. To ED reportedly "needing her INR checked". Will need placement at discharge  Today, 09/19/2016  INR 2.42 therapeutic on home dose of 10 mg daily H/H wnl on 8/20, Plt sl decreased, 149K (on 8/19 and 8/20), no bleeding reported  Goal of Therapy:  INR 2-3 Monitor platelets by anticoagulation protocol: Yes   Plan:   continue home dose of 10 mg daily  Decrease daily PT/INR to MWF only  Herby Abraham, Pharm.D. [phone] 09/19/2016 8:40 AM

## 2016-09-19 NOTE — Progress Notes (Addendum)
[facility]   No new events today, patient was seen and chart was reviewed. Patient again doesn't remember me from the day before. Patient with severe dementia. She is medically stable although we'll awaiting for safe discharge. Patient cannot be home alone as it is dangerous for her health. CSW working on placement.  Patient can be discharged as soon she has a safe place to go.  Addendum  Apparently patient refusing to go to family home care facility and there is no POA assigned. Psych consulted to evaluate for capacity.   Latrelle Dodrill, MD

## 2016-09-20 DIAGNOSIS — R413 Other amnesia: Secondary | ICD-10-CM

## 2016-09-20 DIAGNOSIS — E1165 Type 2 diabetes mellitus with hyperglycemia: Secondary | ICD-10-CM | POA: Diagnosis not present

## 2016-09-20 DIAGNOSIS — N179 Acute kidney failure, unspecified: Secondary | ICD-10-CM

## 2016-09-20 DIAGNOSIS — Z811 Family history of alcohol abuse and dependence: Secondary | ICD-10-CM

## 2016-09-20 DIAGNOSIS — E039 Hypothyroidism, unspecified: Secondary | ICD-10-CM

## 2016-09-20 DIAGNOSIS — E1110 Type 2 diabetes mellitus with ketoacidosis without coma: Secondary | ICD-10-CM

## 2016-09-20 DIAGNOSIS — E785 Hyperlipidemia, unspecified: Secondary | ICD-10-CM

## 2016-09-20 DIAGNOSIS — Z87891 Personal history of nicotine dependence: Secondary | ICD-10-CM

## 2016-09-20 DIAGNOSIS — I10 Essential (primary) hypertension: Secondary | ICD-10-CM

## 2016-09-20 DIAGNOSIS — F0151 Vascular dementia with behavioral disturbance: Secondary | ICD-10-CM | POA: Diagnosis not present

## 2016-09-20 LAB — GLUCOSE, CAPILLARY
GLUCOSE-CAPILLARY: 110 mg/dL — AB (ref 65–99)
Glucose-Capillary: 92 mg/dL (ref 65–99)
Glucose-Capillary: 141 mg/dL — ABNORMAL HIGH (ref 65–99)
Glucose-Capillary: 113 mg/dL — ABNORMAL HIGH (ref 65–99)

## 2016-09-20 NOTE — Progress Notes (Signed)
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  Assessment: 68 yoF, CVA May/18, living with friend after rehab. To ED reportedly "needing her INR checked". Will need placement at discharge  Today, 09/20/2016  INR 2.42 therapeutic on 8/24 on home dose of 10 mg daily H/H wnl on 8/20, Plt sl decreased, 149K (on 8/19 and 8/20), no bleeding reported  Goal of Therapy:  INR 2-3 Monitor platelets by anticoagulation protocol: Yes   Plan:   Continue home dose of warfarin 10 mg daily  Continue PT/INR on MWF only   Hessie Knows, PharmD, BCPS Pager [phone] 09/20/2016 8:44 AM

## 2016-09-20 NOTE — Progress Notes (Signed)
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 On evaluation in emergency department she was noted to to have AKI with creatinine of 2.69, INR of 2.24. Given kidney function patient was admitted for hydration. Also social worker was consulted due to unsafe discharge and social issues the patient cannot live alone and take care of her self. Patient was treated with IV fluids and now her labs have normalize. Patient now medically stable for discharge although unsafe discharge as patient cannot care for herself due to her sever dementia.   Patient has no complaints today.   O: Blood pressure 115/84, pulse 63, temperature 98.7 F (37.1 C), temperature source Oral, resp. rate 18, height 5\' 5"  (1.651 m), weight 98.4 kg (217 lb), SpO2 94 %. Gen: NAD, pleasant  Neuro: AAOx 3 no focal findings  Psych: Mood and affect normal   A&P  This 66 year old female with severe vascular dementia, on chronic anticoagulation and multiple medical comorbidities who was admitted for acute renal failure and now awaiting for safe discharge. Patient has no close family around and no one that can help her with her daily activities.  Patient was evaluated by psychiatry who deemed she does not have capacity to make informed decision due to severe dementia. I agree with this as I evaluated the patient and she did not have good insight of her own medical problems. She is very forgetful and she couldn't be in danger living by himself.  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 She does not have a POA, family live in [place]. Will contact SW to help with this issues.   Latrelle Dodrill, MD

## 2016-09-20 NOTE — Consult Note (Signed)
[facility] Face-to-Face Psychiatry Consult   Reason for Consult:  Capacity determination Referring Physician:  Dr. Edward Jolly Patient Identification: [name] MRN:  [id number] Principal Diagnosis: Dementia with behavioral disturbance Diagnosis:   Patient Active Problem List   Diagnosis Date Noted  . ARF (acute renal failure) (HCC) [N17.9] 09/13/2016  . Hyperglycemia due to type 2 diabetes mellitus (HCC) [E11.65] 09/13/2016  . DKA, type 2 (HCC) [E11.10] 09/12/2016  . 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 Dyslipidemia associated with type 2 diabetes mellitus (HCC) [E11.69, E78.5] 06/06/2016  . Depression with anxiety [F41.8] 06/06/2016  . Thrombocytopenia (HCC) [D69.6]   . Sundowning [F05]   . Non-compliance [Z91.19]   . Chronic obstructive pulmonary disease (HCC) [J44.9]   . Benign essential HTN [I10]   . History of CVA (cerebrovascular accident) [Z86.73]   . Dementia with behavioral disturbance [F03.91]   . Occipital stroke (HCC) [I63.9]   . 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 Chronic anticoagulation [Z79.01]   . Vertigo [R42]   . AKI (acute kidney injury) (HCC) [N17.9] 06/28/2015  . Protein-calorie malnutrition, moderate (HCC) [E44.0] 06/28/2015  . Acute CVA (cerebrovascular accident) (HCC) [I63.9] 06/28/2015  . Hypokalemia [E87.6]   . Cerebellar stroke (HCC) [I63.9]   . Emesis [R11.10]   . Anxiety [F41.9] 06/27/2015  . Dizziness [R42] 06/27/2015  . Tobacco abuse [Z72.0]   . Early onset Alzheimer's dementia without behavioral disturbance [G30.0, F02.80] 11/03/2014  . Memory deficits [R41.3] 07/03/2014  . Trigger finger, acquired [M65.30] 09/13/2012  . Pain in both wrists [M25.531, M25.532] 05/12/2012  . Non-proliferative diabetic retinopathy, both eyes (HCC) [E11.3293] 05/07/2012  . Colon cancer screening [Z12.11] 12/23/2011  . Unintentional weight loss [R63.4] 12/22/2011  . Memory problem [R41.3] 10/31/2011  . Idiopathic acute  pancreatitis [K85.00] 10/29/2011  . Major depressive disorder [F32.9] 09/10/2011  . Hypertension [I10] 09/10/2011  . Chronic back pain [M54.9, G89.29] 09/10/2011  . Hyperlipidemia [E78.5] 09/10/2011  . Type II diabetes mellitus with neurological manifestations (HCC) [E11.49] 09/10/2011    Total Time spent with patient: 45 minutes  Subjective:  ''I am crazy, I don't need a psychiatrist, I just need you to find me someone who can live with me in my home.''  [name] is a 66 y.o. female patient admitted with was drop at the emergency department and need her Coumadin levels checked.  Subjective: Thanks for asking me to do a capacity evaluation on [name],  a 66 year old woman who reports history of Vascular Dementia, HTN, HLD, DM type II, Hypothyroid, CVA on chronic anticoagulation. Patient is a poor historian but reports that she came to the hospital because she could no longer live with her friend who took her in after she was discharged from rehabilitation center.  Patient has been offered placement outside her home but refused, she insisted on being placed in her home with someone to live with her. Patient is very forgetful and she score 15/30 on Mini mental status examination. Patient will not be able to live alone in her home and does not have a full understanding of the potential danger associates with living alone. She has difficulty processing information due to Dementia. As a result, patient does not have capacity to make decision. She denies feeling depressed, anxious, psychotic or delusional.   Risk to Self: Is patient at risk for suicide?: No Risk to Others:   Prior Inpatient Therapy:   Prior Outpatient Therapy:    Past Medical History:  Past Medical History:  Diagnosis Date  . COPD (chronic obstructive pulmonary disease) (HCC)    on CT scan 06/28/2015  . Dementia   . Diabetes mellitus   . Hypertension   . Hypothyroidism   . Idiopathic acute pancreatitis 09/16/2011  .  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 Memory loss   . Pancreatitis 2010  . Tobacco abuse   . Trigger finger     Past Surgical History:  Procedure Laterality Date  . BACK SURGERY    . CHOLECYSTECTOMY  2003  . CYST REMOVAL HAND Left 12/11/2014   Procedure: LEFT RING FINGER TENDON SHEATH CYST EXCISION;  Surgeon: Mack Hook, MD;  Location:  SURGERY CENTER;  Service: Orthopedics;  Laterality: Left;  . SPINE SURGERY  2006   Dr. Trey Sailors - L3-L4, L4-L5 laminotomy and foraminotomy  . TEE WITHOUT CARDIOVERSION N/A 07/03/2015   Procedure: TRANSESOPHAGEAL ECHOCARDIOGRAM (TEE);  Surgeon: Quintella Reichert, MD;  Location: [facility] ENDOSCOPY;  Service: Cardiovascular;  Laterality: N/A;   Family History:  Family History  Problem Relation Age of Onset  . Alcohol abuse Father   . Cancer Mother    Family Psychiatric  History: This denied family history of mental illness.  Social History:  History  Alcohol Use No     History  Drug Use No    Social History   Social History  . Marital status: Single    Spouse name: N/A  . Number of children: N/A  . Years of education: N/A   Social History Main Topics  . Smoking status: Former Smoker    Packs/day: 0.50    Types: Cigarettes    Start date: 07/01/2015  . Smokeless tobacco: Never Used     Comment: still in stress. looking for job.   . Alcohol use No  . Drug use: No  . Sexual activity: Not Asked   Other Topics Concern  . None   Social History Narrative   Lives with boyfriend.  No children.  Retired from [employer].  Education: 2 years of college.   Additional Social History:    Allergies:  No Known Allergies  Labs:  Results for orders placed or performed during the hospital encounter of 09/12/16 (from the past 48 hour(s))  Glucose, capillary     Status: Abnormal   Collection Time: 09/18/16  4:52 PM  Result Value Ref Range   Glucose-Capillary 138 (H) 65 - 99 mg/dL  Glucose, capillary     Status: Abnormal   Collection  Time: 09/18/16  9:38 PM  Result Value Ref Range   Glucose-Capillary 137 (H) 65 - 99 mg/dL  Protime-INR     Status: Abnormal   Collection Time: 09/19/16  6:33 AM  Result Value Ref Range   Prothrombin Time 26.7 (H) 11.4 - 15.2 seconds   INR 2.42   Glucose, capillary     Status: Abnormal   Collection Time: 09/19/16  7:51 AM  Result Value Ref Range   Glucose-Capillary 113 (H) 65 - 99 mg/dL  Glucose, capillary     Status: Abnormal   Collection Time: 09/19/16 11:50 AM  Result Value Ref Range   Glucose-Capillary 236 (H) 65 - 99 mg/dL  Glucose, capillary     Status: Abnormal   Collection Time: 09/19/16  4:58 PM  Result Value Ref Range   Glucose-Capillary 173 (H) 65 - 99 mg/dL  Glucose, capillary     Status: None   Collection Time: 09/20/16  8:07 AM  Result Value Ref Range  Glucose-Capillary 92 65 - 99 mg/dL   Comment 1 Notify RN   Glucose, capillary     Status: Abnormal   Collection Time: 09/20/16 12:25 PM  Result Value Ref Range   Glucose-Capillary 110 (H) 65 - 99 mg/dL   Comment 1 Notify RN     Current Facility-Administered Medications  Medication Dose Route Frequency Provider Last Rate Last Dose  . acetaminophen (TYLENOL) tablet 650 mg  650 mg Oral Q6H PRN Clydie Braun, MD       Or  . acetaminophen (TYLENOL) suppository 650 mg  650 mg Rectal Q6H PRN Smith, Rondell A, MD      . albuterol (PROVENTIL) (2.5 MG/3ML) 0.083% nebulizer solution 2.5 mg  2.5 mg Nebulization Q4H PRN Katrinka Blazing, Rondell A, MD      . amLODipine (NORVASC) tablet 10 mg  10 mg Oral Daily Randel Pigg, Dorma Russell, MD   10 mg at 09/20/16 [phone]  . atorvastatin (LIPITOR) tablet 40 mg  40 mg Oral Daily Madelyn Flavors A, MD   40 mg at 09/20/16 0917  . donepezil (ARICEPT) tablet 10 mg  10 mg Oral QHS Madelyn Flavors A, MD   10 mg at 09/19/16 2158  . insulin aspart (novoLOG) injection 0-9 Units  0-9 Units Subcutaneous TID WC Clydie Braun, MD   2 Units at 09/19/16 1733  . levothyroxine (SYNTHROID, LEVOTHROID) tablet 75 mcg   75 mcg Oral QAC breakfast Madelyn Flavors A, MD   75 mcg at 09/20/16 0917  . MEDLINE mouth rinse  15 mL Mouth Rinse BID Regalado, Belkys A, MD   15 mL at 09/20/16 0918  . mirtazapine (REMERON) tablet 15 mg  15 mg Oral QHS Smith, Rondell A, MD   15 mg at 09/19/16 2158  . ondansetron (ZOFRAN) tablet 4 mg  4 mg Oral Q6H PRN Madelyn Flavors A, MD       Or  . ondansetron (ZOFRAN) injection 4 mg  4 mg Intravenous Q6H PRN Smith, Rondell A, MD      . sertraline (ZOLOFT) tablet 100 mg  100 mg Oral BID Madelyn Flavors A, MD   100 mg at 09/20/16 0917  . warfarin (COUMADIN) tablet 10 mg  10 mg Oral q1800 Herby Abraham, RPH   10 mg at 09/19/16 1733  . Warfarin - Pharmacist Dosing Inpatient   Does not apply q1800 Clydie Braun, MD   Stopped at 09/14/16 1800    Musculoskeletal: Strength & Muscle Tone: within normal limits Gait & Station: unable to stand Patient leans: N/A  Psychiatric Specialty Exam: Physical Exam  Psychiatric: She has a normal mood and affect. Her speech is normal and behavior is normal. Judgment and thought content normal. Cognition and memory are impaired.   as per history and physical   Review of Systems  Constitutional: Negative.   HENT: Negative.   Eyes: Negative.   Respiratory: Negative.   Cardiovascular: Negative.   Gastrointestinal: Negative.   Genitourinary: Negative.   Musculoskeletal: Negative.   Neurological: Negative.   Endo/Heme/Allergies: Negative.   Psychiatric/Behavioral: Positive for memory loss.   memory deficits, poor orientation and concentration.  Denied nausea, vomiting, abdomen pain, shortness of breath adjustment. No Fever-chills, No Headache, No changes with Vision or hearing, reports vertigo No problems swallowing food or Liquids, No Chest pain, Cough or Shortness of Breath, No Abdominal pain, No Nausea or Vommitting, Bowel movements are regular, No Blood in stool or Urine, No dysuria, No new skin rashes or bruises, No new joints pains-aches,  No new weakness, tingling, numbness in any extremity, No recent weight gain or loss, No polyuria, polydypsia or polyphagia,  A full 10 point Review of Systems was done, except as stated above, all other Review of Systems were negative.  Blood pressure 139/80, pulse 68, temperature (!) 97.5 F (36.4 C), temperature source Oral, resp. rate 20, height 5\' 5"  (1.651 m), weight 98.4 kg (217 lb), SpO2 96 %.Body mass index is 36.11 kg/m.  General Appearance: Casual  Eye Contact:  Good  Speech:  Clear and Coherent  Volume:  Normal  Mood:  Euthymic  Affect:  Appropriate and Congruent  Thought Process:  Coherent and Goal Directed  Orientation:  Full (Time, Place, and Person)  Thought Content:  Patient knows her first name, last name and date of birth but does not recall need of current hospitalization other than stroke at least twice in the past.  Suicidal Thoughts:  No  Homicidal Thoughts:  No  Memory:  Immediate;   Fair Recent;   Poor Remote;   Good  Judgement:  Fair  Insight:  Shallow  Psychomotor Activity:  Decreased  Concentration:  Concentration: Fair and Attention Span: Poor  Recall:  Poor  Fund of Knowledge:  Fair  Language:  Good  Akathisia:  Negative  Handed:  Right  AIMS (if indicated):     Assets:  Communication Skills Desire for Improvement Financial Resources/Insurance Housing Leisure Time  ADL's:  Impaired  Cognition:  Impaired,  Moderate  Sleep:        Treatment Plan Summary: Patient does not have capacity to make inform decision due to severity of her Dementia.  Recommendation: Patient will benefit from placement in  skilled nursing facility placement as she cannot care for herself due to Dementia and multiple medical problems.  Disposition: No evidence of imminent risk to self or others at present.   Patient does not meet criteria for psychiatric inpatient admission. Supportive therapy provided about ongoing stressors.  Thedore Mins, MD 09/20/2016  1:54 PM

## 2016-09-21 LAB — GLUCOSE, CAPILLARY
GLUCOSE-CAPILLARY: 99 mg/dL (ref 65–99)
GLUCOSE-CAPILLARY: 157 mg/dL — AB (ref 65–99)
GLUCOSE-CAPILLARY: 99 mg/dL (ref 65–99)
Glucose-Capillary: 113 mg/dL — ABNORMAL HIGH (ref 65–99)

## 2016-09-21 NOTE — Progress Notes (Addendum)
[facility]   No new events today, patient's was seen and chart was reviewed. Again patient does not remember who I am, and we have spoke every day for the past 5 days. Patient with severe dementia and she is medically stable. She is deemed not to have capacity to make her own medical decisions due to severe dementia. She continues to insist to go home and refusing to go to home care facility. Social worker input appreciated.  Patient can be discharged as she has a safe place to go, recommended SNF or memory unit.  Latrelle Dodrill, MD

## 2016-09-21 NOTE — Progress Notes (Signed)
CSW following to facilitate discharge planning. Patient has been deemed to not have capacity to make medical decisions, but is still refusing to go to [facility]. Patient is agitated during discussion and wants to go home.   CSW Chiropodist is following to facilitate safe discharge to a locked dementia unit when a bed becomes available. CSW to follow up with updates when available.  Blenda Nicely, [place] Clinical Social Worker [phone]

## 2016-09-21 NOTE — Progress Notes (Signed)
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  Assessment: 25 yoF, CVA May/18, living with friend after rehab. To ED reportedly "needing her INR checked". Will need placement at discharge  Today, 09/21/2016  INR 2.42 therapeutic on 8/24 on home dose of 10 mg daily H/H wnl on 8/20, Plt sl decreased, 149K (on 8/19 and 8/20), no bleeding reported  Goal of Therapy:  INR 2-3 Monitor platelets by anticoagulation protocol: Yes   Plan:   Continue home dose of warfarin 10 mg daily  Continue PT/INR on MWF only   Hessie Knows, PharmD, BCPS Pager [phone] 09/21/2016 9:02 AM

## 2016-09-22 LAB — GLUCOSE, CAPILLARY
GLUCOSE-CAPILLARY: 107 mg/dL — AB (ref 65–99)
GLUCOSE-CAPILLARY: 177 mg/dL — AB (ref 65–99)
Glucose-Capillary: 104 mg/dL — ABNORMAL HIGH (ref 65–99)
Glucose-Capillary: 152 mg/dL — ABNORMAL HIGH (ref 65–99)

## 2016-09-22 LAB — PROTIME-INR
INR: 3.68
PROTHROMBIN TIME: 37.5 s — AB (ref 11.4–15.2)

## 2016-09-22 NOTE — Progress Notes (Signed)
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  Assessment: 12 yoF, CVA May/18, living with friend after rehab. To ED reportedly "needing her INR checked". Will need placement at discharge   Baseline INR WNL  Prior anticoagulation: Warfarin 10 mg daily  Significant events:  Today, 09/22/2016:  CBC: last checked 8/20 (Hgb/Plt borderline low)  INR now SUPRAtherapeutic  Major drug interactions: none noted  No bleeding issues per nursing  Eating 50-100% of meals  Goal of Therapy: INR 2-3  Plan:  Hold warfarin today  Will switch to daily INR  CBC at least q72 hr while on warfarin, will not recheck if discharge imminent.   If discharging today, would hold warfarin for 1-2 more days and resume at home dose with INR recheck in 3-5 days  Monitor for signs of bleeding or thrombosis   Bernadene Person, PharmD Pager: [phone] 09/22/2016, 8:28 AM

## 2016-09-22 NOTE — Progress Notes (Addendum)
[facility] Patient seen and chart reviewed, Patient remains very forgetful. Ongoing search for safe placement by Child psychotherapist. Patient does not have capacity to make her own medical decisions, social worker has contacted HCPOA for further decisions.  INR is supratherapeutic today We will hold warfarin for tonight, no signs of breathing. Check INR in the morning.  Patient condition is some as she has a safe place to go, memory unit  or SNF will be appropriate.  Latrelle Dodrill, MD

## 2016-09-22 NOTE — Progress Notes (Addendum)
Patient does not have capacity to make decisions for self.  CSW following for discharge needs, csw called patient cousin(refers to him as nephew) a to receive HCPOA. Waiting for return call.   CSW Warehouse manager provided ALF potential for [facility]. CSW spoke with Kim-AC at facility provided overview of patient. CSW  faxed clinical information to [phone].  Kim at [facility] is still reviewing clinical information,  Patient PASRR Submitted fo ALF.  CSW spoke to patient cousin [name] reports he does not have HCPOA Just POA. Patient mother-[name] ( patient first cousin) is the next of Kin to make decision for patient. CSW waiting for return call.   Vivi Barrack, Theresia Majors, MSW Clinical Social Worker 5E and Psychiatric Service Line [phone] 09/22/2016  10:44 AM

## 2016-09-23 DIAGNOSIS — E1165 Type 2 diabetes mellitus with hyperglycemia: Secondary | ICD-10-CM | POA: Diagnosis not present

## 2016-09-23 DIAGNOSIS — N179 Acute kidney failure, unspecified: Secondary | ICD-10-CM | POA: Diagnosis not present

## 2016-09-23 DIAGNOSIS — F0151 Vascular dementia with behavioral disturbance: Secondary | ICD-10-CM | POA: Diagnosis not present

## 2016-09-23 DIAGNOSIS — I10 Essential (primary) hypertension: Secondary | ICD-10-CM | POA: Diagnosis not present

## 2016-09-23 LAB — QUANTIFERON IN TUBE
QFT TB AG MINUS NIL VALUE: 0.47 [IU]/mL
QUANTIFERON MITOGEN VALUE: 4.84 IU/mL
QUANTIFERON TB AG VALUE: 0.5 [IU]/mL
QUANTIFERON TB GOLD: POSITIVE — AB
Quantiferon Nil Value: 0.03 IU/mL

## 2016-09-23 LAB — QUANTIFERON TB GOLD ASSAY (BLOOD)

## 2016-09-23 LAB — GLUCOSE, CAPILLARY
GLUCOSE-CAPILLARY: 89 mg/dL (ref 65–99)
GLUCOSE-CAPILLARY: 167 mg/dL — AB (ref 65–99)
GLUCOSE-CAPILLARY: 171 mg/dL — AB (ref 65–99)
GLUCOSE-CAPILLARY: 115 mg/dL — AB (ref 65–99)

## 2016-09-23 LAB — PROTIME-INR
INR: 3.55
Prothrombin Time: 36.4 seconds — ABNORMAL HIGH (ref 11.4–15.2)
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
## 2016-09-23 NOTE — Progress Notes (Signed)
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  Assessment: 65 yoF, CVA May/18, living with friend after rehab. To ED reportedly "needing her INR checked". Will need placement at discharge   Baseline INR WNL  Prior anticoagulation: Warfarin 10 mg daily  Significant events:  Today, 09/23/2016:  CBC: last checked 8/20 (Hgb/Plt borderline low)  INR SUPRAtherapeutic but trending back down  Major drug interactions: none noted  No bleeding issues per nursing  Eating 50-100% of meals  Goal of Therapy: INR 2-3  Plan:  Hold warfarin today  Continue daily INR while supratherapeutic  CBC tomorrow (minimum of weekly monitoring while on warfarin, last checked 9 days ago)  If discharging today, would hold warfarin for 1-2 more days and resume at home dose with INR recheck in 3-5 days  Monitor for signs of bleeding or thrombosis   Bernadene Person, PharmD Pager: [phone] 09/23/2016, 10:21 AM

## 2016-09-23 NOTE — Progress Notes (Signed)
CSW spoke with [name] at ALF, at this time they are still reviewing.   CSW spoke to patient cousin [name] about making decision for the patient. She states at this time she does not feel comfortable with making decisions for the patient, she is still grieving her spouse. She prefers patient cousin [name] make decision for her. CSW will continue to assist with placement.   Vivi Barrack, Theresia Majors, MSW Clinical Social Worker 5E and Psychiatric Service Line [phone] 09/23/2016  4:02 PM
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
## 2016-09-23 NOTE — Progress Notes (Signed)
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 On evaluation in emergency department she was noted to to have AKI with creatinine of 2.69, INR of 2.24. Given kidney function patient was admitted for hydration. Also social worker was consulted due to unsafe discharge and social issues the patient cannot live alone and take care of her self. Patient was treated with IV fluids and now her labs have normalize. Patient now medically stable for discharge although unsafe discharge as patient cannot care for herself due to her sever dementia  Subjective: No new complaints today. Patient today remembers who I am. INR supretherapeutic. Denies any gross bleeding that she remembers.   Assessment & Plan: This 66 year old female with severe vascular dementia, on chronic anticoagulation and multiple medical comorbidities who was admitted for acute renal failure and now awaiting for safe discharge. Patient has no close family around and no one that can help her with her daily activities.  Patient was evaluated by psychiatry who deemed she does not have capacity to make informed decision due to severe dementia. I agree with this as I evaluated the patient and she did not have good insight of her own medical problems. She is very forgetful and could be in danger living by her self.   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 No overt bleeding noted   Social worker working on placement, difficult situation as patient does not have family in [place]. Apparently family live in [place], but unsure of involvement.   DVT prophylaxis: Warfarin - on hold  Code Status: FULL  Family Communication: None at bedside  Disposition Plan: Pending placement   Consultants:   Psychiatry  Procedures:   None  Antimicrobials: Anti-infectives    None       Objective: Vitals:   09/23/16 0557 09/23/16 0739 09/23/16 1007 09/23/16 1300  BP: (!) 142/84 (!) 131/92 122/74 135/81  Pulse: 61 61  (!) 55  Resp: 20 16  18   Temp: 98.4 F (36.9 C) 98.4 F (36.9 C)  98.3 F (36.8 C)  TempSrc: Oral Oral  Oral  SpO2: 95% 90%  98%  Weight:      Height:        Intake/Output Summary (Last 24 hours) at 09/23/16 1602 Last data filed at 09/23/16 1036  Gross per 24 hour  Intake              240 ml  Output             1000 ml  Net             -760 ml   Filed Weights   09/12/16 1947 09/13/16 2038  Weight: 98.4 kg (217 lb) 98.4 kg (217 lb)    Examination:  General: Pt is alert, awake, not in acute distress Cardiovascular: RRR, S1/S2 +, no rubs, no gallops Respiratory: CTA bilaterally, no wheezing, no rhonchi Abdominal:  Soft, NT, ND, bowel sounds + Extremities: no edema Neuro: AAOx 3, no focal   Data Reviewed: I have personally reviewed following labs and imaging studies  CBC: No results for input(s): WBC, NEUTROABS, HGB, HCT, MCV, PLT in the last 168 hours. Basic Metabolic Panel:  Recent Labs Lab 09/18/16 0851  NA 141  K 3.8  CL 109  CO2 22  GLUCOSE 135*  BUN 18  CREATININE 1.06*  CALCIUM 9.3   GFR: Estimated Creatinine Clearance: 61.5 mL/min (A) (by C-G formula based on SCr of 1.06 mg/dL (H)). Liver Function Tests: No results for input(s): AST, ALT, ALKPHOS, BILITOT, PROT, ALBUMIN in the last  168 hours. No results for input(s): LIPASE, AMYLASE in the last 168 hours. No results for input(s): AMMONIA in the last 168 hours. Coagulation Profile:  Recent Labs Lab 09/17/16 0817 09/18/16 0851 09/19/16 0633 09/22/16 0521 09/23/16 0544  INR 2.13 2.21 2.42 3.68 3.55   Cardiac Enzymes: No results for input(s): CKTOTAL, CKMB, CKMBINDEX, TROPONINI in the last 168 hours. BNP (last 3 results) No results for input(s): PROBNP in the last 8760 hours. HbA1C: No results for input(s): HGBA1C in the last 72 hours. CBG:  Recent Labs Lab 09/22/16 1140 09/22/16 1704 09/22/16 2114 09/23/16 0739 09/23/16 1154  GLUCAP 177* 104* 152* 89 167*   Lipid Profile: No results for input(s): CHOL, HDL, LDLCALC, TRIG, CHOLHDL, LDLDIRECT in the last 72 hours. Thyroid Function Tests: No results for input(s): TSH, T4TOTAL, FREET4, T3FREE, THYROIDAB in the last 72 hours. Anemia Panel: No results for input(s): VITAMINB12, FOLATE, FERRITIN, TIBC, IRON, RETICCTPCT in the last 72 hours. Sepsis Labs: No results for input(s): PROCALCITON, LATICACIDVEN in the last 168 hours.  No results found for this or any previous visit (from the past 240 hour(s)).    Radiology Studies: No results found.    Scheduled Meds: . amLODipine  10 mg Oral Daily  . atorvastatin  40 mg Oral Daily  . donepezil  10 mg Oral QHS  . insulin aspart  0-9 Units Subcutaneous TID WC  . levothyroxine  75 mcg Oral QAC breakfast  . mouth rinse  15 mL Mouth Rinse BID  . mirtazapine  15 mg Oral QHS  . sertraline  100 mg Oral BID  . Warfarin - Pharmacist Dosing Inpatient   Does not apply q1800   Continuous Infusions:   LOS: 1 day    Time spent: Total of 15 minutes spent with pt, greater than 50% of which was spent in discussion of  treatment, counseling and coordination of care    Latrelle Dodrill, MD Pager: Text Page via [email or web address]   If 7PM-7AM, please contact night-coverage [email or web address] 09/23/2016, 4:02 PM

## 2016-09-24 DIAGNOSIS — E034 Atrophy of thyroid (acquired): Secondary | ICD-10-CM | POA: Diagnosis not present

## 2016-09-24 DIAGNOSIS — E784 Other hyperlipidemia: Secondary | ICD-10-CM

## 2016-09-24 DIAGNOSIS — R739 Hyperglycemia, unspecified: Secondary | ICD-10-CM

## 2016-09-24 DIAGNOSIS — I358 Other nonrheumatic aortic valve disorders: Secondary | ICD-10-CM | POA: Diagnosis not present

## 2016-09-24 DIAGNOSIS — I10 Essential (primary) hypertension: Secondary | ICD-10-CM | POA: Diagnosis not present

## 2016-09-24 DIAGNOSIS — Z7901 Long term (current) use of anticoagulants: Secondary | ICD-10-CM

## 2016-09-24 DIAGNOSIS — N179 Acute kidney failure, unspecified: Secondary | ICD-10-CM | POA: Diagnosis not present

## 2016-09-24 LAB — PROTIME-INR
INR: 2.63
PROTHROMBIN TIME: 27.9 s — AB (ref 11.4–15.2)

## 2016-09-24 LAB — CBC
HCT: 36.5 % (ref 36.0–46.0)
HEMOGLOBIN: 12.1 g/dL (ref 12.0–15.0)
MCH: 27.4 pg (ref 26.0–34.0)
MCHC: 33.2 g/dL (ref 30.0–36.0)
MCV: 82.8 fL (ref 78.0–100.0)
PLATELETS: 189 10*3/uL (ref 150–400)
RBC: 4.41 MIL/uL (ref 3.87–5.11)
RDW: 16.1 % — ABNORMAL HIGH (ref 11.5–15.5)
WBC: 5.4 10*3/uL (ref 4.0–10.5)

## 2016-09-24 LAB — GLUCOSE, CAPILLARY
GLUCOSE-CAPILLARY: 99 mg/dL (ref 65–99)
GLUCOSE-CAPILLARY: 123 mg/dL — AB (ref 65–99)
GLUCOSE-CAPILLARY: 191 mg/dL — AB (ref 65–99)
Glucose-Capillary: 118 mg/dL — ABNORMAL HIGH (ref 65–99)

## 2016-09-24 MED ORDER — WARFARIN SODIUM 5 MG PO TABS
10.0000 mg | ORAL_TABLET | Freq: Once | ORAL | Status: AC
  Administered 2016-09-24: 10 mg via ORAL
  Filled 2016-09-24: qty 2

## 2016-09-24 NOTE — Progress Notes (Addendum)
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  Assessment: [age] yoF, CVA May/18, living with friend after rehab. To ED reportedly "needing her INR checked". Will need placement at discharge   Baseline INR WNL  Prior anticoagulation: Warfarin 10 mg daily  Significant events:  Today, 09/24/2016:  CBC: stable WNL  INR decreased to therapeutic range  Major drug interactions: none noted  No bleeding issues per nursing  Eating 50-100% of meals  Goal of Therapy: INR 2-3  Plan:  Resume warfarin 10 mg PO x 1 tonight  Daily INR  CBC at least weekly  Given recent elevated INRs, consider slight reduction in dose at discharge (i.e. take 1/2 tab or skip dose one day per week). Would recheck INR in 3-5 days to ensure not trending up again.  Monitor for signs of bleeding or thrombosis   Bernadene Personrew Cayden Granholm, PharmD Pager: [phone] 09/24/2016, 12:31 PM

## 2016-09-24 NOTE — Progress Notes (Signed)
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 On evaluation in emergency department she was noted to to have AKI with creatinine of 2.69, INR of 2.24. Given kidney function patient was admitted for hydration. Also social worker was consulted due to unsafe discharge and social issues the patient cannot live alone and take care of her self. Patient was treated with IV fluids and now herlabs havenormalize. 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 Patient presents with a creatinine of 2.69 and BUN of 30. Suspect aspect of dehydration given patient's history poor oral intake and urinary frequency. -Renal function has improved with IVF -Repeat bmet in AM  Type 2 diabetes with hyperglycemia: Acute. Patient presents with elevated glucose 225 on admission  -Continue on SSI coverage  Metabolic acidosis with elevated anion gap: Improved. 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 Patient last suffered an occipital stroke in 05/2016. Patient with multiple risk factors for CVA for which she is on Coumadin and appears to have  therapeutic INR. - Patient continued on anticoagulation   Progressive vascular dementia: Patient presents with confusion. CT scan of the brain was negative for any signs of acute stroke. Patient determined to be unable to care for herself on her own. - SW consulted for placement  Hypothyroidism: Patient's last TSH was 0.62 to approximately 1 month ago.   - Continue levothyroxine as tolerated  Depression and anxiety - Continue Zoloft as tolerated  Essential hypertension - Held lisinopril  - continue amlodipine as pt tolerates  COPD: Remains stable.  Hyperlipidemia - Patient is continued on atorvastatin  DVT prophylaxis: Coumadin Code Status: Full Family Communication: Pt in room, family not at bedside Disposition Plan: Uncertain at this time  Consultants:     Procedures:     Antimicrobials: Anti-infectives    None       Subjective: Without complaints  Objective: Vitals:   09/23/16 1300 09/23/16 2245 09/24/16 0500 09/24/16 1400  BP: 135/81 114/66 (!) 130/98 133/84  Pulse: (!) 55 (!) 58 (!) 50 (!) 59  Resp: 18 20 20 18   Temp: 98.3 F (36.8 C) 98.6 F (37 C) 98.1 F (36.7 C) 97.9 F (36.6 C)  TempSrc: Oral Oral Oral Oral  SpO2: 98% 93% 93% 98%  Weight:      Height:        Intake/Output Summary (Last 24 hours) at 09/24/16 1710 Last data filed at 09/24/16 1641  Gross per 24 hour  Intake              240 ml  Output              300 ml  Net              -  60 ml   Filed Weights   09/12/16 1947 09/13/16 2038  Weight: 98.4 kg (217 lb) 98.4 kg (217 lb)    Examination:  General exam: Appears calm and comfortable  Respiratory system: Clear to auscultation. Respiratory effort normal. Cardiovascular system: S1 & S2 heard, RRR.  Gastrointestinal system: Abdomen is nondistended, soft and nontender. No organomegaly or masses felt. Normal bowel sounds heard. Central nervous system: Alert and oriented. No focal neurological deficits. Extremities:  Symmetric 5 x 5 power. Skin: No rashes, lesions Psychiatry: Judgement and insight appear normal. Mood & affect appropriate.   Data Reviewed: I have personally reviewed following labs and imaging studies  CBC:  Recent Labs Lab 09/24/16 0518  WBC 5.4  HGB 12.1  HCT 36.5  MCV 82.8  PLT 189   Basic Metabolic Panel:  Recent Labs Lab 09/18/16 0851  NA 141  K 3.8  CL 109  CO2 22  GLUCOSE 135*  BUN 18  CREATININE 1.06*  CALCIUM 9.3   GFR: Estimated Creatinine Clearance: 61.5 mL/min (A) (by C-G formula based on SCr of 1.06 mg/dL (H)). Liver Function Tests: No results for input(s): AST, ALT, ALKPHOS, BILITOT, PROT, ALBUMIN in the last 168 hours. No results for input(s): LIPASE, AMYLASE in the last 168 hours. No results for input(s): AMMONIA in the last 168 hours. Coagulation Profile:  Recent Labs Lab 09/18/16 0851 09/19/16 [id number] 09/22/16 0521 09/23/16 0544 09/24/16 0518  INR 2.21 2.42 3.68 3.55 2.63   Cardiac Enzymes: No results for input(s): CKTOTAL, CKMB, CKMBINDEX, TROPONINI in the last 168 hours. BNP (last 3 results) No results for input(s): PROBNP in the last 8760 hours. HbA1C: No results for input(s): HGBA1C in the last 72 hours. CBG:  Recent Labs Lab 09/23/16 1643 09/23/16 2251 09/24/16 0722 09/24/16 1132 09/24/16 1658  GLUCAP 171* 115* 99 123* 118*   Lipid Profile: No results for input(s): CHOL, HDL, LDLCALC, TRIG, CHOLHDL, LDLDIRECT in the last 72 hours. Thyroid Function Tests: No results for input(s): TSH, T4TOTAL, FREET4, T3FREE, THYROIDAB in the last 72 hours. Anemia Panel: No results for input(s): VITAMINB12, FOLATE, FERRITIN, TIBC, IRON, RETICCTPCT in the last 72 hours. Sepsis Labs: No results for input(s): PROCALCITON, LATICACIDVEN in the last 168 hours.  No results found for this or any previous visit (from the past 240 hour(s)).   Radiology Studies: No results found.  Scheduled Meds: . amLODipine  10 mg Oral Daily  . atorvastatin   40 mg Oral Daily  . donepezil  10 mg Oral QHS  . insulin aspart  0-9 Units Subcutaneous TID WC  . levothyroxine  75 mcg Oral QAC breakfast  . mouth rinse  15 mL Mouth Rinse BID  . mirtazapine  15 mg Oral QHS  . sertraline  100 mg Oral BID  . Warfarin - Pharmacist Dosing Inpatient   Does not apply q1800   Continuous Infusions:   LOS: 1 day   CHIU, Scheryl MartenSTEPHEN K, MD [facility] Pager [phone]  If 7PM-7AM, please contact night-coverage [email or web address] Password [email or web address] 09/24/2016, 5:10 PM

## 2016-09-25 ENCOUNTER — Observation Stay (HOSPITAL_COMMUNITY): Payer: Medicare Other

## 2016-09-25 DIAGNOSIS — R739 Hyperglycemia, unspecified: Secondary | ICD-10-CM | POA: Diagnosis not present

## 2016-09-25 DIAGNOSIS — Z7901 Long term (current) use of anticoagulants: Secondary | ICD-10-CM | POA: Diagnosis not present

## 2016-09-25 DIAGNOSIS — I10 Essential (primary) hypertension: Secondary | ICD-10-CM | POA: Diagnosis not present

## 2016-09-25 LAB — BASIC METABOLIC PANEL
ANION GAP: 8 (ref 5–15)
BUN: 16 mg/dL (ref 6–20)
CHLORIDE: 111 mmol/L (ref 101–111)
CO2: 23 mmol/L (ref 22–32)
Calcium: 9 mg/dL (ref 8.9–10.3)
Creatinine, Ser: 0.97 mg/dL (ref 0.44–1.00)
GFR calc Af Amer: >60 mL/min (ref >60)
GFR, EST NON AFRICAN AMERICAN: 60 mL/min — AB (ref >60)
GLUCOSE: 92 mg/dL (ref 65–99)
POTASSIUM: 3.6 mmol/L (ref 3.5–5.1)
Sodium: 142 mmol/L (ref 135–145)

## 2016-09-25 LAB — GLUCOSE, CAPILLARY
GLUCOSE-CAPILLARY: 123 mg/dL — AB (ref 65–99)
GLUCOSE-CAPILLARY: 172 mg/dL — AB (ref 65–99)
Glucose-Capillary: 94 mg/dL (ref 65–99)
Glucose-Capillary: 124 mg/dL — ABNORMAL HIGH (ref 65–99)

## 2016-09-25 LAB — PROTIME-INR
INR: 2.18
Prothrombin Time: 24.1 seconds — ABNORMAL HIGH (ref 11.4–15.2)

## 2016-09-25 MED ORDER — WARFARIN SODIUM 5 MG PO TABS
10.0000 mg | ORAL_TABLET | Freq: Once | ORAL | Status: AC
  Administered 2016-09-25: 10 mg via ORAL
  Filled 2016-09-25: qty 2

## 2016-09-25 NOTE — Progress Notes (Signed)
OT Cancellation Note  Patient Details Name: LAASYA PEYTON MRN: [id number] DOB: 1950/06/09   Cancelled Treatment:    Reason Eval/Treat Not Completed: Other (comment).  Pt met supervision level goals on last visit; this was for safety/gathering all supplies.  Plan is for SNF. Will defer further OT to that venue.  Shevon Sian 09/25/2016, 3:38 PM  Lesle Chris, OTR/L [phone] 09/25/2016

## 2016-09-25 NOTE — Progress Notes (Addendum)
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  Assessment: 89 yoF, CVA May/18, living with friend after rehab. To ED reportedly "needing her INR checked". Will need placement at discharge   Baseline INR WNL  Prior anticoagulation: Warfarin 10 mg daily  Significant events:  Today, 09/25/2016:  CBC: stable WNL as of 8/29  INR now therapeutic but dropping to low end of range  Major drug interactions: none noted  No bleeding issues per nursing  Eating 50-100% of meals  Goal of Therapy: INR 2-3  Plan:  Warfarin 10 mg PO x 1 tonight  Daily INR  CBC at least weekly  Given recent elevated INRs, consider slight reduction in dose at discharge (i.e. take 1/2 tab or skip dose one day per week). Would recheck INR in 3-5 days to ensure not trending up again.  Monitor for signs of bleeding or thrombosis   Bernadene Personrew Kimberle Stanfill, PharmD Pager: [phone] 09/25/2016, 12:10 PM

## 2016-09-25 NOTE — Progress Notes (Signed)
CSW reached out to patient cousin again, he inform CSW with update about patient asset and asked for patient social worker at [employer], CSW provided him contact information.  CSW informed cousin pt. Needs family to assist with finding placement. He reports understanding and plans [place] and try come up with a plan for the patient.  CSW will follow up with him the a.m  Vivi BarrackNicole Bentlee Drier, Theresia MajorsLCSWA, MSW Clinical Social Worker 5E and Psychiatric Service Line [phone] 09/25/2016  4:40 PM

## 2016-09-25 NOTE — Progress Notes (Signed)
   09/25/16 1200  PT Time Calculation  PT Start Time (ACUTE ONLY) 1231  PT G-Codes **NOT FOR INPATIENT CLASS**  Functional Assessment Tool Used Clinical judgement  Mobility: Walking and Moving Around Goal Status [phone]([id number]) [facility]  Mobility: Walking and Moving Around Discharge Status [phone]([id number]) [facility]  Blanchard KelchKaren Dammon Makarewicz PT [phone]

## 2016-09-25 NOTE — Progress Notes (Signed)
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 On evaluation in emergency department she was noted to to have AKI with creatinine of 2.69, INR of 2.24. Given kidney function patient was admitted for hydration. Also social worker was consulted due to unsafe discharge and social issues the patient cannot live alone and take care of her self. Patient was treated with IV fluids and now herlabs havenormalize. 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 Patient presents with a creatinine of 2.69 and BUN of 30. Suspect aspect of dehydration given patient's history poor oral intake and urinary frequency. -Renal function noted to have improved with IVF hydration -Check bmet in AM  Type 2 diabetes with hyperglycemia: Acute. Patient presents with elevated glucose 225 on admission  -Continue with sliding scale as needed  Metabolic acidosis with elevated anion gap: Improved. 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 Patient last suffered an occipital stroke in 05/2016. Patient with multiple risk factors for CVA for which she  is on Coumadin and appears to have therapeutic INR. Patient tolerating anticoagulation. Will continue  Progressive vascular dementia: Patient presents with confusion. CT scan of the brain was negative for any signs of acute stroke. Patient determined to be unable to care for herself on her own. - SW consulted for placement, pending  Hypothyroidism: Patient's last TSH was 0.62 to approximately 1 month ago.   - Will continue with thyroid replacement as tolerated  Depression and anxiety - Plan to continue Zoloft as tolerated  Essential hypertension - Held lisinopril  - continue amlodipine as pt tolerates - Stable at present  COPD: Remains stable. No active wheezing  Hyperlipidemia - Patient is continued on atorvastatin - Stable at this time  Borderline positive quantiferon gold - Have ordered CXR with no signs suggesting active TB infection - Discussed case with ID, Dr. Ninetta Lights, who states borderline result is likely false positive and would require no treatment   DVT prophylaxis: Coumadin Code Status: Full Family Communication: Pt in room, family not at bedside Disposition Plan: Uncertain at this time  Consultants:   Discussed case with ID over phone  Procedures:     Antimicrobials: Anti-infectives    None      Subjective: No complaints this AM  Objective: Vitals:   09/24/16 1400 09/24/16 2106 09/25/16 0442 09/25/16 1332  BP: 133/84 121/68 (!) 141/79 112/67  Pulse: (!) 59 62 60 63  Resp: 18  18 18   Temp: 97.9 F (36.6 C) 98.9 F (37.2 C) 98.2 F (36.8 C) 98.4 F (36.9 C)  TempSrc: Oral Oral Oral Oral  SpO2: 98% 95% 94% 95%  Weight:      Height:  Intake/Output Summary (Last 24 hours) at 09/25/16 1637 Last data filed at 09/25/16 0830  Gross per 24 hour  Intake              360 ml  Output              450 ml  Net              -90 ml   Filed Weights   09/12/16 1947 09/13/16 2038  Weight: 98.4 kg (217 lb) 98.4 kg (217 lb)     Examination: General exam: Awake, laying in bed, in nad Respiratory system: Normal respiratory effort, no wheezing Cardiovascular system: regular rate, s1, s2 Gastrointestinal system: Soft, nondistended, positive BS Central nervous system: CN2-12 grossly intact, strength intact Extremities: Perfused, no clubbing Skin: Normal skin turgor, no notable skin lesions seen Psychiatry: Mood normal // no visual hallucinations   Data Reviewed: I have personally reviewed following labs and imaging studies  CBC:  Recent Labs Lab 09/24/16 0518  WBC 5.4  HGB 12.1  HCT 36.5  MCV 82.8  PLT 189   Basic Metabolic Panel:  Recent Labs Lab 09/25/16 0549  NA 142  K 3.6  CL 111  CO2 23  GLUCOSE 92  BUN 16  CREATININE 0.97  CALCIUM 9.0   GFR: Estimated Creatinine Clearance: 67.2 mL/min (by C-G formula based on SCr of 0.97 mg/dL). Liver Function Tests: No results for input(s): AST, ALT, ALKPHOS, BILITOT, PROT, ALBUMIN in the last 168 hours. No results for input(s): LIPASE, AMYLASE in the last 168 hours. No results for input(s): AMMONIA in the last 168 hours. Coagulation Profile:  Recent Labs Lab 09/19/16 [id number] 09/22/16 0521 09/23/16 0544 09/24/16 0518 09/25/16 0549  INR 2.42 3.68 3.55 2.63 2.18   Cardiac Enzymes: No results for input(s): CKTOTAL, CKMB, CKMBINDEX, TROPONINI in the last 168 hours. BNP (last 3 results) No results for input(s): PROBNP in the last 8760 hours. HbA1C: No results for input(s): HGBA1C in the last 72 hours. CBG:  Recent Labs Lab 09/24/16 1132 09/24/16 1658 09/24/16 2103 09/25/16 0750 09/25/16 1159  GLUCAP 123* 118* 191* 94 124*   Lipid Profile: No results for input(s): CHOL, HDL, LDLCALC, TRIG, CHOLHDL, LDLDIRECT in the last 72 hours. Thyroid Function Tests: No results for input(s): TSH, T4TOTAL, FREET4, T3FREE, THYROIDAB in the last 72 hours. Anemia Panel: No results for input(s): VITAMINB12, FOLATE, FERRITIN, TIBC, IRON, RETICCTPCT in  the last 72 hours. Sepsis Labs: No results for input(s): PROCALCITON, LATICACIDVEN in the last 168 hours.  No results found for this or any previous visit (from the past 240 hour(s)).   Radiology Studies: Dg Chest Port 1 View  Result Date: 09/25/2016 CLINICAL DATA:  Positive TB test.  Ex-smoker. EXAM: PORTABLE CHEST 1 VIEW COMPARISON:  05/31/2016. FINDINGS: Poor inspiration. Grossly stable normal sized heart. Stable prominence of the interstitial markings. Thoracic spine degenerative changes. IMPRESSION: Stable mild to moderate chronic interstitial lung disease compatible with the history of smoking. No acute abnormality and no evidence of active or previous tuberculosis infection. Electronically Signed   By: Beckie SaltsSteven  Reid M.D.   On: 09/25/2016 12:51    Scheduled Meds: . amLODipine  10 mg Oral Daily  . atorvastatin  40 mg Oral Daily  . donepezil  10 mg Oral QHS  . insulin aspart  0-9 Units Subcutaneous TID WC  . levothyroxine  75 mcg Oral QAC breakfast  . mouth rinse  15 mL Mouth Rinse BID  . mirtazapine  15 mg Oral QHS  .  sertraline  100 mg Oral BID  . warfarin  10 mg Oral ONCE-1800  . Warfarin - Pharmacist Dosing Inpatient   Does not apply q1800   Continuous Infusions:   LOS: 1 day   Moksh Loomer, Scheryl Marten, MD [facility] Pager [phone]  If 7PM-7AM, please contact night-coverage [email or web address] Password [facility] 09/25/2016, 4:37 PM

## 2016-09-25 NOTE — Progress Notes (Signed)
   09/25/16 1500  OT G-codes **NOT FOR INPATIENT CLASS**  Other OT Primary Discharge Status [phone]([id number]) CI  Marica OtterMaryellen Iyanah Demont, OTR/L [phone] 09/25/2016
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
## 2016-09-25 NOTE — Progress Notes (Signed)
Physical Therapy Discharge Patient Details Name: [name] MRN: [id number] DOB: [birth year] Today's Date: 09/25/2016 Time:  -     Patient discharged from PT services secondary to goals met and no further PT needs identified.  Please see latest therapy progress note for current level of functioning and progress toward goals.    Progress and discharge plan discussed with patient  GP     Marcelino Freestone PT [phone]  09/25/2016, 12:30 PM

## 2016-09-25 NOTE — Progress Notes (Signed)
CSW inquired about list of facilities discussed with CSW Chiropodistassistant director.  CSW contacted patient family to discuss assistance w/ finding facility for patient. Waiting for return call.   Shannon BarrackNicole Sheneka Reid, Shannon MajorsLCSWA, MSW Clinical Social Worker 5E and Psychiatric Service Line [phone] 09/25/2016  12:10 PM

## 2016-09-26 DIAGNOSIS — N179 Acute kidney failure, unspecified: Secondary | ICD-10-CM | POA: Diagnosis not present

## 2016-09-26 DIAGNOSIS — I358 Other nonrheumatic aortic valve disorders: Secondary | ICD-10-CM

## 2016-09-26 DIAGNOSIS — Z7901 Long term (current) use of anticoagulants: Secondary | ICD-10-CM | POA: Diagnosis not present

## 2016-09-26 DIAGNOSIS — I10 Essential (primary) hypertension: Secondary | ICD-10-CM | POA: Diagnosis not present

## 2016-09-26 DIAGNOSIS — R739 Hyperglycemia, unspecified: Secondary | ICD-10-CM | POA: Diagnosis not present

## 2016-09-26 LAB — GLUCOSE, CAPILLARY
GLUCOSE-CAPILLARY: 105 mg/dL — AB (ref 65–99)
GLUCOSE-CAPILLARY: 120 mg/dL — AB (ref 65–99)
GLUCOSE-CAPILLARY: 117 mg/dL — AB (ref 65–99)

## 2016-09-26 LAB — PROTIME-INR
INR: 2.23
PROTHROMBIN TIME: 24.5 s — AB (ref 11.4–15.2)

## 2016-09-26 MED ORDER — WARFARIN SODIUM 5 MG PO TABS
7.5000 mg | ORAL_TABLET | Freq: Once | ORAL | Status: AC
  Administered 2016-09-26: 18:00:00 7.5 mg via ORAL
  Filled 2016-09-26: qty 1

## 2016-09-26 MED ORDER — LORAZEPAM 2 MG/ML IJ SOLN: 0.5000 mg | INTRAMUSCULAR | Status: DC | PRN

## 2016-09-26 NOTE — Clinical Social Work Placement (Addendum)
[facility] has been accepted to [facility].  Patient will transport via [facility] to facility. Called to Transport.  Nurse call report to: [phone].   Paperwork received. Patient cousin completed admission paperwork.  Patient understands she will  transport to "[facility]    CLINICAL SOCIAL WORK PLACEMENT  NOTE  Date:  09/26/2016  Patient Details  Name: [name] MRN: [id number] Date of Birth: 1950-09-15  Clinical Social Work is seeking post-discharge placement for this patient at the Assisted Living Facility level of care (*CSW will initial, date and re-position this form in  chart as items are completed):  Yes   Patient/family provided with [facility] Clinical Social Work Department's list of facilities offering this level of care within the geographic area requested by the patient (or if unable, by the patient's family).  Yes   Patient/family informed of their freedom to choose among providers that offer the needed level of care, that participate in Medicare, Medicaid or managed care program needed by the patient, have an available bed and are willing to accept the patient.  Yes   Patient/family informed of 's ownership interest in [facility] and [facility], as well as of the fact that they are under no obligation to receive care at these facilities.  PASRR submitted to EDS on       PASRR number received on       Existing PASRR number confirmed on 09/26/16     FL2 transmitted to all facilities in geographic area requested by pt/family on       FL2 transmitted to all facilities within larger geographic area on       Patient informed that his/her managed care company has contracts with or will negotiate with certain facilities, including the following:  [facility]         Patient/family informed of bed offers received.  Patient chooses bed at       Physician recommends and patient  chooses bed at      [facility] on 09/26/16.  Patient to be transferred to facility by [facility]      Patient family notified on 09/26/16 of transfer.  Name of family member notified:  [name], electronically completing admission paperwork     PHYSICIAN Please prepare priority discharge summary, including medications     Additional Comment:    _______________________________________________ Clearance CootsNicole A Emile Ringgenberg, LCSW 09/26/2016, 2:49 PM

## 2016-09-26 NOTE — Progress Notes (Signed)
09/26/16  1920  [facility] given to [facility].

## 2016-09-26 NOTE — Progress Notes (Signed)
09/26/16  1905 Called to give report. No answer. [facility] [phone] [phone]  EMS here to get patient.

## 2016-09-26 NOTE — Discharge Summary (Signed)
Physician Discharge Summary  [name]:[id number] DOB: 05-May-1950 DOA: 09/12/2016  PCP: Kirby Funk, MD  Admit date: 09/12/2016 Discharge date: 09/26/2016  Admitted From: Home Disposition:  SNF  Recommendations for Outpatient Follow-up:  1. 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 On evaluation in emergency department she was noted to to have AKI with creatinine of 2.69, INR of 2.24. Given kidney function patient was admitted for hydration. Also social worker was consulted due to unsafe discharge and social issues the patient cannot live alone and take care of her self. Patient was treated with IV fluids and now herlabs havenormalize. Patient now medically stable for discharge although unsafe discharge as patient cannot care for herself due to her sever dementia  Acute renal failure: Patient's baseline creatinine previously 1-1.1.Patient presented with a creatinine of 2.69 and BUN of 30. Suspect aspect of dehydration given patient's history poor oral intake and urinary frequency. -Renal function noted to have improved with IVF hydration  Type 2 diabetes with hyperglycemia:Acute.Patient presents with elevated glucose 225 on admission  -Pt continued with SSI coverage while hospitalized  Metabolic acidosis with elevated anion gap: Improved.   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 Patient last suffered anoccipital stroke in 05/2016. Patient withmultiple risk factors for CVA for which she is on Coumadin and appears to have therapeutic INR. Patient tolerating anticoagulation. Will  continue  Progressive vascular dementia: Patient presents with confusion. CT scan of the brain was negative for any signs of acute stroke. - SW consulted for placement. Plan for SNF  Hypothyroidism: Patient's last TSH was 0.62to approximately 1 month ago.  - Will continue with thyroid replacement as tolerated  Depression and anxiety - Plan to continue Zoloft as tolerated  Essential hypertension - Held lisinopril secondary to presenting renal insufficiency - continued amlodipine as pt tolerates - Stable at present  COPD: Remains stable. No active wheezing  Hyperlipidemia - Patient is continued on atorvastatin - Stable at this time  Borderline positive quantiferon gold - Have ordered and reviewed CXR with no signs suggesting active TB infection - Discussed case with ID, Dr. 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   atorvastatin 40 MG tablet Commonly known as:  LIPITOR Take 40 mg by mouth daily.   donepezil 10 MG tablet Commonly known as:  ARICEPT Take 1 tablet (10 mg total) by mouth at bedtime.   levothyroxine  25 MCG tablet Commonly known as:  SYNTHROID, LEVOTHROID Take 25 mcg by mouth daily  before breakfast. 75 mcg every morning   levothyroxine 50 MCG tablet Commonly known as:  SYNTHROID, LEVOTHROID Take 50 mcg by mouth daily before breakfast. 75 mcg every morning   mirtazapine 15 MG tablet Commonly known as:  REMERON Take 15 mg by mouth at bedtime.   sertraline 100 MG tablet Commonly known as:  ZOLOFT Take 1 tablet (100 mg total) by mouth 2 (two) times daily.   warfarin 1 MG tablet Commonly known as:  COUMADIN Take 1 mg by mouth every evening. 11 mg each evening What changed:  Another medication with the same name was removed. Continue taking this medication, and follow the directions you see here.      Contact information for after-discharge care    Destination    [facility] Follow up.   Specialty:  Skilled Nursing Facility Contact information: [address] [place] [id number] [phone]             No Known Allergies  Consultations:  Discussed case with ID  Procedures/Studies: Ct Head Wo Contrast  Result Date: 09/12/2016 CLINICAL DATA:  66 y/o  F; confusion. EXAM: CT HEAD WITHOUT CONTRAST TECHNIQUE: Contiguous axial images were obtained from the base of the skull through the vertex without intravenous contrast. COMPARISON:  05/31/2016 MRI head.  05/31/2016 CT head. FINDINGS: Brain: Stable chronic left occipital lobe infarction extending into left splenium of corpus callosum, small chronic lacunar infarcts within the bilateral cerebellar hemispheres, and mild parenchymal volume loss of the brain for age. No evidence for large acute vascular territory infarction, intracranial hemorrhage, or focal mass effect. No effacement of basilar cisterns. Vascular: Calcific atherosclerosis of carotid siphons. No hyperdense vessel. Skull: Normal. Negative for fracture or focal lesion. Sinuses/Orbits: No acute finding. Other: None. IMPRESSION: 1. No acute intracranial abnormality identified. 2. Stable chronic left  occipital/splenium infarct, chronic lacunar infarcts in cerebellum, and mild brain parenchymal volume loss. Electronically Signed   By: Mitzi HansenLance  Furusawa-Stratton M.D.   On: 09/12/2016 21:53   [place] Renal  Result Date: 09/13/2016 CLINICAL DATA:  Acute renal failure. EXAM: RENAL / URINARY TRACT ULTRASOUND COMPLETE COMPARISON:  None. FINDINGS: Right Kidney: Length: 9.5 cm. Echogenicity within normal limits. No mass or hydronephrosis visualized. Left Kidney: Length: 10.6 cm. Echogenicity within normal limits. No mass or hydronephrosis visualized. Bladder: Appears normal for degree of bladder distention. IMPRESSION: No cause for acute renal failure identified. Electronically Signed   By: Gerome Samavid  Williams III M.D   On: 09/13/2016 08:56   Dg Chest Port 1 View  Result Date: 09/25/2016 CLINICAL DATA:  Positive TB test.  Ex-smoker. EXAM: PORTABLE CHEST 1 VIEW COMPARISON:  05/31/2016. FINDINGS: Poor inspiration. Grossly stable normal sized heart. Stable prominence of the interstitial markings. Thoracic spine degenerative changes. IMPRESSION: Stable mild to moderate chronic interstitial lung disease compatible with the history of smoking. No acute abnormality and no evidence of active or previous tuberculosis infection. Electronically Signed   By: Beckie SaltsSteven  Reid M.D.   On: 09/25/2016 12:51    Subjective: No complaints  Discharge Exam: Vitals:   09/26/16 0932 09/26/16 1400  BP: 127/80 119/78  Pulse: (!) 56 63  Resp:    Temp:  98.5 F (36.9 C)  SpO2: 94% 98%   Vitals:   09/26/16 0510 09/26/16 0800 09/26/16 0932 09/26/16 1400  BP: 136/77 126/79 127/80 119/78  Pulse: (!) 56 (!) 54 (!) 56 63  Resp:  18 18    Temp: 98.1 F (36.7 C)   98.5 F (36.9 C)  TempSrc: Oral   Oral  SpO2: 94% 97% 94% 98%  Weight:      Height:        General: Pt is alert, awake, not in acute distress Cardiovascular: RRR, S1/S2 +, no rubs, no gallops Respiratory: CTA bilaterally, no wheezing, no rhonchi Abdominal: Soft, NT, ND,  bowel sounds + Extremities: no edema, no cyanosis   The results of significant diagnostics from this hospitalization (including imaging, microbiology, ancillary and laboratory) are listed below for reference.     Microbiology: No results found for this or any previous visit (from the past 240 hour(s)).   Labs: BNP (last 3 results) No results for input(s): BNP in the last 8760 hours. Basic Metabolic Panel:  Recent Labs Lab 09/25/16 0549  NA 142  K 3.6  CL 111  CO2 23  GLUCOSE 92  BUN 16  CREATININE 0.97  CALCIUM 9.0   Liver Function Tests: No results for input(s): AST, ALT, ALKPHOS, BILITOT, PROT, ALBUMIN in the last 168 hours. No results for input(s): LIPASE, AMYLASE in the last 168 hours. No results for input(s): AMMONIA in the last 168 hours. CBC:  Recent Labs Lab 09/24/16 0518  WBC 5.4  HGB 12.1  HCT 36.5  MCV 82.8  PLT 189   Cardiac Enzymes: No results for input(s): CKTOTAL, CKMB, CKMBINDEX, TROPONINI in the last 168 hours. BNP: Invalid input(s): POCBNP CBG:  Recent Labs Lab 09/25/16 1159 09/25/16 1639 09/25/16 2119 09/26/16 0736 09/26/16 1152  GLUCAP 124* 123* 172* 105* 120*   D-Dimer No results for input(s): DDIMER in the last 72 hours. Hgb A1c No results for input(s): HGBA1C in the last 72 hours. Lipid Profile No results for input(s): CHOL, HDL, LDLCALC, TRIG, CHOLHDL, LDLDIRECT in the last 72 hours. Thyroid function studies No results for input(s): TSH, T4TOTAL, T3FREE, THYROIDAB in the last 72 hours.  Invalid input(s): FREET3 Anemia work up No results for input(s): VITAMINB12, FOLATE, FERRITIN, TIBC, IRON, RETICCTPCT in the last 72 hours. Urinalysis    Component Value Date/Time   COLORURINE YELLOW 09/12/2016 2010   APPEARANCEUR CLEAR 09/12/2016 2010   LABSPEC 1.014 09/12/2016 2010   PHURINE 5.0 09/12/2016 2010   GLUCOSEU NEGATIVE 09/12/2016 2010   HGBUR NEGATIVE 09/12/2016 2010   BILIRUBINUR NEGATIVE 09/12/2016 2010   KETONESUR  NEGATIVE 09/12/2016 2010   PROTEINUR NEGATIVE 09/12/2016 2010   UROBILINOGEN 0.2 09/16/2011 2240   NITRITE NEGATIVE 09/12/2016 2010   LEUKOCYTESUR NEGATIVE 09/12/2016 2010   Sepsis Labs Invalid input(s): PROCALCITONIN,  WBC,  LACTICIDVEN Microbiology No results found for this or any previous visit (from the past 240 hour(s)).   SIGNED:   Jerald Kief, MD  [facility] 09/26/2016, 3:09 PM  If 7PM-7AM, please contact night-coverage [email or web address] Password [email or web address]

## 2016-09-26 NOTE — Progress Notes (Deleted)
[facility] Accepting Physician: Dr. Lowanda FosterBeverly Jones Nurse please call report to: [phone] [facility]. Patient spouse went to get get clothing.   Vivi BarrackNicole Jet Traynham, Theresia MajorsLCSWA, MSW Clinical Social Worker 5E and Psychiatric Service Line [phone] 09/26/2016  2:25 PM

## 2016-09-26 NOTE — Progress Notes (Signed)
Date: September 26, 2016 Chart reviewed for discharge orders: None found for case management. Rhonda Davis,BSN,RN3, CCM/[phone] 

## 2016-09-26 NOTE — Progress Notes (Signed)
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  Assessment: [age] yoF, CVA May/18, living with friend after rehab. To ED reportedly "needing her INR checked". Will need placement at discharge   Baseline INR WNL  Prior anticoagulation: Warfarin 10 mg daily  Significant events:  Today, 09/26/2016:  CBC: stable WNL as of 8/29  INR now therapeutic and trending appropriately  Major drug interactions: none noted  No bleeding issues per nursing  Eating 50-100% of meals  Goal of Therapy: INR 2-3  Plan:  Warfarin 7.5 mg PO tonight (slight reduction in weekly dose given recent INR trends on 10 mg daily, would continue with 10 mg daily going forward as long as INR remains stable)  CBC at least weekly  Given recent elevated INRs, consider slight reduction in dose at discharge (i.e. take 1/2 tab or skip dose one day per week). Would recheck INR in 3-5 days to ensure not trending up again.  Monitor for signs of bleeding or thrombosis   Bernadene Personrew Jorgina Binning, PharmD Pager: [phone] 09/26/2016, 10:56 AM

## 2016-09-27 LAB — PROTIME-INR: PROTIME: 27.7 — AB (ref 10.0–13.8)

## 2016-09-27 LAB — POCT INR: INR: 2.7 — AB (ref 0.9–1.1)

## 2016-09-29 LAB — POCT INR: INR: 2.4 — AB (ref 0.9–1.1)

## 2016-09-29 LAB — PROTIME-INR: PROTIME: 25.1 — AB (ref 10.0–13.8)

## 2016-09-29 NOTE — Progress Notes (Signed)
(  Late entry) 09/26/2016 1607  CSW spoke with [name], financial power of attorney to notify him that [facility] (ALF wing) had accepted [name] for today. Mr. Yetta BarreJones agreed to have [name] transported to [facility] today (Friday) and stated he would complete her admissions paperwork with the facility via email. He thanked Retail bankerthe writer numerous times expressing appreciation for everything the staff did to support [name].  Gretta CoolZackary Mathieu Schloemer, Marine scientistLCSW Assistant Director of Clinical Social Work [facility] [phone]

## 2016-09-30 ENCOUNTER — Encounter: Payer: Self-pay | Admitting: Internal Medicine

## 2016-09-30 ENCOUNTER — Non-Acute Institutional Stay (SKILLED_NURSING_FACILITY): Payer: Medicare Other | Admitting: Internal Medicine

## 2016-09-30 DIAGNOSIS — E1149 Type 2 diabetes mellitus with other diabetic neurological complication: Secondary | ICD-10-CM | POA: Diagnosis not present

## 2016-09-30 DIAGNOSIS — R7612 Nonspecific reaction to cell mediated immunity measurement of gamma interferon antigen response without active tuberculosis: Secondary | ICD-10-CM | POA: Diagnosis not present

## 2016-09-30 DIAGNOSIS — I358 Other nonrheumatic aortic valve disorders: Secondary | ICD-10-CM

## 2016-09-30 DIAGNOSIS — N179 Acute kidney failure, unspecified: Secondary | ICD-10-CM | POA: Diagnosis not present

## 2016-09-30 NOTE — Assessment & Plan Note (Addendum)
09/30/16 fasting blood glucose range is 86-126 A1c 6.8 % on 09/13/16 Continue monitor off DM meds

## 2016-09-30 NOTE — Progress Notes (Signed)
NURSING HOME LOCATION:  [facility] ROOM NUMBER:   [id number]  CODE STATUS:  Full Code  PCP:  Kirby Funk, MD  [address] [place] [address]    This is a comprehensive admission note to [facility] performed on this date less than 30 days from date of admission. Included are preadmission medical/surgical history;reconciled medication list; family history; social history and comprehensive review of systems.  Corrections and additions to the records were documented . Comprehensive physical exam was also performed. Additionally a clinical summary was entered for each active diagnosis pertinent to this admission in the Problem List to enhance continuity of care.  HPI: The patient was hospitalized 8/17-8/31/18 with acute kidney injury with a creatinine of 2.69 in the context of history of poor oral intake and urinary frequency. The patient was admitted for hydration. The baseline creatinine for this patient was 1-1.1. With IV hydration renal function did improve. The patient does have type 2 diabetes; glucose was 225 at admission. She received sliding scale insulin while hospitalized. The patient has progressive vascular dementia, CT revealed no acute process. Patient has hypothyroidism, most recent TSH was 0.62 prior to admission. Quantiferon Gold test for TB screening was borderline positive. ID states this is most likely a false positive. Chest x-ray revealed no sign of active TB.(It was personally reviewed) Social work felt that the home situation was unsafe and that the patient cannot live alone prompting discharge to the SNF.  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 She is on chronic anticoagulation. Other diagnoses include COPD, essential hypertension, depression/anxiety, and dyslipidemia.  Social history: She states she quit smoking 3 months  ago but could not give me the month or the year.  Family history: She states her mother may have "a touch of diabetes" & there may be diabetes in the paternal family. She requested I not enter this into the chart.  Review of systems:Could not be completed due to dementia. She was unable to give the date. She was unable to name her diabetic medications .She denies using insulin. She was not monitoring glucoses. As I continued questions she became very upset and tearful stating "I have dementia, I am unable" Her only validated complaints include being "wobbly and dizzy". She denies frank syncope. She denies any cardiac or neurologic prodrome to these symptoms. She also does describe anxiety.  Constitutional: No fever,significant weight change, fatigue  Eyes: No redness, discharge, pain, vision change ENT/mouth: No nasal congestion,  purulent discharge, earache,change in hearing ,sore throat  Cardiovascular: No chest pain, palpitations,paroxysmal nocturnal dyspnea, claudication, edema  Respiratory: No cough, sputum production,hemoptysis, DOE , significant snoring,apnea  Gastrointestinal: No heartburn,dysphagia,abdominal pain, nausea / vomiting,rectal bleeding, melena,change in bowels Genitourinary: No dysuria,hematuria, pyuria,  incontinence, nocturia Musculoskeletal: No joint stiffness, joint swelling, weakness,pain Dermatologic: No rash, pruritus, change in appearance of skin Neurologic: No headache,syncope, seizures, numbness , tingling Psychiatric: No significant  depression, insomnia, anorexia Endocrine: No change in hair/skin/ nails, excessive thirst, excessive hunger, excessive urination  Hematologic/lymphatic: No significant bruising, lymphadenopathy,abnormal bleeding Allergy/immunology: No itchy/ watery eyes, significant sneezing, urticaria, angioedema  Physical exam:  Pertinent or positive findings: She has an upper dental plate. Field of vision appears to be decreased medially in the  left eye. Otherwise cranial nerve exam appeared to be intact. Clubbing of the nailbeds suggested. General appearance:Adequately nourished; no acute distress , increased work of  breathing is present.   Lymphatic: No lymphadenopathy about the head, neck, axilla . Eyes: No conjunctival inflammation or lid edema is present. There is no scleral icterus. Ears:  External ear exam shows no significant lesions or deformities.   Nose:  External nasal examination shows no deformity or inflammation. Nasal mucosa are pink and moist without lesions ,exudates Oral exam: lips and gums are healthy appearing.There is no oropharyngeal erythema or exudate . Neck:  No thyromegaly, masses, tenderness noted.    Heart:  Normal rate and regular rhythm. S1 and S2 normal without gallop, murmur, click, rub .  Lungs:Chest clear to auscultation without wheezes, rhonchi,rales , rubs. Abdomen:Bowel sounds are normal. Abdomen is soft and nontender with no organomegaly, hernias,masses. GU: deferred  Extremities:  No cyanosis,edema  Neurologic exam : Strength equal  in upper & lower extremities Balance,Rhomberg,finger to nose testing could not be completed due to clinical state Deep tendon reflexes are equal Skin: Warm & dry w/o tenting. No significant lesions or rash.  See clinical summary under each active problem in the Problem List with associated updated therapeutic plan

## 2016-09-30 NOTE — Assessment & Plan Note (Signed)
Continue warfarin lifelong

## 2016-09-30 NOTE — Patient Instructions (Signed)
See assessment and plan under each diagnosis in the problem list and acutely for this visit 

## 2016-10-01 ENCOUNTER — Encounter: Payer: Self-pay | Admitting: Internal Medicine

## 2016-10-01 NOTE — Assessment & Plan Note (Signed)
Med List reveals no nephrotoxic agents Monitor BMET

## 2016-10-01 NOTE — Assessment & Plan Note (Signed)
09/25/16 Port Chest x-ray negative for TB (personally reviewed)

## 2016-10-06 ENCOUNTER — Other Ambulatory Visit: Payer: Self-pay | Admitting: Neurology

## 2016-10-13 LAB — PROTIME-INR: Protime: 17.1 — AB (ref 10.0–13.8)

## 2016-10-13 LAB — POCT INR: INR: 1.4 — AB (ref 0.9–1.1)

## 2016-10-16 LAB — POCT INR: INR: 2.3 — AB (ref 0.9–1.1)

## 2016-10-16 LAB — PROTIME-INR: Protime: 25 — AB (ref 10.0–13.8)

## 2016-10-20 LAB — PROTIME-INR: Protime: 23.3 — AB (ref 10.0–13.8)

## 2016-10-20 LAB — POCT INR: INR: 2.1 — AB (ref 0.9–1.1)

## 2016-10-27 ENCOUNTER — Encounter: Payer: Self-pay | Admitting: Adult Health

## 2016-10-27 ENCOUNTER — Non-Acute Institutional Stay (SKILLED_NURSING_FACILITY): Payer: Medicare Other | Admitting: Adult Health

## 2016-10-27 DIAGNOSIS — I358 Other nonrheumatic aortic valve disorders: Secondary | ICD-10-CM | POA: Diagnosis not present

## 2016-10-27 DIAGNOSIS — Z8673 Personal history of transient ischemic attack (TIA), and cerebral infarction without residual deficits: Secondary | ICD-10-CM

## 2016-10-27 DIAGNOSIS — E1149 Type 2 diabetes mellitus with other diabetic neurological complication: Secondary | ICD-10-CM | POA: Diagnosis not present

## 2016-10-27 DIAGNOSIS — G300 Alzheimer's disease with early onset: Secondary | ICD-10-CM | POA: Diagnosis not present

## 2016-10-27 DIAGNOSIS — F0280 Dementia in other diseases classified elsewhere without behavioral disturbance: Secondary | ICD-10-CM

## 2016-10-27 DIAGNOSIS — F339 Major depressive disorder, recurrent, unspecified: Secondary | ICD-10-CM

## 2016-10-27 DIAGNOSIS — E034 Atrophy of thyroid (acquired): Secondary | ICD-10-CM | POA: Diagnosis not present

## 2016-10-27 DIAGNOSIS — I10 Essential (primary) hypertension: Secondary | ICD-10-CM

## 2016-10-27 LAB — PROTIME-INR: Protime: 28 — AB (ref 10.0–13.8)

## 2016-10-27 LAB — POCT INR: INR: 2.7 — AB (ref 0.9–1.1)

## 2016-10-27 NOTE — Progress Notes (Signed)
DATE:  10/27/2016   MRN:  [id number]  BIRTHDAY: Jun 23, 1950  Facility:  Nursing Home Location:  [facility] Room Number: 109-B  LEVEL OF CARE:  SNF (31)  Contact Information    Name Relation Home Work Mobile   Jones,[name] Relative [phone]     Jones,[name] Relative   [phone]   Jones,Jerel Relative   [phone]       Code Status History    Date Active Date Inactive Code Status Order ID Comments User Context   09/12/2016 11:46 PM 09/26/2016 10:46 PM Full Code [id number]  Clydie Braun, MD ED   05/31/2016  4:01 AM 06/04/2016  5:43 PM Full Code [id number]  Pearson Grippe, MD Inpatient   06/28/2015 12:08 AM 07/03/2015  8:52 PM Full Code [id number]  Lorretta Harp, MD ED   12/11/2014 10:19 AM 12/11/2014  3:36 PM Full Code [id number]  Mack Hook, MD Inpatient       Chief Complaint  Patient presents with  . Medical Management of Chronic Issues    Routine visit    HISTORY OF PRESENT ILLNESS:  This is a 65-YO female seen for a routine visit.  She is a long-term care resident of [facility].  She has a PMH of vascular dementia, hypothyroidism, COPD, essential hypertension, depression/anxiety, dyslipidemia, and Lambl's excrescences of the aortic valve complicated by occipital stroke. She was seen in her room today. She is currently having PT and OT.    PAST MEDICAL HISTORY:  Past Medical History:  Diagnosis Date  . COPD (chronic obstructive pulmonary disease) (HCC)    on CT scan 06/28/2015  . Dementia   . Diabetes mellitus   . Hypertension   . Hypothyroidism   . Idiopathic acute pancreatitis 09/16/2011  . 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 Memory loss   . Pancreatitis 2010  . Tobacco abuse   . Trigger finger      CURRENT MEDICATIONS: Reviewed  Patient's Medications  New Prescriptions   No medications on file  Previous Medications   AMLODIPINE (NORVASC) 10 MG TABLET    Take 10 mg by mouth daily.   ATORVASTATIN  (LIPITOR) 40 MG TABLET    Take 40 mg by mouth daily.   DONEPEZIL (ARICEPT) 10 MG TABLET    Take 1 tablet (10 mg total) by mouth at bedtime.   LEVOTHYROXINE (SYNTHROID, LEVOTHROID) 75 MCG TABLET    Take 75 mcg by mouth daily before breakfast.   MIRTAZAPINE (REMERON) 15 MG TABLET    Take 15 mg by mouth at bedtime.    SERTRALINE (ZOLOFT) 100 MG TABLET    Take 1 tablet (100 mg total) by mouth 2 (two) times daily.   WARFARIN (COUMADIN) 4 MG TABLET    Take 4 mg by mouth daily.  Modified Medications   No medications on file  Discontinued Medications   LEVOTHYROXINE (SYNTHROID, LEVOTHROID) 25 MCG TABLET    Take 25 mcg by mouth daily before breakfast. Take along with a 50 mcg tablet to = 75 mcq   LEVOTHYROXINE (SYNTHROID, LEVOTHROID) 50 MCG TABLET    Take 75 mcg by mouth daily before breakfast.    WARFARIN (COUMADIN) 1 MG TABLET    Take 1 mg by mouth every evening. 11 mg each evening     No Known Allergies   REVIEW OF SYSTEMS:  GENERAL: no change in appetite, no fatigue, no weight changes, no fever, chills or weakness NOSE: Denies nasal congestion or epistaxis MOUTH and  THROAT: Denies oral discomfort RESPIRATORY: no cough, SOB, DOE, wheezing, hemoptysis CARDIAC: no chest pain, edema or palpitations GI: no abdominal pain, diarrhea, constipation, heart burn, nausea or vomiting GU: Denies dysuria, frequency, hematuria, incontinence, or discharge PSYCHIATRIC: Denies feeling of depression or anxiety. No report of hallucinations, insomnia, paranoia, or agitation   PHYSICAL EXAMINATION  GENERAL APPEARANCE: Well nourished. In no acute distress. Obese SKIN:  Skin is warm and dry.  MOUTH and THROAT: Lips are without lesions. Oral mucosa is moist and without lesions. RESPIRATORY: breathing is even & unlabored, BS CTAB CARDIAC: RRR, no murmur,no extra heart sounds, no edema GI: abdomen soft, normal BS, no masses, no tenderness EXTREMITIES:  Able to move X 4 extremities PSYCHIATRIC: Alert to self,  disoriented to time and place. Affect and behavior are appropriate    LABS/RADIOLOGY: Labs reviewed: Basic Metabolic Panel:  Recent Labs  40/98/11 0807 09/18/16 0851 09/25/16 0549  NA 140 141 142  K 4.1 3.8 3.6  CL 112* 109 111  CO2 GLUCOSE 97 135* 92  BUN CREATININE 0.97 1.06* 0.97  CALCIUM 8.9 9.3 9.0   Liver Function Tests:  Recent Labs  05/31/16 0427 06/04/16 1043 06/06/16 09/12/16 2010  AST 57*  ALT 32  ALKPHOS 74 78 82 86  BILITOT 0.8 0.4  --  0.5  PROT 7.1 6.6  --  8.3*  ALBUMIN 3.3* 3.2*  --  4.3   CBC:  Recent Labs  05/30/16 1812  06/06/16 09/12/16 2010  09/14/16 0559 09/15/16 0807 09/24/16 0518  WBC 6.1  < > 6.0 8.0  < > 6.1 5.0 5.4  NEUTROABS 2.8  --  3 6.0  --   --   --   --   HGB 13.6  < > 13.7 13.5  < > 12.5 12.6 12.1  HCT 43.6  < > 44 41.2  < > 39.1 38.2 36.5  MCV 89.2  < >  --  84.8  < > 83.7 82.2 82.8  PLT 132*  < > 160 176  < > 149* 149* 189  < > = values in this interval not displayed. Lipid Panel:  Recent Labs  05/31/16 0427  HDL 44   CBG:  Recent Labs  09/26/16 0736 09/26/16 1152 09/26/16 1712  GLUCAP 105* 120* 117*     ASSESSMENT/PLAN:  1. History of CVA (cerebrovascular accident) - Stable, continue Coumadin 4 mg daily, atorvastatin 40 mg 1 tab daily and amlodipine 10 mg 1 tab daily   2. 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 Benign essential HTN - well controlled, amlodipine 10 mg 1 tab daily   4. Type II diabetes mellitus with neurological manifestations (HCC) - currently not on any diabetic medication, CBGs has been well controlled, will decrease CBG monitoring from daily to once a week Lab Results  Component Value Date   HGBA1C 6.8 (H) 09/13/2016    5. Early onset Alzheimer's dementia without behavioral disturbance - she stated working for [employer] X 30  years but was not able to tell month, year and place, continue donepezil 10 mg 1 tab daily at bedtime, supportive care and fall precautions   6. Hypothyroidism due to acquired atrophy of thyroid - continue levothyroxine 75 g 1 tab daily Lab Results  Component Value Date   TSH 0.62 08/08/2016  7. Depression, recurrent (HCC) - continue sertraline 100 mg 1 tab twice a day and mirtazapine I 15 mg 1 tab daily at bedtime      Goals of care:  Long-term care     Cherryl Babin C. Medina-Vargas - NP    [employer] [phone]

## 2016-11-04 ENCOUNTER — Other Ambulatory Visit: Payer: Self-pay | Admitting: Neurology

## 2016-11-17 LAB — POCT INR: INR: 2.4 — AB (ref 0.9–1.1)

## 2016-11-17 LAB — PROTIME-INR: PROTIME: 25.2 — AB (ref 10.0–13.8)

## 2016-11-21 LAB — POCT INR: INR: 1.9 — AB (ref 0.9–1.1)

## 2016-11-21 LAB — PROTIME-INR: PROTIME: 21.5 — AB (ref 10.0–13.8)

## 2016-11-24 LAB — PROTIME-INR: Protime: 31.7 — AB (ref 10.0–13.8)

## 2016-11-24 LAB — POCT INR: INR: 3.2 — AB (ref 0.9–1.1)

## 2016-11-25 ENCOUNTER — Non-Acute Institutional Stay (SKILLED_NURSING_FACILITY): Payer: Medicare Other

## 2016-11-25 DIAGNOSIS — Z0000 Encounter for general adult medical examination without abnormal findings: Secondary | ICD-10-CM

## 2016-11-25 NOTE — Progress Notes (Signed)
Subjective:   [name] is a 66 y.o. female who presents for Medicare Annual (Subsequent) preventive examination at [facility]  Last AWV-11/07/2015    Objective:     Vitals: BP 138/80 (BP Location: Right Arm, Patient Position: Supine)   Pulse 64   Temp 98 F (36.7 C) (Oral)   Ht 5\' 5"  (1.651 m)   Wt 209 lb (94.8 kg)   SpO2 92%   BMI 34.78 kg/m   Body mass index is 34.78 kg/m.   Tobacco History  Smoking Status  . Former Smoker  . Packs/day: 0.50  . Types: Cigarettes  . Start date: 07/01/2015  Smokeless Tobacco  . Never Used    Comment: still in stress. looking for job.      Counseling given: Not Answered   Past Medical History:  Diagnosis Date  . COPD (chronic obstructive pulmonary disease) (HCC)    on CT scan 06/28/2015  . Dementia   . Diabetes mellitus   . Hypertension   . Hypothyroidism   . Idiopathic acute pancreatitis 09/16/2011  . 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 Memory loss   . Pancreatitis 2010  . Tobacco abuse   . Trigger finger    Past Surgical History:  Procedure Laterality Date  . BACK SURGERY    . CHOLECYSTECTOMY  2003  . CYST REMOVAL HAND Left 12/11/2014   Procedure: LEFT RING FINGER TENDON SHEATH CYST EXCISION;  Surgeon: Mack Hook, MD;  Location: [facility];  Service: Orthopedics;  Laterality: Left;  . SPINE SURGERY  2006   Dr. Trey Sailors - L3-L4, L4-L5 laminotomy and foraminotomy  . TEE WITHOUT CARDIOVERSION N/A 07/03/2015   Procedure: TRANSESOPHAGEAL ECHOCARDIOGRAM (TEE);  Surgeon: Quintella Reichert, MD;  Location: [facility];  Service: Cardiovascular;  Laterality: N/A;   Family History  Problem Relation Age of Onset  . Alcohol abuse Father   . Cancer Mother    History  Sexual Activity  . Sexual activity: Not on file    Outpatient Encounter Prescriptions as of 11/25/2016  Medication Sig  . amLODipine (NORVASC) 10 MG tablet Take 10 mg by mouth daily.  Marland Kitchen atorvastatin (LIPITOR) 40 MG tablet  Take 40 mg by mouth daily.  Marland Kitchen donepezil (ARICEPT) 10 MG tablet Take 1 tablet (10 mg total) by mouth at bedtime.  Marland Kitchen levothyroxine (SYNTHROID, LEVOTHROID) 75 MCG tablet Take 75 mcg by mouth daily before breakfast.  . mirtazapine (REMERON) 15 MG tablet Take 15 mg by mouth at bedtime.   . sertraline (ZOLOFT) 100 MG tablet Take 1 tablet (100 mg total) by mouth 2 (two) times daily.  . [DISCONTINUED] warfarin (COUMADIN) 4 MG tablet Take 4 mg by mouth daily.   No facility-administered encounter medications on file as of 11/25/2016.     Activities of Daily Living In your present state of health, do you have any difficulty performing the following activities: 11/25/2016 09/13/2016  Hearing? N N  Vision? N Y  Difficulty concentrating or making decisions? [name]  Walking or climbing stairs? N Y  Dressing or bathing? N N  Doing errands, shopping? [name]  Preparing Food and eating ? Y -  Using the Toilet? N -  In the past six months, have you accidently leaked urine? N -  Do you have problems with loss of bowel control? N -  Managing your Medications? Y -  Managing your Finances? Y -  Housekeeping or managing your Housekeeping? Y -  Some recent data  might be hidden    Patient Care Team: Kirby Funk, MD as PCP - General (Internal Medicine) Jamas Lav, MD as Consulting Physician (Psychiatry) Trey Sailors, MD as Consulting Physician (Neurosurgery)    Assessment:      Exercise Activities and Dietary recommendations Current Exercise Habits: The patient does not participate in regular exercise at present, Exercise limited by: None identified  Goals    None     Fall Risk Fall Risk  11/25/2016 05/04/2015 09/23/2013  Falls in the past year? No No No   Depression Screen PHQ 2/9 Scores 11/25/2016 09/23/2013 04/22/2013  PHQ - 2 Score 0 0 1     Cognitive Function MMSE - Mini Mental State Exam 09/20/2016 06/01/2016  Orientation to time 0 0  Orientation to Place 5 5  Registration 3 3  Attention/  Calculation 0 2  Recall 0 1  Language- name 2 objects 2 2  Language- repeat 1 1  Language- follow 3 step command 2 2  Language- read & follow direction 1 1  Write a sentence 1 1  Copy design 0 0  Total score 15 18   Montreal Cognitive Assessment  05/04/2015 07/03/2014  Visuospatial/ Executive (0/5) 3 3  Naming (0/3) 3 3  Attention: Read list of digits (0/2) 2 2  Attention: Read list of letters (0/1) 1 1  Attention: Serial 7 subtraction starting at 100 (0/3) 1 3  Language: Repeat phrase (0/2) 2 2  Language : Fluency (0/1) 0 1  Abstraction (0/2) 2 0  Delayed Recall (0/5) 0 0  Orientation (0/6) 6 5  Total 20 20  Adjusted Score (based on education) 20 20   6CIT Screen 11/25/2016  What Year? 0 points  What month? 0 points  What time? 3 points  Count back from 20 0 points  Months in reverse 0 points  Repeat phrase 10 points  Total Score 13    Immunization History  Administered Date(s) Administered  . Influenza Split 11/03/2011  . Influenza,inj,Quad PF,6+ Mos 11/17/2012  . Tdap 12/19/2011   Screening Tests Health Maintenance  Topic Date Due  . INFLUENZA VACCINE  08/27/2016  . MAMMOGRAM  06/06/2017 (Originally 02/25/2016)  . DEXA SCAN  06/06/2017 (Originally 01/09/2016)  . PNA vac Low Risk Adult (1 of 2 - PCV13) 06/06/2017 (Originally 01/09/2016)  . FOOT EXAM  07/23/2017 (Originally 02/15/2013)  . OPHTHALMOLOGY EXAM  07/23/2017 (Originally 04/28/2013)  . Hepatitis C Screening  07/23/2017 (Originally Oct 13, 1950)  . PAP SMEAR  07/06/2018 (Originally 01/09/1972)  . COLONOSCOPY  07/05/2025 (Originally 01/08/2001)  . HEMOGLOBIN A1C  03/16/2017  . TETANUS/TDAP  12/18/2021  . HIV Screening  Completed      Plan:  I have personally reviewed and addressed the Medicare Annual Wellness questionnaire and have noted the following in the patient's chart:  A. Medical and social history B. Use of alcohol, tobacco or illicit drugs  C. Current medications and supplements D. Functional  ability and status E.  Nutritional status F.  Physical activity G. Advance directives H. List of other physicians I.  Hospitalizations, surgeries, and ER visits in previous 12 months J.  Vitals K. Screenings to include hearing, vision, cognitive, depression L. Referrals and appointments - none  In addition, I have reviewed and discussed with patient certain preventive protocols, quality metrics, and best practice recommendations. A written personalized care plan for preventive services as well as general preventive health recommendations were provided to patient.  See attached scanned questionnaire for additional information.   Signed,  Annetta MawSara Gonthier, RN Nurse Health Advisor   Quick Notes   Health Maintenance: Flu vaccine due, will be given by facility. DEXA, PNA 13, Hep C screen due     Abnormal Screen: 6 CIT-13     Patient Concerns: None     Nurse Concerns: None  I have personally reviewed the health advisor's clinical note, was available for consultation, and agree with the assessment and plan as written. Pecola LawlessWilliam F Hopper M.D., FACP, [facility]

## 2016-11-25 NOTE — Patient Instructions (Signed)
[name] , Thank you for taking time to come for your Medicare Wellness Visit. I appreciate your ongoing commitment to your health goals. Please review the following plan we discussed and let me know if I can assist you in the future.   Screening recommendations/referrals: Colonoscopy excluded, pt is long term Mammogram excluded, pt is long term Bone Density due, ordered Recommended yearly ophthalmology/optometry visit for glaucoma screening and checkup Recommended yearly dental visit for hygiene and checkup  Vaccinations: Influenza vaccine due, facility will give  Pneumococcal vaccine 13 due, ordered Tdap vaccine up to date. Due 12/18/2021 Shingles vaccine not in records  Advanced directives: Living will and health care power of attorney needed.  Conditions/risks identified: none  Next appointment: Dr. Alwyn RenHopper makes rounds   Preventive Care 2465 Years and Older, Female Preventive care refers to lifestyle choices and visits with your health care provider that can promote health and wellness. What does preventive care include?  A yearly physical exam. This is also called an annual well check.  Dental exams once or twice a year.  Routine eye exams. Ask your health care provider how often you should have your eyes checked.  Personal lifestyle choices, including:  Daily care of your teeth and gums.  Regular physical activity.  Eating a healthy diet.  Avoiding tobacco and drug use.  Limiting alcohol use.  Practicing safe sex.  Taking low-dose aspirin every day.  Taking vitamin and mineral supplements as recommended by your health care provider. What happens during an annual well check? The services and screenings done by your health care provider during your annual well check will depend on your age, overall health, lifestyle risk factors, and family history of disease. Counseling  Your health care provider may ask you questions about your:  Alcohol use.  Tobacco  use.  Drug use.  Emotional well-being.  Home and relationship well-being.  Sexual activity.  Eating habits.  History of falls.  Memory and ability to understand (cognition).  Work and work Astronomerenvironment.  Reproductive health. Screening  You may have the following tests or measurements:  Height, weight, and BMI.  Blood pressure.  Lipid and cholesterol levels. These may be checked every 5 years, or more frequently if you are over 66 years old.  Skin check.  Lung cancer screening. You may have this screening every year starting at age 955 if you have a 30-pack-year history of smoking and currently smoke or have quit within the past 15 years.  Fecal occult blood test (FOBT) of the stool. You may have this test every year starting at age 66.  Flexible sigmoidoscopy or colonoscopy. You may have a sigmoidoscopy every 5 years or a colonoscopy every 10 years starting at age 66.  Hepatitis C blood test.  Hepatitis B blood test.  Sexually transmitted disease (STD) testing.  Diabetes screening. This is done by checking your blood sugar (glucose) after you have not eaten for a while (fasting). You may have this done every 1-3 years.  Bone density scan. This is done to screen for osteoporosis. You may have this done starting at age 66.  Mammogram. This may be done every 1-2 years. Talk to your health care provider about how often you should have regular mammograms. Talk with your health care provider about your test results, treatment options, and if necessary, the need for more tests. Vaccines  Your health care provider may recommend certain vaccines, such as:  Influenza vaccine. This is recommended every year.  Tetanus, diphtheria, and acellular  pertussis (Tdap, Td) vaccine. You may need a Td booster every 10 years.  Zoster vaccine. You may need this after age 19.  Pneumococcal 13-valent conjugate (PCV13) vaccine. One dose is recommended after age 42.  Pneumococcal  polysaccharide (PPSV23) vaccine. One dose is recommended after age 45. Talk to your health care provider about which screenings and vaccines you need and how often you need them. This information is not intended to replace advice given to you by your health care provider. Make sure you discuss any questions you have with your health care provider. Document Released: 02/09/2015 Document Revised: 10/03/2015 Document Reviewed: 11/14/2014 Elsevier Interactive Patient Education  2017 [place] Prevention in the Home Falls can cause injuries. They can happen to people of all ages. There are many things you can do to make your home safe and to help prevent falls. What can I do on the outside of my home?  Regularly fix the edges of walkways and driveways and fix any cracks.  Remove anything that might make you trip as you walk through a door, such as a raised step or threshold.  Trim any bushes or trees on the path to your home.  Use bright outdoor lighting.  Clear any walking paths of anything that might make someone trip, such as rocks or tools.  Regularly check to see if handrails are loose or broken. Make sure that both sides of any steps have handrails.  Any raised decks and porches should have guardrails on the edges.  Have any leaves, snow, or ice cleared regularly.  Use sand or salt on walking paths during winter.  Clean up any spills in your garage right away. This includes oil or grease spills. What can I do in the bathroom?  Use night lights.  Install grab bars by the toilet and in the tub and shower. Do not use towel bars as grab bars.  Use non-skid mats or decals in the tub or shower.  If you need to sit down in the shower, use a plastic, non-slip stool.  Keep the floor dry. Clean up any water that spills on the floor as soon as it happens.  Remove soap buildup in the tub or shower regularly.  Attach bath mats securely with double-sided non-slip rug  tape.  Do not have throw rugs and other things on the floor that can make you trip. What can I do in the bedroom?  Use night lights.  Make sure that you have a light by your bed that is easy to reach.  Do not use any sheets or blankets that are too big for your bed. They should not hang down onto the floor.  Have a firm chair that has side arms. You can use this for support while you get dressed.  Do not have throw rugs and other things on the floor that can make you trip. What can I do in the kitchen?  Clean up any spills right away.  Avoid walking on wet floors.  Keep items that you use a lot in easy-to-reach places.  If you need to reach something above you, use a strong step stool that has a grab bar.  Keep electrical cords out of the way.  Do not use floor polish or wax that makes floors slippery. If you must use wax, use non-skid floor wax.  Do not have throw rugs and other things on the floor that can make you trip. What can I do with my stairs?  Do not leave any items on the stairs.  Make sure that there are handrails on both sides of the stairs and use them. Fix handrails that are broken or loose. Make sure that handrails are as long as the stairways.  Check any carpeting to make sure that it is firmly attached to the stairs. Fix any carpet that is loose or worn.  Avoid having throw rugs at the top or bottom of the stairs. If you do have throw rugs, attach them to the floor with carpet tape.  Make sure that you have a light switch at the top of the stairs and the bottom of the stairs. If you do not have them, ask someone to add them for you. What else can I do to help prevent falls?  Wear shoes that:  Do not have high heels.  Have rubber bottoms.  Are comfortable and fit you well.  Are closed at the toe. Do not wear sandals.  If you use a stepladder:  Make sure that it is fully opened. Do not climb a closed stepladder.  Make sure that both sides of the  stepladder are locked into place.  Ask someone to hold it for you, if possible.  Clearly mark and make sure that you can see:  Any grab bars or handrails.  First and last steps.  Where the edge of each step is.  Use tools that help you move around (mobility aids) if they are needed. These include:  Canes.  Walkers.  Scooters.  Crutches.  Turn on the lights when you go into a dark area. Replace any light bulbs as soon as they burn out.  Set up your furniture so you have a clear path. Avoid moving your furniture around.  If any of your floors are uneven, fix them.  If there are any pets around you, be aware of where they are.  Review your medicines with your doctor. Some medicines can make you feel dizzy. This can increase your chance of falling. Ask your doctor what other things that you can do to help prevent falls. This information is not intended to replace advice given to you by your health care provider. Make sure you discuss any questions you have with your health care provider. Document Released: 11/09/2008 Document Revised: 06/21/2015 Document Reviewed: 02/17/2014 Elsevier Interactive Patient Education  2017 [employer].

## 2016-11-28 ENCOUNTER — Encounter: Payer: Self-pay | Admitting: Adult Health

## 2016-11-28 ENCOUNTER — Non-Acute Institutional Stay (SKILLED_NURSING_FACILITY): Payer: Medicare Other | Admitting: Adult Health

## 2016-11-28 DIAGNOSIS — Z7901 Long term (current) use of anticoagulants: Secondary | ICD-10-CM

## 2016-11-28 DIAGNOSIS — Z8673 Personal history of transient ischemic attack (TIA), and cerebral infarction without residual deficits: Secondary | ICD-10-CM

## 2016-11-28 LAB — POCT INR: INR: 1.6 — AB (ref 0.9–1.1)

## 2016-11-28 LAB — PROTIME-INR: Protime: 16.2 — AB (ref 10.0–13.8)

## 2016-11-28 NOTE — Progress Notes (Signed)
Subjective:     Indication: CVA Bleeding signs/symptoms: None Thromboembolic signs/symptoms: None  Missed Coumadin doses: This week - 3 Medication changes: no Dietary changes: no Bacterial/viral infection: no Other concerns: no  The following portions of the patient's history were reviewed and updated as appropriate: allergies, current medications, past family history, past medical history, past social history, past surgical history and problem list.  Review of Systems A comprehensive review of systems was negative.   Objective:    INR Today: 1.57 Current dose:   Coumadin 7.5 Q TTh and 5 mg Q MWFSatSun  Assessment:    Subtherapeutic INR for goal of 2-3   Plan:    1. New dose: Coumadin 7.5 mg Q D X 2 days then 5 mg Q D    2. Next INR: 12/01/16

## 2016-12-01 ENCOUNTER — Non-Acute Institutional Stay (SKILLED_NURSING_FACILITY): Payer: Medicare Other | Admitting: Adult Health

## 2016-12-01 ENCOUNTER — Encounter: Payer: Self-pay | Admitting: Adult Health

## 2016-12-01 DIAGNOSIS — E034 Atrophy of thyroid (acquired): Secondary | ICD-10-CM | POA: Diagnosis not present

## 2016-12-01 DIAGNOSIS — Z8673 Personal history of transient ischemic attack (TIA), and cerebral infarction without residual deficits: Secondary | ICD-10-CM | POA: Diagnosis not present

## 2016-12-01 DIAGNOSIS — F339 Major depressive disorder, recurrent, unspecified: Secondary | ICD-10-CM | POA: Diagnosis not present

## 2016-12-01 DIAGNOSIS — Z7901 Long term (current) use of anticoagulants: Secondary | ICD-10-CM

## 2016-12-01 DIAGNOSIS — F0150 Vascular dementia without behavioral disturbance: Secondary | ICD-10-CM | POA: Diagnosis not present

## 2016-12-01 DIAGNOSIS — I10 Essential (primary) hypertension: Secondary | ICD-10-CM | POA: Diagnosis not present

## 2016-12-01 LAB — POCT INR: INR: 2 — AB (ref 0.9–1.1)

## 2016-12-01 LAB — PROTIME-INR: PROTIME: 22.3 — AB (ref 10.0–13.8)

## 2016-12-01 NOTE — Progress Notes (Signed)
DATE:  12/01/2016   MRN:  [id number]  BIRTHDAY: 05-09-1950  Facility:  Nursing Home Location:  [facility] Room Number: 218-A  LEVEL OF CARE:  SNF (31)  Contact Information    Name Relation Home Work Mobile   Jones,[name] Relative [phone]     Jones,[name] Relative   [phone]   Jones,Jerel Relative   [phone]       Code Status History    Date Active Date Inactive Code Status Order ID Comments User Context   09/12/2016 23:46 09/26/2016 22:46 Full Code [id number]  Clydie BraunSmith, Rondell A, MD ED   05/31/2016 04:01 06/04/2016 17:43 Full Code [id number]  Pearson GrippeKim, James, MD Inpatient   06/28/2015 00:08 07/03/2015 20:52 Full Code [id number]  Lorretta HarpNiu, Xilin, MD ED   12/11/2014 10:19 12/11/2014 15:36 Full Code [id number]  Mack Hookhompson, David, MD Inpatient       Chief Complaint  Patient presents with  . Medical Management of Chronic Issues    Routine SNF visit    HISTORY OF PRESENT ILLNESS:  This is a 65-YO female seen for a routine nursing home visit.  She is a [facility] care [facility].  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 She was seen in the room today. She knows she has dementia and said that she is making her best friend as her power of attorney. Latest INR 2.0, therapeutic.    PAST MEDICAL HISTORY:  Past Medical History:  Diagnosis Date  . COPD (chronic obstructive pulmonary disease) (HCC)    on CT scan 06/28/2015  . Dementia   . Diabetes mellitus   . Hypertension   . Hypothyroidism   . Idiopathic acute pancreatitis 09/16/2011  . 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 Memory loss   . Pancreatitis 2010  . Tobacco abuse   . Trigger finger      CURRENT MEDICATIONS: Reviewed    Medication List        Accurate as of 12/01/16  9:31 AM. Always use your most recent med list.            amLODipine 10 MG tablet Commonly known as:  NORVASC   atorvastatin 40 MG tablet Commonly known as:  LIPITOR   donepezil 10 MG tablet Commonly known as:  ARICEPT Take 1 tablet (10 mg total) by mouth at bedtime.   levothyroxine 75 MCG tablet Commonly known as:  SYNTHROID, LEVOTHROID   mirtazapine 15 MG tablet Commonly known as:  REMERON   sertraline 100 MG tablet Commonly known as:  ZOLOFT Take 1 tablet (100 mg total) by mouth 2 (two) times daily.        No Known Allergies   REVIEW OF SYSTEMS:  GENERAL: no change in appetite, no fatigue, no weight changes, no fever, chills or weakness MOUTH and THROAT: Denies oral discomfort, gingival pain or bleeding RESPIRATORY: no cough, SOB, DOE, wheezing, hemoptysis CARDIAC: no chest pain, edema or palpitations GI: no abdominal pain, diarrhea, constipation, heart burn, nausea or vomiting GU: Denies dysuria, frequency, hematuria, incontinence, or discharge PSYCHIATRIC: Denies feeling of depression or anxiety. No report of hallucinations, insomnia, paranoia, or agitation     PHYSICAL EXAMINATION  GENERAL APPEARANCE: Well nourished. In no acute distress. Obese SKIN:  Skin is warm and dry.  MOUTH and THROAT: Lips are without lesions. Oral mucosa is moist and without lesions. Tongue is normal in  shape, size, and color and without lesions RESPIRATORY: breathing is even & unlabored, BS CTAB CARDIAC: RRR, no murmur,no extra heart sounds, no edema GI: abdomen soft, normal BS, no masses, no tenderness, no hepatomegaly, no splenomegaly EXTREMITIES:  Able to move X 4 extremities PSYCHIATRIC: Alert to self, disoriented to time and place. Affect and behavior are appropriate   LABS/RADIOLOGY: Labs reviewed: Basic Metabolic Panel: Recent Labs    09/15/16 0807 09/18/16 0851 09/25/16 0549  NA 140 141 142  K 4.1 3.8 3.6  CL 112* 109 111  CO2 22 22 23   GLUCOSE 97 135* 92  BUN 15 18 16   CREATININE 0.97 1.06* 0.97   CALCIUM 8.9 9.3 9.0   Liver Function Tests: Recent Labs    05/31/16 0427 06/04/16 1043 06/06/16 09/12/16 2010  AST 30 24 24  57*  ALT 24 18 17  32  ALKPHOS 74 78 82 86  BILITOT 0.8 0.4  --  0.5  PROT 7.1 6.6  --  8.3*  ALBUMIN 3.3* 3.2*  --  4.3   CBC: Recent Labs    05/30/16 1812  06/06/16 09/12/16 2010  09/14/16 0559 09/15/16 0807 09/24/16 0518  WBC 6.1   < > 6.0 8.0   < > 6.1 5.0 5.4  NEUTROABS 2.8  --  3 6.0  --   --   --   --   HGB 13.6   < > 13.7 13.5   < > 12.5 12.6 12.1  HCT 43.6   < > 44 41.2   < > 39.1 38.2 36.5  MCV 89.2   < >  --  84.8   < > 83.7 82.2 82.8  PLT 132*   < > 160 176   < > 149* 149* 189   < > = values in this interval not displayed.   Lipid Panel: Recent Labs    05/31/16 0427  HDL 44   CBG: Recent Labs    09/26/16 0736 09/26/16 1152 09/26/16 1712  GLUCAP 105* 120* 117*      ASSESSMENT/PLAN:  1. Hypothyroidism due to acquired atrophy of thyroid - continue levothyroxine 75 g 1 tab daily Lab Results  Component Value Date   TSH 0.62 08/08/2016    2. Benign essential HTN - well-controlled, continue amlodipine 10 mg 1 tab daily   3. History of CVA (cerebrovascular accident) - stable, continue amlodipine 10 mg 1 tab daily, atorvastatin 40 mg 1 tab daily and Coumadin 5 mg daily   4. Depression, recurrent (HCC) - mood is stable, continue sertraline 100 mg 1 tablet twice a day and mirtazapine by 15 mg 1 tab daily at bedtime   5. Vascular dementia without behavioral disturbance - continue donepezil 10 mg 1 tab daily at bedtime, fall precautions   6. Long term current use of anticoagulant - INR 2.0, therapeutic level, continue Coumadin 5 mg 1 tab daily and check INR on 12/05/16     Goals of care:  Long-term care    Monina C. Medina-Vargas - NP    [employer] [phone]

## 2016-12-09 LAB — PROTIME-INR: Protime: 25.6 — AB (ref 10.0–13.8)

## 2016-12-09 LAB — POCT INR: INR: 2.4 — AB (ref 0.9–1.1)

## 2016-12-16 LAB — PROTIME-INR: Protime: 28.1 — AB (ref 10.0–13.8)

## 2016-12-16 LAB — POCT INR: INR: 2.7 — AB (ref 0.9–1.1)

## 2016-12-22 LAB — PROTIME-INR: Protime: 31 — AB (ref 10.0–13.8)

## 2016-12-22 LAB — POCT INR: INR: 3.1 — AB (ref 0.9–1.1)

## 2016-12-23 ENCOUNTER — Encounter: Payer: Self-pay | Admitting: Adult Health

## 2016-12-23 ENCOUNTER — Non-Acute Institutional Stay (SKILLED_NURSING_FACILITY): Payer: Medicare Other | Admitting: Adult Health

## 2016-12-23 DIAGNOSIS — Z7901 Long term (current) use of anticoagulants: Secondary | ICD-10-CM | POA: Diagnosis not present

## 2016-12-23 DIAGNOSIS — Z8673 Personal history of transient ischemic attack (TIA), and cerebral infarction without residual deficits: Secondary | ICD-10-CM | POA: Diagnosis not present

## 2016-12-23 LAB — POCT INR: INR: 3 — AB (ref 0.9–1.1)

## 2016-12-23 LAB — PROTIME-INR: PROTIME: 30.6 — AB (ref 10.0–13.8)

## 2016-12-26 NOTE — Progress Notes (Signed)
Subjective:     Indication: CVA Bleeding signs/symptoms: None Thromboembolic signs/symptoms: None  Missed Coumadin doses: 1 due to suprtherapeutic INR Medication changes: no Dietary changes: no Bacterial/viral infection: no Other concerns: no  The following portions of the patient's history were reviewed and updated as appropriate: allergies, current medications, past family history, past medical history, past social history, past surgical history and problem list.  Review of Systems A comprehensive review of systems was negative.   Objective:    INR Today: 3.0 Current dose:  Coumadin 5 mg daily  Assessment:    Therapeutic INR for goal of 2-3   Plan:    1. New dose: Coumadin 5mg  Q D on Mon, Tues, Wed, Thurs, Fri, Sat, Sun, Coumadin 4 mg Q Tuedays   2. Next INR:   12/26/16

## 2017-01-01 ENCOUNTER — Encounter: Payer: Self-pay | Admitting: Adult Health

## 2017-01-01 ENCOUNTER — Non-Acute Institutional Stay (SKILLED_NURSING_FACILITY): Payer: Medicare Other | Admitting: Adult Health

## 2017-01-01 DIAGNOSIS — Z8673 Personal history of transient ischemic attack (TIA), and cerebral infarction without residual deficits: Secondary | ICD-10-CM | POA: Diagnosis not present

## 2017-01-01 DIAGNOSIS — Z7901 Long term (current) use of anticoagulants: Secondary | ICD-10-CM | POA: Diagnosis not present

## 2017-01-01 LAB — POCT INR: INR: 1.9 — AB (ref 0.9–1.1)

## 2017-01-01 LAB — PROTIME-INR: Protime: 21.6 — AB (ref 10.0–13.8)

## 2017-01-01 NOTE — Progress Notes (Signed)
Subjective:     Indication: Hx of CVA Bleeding signs/symptoms: None Thromboembolic signs/symptoms: None  Missed Coumadin doses: None Medication changes: no Dietary changes: no Bacterial/viral infection: no Other concerns: no  The following portions of the patient's history were reviewed and updated as appropriate: allergies, current medications, past family history, past medical history, past social history, past surgical history and problem list.  Review of Systems A comprehensive review of systems was negative.   Objective:    INR Today: 1.9 Current dose:  Coumadin 5 mg daily  Assessment:    Subtherapeutic INR for goal of 2-3   Plan:    1. New dose: Coumadin 6 mg on Thursdays, 5 mg on Mondays, Tuesdays, Wednesdays, Fridays, Saturdays and Sundays    2. Next INR:   01/05/17  Kenard GowerMonina Medina-Vargas, NP [facility] [phone] (Monday-Friday 8:00 a.m. - 5:00 p.m.) [phone] (after hours)

## 2017-01-07 LAB — POCT INR: INR: 2.2 — AB (ref 0.9–1.1)

## 2017-01-07 LAB — PROTIME-INR: PROTIME: 23.7 — AB (ref 10.0–13.8)

## 2017-01-08 ENCOUNTER — Non-Acute Institutional Stay (SKILLED_NURSING_FACILITY): Payer: Medicare Other | Admitting: Internal Medicine

## 2017-01-08 ENCOUNTER — Encounter: Payer: Self-pay | Admitting: Internal Medicine

## 2017-01-08 DIAGNOSIS — E1149 Type 2 diabetes mellitus with other diabetic neurological complication: Secondary | ICD-10-CM | POA: Diagnosis not present

## 2017-01-08 DIAGNOSIS — R413 Other amnesia: Secondary | ICD-10-CM

## 2017-01-08 DIAGNOSIS — Z8673 Personal history of transient ischemic attack (TIA), and cerebral infarction without residual deficits: Secondary | ICD-10-CM | POA: Diagnosis not present

## 2017-01-08 DIAGNOSIS — E113293 Type 2 diabetes mellitus with mild nonproliferative diabetic retinopathy without macular edema, bilateral: Secondary | ICD-10-CM | POA: Diagnosis not present

## 2017-01-08 DIAGNOSIS — E034 Atrophy of thyroid (acquired): Secondary | ICD-10-CM | POA: Diagnosis not present

## 2017-01-08 DIAGNOSIS — F418 Other specified anxiety disorders: Secondary | ICD-10-CM | POA: Diagnosis not present

## 2017-01-08 NOTE — Assessment & Plan Note (Signed)
Neurology reassessment to assess stability/progression

## 2017-01-08 NOTE — Patient Instructions (Signed)
See assessment and plan under each diagnosis in the problem list and acutely for this visit 

## 2017-01-08 NOTE — Progress Notes (Signed)
NURSING HOME LOCATION:  [facility] ROOM NUMBER:  [id number]  CODE STATUS:  Full Code  PCP:  Kirby FunkGriffin, John, MD  [address] [place] [id number]  This is a nursing facility follow up of chronic medical diagnoses    Interim medical record and care since [facility] was updated with review of diagnostic studies and change in clinical status since last visit were documented.  HPI: The patient was hospitalized 8/17- 09/26/16 for acute kidney injury with creatinine of 2.69 in the context of poor oral intake and urinary frequency. Hospital admission was specifically for rehydration. Baseline creatinine was felt to be less than 1.1. The patient has a diagnosis of progressive vascular dementia. CT revealed no acute change. The patient was admitted to the SNF as per recommendation of social work staff who felt the home situation was unsafe and the patient could not live alone due to her dementia. She had previously seen Dr. Shon MilletAdam Jaffe , [facility]. Memory issues began several months after her mother died of pancreatic cancer in February 2009. She was very close to her mother and this was devastating. She has had depression since that time. MRI of the brain 02/22/14 revealed mild chronic small vessel ischemic changes with old lacunar cerebellar infarcts. 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 Patient remains on chronic anticoagulation. INR was 1.9 on 12/6 and 2.2 on 12/12.  Other diagnoses include COPD, essential hypertension, and dyslipidemia. At the SNF fasting glucoses range  from 124 up to a high of 164. Last A1c was 6.8% on 09/13/16, indicating good control. Last TSH on record was 08/08/16 with a value of 0.62 on L-thyroxine 75 g daily. The patient has a history of anxiety/depression. She is presently on sertraline 100 mg 2 times a day  Review of systems: Dementia impacted veracity of responses. Date given correctly as 01/08/2017 which is her birthday. She was able to name the president but not the governor. She stated there was " no governor of [place] or of the 50 states". She states that she's had dementia for over a year. She was able to name her PCP, Dr. Valentina LucksGriffin; but she is unable to tell me what neuropsychiatric evaluation has been completed. She denies any active symptoms except for some blurring or decrease of lateral vision in the right eye. She states that she is upset that she has to live in the skilled nursing facility. She has no family in this community and [place] from [place]. She states a friend does take her out of the facility on weekends. Her friend works during the week.  Constitutional: No fever,significant weight change, fatigue  Eyes: No redness, discharge, pain ENT/mouth: No nasal congestion,  purulent discharge, earache,change in hearing ,sore throat  Cardiovascular: No chest pain, palpitations,paroxysmal nocturnal dyspnea, claudication, edema  Respiratory: No cough, sputum production,hemoptysis, DOE , significant snoring,apnea  Gastrointestinal: No heartburn,dysphagia,abdominal pain, nausea / vomiting,rectal bleeding, melena,change in bowels Genitourinary: No dysuria,hematuria, pyuria,  incontinence, nocturia Musculoskeletal: No joint stiffness, joint swelling, weakness,pain Dermatologic: No rash, pruritus, change in appearance of skin Neurologic: No dizziness,headache,syncope, seizures, numbness , tingling Endocrine: No change in hair/skin/ nails, excessive thirst, excessive hunger, excessive urination  Hematologic/lymphatic: No significant  bruising, lymphadenopathy,abnormal bleeding Allergy/immunology: No itchy/ watery eyes, significant sneezing, urticaria, angioedema  Physical exam:  Pertinent or positive findings: She is very pleasant and communicative. She describes having dementia in a monotone. Has an upper plate. Eyebrows are decreased laterally. Field of vision testing was grossly normal without deficit in the right lateral visual field.  Pedal pulses are strong. She has some clubbing of the nailbeds. Fusiform change of the third right PIP present. She has fusiform changes of the knees.  General appearance:Adequately nourished; no acute distress , increased work of breathing is present.   Lymphatic: No lymphadenopathy about the head, neck, axilla . Eyes: No conjunctival inflammation or lid edema is present. There is no scleral icterus. Ears:  External ear exam shows no significant lesions or deformities.   Nose:  External nasal examination shows no deformity or inflammation. Nasal mucosa are pink and moist without lesions ,exudates Oral exam: lips and gums are healthy appearing.There is no oropharyngeal erythema or exudate . Neck:  No thyromegaly, masses, tenderness noted.    Heart:  Normal rate and regular rhythm. S1 and S2 normal without gallop, murmur, click, rub .  Lungs:Chest clear to auscultation without wheezes, rhonchi,rales , rubs. Abdomen:Bowel sounds are normal. Abdomen is soft and nontender with no organomegaly, hernias,masses. GU: deferred  Extremities:  No cyanosis, edema  Neurologic exam : Strength equal  in upper & lower extremities Deep tendon reflexes are equal Skin: Warm & dry w/o tenting. No significant lesions or rash.  See summary under each active problem in the Problem List with associated updated therapeutic plan

## 2017-01-08 NOTE — Assessment & Plan Note (Signed)
PT INR

## 2017-01-08 NOTE — Assessment & Plan Note (Signed)
01/08/17 affect is flat but the patient is neither clinically agitated or depressed. High-dose SSRI will be weaned down to 100 mg daily.

## 2017-01-08 NOTE — Assessment & Plan Note (Addendum)
Update A1c Check BMET to assess possible role for metformin

## 2017-01-08 NOTE — Assessment & Plan Note (Signed)
Update TSH as it has been low serially

## 2017-01-08 NOTE — Assessment & Plan Note (Signed)
Ophthalmologic follow-up @ next  SNF site visit

## 2017-01-09 LAB — BASIC METABOLIC PANEL
BUN: 21 (ref 4–21)
CREATININE: 0.8 (ref 0.5–1.1)
Glucose: 112
Potassium: 3.4 (ref 3.4–5.3)
Sodium: 137 (ref 137–147)

## 2017-01-09 LAB — HEMOGLOBIN A1C: HEMOGLOBIN A1C: 6.8

## 2017-01-14 LAB — POCT INR: INR: 1.6 — AB (ref 0.9–1.1)

## 2017-01-14 LAB — PROTIME-INR: PROTIME: 18.9 — AB (ref 10.0–13.8)

## 2017-01-15 ENCOUNTER — Ambulatory Visit: Payer: Self-pay | Admitting: Neurology

## 2017-01-16 LAB — POCT INR: INR: 1.9 — AB (ref 0.9–1.1)

## 2017-01-16 LAB — PROTIME-INR: PROTIME: 21.1 — AB (ref 10.0–13.8)

## 2017-01-19 LAB — PROTIME-INR: Protime: 29.7 — AB (ref 10.0–13.8)

## 2017-01-19 LAB — POCT INR: INR: 2.9 — AB (ref 0.9–1.1)

## 2017-01-30 ENCOUNTER — Non-Acute Institutional Stay (SKILLED_NURSING_FACILITY): Payer: Medicare Other | Admitting: Adult Health

## 2017-01-30 ENCOUNTER — Encounter: Payer: Self-pay | Admitting: Adult Health

## 2017-01-30 DIAGNOSIS — F418 Other specified anxiety disorders: Secondary | ICD-10-CM

## 2017-01-30 DIAGNOSIS — I10 Essential (primary) hypertension: Secondary | ICD-10-CM

## 2017-01-30 DIAGNOSIS — E034 Atrophy of thyroid (acquired): Secondary | ICD-10-CM

## 2017-01-30 DIAGNOSIS — E1149 Type 2 diabetes mellitus with other diabetic neurological complication: Secondary | ICD-10-CM

## 2017-01-30 DIAGNOSIS — F0150 Vascular dementia without behavioral disturbance: Secondary | ICD-10-CM | POA: Diagnosis not present

## 2017-01-30 DIAGNOSIS — Z8673 Personal history of transient ischemic attack (TIA), and cerebral infarction without residual deficits: Secondary | ICD-10-CM

## 2017-01-30 LAB — POCT INR: INR: 1.9 — AB (ref 0.9–1.1)

## 2017-01-30 LAB — PROTIME-INR: Protime: 21.1 — AB (ref 10.0–13.8)

## 2017-01-30 NOTE — Progress Notes (Signed)
Location:  [facility] Room Number: 218-A Place of Service:  SNF (31) Provider:  Kenard Gower, NP  Patient Care Team: Kirby Funk, MD as PCP - General (Internal Medicine) Jamas Lav, MD as Consulting Physician (Psychiatry) Trey Sailors, MD as Consulting Physician (Neurosurgery)  Extended Emergency Contact Information Primary Emergency Contact: Octavio Manns [place] Home Phone: [phone] Relation: Relative Secondary Emergency Contact: Cecille Aver [place] Mobile Phone: [phone] Relation: Relative  Code Status:  Full Code  Goals of care: Advanced Directive information Advanced Directives 11/25/2016  Does Patient Have a Medical Advance Directive? No  Type of Advance Directive -  Does patient want to make changes to medical advance directive? -  Would patient like information on creating a medical advance directive? No - Patient declined  Pre-existing out of facility DNR order (yellow form or pink MOST form) -     Chief Complaint  Patient presents with  . Medical Management of Chronic Issues    Routine [facility] visit    HPI:  Pt is a 67 y.o. female seen today for medical management of chronic diseases.  She is a long-term care resident of [facility].  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 She was seen in the room today with room dark while watching television. She said that she prefers it dark (it was 11AM). She was friendly and happy.    Past Medical History:  Diagnosis Date  . COPD (chronic obstructive pulmonary disease) (HCC)    on CT scan 06/28/2015  . Dementia   . Diabetes mellitus   . Hypertension   . Hypothyroidism   . Idiopathic acute pancreatitis 09/16/2011  . 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  Memory loss   . Pancreatitis 2010  . Tobacco abuse   . Trigger finger    Past Surgical History:  Procedure Laterality Date  . BACK SURGERY    . CHOLECYSTECTOMY  2003  . CYST REMOVAL HAND Left 12/11/2014   Procedure: LEFT RING FINGER TENDON SHEATH CYST EXCISION;  Surgeon: Mack Hook, MD;  Location: [facility];  Service: Orthopedics;  Laterality: Left;  . SPINE SURGERY  2006   Dr. Trey Sailors - L3-L4, L4-L5 laminotomy and foraminotomy  . TEE WITHOUT CARDIOVERSION N/A 07/03/2015   Procedure: TRANSESOPHAGEAL ECHOCARDIOGRAM (TEE);  Surgeon: Quintella Reichert, MD;  Location: [facility] ENDOSCOPY;  Service: Cardiovascular;  Laterality: N/A;    No Known Allergies  Outpatient Encounter Medications as of 01/30/2017  Medication Sig  . amLODipine (NORVASC) 10 MG tablet Take 10 mg by mouth daily.  Marland Kitchen atorvastatin (LIPITOR) 40 MG tablet Take 40 mg by mouth daily.  Marland Kitchen donepezil (ARICEPT) 10 MG tablet Take 1 tablet (10 mg total) by mouth at bedtime.  Marland Kitchen levothyroxine (SYNTHROID, LEVOTHROID) 75 MCG tablet Take 75 mcg by mouth daily before breakfast.  . mirtazapine (REMERON) 15 MG tablet Take 15 mg by mouth at bedtime.   . sertraline (ZOLOFT) 100 MG tablet Take 1 tablet (100 mg total) by mouth 2 (two) times daily.  Marland Kitchen warfarin (COUMADIN) 1 MG tablet Take 0.5 mg by mouth See admin instructions. Take 0.5 mg Sat/Sun along with a 6 mg tablet  . warfarin (COUMADIN) 6 MG tablet Take 6 mg by mouth See admin instructions. Coumadin 1 tablet (6 mg) qd M/T/W/TH/F.  Take a 6 mg tablet along with 0.5  of a 1 mg tablet to = 6.5 mg Sat/Sun  . [DISCONTINUED] warfarin (COUMADIN) 5 MG tablet Take 5 mg by mouth See admin instructions. Give every day except Thursday   No facility-administered encounter medications on file as of 01/30/2017.     Review of Systems  GENERAL: No change in appetite, no fatigue, no weight changes, no fever, chills or weakness MOUTH and THROAT: Denies oral discomfort, gingival pain or  bleeding RESPIRATORY: no cough, SOB, DOE, wheezing, hemoptysis CARDIAC: No chest pain, edema or palpitations GI: No abdominal pain, diarrhea, constipation, heart burn, nausea or vomiting GU: Denies dysuria, frequency, hematuria, incontinence, or discharge PSYCHIATRIC: Denies feelings of depression or anxiety. No report of hallucinations, insomnia, paranoia, or agitation   Immunization History  Administered Date(s) Administered  . Influenza Split 11/03/2011  . Influenza,inj,Quad PF,6+ Mos 11/17/2012  . Pneumococcal-Unspecified 12/08/2016  . Tdap 12/19/2011   Pertinent  Health Maintenance Due  Topic Date Due  . MAMMOGRAM  06/06/2017 (Originally 02/25/2016)  . DEXA SCAN  06/06/2017 (Originally 01/09/2016)  . FOOT EXAM  07/23/2017 (Originally 02/15/2013)  . OPHTHALMOLOGY EXAM  07/23/2017 (Originally 04/28/2013)  . INFLUENZA VACCINE  10/27/2017 (Originally 08/27/2016)  . COLONOSCOPY  07/05/2025 (Originally 01/08/2001)  . HEMOGLOBIN A1C  07/10/2017  . PNA vac Low Risk Adult (2 of 2 - PCV13) 12/08/2017   Fall Risk  11/25/2016 05/04/2015 09/23/2013  Falls in the past year? No No No      Vitals:   01/30/17 1001  BP: 120/68  Pulse: 72  Resp: 20  Temp: (!) 97.5 F (36.4 C)  TempSrc: Oral  SpO2: 98%  Weight: 215 lb 12.8 oz (97.9 kg)  Height: 5\' 5"  (1.651 m)   Body mass index is 35.91 kg/m.  Physical Exam  GENERAL APPEARANCE: Well nourished. In no acute distress. Obese SKIN:  Skin is warm and dry.  MOUTH and THROAT: Lips are without lesions. Oral mucosa is moist and without lesions. RESPIRATORY: Breathing is even & unlabored, BS CTAB CARDIAC: RRR, no murmur,no extra heart sounds, no edema GI: Abdomen soft, normal BS, no masses, no tenderness EXTREMITIES:  Able to move X 4 extremities PSYCHIATRIC: Alert to self, disoriented to time and place. Affect and behavior are appropriate   Labs reviewed: Recent Labs    09/15/16 0807 09/18/16 0851 09/25/16 0549 01/09/17  NA 140 141 142  137  K 4.1 3.8 3.6 3.4  CL 112* 109 111  --   CO2 22 22 23   --   GLUCOSE 97 135* 92  --   BUN 15 18 16 21   CREATININE 0.97 1.06* 0.97 0.8  CALCIUM 8.9 9.3 9.0  --    Recent Labs    05/31/16 0427 06/04/16 1043 06/06/16 09/12/16 2010  AST 30 24 24  57*  ALT 24 18 17  32  ALKPHOS 74 78 82 86  BILITOT 0.8 0.4  --  0.5  PROT 7.1 6.6  --  8.3*  ALBUMIN 3.3* 3.2*  --  4.3   Recent Labs    05/30/16 1812  06/06/16 09/12/16 2010  09/14/16 0559 09/15/16 0807 09/24/16 0518  WBC 6.1   < > 6.0 8.0   < > 6.1 5.0 5.4  NEUTROABS 2.8  --  3 6.0  --   --   --   --   HGB 13.6   < > 13.7 13.5   < > 12.5 12.6 12.1  HCT 43.6   < > 44 41.2   < > 39.1 38.2 36.5  MCV  89.2   < >  --  84.8   < > 83.7 82.2 82.8  PLT 132*   < > 160 176   < > 149* 149* 189   < > = values in this interval not displayed.   Lab Results  Component Value Date   TSH 0.62 08/08/2016   Lab Results  Component Value Date   HGBA1C 6.8 01/09/2017   Lab Results  Component Value Date   CHOL 160 05/31/2016   HDL 44 05/31/2016   LDLCALC 101 (H) 05/31/2016   TRIG 76 05/31/2016   CHOLHDL 3.6 05/31/2016    Assessment/Plan  1. Benign essential HTN - well-controlled, continue Amlodipine 10 mg 1 tab daily   2. Hypothyroidism due to acquired atrophy of thyroid - continue Levothyroxine 75 mcg 1 tab daily Lab Results  Component Value Date   TSH 0.62 08/08/2016     3. History of CVA (cerebrovascular accident) - stable, continue Coumadin 6 mg 1 tab every Mondays, Tuesdays, Wednesdays, Thursdays, and Fridays and 6.5 mg every Saturdays and Sundays, check INR today, atorvastatin 40 mg 1 tab daily   4. Type II diabetes mellitus with neurological manifestations (HCC) - diet-controlled Lab Results  Component Value Date   HGBA1C 6.8 01/09/2017    5. Depression with anxiety - mood is stable, continue sertraline 100 mg 1 tab daily and mirtazapine 15 mg 1 tab daily at bedtime    6. Vascular dementia without behavioral  disturbance - continue donepezil 10 mg 1 tab daily at bedtime, supportive care and fall precautions     Family/ staff Communication:  Discussed plan of care with resident.  Labs/tests ordered:  Check INR today.  Goals of care:   Long-term care    Kenard GowerMonina Medina-Vargas, NP [facility] [phone] (Monday-Friday 8:00 a.m. - 5:00 p.m.) [phone] (after hours)

## 2017-02-16 LAB — POCT INR: INR: 3.1 — AB (ref 0.9–1.1)

## 2017-02-16 LAB — PROTIME-INR: PROTIME: 31 — AB (ref 10.0–13.8)

## 2017-02-20 LAB — PROTIME-INR: PROTIME: 24.9 — AB (ref 10.0–13.8)

## 2017-02-20 LAB — POCT INR: INR: 2.3 — AB (ref 0.9–1.1)

## 2017-03-02 ENCOUNTER — Encounter: Payer: Self-pay | Admitting: Adult Health

## 2017-03-02 ENCOUNTER — Non-Acute Institutional Stay (SKILLED_NURSING_FACILITY): Payer: Medicare Other | Admitting: Adult Health

## 2017-03-02 DIAGNOSIS — F0150 Vascular dementia without behavioral disturbance: Secondary | ICD-10-CM | POA: Diagnosis not present

## 2017-03-02 DIAGNOSIS — E034 Atrophy of thyroid (acquired): Secondary | ICD-10-CM

## 2017-03-02 DIAGNOSIS — E1149 Type 2 diabetes mellitus with other diabetic neurological complication: Secondary | ICD-10-CM | POA: Diagnosis not present

## 2017-03-02 DIAGNOSIS — I10 Essential (primary) hypertension: Secondary | ICD-10-CM | POA: Diagnosis not present

## 2017-03-02 DIAGNOSIS — Z8673 Personal history of transient ischemic attack (TIA), and cerebral infarction without residual deficits: Secondary | ICD-10-CM

## 2017-03-02 DIAGNOSIS — F418 Other specified anxiety disorders: Secondary | ICD-10-CM

## 2017-03-02 NOTE — Progress Notes (Signed)
Location:  [facility] Room Number: 218-A Place of Service:  SNF (31) Provider:  Kenard Gower, NP  Patient Care Team: Kirby Funk, MD as PCP - General (Internal Medicine) Jamas Lav, MD as Consulting Physician (Psychiatry) Trey Sailors, MD as Consulting Physician (Neurosurgery)  Extended Emergency Contact Information Primary Emergency Contact: Octavio Manns [place] Home Phone: [phone] Relation: Relative Secondary Emergency Contact: Cecille Aver [place] Mobile Phone: [phone] Relation: Relative  Code Status:  Full Code  Goals of care: Advanced Directive information Advanced Directives 11/25/2016  Does Patient Have a Medical Advance Directive? No  Type of Advance Directive -  Does patient want to make changes to medical advance directive? -  Would patient like information on creating a medical advance directive? No - Patient declined  Pre-existing out of facility DNR order (yellow form or pink MOST form) -     Chief Complaint  Patient presents with  . Medical Management of Chronic Issues    Routine [facility] visit    HPI:  Pt is a 67 y.o. female seen today for medical management of chronic diseases. She is a long-term care resident of [facility]. She was seen in her room today. She was noted to Have BMI 36.4, obese. She is currently on Sertraline and Mirtazapine for depression. BPs noted to be inconsistent -  157/71, 140/76, 144/78 130/84, and 120/68. She denies having headaches, chest pains nor dizziness. 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     Past Medical History:  Diagnosis Date  . COPD (chronic obstructive pulmonary disease) (HCC)    on CT scan 06/28/2015  . Dementia   . Diabetes mellitus   .  Hypertension   . Hypothyroidism   . Idiopathic acute pancreatitis 09/16/2011  . 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 Memory loss   . Pancreatitis 2010  . Tobacco abuse   . Trigger finger    Past Surgical History:  Procedure Laterality Date  . BACK SURGERY    . CHOLECYSTECTOMY  2003  . CYST REMOVAL HAND Left 12/11/2014   Procedure: LEFT RING FINGER TENDON SHEATH CYST EXCISION;  Surgeon: Mack Hook, MD;  Location: [facility];  Service: Orthopedics;  Laterality: Left;  . SPINE SURGERY  2006   Dr. Trey Sailors - L3-L4, L4-L5 laminotomy and foraminotomy  . TEE WITHOUT CARDIOVERSION N/A 07/03/2015   Procedure: TRANSESOPHAGEAL ECHOCARDIOGRAM (TEE);  Surgeon: Quintella Reichert, MD;  Location: [facility] ENDOSCOPY;  Service: Cardiovascular;  Laterality: N/A;    No Known Allergies  Outpatient Encounter Medications as of 03/02/2017  Medication Sig  . amLODipine (NORVASC) 10 MG tablet Take 10 mg by mouth daily.  Marland Kitchen atorvastatin (LIPITOR) 40 MG tablet Take 40 mg by mouth daily.  Marland Kitchen donepezil (ARICEPT) 10 MG tablet Take 1 tablet (10 mg total) by mouth at bedtime.  Marland Kitchen levothyroxine (SYNTHROID, LEVOTHROID) 75 MCG tablet Take 75 mcg by mouth daily before breakfast.  . mirtazapine (REMERON) 15 MG tablet Take 15 mg by mouth at bedtime.   . sertraline (ZOLOFT) 100 MG tablet Take 100 mg by mouth daily.  Marland Kitchen warfarin (COUMADIN) 6 MG tablet Take 6 mg by mouth daily at 6 PM.   . [DISCONTINUED] sertraline (ZOLOFT) 100 MG tablet Take 1 tablet (100 mg total) by mouth 2 (two) times daily.  . [DISCONTINUED] warfarin (COUMADIN) 1 MG tablet  Take 0.5 mg by mouth See admin instructions. Take 0.5 mg Sat/Sun along with a 6 mg tablet   No facility-administered encounter medications on file as of 03/02/2017.     Review of Systems  GENERAL: No change in appetite, no fatigue, no weight changes, no fever, chills or weakness MOUTH and THROAT: Denies oral discomfort, gingival pain or bleeding, pain from teeth or  hoarseness   RESPIRATORY: no cough, SOB, DOE, wheezing, hemoptysis CARDIAC: No chest pain, edema or palpitations GI: No abdominal pain, diarrhea, constipation, heart burn, nausea or vomiting GU: Denies dysuria, frequency, hematuria, incontinence, or discharge PSYCHIATRIC: Denies feelings of depression or anxiety. No report of hallucinations, insomnia, paranoia, or agitation   Immunization History  Administered Date(s) Administered  . Influenza Split 11/03/2011  . Influenza,inj,Quad PF,6+ Mos 11/17/2012  . Pneumococcal-Unspecified 12/08/2016  . Tdap 12/19/2011   Pertinent  Health Maintenance Due  Topic Date Due  . MAMMOGRAM  06/06/2017 (Originally 02/25/2016)  . DEXA SCAN  06/06/2017 (Originally 01/09/2016)  . FOOT EXAM  07/23/2017 (Originally 02/15/2013)  . OPHTHALMOLOGY EXAM  07/23/2017 (Originally 04/28/2013)  . INFLUENZA VACCINE  10/27/2017 (Originally 08/27/2016)  . COLONOSCOPY  07/05/2025 (Originally 01/08/2001)  . HEMOGLOBIN A1C  07/10/2017  . PNA vac Low Risk Adult (2 of 2 - PCV13) 12/08/2017   Fall Risk  11/25/2016 05/04/2015 09/23/2013  Falls in the past year? No No No      Vitals:   03/02/17 1030  BP: 140/76  Pulse: 74  Resp: 18  Temp: (!) 97 F (36.1 C)  TempSrc: Oral  SpO2: 98%  Weight: 219 lb (99.3 kg)  Height: 5\' 5"  (1.651 m)   Body mass index is 36.44 kg/m.  Physical Exam  GENERAL APPEARANCE: Well nourished. In no acute distress. Obese SKIN:  Skin is warm and dry.  MOUTH and THROAT: Lips are without lesions. Oral mucosa is moist and without lesions. Tongue is normal in shape, size, and color and without lesions RESPIRATORY: Breathing is even & unlabored, BS CTAB CARDIAC: RRR, no murmur,no extra heart sounds, no edema GI: Abdomen soft, normal BS, no masses, no tenderness EXTREMITIES:  Able to move X 4 extremities PSYCHIATRIC: Alert to self, disoriented to time and place. Affect and behavior are appropriate    Labs reviewed: Recent Labs     09/15/16 0807 09/18/16 0851 09/25/16 0549 01/09/17  NA 140 141 142 137  K 4.1 3.8 3.6 3.4  CL 112* 109 111  --   CO2 22 22 23   --   GLUCOSE 97 135* 92  --   BUN 15 18 16 21   CREATININE 0.97 1.06* 0.97 0.8  CALCIUM 8.9 9.3 9.0  --    Recent Labs    05/31/16 0427 06/04/16 1043 06/06/16 09/12/16 2010  AST 30 24 24  57*  ALT 24 18 17  32  ALKPHOS 74 78 82 86  BILITOT 0.8 0.4  --  0.5  PROT 7.1 6.6  --  8.3*  ALBUMIN 3.3* 3.2*  --  4.3   Recent Labs    05/30/16 1812  06/06/16 09/12/16 2010  09/14/16 0559 09/15/16 0807 09/24/16 0518  WBC 6.1   < > 6.0 8.0   < > 6.1 5.0 5.4  NEUTROABS 2.8  --  3 6.0  --   --   --   --   HGB 13.6   < > 13.7 13.5   < > 12.5 12.6 12.1  HCT 43.6   < > 44 41.2   < > 39.1  38.2 36.5  MCV 89.2   < >  --  84.8   < > 83.7 82.2 82.8  PLT 132*   < > 160 176   < > 149* 149* 189   < > = values in this interval not displayed.   Lab Results  Component Value Date   TSH 0.62 08/08/2016   Lab Results  Component Value Date   HGBA1C 6.8 01/09/2017   Lab Results  Component Value Date   CHOL 160 05/31/2016   HDL 44 05/31/2016   LDLCALC 101 (H) 05/31/2016   TRIG 76 05/31/2016   CHOLHDL 3.6 05/31/2016    Assessment/Plan  1. History of CVA (cerebrovascular accident) - stable, continue Amlodipine 10 mg 1 tab daily, Atorvastatin 40 mg 1 tab daily, Coumadin 6 mg daily   2. Hypothyroidism due to acquired atrophy of thyroid - continue levothyroxine 75 g 1 tab daily Lab Results  Component Value Date   TSH 0.62 08/08/2016     3. Benign essential HTN - BP is inconsistent, will continue Amlodipine 10 mg daily, check BP BID X 1 week    4. Type II diabetes mellitus with neurological manifestations (HCC) - diet-controlled Lab Results  Component Value Date   HGBA1C 6.8 01/09/2017     5. Depression with anxiety - Body mass index is 36.44 kg/m., obese, will decrease Mirtazapine to 7.5 mg Q HS and continue Sertraline 100 mg daily    6. Vascular  dementia without behavioral disturbance - continue Donepezil 10 mg Q HS, supportive care, and fall precautions    Family/ staff Communication: Discussed plan of care with resident.  Labs/tests ordered:  None  Goals of care:   Long-term care   Kenard Gower, NP [facility] [phone] (Monday-Friday 8:00 a.m. - 5:00 p.m.) [phone] (after hours)

## 2017-03-16 LAB — POCT INR: INR: 2.4 — AB (ref 0.9–1.1)

## 2017-03-16 LAB — PROTIME-INR: Protime: 25.7 — AB (ref 10.0–13.8)

## 2017-03-30 ENCOUNTER — Non-Acute Institutional Stay (SKILLED_NURSING_FACILITY): Payer: Medicare Other | Admitting: Adult Health

## 2017-03-30 ENCOUNTER — Encounter: Payer: Self-pay | Admitting: Adult Health

## 2017-03-30 DIAGNOSIS — Z8673 Personal history of transient ischemic attack (TIA), and cerebral infarction without residual deficits: Secondary | ICD-10-CM | POA: Diagnosis not present

## 2017-03-30 DIAGNOSIS — I10 Essential (primary) hypertension: Secondary | ICD-10-CM

## 2017-03-30 DIAGNOSIS — E1149 Type 2 diabetes mellitus with other diabetic neurological complication: Secondary | ICD-10-CM | POA: Diagnosis not present

## 2017-03-30 DIAGNOSIS — F0150 Vascular dementia without behavioral disturbance: Secondary | ICD-10-CM | POA: Diagnosis not present

## 2017-03-30 DIAGNOSIS — F418 Other specified anxiety disorders: Secondary | ICD-10-CM

## 2017-03-30 DIAGNOSIS — E034 Atrophy of thyroid (acquired): Secondary | ICD-10-CM

## 2017-03-30 DIAGNOSIS — Z7901 Long term (current) use of anticoagulants: Secondary | ICD-10-CM | POA: Diagnosis not present

## 2017-03-30 LAB — PROTIME-INR: Protime: 26 — AB (ref 10.0–13.8)

## 2017-03-30 LAB — POCT INR: INR: 2.5 — AB (ref 0.9–1.1)

## 2017-03-30 NOTE — Progress Notes (Signed)
Location:  [facility] Room Number: 218-A Place of Service:  SNF (31) Provider:  Kenard GowerMedina-Vargas, Jonavin Seder, NP  Patient Care Team: Kirby FunkGriffin, John, MD as PCP - General (Internal Medicine) Jamas LavPittman, Pamela, MD as Consulting Physician (Psychiatry) Trey Sailorsoy, Mark, MD as Consulting Physician (Neurosurgery)  Extended Emergency Contact Information Primary Emergency Contact: Octavio MannsJones,[name] [place] Home Phone: [phone] Relation: Relative Secondary Emergency Contact: Cecille AverJones,[name]  [place] Mobile Phone: [phone] Relation: Relative  Code Status:  Full Code  Goals of care: Advanced Directive information Advanced Directives 11/25/2016  Does Patient Have a Medical Advance Directive? No  Type of Advance Directive -  Does patient want to make changes to medical advance directive? -  Would patient like information on creating a medical advance directive? No - Patient declined  Pre-existing out of facility DNR order (yellow form or pink MOST form) -     Chief Complaint  Patient presents with  . Medical Management of Chronic Issues    Routine [facility] visit    HPI:  Pt is a 67 y.o. female seen today for medical management of chronic diseases.  She is a long-term care resident of [facility].  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 She was seen in her room today. Latest INR 2.5, therapeutic. No bruising was noted. She talked about her [place] who took her furniture and was filing charges.   Past Medical History:  Diagnosis Date  . COPD (chronic obstructive pulmonary disease) (HCC)    on CT scan 06/28/2015  . Dementia   . Diabetes mellitus   . Hypertension   . Hypothyroidism   . Idiopathic acute pancreatitis 09/16/2011  . Lambl's excrescence  on aortic valve   . Memory loss   . Pancreatitis 2010  . Tobacco abuse   . Trigger finger    Past Surgical History:  Procedure Laterality Date  . BACK SURGERY    . CHOLECYSTECTOMY  2003  . CYST REMOVAL HAND Left 12/11/2014   Procedure: LEFT RING FINGER TENDON SHEATH CYST EXCISION;  Surgeon: Mack Hookavid Thompson, MD;  Location: [facility];  Service: Orthopedics;  Laterality: Left;  . SPINE SURGERY  2006   Dr. Trey SailorsMark Roy - L3-L4, L4-L5 laminotomy and foraminotomy  . TEE WITHOUT CARDIOVERSION N/A 07/03/2015   Procedure: TRANSESOPHAGEAL ECHOCARDIOGRAM (TEE);  Surgeon: Quintella Reichertraci R Turner, MD;  [facility];  Service: Cardiovascular;  Laterality: N/A;    No Known Allergies  Outpatient Encounter Medications as of 03/30/2017  Medication Sig  . amLODipine (NORVASC) 10 MG tablet Take 10 mg by mouth daily.  Marland Kitchen. atorvastatin (LIPITOR) 40 MG tablet Take 40 mg by mouth daily.  Marland Kitchen. donepezil (ARICEPT) 10 MG tablet Take 1 tablet (10 mg total) by mouth at bedtime.  Marland Kitchen. levothyroxine (SYNTHROID, LEVOTHROID) 75 MCG tablet Take 75 mcg by mouth daily before breakfast.  . mirtazapine (REMERON) 15 MG tablet Take 15 mg by mouth at bedtime.   . sertraline (ZOLOFT) 100 MG tablet Take 100 mg by mouth daily.  Marland Kitchen. warfarin (COUMADIN) 6 MG tablet Take 6 mg by mouth daily at 6 PM.    No facility-administered encounter medications on file as of 03/30/2017.     Review of Systems  GENERAL: No change in appetite, no fatigue, no weight changes, no fever, chills or weakness MOUTH and THROAT: Denies oral discomfort, gingival pain  or bleeding RESPIRATORY: no cough, SOB, DOE, wheezing, hemoptysis CARDIAC: No chest pain, edema or palpitations GI: No abdominal pain, diarrhea, constipation, heart burn, nausea or vomiting GU: Denies dysuria, frequency, hematuria, incontinence, or discharge PSYCHIATRIC: Denies feelings of depression or anxiety. No report of hallucinations, insomnia, paranoia, or  agitation   Immunization History  Administered Date(s) Administered  . Influenza Split 11/03/2011  . Influenza,inj,Quad PF,6+ Mos 11/17/2012  . Pneumococcal-Unspecified 12/08/2016  . Tdap 12/19/2011   Pertinent  Health Maintenance Due  Topic Date Due  . MAMMOGRAM  06/06/2017 (Originally 02/25/2016)  . DEXA SCAN  06/06/2017 (Originally 01/09/2016)  . FOOT EXAM  07/23/2017 (Originally 02/15/2013)  . OPHTHALMOLOGY EXAM  07/23/2017 (Originally 04/28/2013)  . INFLUENZA VACCINE  10/27/2017 (Originally 08/27/2016)  . COLONOSCOPY  07/05/2025 (Originally 01/08/2001)  . HEMOGLOBIN A1C  07/10/2017  . PNA vac Low Risk Adult (2 of 2 - PCV13) 12/08/2017   Fall Risk  11/25/2016 05/04/2015 09/23/2013  Falls in the past year? No No No      Vitals:   03/30/17 1318  BP: 138/66  Pulse: 64  Resp: 18  Temp: 98.5 F (36.9 C)  TempSrc: Oral  SpO2: 97%  Weight: 219 lb (99.3 kg)  Height: 5\' 5"  (1.651 m)   Body mass index is 36.44 kg/m.  Physical Exam  GENERAL APPEARANCE: Well nourished. In no acute distress. Obese SKIN:  Skin is warm and dry.  MOUTH and THROAT: Lips are without lesions. Oral mucosa is moist and without lesions. Tongue is normal in shape, size, and color and without lesions RESPIRATORY: Breathing is even & unlabored, BS CTAB CARDIAC: RRR, no murmur,no extra heart sounds, no edema GI: Abdomen soft, normal BS, no masses, no tenderness EXTREMITIES:  Able to move X 4 extremities PSYCHIATRIC: Alert to self and place, disoriented to time. Affect and behavior are appropriate   Labs reviewed: Recent Labs    09/15/16 0807 09/18/16 0851 09/25/16 0549 01/09/17  NA 140 141 142 137  K 4.1 3.8 3.6 3.4  CL 112* 109 111  --   CO2 22 22 23   --   GLUCOSE 97 135* 92  --   BUN 15 18 16 21   CREATININE 0.97 1.06* 0.97 0.8  CALCIUM 8.9 9.3 9.0  --    Recent Labs    05/31/16 0427 06/04/16 1043 06/06/16 09/12/16 2010  AST 30 24 24  57*  ALT 24 18 17  32  ALKPHOS 74 78 82 86  BILITOT 0.8  0.4  --  0.5  PROT 7.1 6.6  --  8.3*  ALBUMIN 3.3* 3.2*  --  4.3   Recent Labs    05/30/16 1812  06/06/16 09/12/16 2010  09/14/16 0559 09/15/16 0807 09/24/16 0518  WBC 6.1   < > 6.0 8.0   < > 6.1 5.0 5.4  NEUTROABS 2.8  --  3 6.0  --   --   --   --   HGB 13.6   < > 13.7 13.5   < > 12.5 12.6 12.1  HCT 43.6   < > 44 41.2   < > 39.1 38.2 36.5  MCV 89.2   < >  --  84.8   < > 83.7 82.2 82.8  PLT 132*   < > 160 176   < > 149* 149* 189   < > = values in this interval not displayed.   Lab Results  Component Value Date   TSH 0.62 08/08/2016   Lab Results  Component Value Date  HGBA1C 6.8 01/09/2017   Lab Results  Component Value Date   CHOL 160 05/31/2016   HDL 44 05/31/2016   LDLCALC 101 (H) 05/31/2016   TRIG 76 05/31/2016   CHOLHDL 3.6 05/31/2016    Assessment/Plan  1. Benign essential HTN - well-controlled, continue amlodipine 10 mg 1 tab daily   2. Type II diabetes mellitus with neurological manifestations (HCC) - diet-controlled Lab Results  Component Value Date   HGBA1C 6.8 01/09/2017     3. Hypothyroidism due to acquired atrophy of thyroid - continue levothyroxine 75 g 1 tab daily, check TSH   4. History of CVA (cerebrovascular accident) - stable, continue amlodipine 10 mg 1 tab daily, Coumadin 6 mg daily, atorvastatin 40 mg 1 tab daily   5. Depression with anxiety - continue mirtazapine 97.5 mg 1 tab daily at bedtime and sertraline 100 mg 1 tab daily, check CMP   6. Chronic anticoagulation - INR 2.5, therapeutic, continue Coumadin 6 mg daily, check INR on 04/06/17   7. Vascular dementia without behavioral disturbance - continue donepezil 10 mg 1 tab daily at bedtime, supportive care and fall precautions    Family/ staff Communication: Discussed plan of care with resident.  Labs/tests ordered:  TSH, CBC, CMP  Goals of care:   Long-term care   Kenard Gower, NP [facility] [phone] (Monday-Friday 8:00 a.m. -  5:00 p.m.) [phone] (after hours)

## 2017-04-06 LAB — POCT INR: INR: 2.8 — AB (ref 0.9–1.1)

## 2017-04-06 LAB — CBC AND DIFFERENTIAL
HEMATOCRIT: 42 (ref 36–46)
Hemoglobin: 13.7 (ref 12.0–16.0)
NEUTROS ABS: 3
Platelets: 205 (ref 150–399)
WBC: 5.4

## 2017-04-06 LAB — BASIC METABOLIC PANEL
BUN: 18 (ref 4–21)
Creatinine: 0.9 (ref 0.5–1.1)
Glucose: 129
POTASSIUM: 3.6 (ref 3.4–5.3)
SODIUM: 141 (ref 137–147)

## 2017-04-06 LAB — HEPATIC FUNCTION PANEL
ALK PHOS: 123 (ref 25–125)
ALT: 41 — AB (ref 7–35)
AST: 50 — AB (ref 13–35)
BILIRUBIN, TOTAL: 0.5

## 2017-04-06 LAB — TSH: TSH: 0.01 — AB (ref 0.41–5.90)

## 2017-04-06 LAB — PROTIME-INR: PROTIME: 28.5 — AB (ref 10.0–13.8)

## 2017-04-13 LAB — HEPATIC FUNCTION PANEL
ALT: 20 (ref 7–35)
AST: 28 (ref 13–35)
Alkaline Phosphatase: 120 (ref 25–125)
BILIRUBIN, TOTAL: 0.4

## 2017-04-13 LAB — PROTIME-INR: PROTIME: 26.2 — AB (ref 10.0–13.8)

## 2017-04-13 LAB — POCT INR: INR: 2.5 — AB (ref 0.9–1.1)

## 2017-04-16 ENCOUNTER — Non-Acute Institutional Stay (SKILLED_NURSING_FACILITY): Payer: Medicare Other | Admitting: Internal Medicine

## 2017-04-16 ENCOUNTER — Encounter: Payer: Self-pay | Admitting: Internal Medicine

## 2017-04-16 DIAGNOSIS — F418 Other specified anxiety disorders: Secondary | ICD-10-CM

## 2017-04-16 DIAGNOSIS — E1169 Type 2 diabetes mellitus with other specified complication: Secondary | ICD-10-CM | POA: Diagnosis not present

## 2017-04-16 DIAGNOSIS — E1149 Type 2 diabetes mellitus with other diabetic neurological complication: Secondary | ICD-10-CM | POA: Diagnosis not present

## 2017-04-16 DIAGNOSIS — F01518 Vascular dementia, unspecified severity, with other behavioral disturbance: Secondary | ICD-10-CM

## 2017-04-16 DIAGNOSIS — F0151 Vascular dementia with behavioral disturbance: Secondary | ICD-10-CM | POA: Diagnosis not present

## 2017-04-16 DIAGNOSIS — I10 Essential (primary) hypertension: Secondary | ICD-10-CM | POA: Diagnosis not present

## 2017-04-16 DIAGNOSIS — E785 Hyperlipidemia, unspecified: Secondary | ICD-10-CM

## 2017-04-16 DIAGNOSIS — E034 Atrophy of thyroid (acquired): Secondary | ICD-10-CM | POA: Diagnosis not present

## 2017-04-16 NOTE — Assessment & Plan Note (Signed)
LDL is 45, atorvastatin will be decreased to 20 mg daily with recheck of lipids in 8 weeks

## 2017-04-16 NOTE — Assessment & Plan Note (Signed)
TSH goal would be 1-3; thyroid supplement will be decreased to 50 mcg daily with TSH recheck 8 weeks

## 2017-04-16 NOTE — Assessment & Plan Note (Addendum)
Check A1c; consider Metformin if clinically appropriate

## 2017-04-16 NOTE — Assessment & Plan Note (Addendum)
Aricept is not indicated for vascular dementia Deprescribing trial will be conducted. Specifically the dose will be decreased to 5 mg at bedtime. If she remains stable it could be discontinued when reevaluated in 8 weeks in reference to her thyroid function and dyslipidemia.

## 2017-04-16 NOTE — Assessment & Plan Note (Signed)
Reassessment by psychiatry of present therapy with Remeron and high-dose sertraline will be requested

## 2017-04-16 NOTE — Assessment & Plan Note (Signed)
BP controlled; no change in antihypertensive medications  

## 2017-04-16 NOTE — Patient Instructions (Addendum)
See assessment and plan under each diagnosis in the problem list and acutely for this visit Total time 41 minutes; greater than 50% of the visit spent counseling patient and coordinating care for problems addressed at this encounter  

## 2017-04-16 NOTE — Progress Notes (Signed)
    NURSING HOME LOCATION:  [facility] ROOM NUMBER:  101-A  CODE STATUS:  Full Code  PCP:  Kirby FunkGriffin, John, MD  [address] [place] [address]  This is a nursing facility follow up of dyslipidemia, hypothyroidism,polypharmacy & chronic medical diagnoses  Interim medical record and care since last [facility] visit was updated with review of diagnostic studies and change in clinical status since last visit were documented.  HPI: The patient is a permanent resident of facility. She was admitted in August 2018 following acute kidney injury in the context of poor oral intake and dehydration. Her creatinine had risen as high as 2.69. She has a diagnosis of progressive vascular dementia. CAT scan at that time revealed no acute changes. MRI of the brain 02/22/14 had revealed mild chronic small vessel ischemic changes with old lacunar cerebral infarcts. Neuropsychological testing that time diagnosed mild dementia and behavioral disturbances. She was admitted to the SNF based on social service evaluation of home situation which was felt to be unsafe because of her dementia. 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  She is on Lipitor 40 mg daily. Her LDL is well within goal at 45 and HDL was 50. Liver function test are normal.  TSH is markedly suppressed with a value of 0.01 on 75 g daily. She has a diagnosis of diabetes with neurologic complications. She is on no diabetic medications. Hemoglobin A1c was 6.8% on 01/09/17. It had been stable serially. At the SNF glucoses have ranged from a low 108 up to 190 max. In addition to the dementia she has long standing depression which was exacerbated by the death of her mother from pancreatic cancer in 2009.  Review of systems: Dementia prevented completion. She denies any active symptoms beyond memory loss.  Physical exam:  Pertinent or positive  findings:Upper dental plate present.Slight clubbing suggested. General appearance: Adequately nourished; no acute distress, increased work of breathing is present.   Lymphatic: No lymphadenopathy about the head, neck, axilla. Eyes: No conjunctival inflammation or lid edema is present. There is no scleral icterus. Ears:  External ear exam shows no significant lesions or deformities.   Nose:  External nasal examination shows no deformity or inflammation. Nasal mucosa are pink and moist without lesions, exudates Oral exam:  Lips and gums are healthy appearing. There is no oropharyngeal erythema or exudate. Neck:  No thyromegaly, masses, tenderness noted.    Heart:  Normal rate and regular rhythm. S1 and S2 normal without gallop, murmur, click, rub .  Lungs:Chest clear to auscultation without wheezes, rhonchi,rales , rubs. Abdomen:Bowel sounds are normal. Abdomen is soft and nontender with no organomegaly, hernias,masses. GU: deferred  Extremities:  No cyanosis,edema  Neurologic exam : Strength equal  in upper & lower extremities Deep tendon reflexes are equal Skin: Warm & dry w/o tenting. No significant lesions or rash.  See summary under each active problem in the Problem List with associated updated therapeutic plan

## 2017-04-17 ENCOUNTER — Encounter: Payer: Self-pay | Admitting: Internal Medicine

## 2017-04-17 LAB — HEMOGLOBIN A1C: HEMOGLOBIN A1C: 6.8

## 2017-04-20 LAB — POCT INR: INR: 2.6 — AB (ref 0.9–1.1)

## 2017-04-20 LAB — PROTIME-INR: Protime: 27.3 — AB (ref 10.0–13.8)

## 2017-04-27 ENCOUNTER — Non-Acute Institutional Stay (SKILLED_NURSING_FACILITY): Payer: Medicare Other | Admitting: Adult Health

## 2017-04-27 ENCOUNTER — Encounter: Payer: Self-pay | Admitting: Adult Health

## 2017-04-27 DIAGNOSIS — I10 Essential (primary) hypertension: Secondary | ICD-10-CM

## 2017-04-27 DIAGNOSIS — F329 Major depressive disorder, single episode, unspecified: Secondary | ICD-10-CM

## 2017-04-27 DIAGNOSIS — F32A Depression, unspecified: Secondary | ICD-10-CM

## 2017-04-27 DIAGNOSIS — Z8673 Personal history of transient ischemic attack (TIA), and cerebral infarction without residual deficits: Secondary | ICD-10-CM | POA: Diagnosis not present

## 2017-04-27 DIAGNOSIS — E034 Atrophy of thyroid (acquired): Secondary | ICD-10-CM | POA: Diagnosis not present

## 2017-04-27 DIAGNOSIS — Z7901 Long term (current) use of anticoagulants: Secondary | ICD-10-CM | POA: Diagnosis not present

## 2017-04-27 DIAGNOSIS — F0151 Vascular dementia with behavioral disturbance: Secondary | ICD-10-CM

## 2017-04-27 DIAGNOSIS — F01518 Vascular dementia, unspecified severity, with other behavioral disturbance: Secondary | ICD-10-CM

## 2017-04-27 LAB — POCT INR: INR: 2.7 — AB (ref 0.9–1.1)

## 2017-04-27 LAB — PROTIME-INR: PROTIME: 27.6 — AB (ref 10.0–13.8)

## 2017-04-27 NOTE — Progress Notes (Signed)
Location:  [facility] Room Number: 101-A Place of Service:  SNF (31) Provider:  Kenard Gower, NP  Patient Care Team: Kirby Funk, MD as PCP - General (Internal Medicine) Jamas Lav, MD as Consulting Physician (Psychiatry) Trey Sailors, MD as Consulting Physician (Neurosurgery)  Extended Emergency Contact Information Primary Emergency Contact: Octavio Manns [place] Home Phone: [phone] Relation: Relative Secondary Emergency Contact: Cecille Aver [place] Mobile Phone: [phone] Relation: Relative  Code Status:  Full Code  Goals of care: Advanced Directive information Advanced Directives 11/25/2016  Does Patient Have a Medical Advance Directive? No  Type of Advance Directive -  Does patient want to make changes to medical advance directive? -  Would patient like information on creating a medical advance directive? No - Patient declined  Pre-existing out of facility DNR order (yellow form or pink MOST form) -     Chief Complaint  Patient presents with  . Medical Management of Chronic Issues    Routine [facility] visit    HPI:  Pt is a 67 y.o. female seen today for medical management of chronic diseases. She is a long-term care resident of [facility].  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 She was seen today in her new room. Latest INR  2.7, therapeutic. She takes Coumadin due to history of stroke. No noted bruising nor bleeding. BPs are well-controlled.   Past Medical History:  Diagnosis Date  . COPD (chronic obstructive pulmonary disease) (HCC)    on CT scan 06/28/2015  . Dementia   . Diabetes mellitus   . Hypertension   . Hypothyroidism   . Idiopathic acute pancreatitis 09/16/2011  . Lambl's excrescence on aortic valve    . Memory loss   . Pancreatitis 2010  . Tobacco abuse   . Trigger finger    Past Surgical History:  Procedure Laterality Date  . BACK SURGERY    . CHOLECYSTECTOMY  2003  . CYST REMOVAL HAND Left 12/11/2014   Procedure: LEFT RING FINGER TENDON SHEATH CYST EXCISION;  Surgeon: Mack Hook, MD;  Location: [facility];  Service: Orthopedics;  Laterality: Left;  . SPINE SURGERY  2006   Dr. Trey Sailors - L3-L4, L4-L5 laminotomy and foraminotomy  . TEE WITHOUT CARDIOVERSION N/A 07/03/2015   Procedure: TRANSESOPHAGEAL ECHOCARDIOGRAM (TEE);  Surgeon: Quintella Reichert, MD;  Location: [facility];  Service: Cardiovascular;  Laterality: N/A;    No Known Allergies  Outpatient Encounter Medications as of 04/27/2017  Medication Sig  . amLODipine (NORVASC) 10 MG tablet Take 10 mg by mouth daily.  Marland Kitchen atorvastatin (LIPITOR) 20 MG tablet Take 20 mg by mouth at bedtime.  . donepezil (ARICEPT) 10 MG tablet Take 1 tablet (10 mg total) by mouth at bedtime.  Marland Kitchen levothyroxine (SYNTHROID, LEVOTHROID) 50 MCG tablet Take 50 mcg by mouth daily before breakfast.  . mirtazapine (REMERON) 15 MG tablet Take 15 mg by mouth at bedtime.   . sertraline (ZOLOFT) 100 MG tablet Take 100 mg by mouth daily.  Marland Kitchen warfarin (COUMADIN) 6 MG tablet Take 6 mg by mouth daily at 6 PM.   . [DISCONTINUED] levothyroxine (SYNTHROID, LEVOTHROID) 75 MCG tablet Take 75 mcg by mouth daily before breakfast.  . [DISCONTINUED] atorvastatin (LIPITOR) 40 MG tablet Take 40 mg by mouth daily.   No facility-administered encounter medications on file as of 04/27/2017.  Review of Systems  GENERAL: No change in appetite, no fatigue, no weight changes, no fever, chills or weakness MOUTH and THROAT: Denies oral discomfort, gingival pain or bleeding, pain from teeth or hoarseness   RESPIRATORY: no cough, SOB, DOE, wheezing, hemoptysis CARDIAC: No chest pain, edema or palpitations GI: No abdominal pain, diarrhea, constipation, heart burn,  nausea or vomiting GU: Denies dysuria, frequency, hematuria, incontinence, or discharge PSYCHIATRIC: Denies feelings of depression or anxiety. No report of hallucinations, insomnia, paranoia, or agitation   Immunization History  Administered Date(s) Administered  . Influenza Split 11/03/2011  . Influenza,inj,Quad PF,6+ Mos 11/17/2012  . Pneumococcal-Unspecified 12/08/2016  . Tdap 12/19/2011   Pertinent  Health Maintenance Due  Topic Date Due  . MAMMOGRAM  06/06/2017 (Originally 02/25/2016)  . DEXA SCAN  06/06/2017 (Originally 01/09/2016)  . FOOT EXAM  07/23/2017 (Originally 02/15/2013)  . OPHTHALMOLOGY EXAM  07/23/2017 (Originally 04/28/2013)  . INFLUENZA VACCINE  10/27/2017 (Originally 08/27/2017)  . COLONOSCOPY  07/05/2025 (Originally 01/08/2001)  . HEMOGLOBIN A1C  10/18/2017  . PNA vac Low Risk Adult (2 of 2 - PCV13) 12/08/2017   Fall Risk  11/25/2016 05/04/2015 09/23/2013  Falls in the past year? No No No      Vitals:   04/27/17 1149  BP: 110/72  Pulse: 68  Resp: 18  Temp: 97.6 F (36.4 C)  TempSrc: Oral  SpO2: 94%  Weight: 226 lb 12.8 oz (102.9 kg)  Height: 5\' 5"  (1.651 m)   Body mass index is 37.74 kg/m.  Physical Exam  GENERAL APPEARANCE: Well nourished. In no acute distress. Obese SKIN:  Skin is warm and dry.  MOUTH and THROAT: Lips are without lesions. Oral mucosa is moist and without lesions.  RESPIRATORY: Breathing is even & unlabored, BS CTAB CARDIAC: RRR, no murmur,no extra heart sounds, no edema GI: Abdomen soft, normal BS, no masses, no tenderness EXTREMITIES:  Able to move X 4 extremities PSYCHIATRIC: Alert to self and place, disoriented to time. Affect and behavior are appropriate   Labs reviewed: Recent Labs    09/15/16 0807 09/18/16 0851 09/25/16 0549 01/09/17 04/06/17  NA 140 141 142 137 141  K 4.1 3.8 3.6 3.4 3.6  CL 112* 109 111  --   --   CO2 22 22 23   --   --   GLUCOSE 97 135* 92  --   --   BUN 15 18 16 21 18   CREATININE 0.97 1.06*  0.97 0.8 0.9  CALCIUM 8.9 9.3 9.0  --   --    Recent Labs    05/31/16 0427 06/04/16 1043  09/12/16 2010 04/06/17 04/13/17  AST 30 24   < > 57* 50* 28  ALT 24 18   < > 32 41* 20  ALKPHOS 74 78   < > 86 123 120  BILITOT 0.8 0.4  --  0.5  --   --   PROT 7.1 6.6  --  8.3*  --   --   ALBUMIN 3.3* 3.2*  --  4.3  --   --    < > = values in this interval not displayed.   Recent Labs    06/06/16 09/12/16 2010  09/14/16 0559 09/15/16 0807 09/24/16 0518 04/06/17  WBC 6.0 8.0   < > 6.1 5.0 5.4 5.4  NEUTROABS 3 6.0  --   --   --   --  3  HGB 13.7 13.5   < > 12.5 12.6 12.1 13.7  HCT 44 41.2   < >  39.1 38.2 36.5 42  MCV  --  84.8   < > 83.7 82.2 82.8  --   PLT 160 176   < > 149* 149* 189 205   < > = values in this interval not displayed.   Lab Results  Component Value Date   TSH 0.01 (A) 04/06/2017   Lab Results  Component Value Date   HGBA1C 6.8 04/17/2017   Lab Results  Component Value Date   CHOL 160 05/31/2016   HDL 44 05/31/2016   LDLCALC 101 (H) 05/31/2016   TRIG 76 05/31/2016   CHOLHDL 3.6 05/31/2016       Assessment/Plan  1. Long term current use of anticoagulant - latest INR  2.7, therapeutic, continue Coumadin 6 mg daily, re-check INR 05/01/17   2. History of cardioembolic cerebrovascular accident (CVA) - stable, continue  Coumadin 6 mg daily, Amlodipine 10 mg daily   3. Benign essential HTN - well-controlled, continue Amlodipine 10 mg daily   4. Hypothyroidism due to acquired atrophy of thyroid - recently decreased Levothyroxine to 50 mcg daily and for repeat tsh on  5/20 Lab Results  Component Value Date   TSH 0.01 (A) 04/06/2017     5. Vascular dementia with behavioral disturbance - continue supportive care, fall precautions, continue Donepezil 5 mg Q HS   6. Chronic depression - mood is stable, continue Sertraline 100 mg Q d, discontinue Mirtazepine 7.5 mg daily, psych consult with [facility]    Family/ staff Communication: Discussed plan of  care with resident.  Labs/tests ordered:  INR on 05/01/17  Goals of care:   Long-term care.   Kenard Gower, NP [facility] [phone] (Monday-Friday 8:00 a.m. - 5:00 p.m.) [phone] (after hours)

## 2017-05-07 LAB — POCT INR: INR: 2.3 — AB (ref 0.9–1.1)

## 2017-05-07 LAB — PROTIME-INR: Protime: 24.8 — AB (ref 10.0–13.8)

## 2017-05-14 NOTE — Progress Notes (Signed)
4/11

## 2017-05-21 LAB — PROTIME-INR: PROTIME: 24.4 — AB (ref 10.0–13.8)

## 2017-05-21 LAB — POCT INR: INR: 2.3 — AB (ref 0.9–1.1)

## 2017-05-27 LAB — PROTIME-INR: PROTIME: 20.3 — AB (ref 10.0–13.8)

## 2017-05-27 LAB — POCT INR: INR: 1.8 — AB (ref 0.9–1.1)

## 2017-05-28 ENCOUNTER — Non-Acute Institutional Stay (SKILLED_NURSING_FACILITY): Payer: Medicare Other | Admitting: Adult Health

## 2017-05-28 ENCOUNTER — Encounter: Payer: Self-pay | Admitting: Adult Health

## 2017-05-28 DIAGNOSIS — I10 Essential (primary) hypertension: Secondary | ICD-10-CM

## 2017-05-28 DIAGNOSIS — E034 Atrophy of thyroid (acquired): Secondary | ICD-10-CM

## 2017-05-28 DIAGNOSIS — E785 Hyperlipidemia, unspecified: Secondary | ICD-10-CM

## 2017-05-28 DIAGNOSIS — F01518 Vascular dementia, unspecified severity, with other behavioral disturbance: Secondary | ICD-10-CM

## 2017-05-28 DIAGNOSIS — E1169 Type 2 diabetes mellitus with other specified complication: Secondary | ICD-10-CM | POA: Diagnosis not present

## 2017-05-28 DIAGNOSIS — Z8673 Personal history of transient ischemic attack (TIA), and cerebral infarction without residual deficits: Secondary | ICD-10-CM

## 2017-05-28 DIAGNOSIS — F418 Other specified anxiety disorders: Secondary | ICD-10-CM | POA: Diagnosis not present

## 2017-05-28 DIAGNOSIS — F0151 Vascular dementia with behavioral disturbance: Secondary | ICD-10-CM | POA: Diagnosis not present

## 2017-05-28 NOTE — Progress Notes (Signed)
Location:  [facility] Room Number: 101-A Place of Service:  SNF (31) Provider:  Kenard Gower, NP  Patient Care Team: Kirby Funk, MD as PCP - General (Internal Medicine) Jamas Lav, MD as Consulting Physician (Psychiatry) Trey Sailors, MD as Consulting Physician (Neurosurgery)  Extended Emergency Contact Information Primary Emergency Contact: Octavio Manns [place] Home Phone: [phone] Relation: Relative Secondary Emergency Contact: Cecille Aver [place] Mobile Phone: [phone] Relation: Relative  Code Status:  Full Code  Goals of care: Advanced Directive information Advanced Directives 11/25/2016  Does Patient Have a Medical Advance Directive? No  Type of Advance Directive -  Does patient want to make changes to medical advance directive? -  Would patient like information on creating a medical advance directive? No - Patient declined  Pre-existing out of facility DNR order (yellow form or pink MOST form) -     Chief Complaint  Patient presents with  . Medical Management of Chronic Issues    Routine [facility] visit    HPI:  Pt is a 67 y.o. female seen today for medical management of chronic diseases.  She is a long-term care resident of [facility].  She has a PMH of Lambl's excrescences of he aortic valve complicated by an occipital stroke, hypothyroidism, vascular dementia, COPD, essential HTN, depression/anxiety, and dyslipidemia. She was seen in the room today. Latest INR 1.8, Coumadin was adjusted yesterday. She takes Coumadin for history of CVA. Her BPs are well-controlled - 130/88, 132/76.   Past Medical History:  Diagnosis Date  . COPD (chronic obstructive pulmonary disease) (HCC)    on CT scan 06/28/2015  . Dementia   . Diabetes mellitus   . Hypertension   . Hypothyroidism   . Idiopathic acute pancreatitis 09/16/2011  . Lambl's excrescence on aortic valve     . Memory loss   . Pancreatitis 2010  . Tobacco abuse   . Trigger finger    Past Surgical History:  Procedure Laterality Date  . BACK SURGERY    . CHOLECYSTECTOMY  2003  . CYST REMOVAL HAND Left 12/11/2014   Procedure: LEFT RING FINGER TENDON SHEATH CYST EXCISION;  Surgeon: Mack Hook, MD;  Location: [facility];  Service: Orthopedics;  Laterality: Left;  . SPINE SURGERY  2006   Dr. Trey Sailors - L3-L4, L4-L5 laminotomy and foraminotomy  . TEE WITHOUT CARDIOVERSION N/A 07/03/2015   Procedure: TRANSESOPHAGEAL ECHOCARDIOGRAM (TEE);  Surgeon: Quintella Reichert, MD;  [facility];  Service: Cardiovascular;  Laterality: N/A;    No Known Allergies  Outpatient Encounter Medications as of 05/28/2017  Medication Sig  . amLODipine (NORVASC) 10 MG tablet Take 10 mg by mouth daily.  Marland Kitchen atorvastatin (LIPITOR) 20 MG tablet Take 20 mg by mouth at bedtime.  . donepezil (ARICEPT) 10 MG tablet Take 1 tablet (10 mg total) by mouth at bedtime.  Marland Kitchen levothyroxine (SYNTHROID, LEVOTHROID) 50 MCG tablet Take 50 mcg by mouth daily before breakfast.  . sertraline (ZOLOFT) 100 MG tablet Take 150 mg by mouth daily. Take 1-1/2 tablets to = 150 mg  . warfarin (COUMADIN) 5 MG tablet Take 5 mg by mouth See admin instructions. Take Tue-Thur-Sat-Sun  . warfarin (COUMADIN) 6 MG tablet Take 6 mg by mouth See admin instructions. Take M-W-F   No facility-administered encounter medications on file as of 05/28/2017.     Review of Systems  GENERAL: No change in appetite, no fatigue, no weight changes, no fever, chills or weakness  MOUTH and THROAT: Denies oral discomfort, gingival pain or bleeding, pain from teeth or hoarseness   RESPIRATORY: no cough, SOB, DOE, wheezing, hemoptysis CARDIAC: No chest pain, edema or palpitations GI: No abdominal pain, diarrhea, constipation, heart burn, nausea or vomiting GU: Denies dysuria, frequency, hematuria, incontinence, or discharge PSYCHIATRIC: Denies feelings of  depression or anxiety. No report of hallucinations, insomnia, paranoia, or agitation    Immunization History  Administered Date(s) Administered  . Influenza Split 11/03/2011  . Influenza,inj,Quad PF,6+ Mos 11/17/2012  . Pneumococcal-Unspecified 12/08/2016  . Tdap 12/19/2011   Pertinent  Health Maintenance Due  Topic Date Due  . MAMMOGRAM  06/06/2017 (Originally 02/25/2016)  . DEXA SCAN  06/06/2017 (Originally 01/09/2016)  . FOOT EXAM  07/23/2017 (Originally 02/15/2013)  . OPHTHALMOLOGY EXAM  07/23/2017 (Originally 04/28/2013)  . INFLUENZA VACCINE  10/27/2017 (Originally 08/27/2017)  . COLONOSCOPY  07/05/2025 (Originally 01/08/2001)  . HEMOGLOBIN A1C  10/18/2017  . PNA vac Low Risk Adult (2 of 2 - PCV13) 12/08/2017   Fall Risk  11/25/2016 05/04/2015 09/23/2013  Falls in the past year? No No No      Vitals:   05/28/17 1134  BP: 130/88  Pulse: (!) 58  Resp: 18  Temp: 98.3 F (36.8 C)  TempSrc: Oral  SpO2: 93%  Weight: 217 lb 9.6 oz (98.7 kg)  Height:  (1.651 m)   Body mass index is 36.21 kg/m.  Physical Exam  GENERAL APPEARANCE: Well nourished. In no acute distress. Obese SKIN:  Skin is warm and dry.  MOUTH and THROAT: Lips are without lesions. Oral mucosa is moist and without lesions. Tongue is normal in shape, size, and color and without lesions RESPIRATORY: Breathing is even & unlabored, BS CTAB CARDIAC: RRR, no murmur,no extra heart sounds, no edema GI: Abdomen soft, normal BS, no masses, no tenderness, no hepatomegaly, no splenomegaly EXTREMITIES:  Able to move X 4 extremities PSYCHIATRIC: Alert to self, disoriented to time and place. Affect and behavior are appropriate   Labs reviewed: Recent Labs    09/15/16 0807 09/18/16 0851 09/25/16 0549 01/09/17 04/06/17  NA 140 141 142 137 141  K 4.1 3.8 3.6 3.4 3.6  CL 112* 109 111  --   --   CO2 --   --   GLUCOSE 97 135* 92  --   --   BUN CREATININE 0.97 1.06* 0.97 0.8 0.9  CALCIUM  8.9 9.3 9.0  --   --    Recent Labs    05/31/16 0427 06/04/16 1043  09/12/16 2010 04/06/17 04/13/17  AST 30 24   < > 57* 50* 28  ALT 24 18   < > 32 41* 20  ALKPHOS 74 78   < > 86 123 120  BILITOT 0.8 0.4  --  0.5  --   --   PROT 7.1 6.6  --  8.3*  --   --   ALBUMIN 3.3* 3.2*  --  4.3  --   --    < > = values in this interval not displayed.   Recent Labs    06/06/16 09/12/16 2010  09/14/16 0559 09/15/16 0807 09/24/16 0518 04/06/17  WBC 6.0 8.0   < > 6.1 5.0 5.4 5.4  NEUTROABS 3 6.0  --   --   --   --  3  HGB 13.7 13.5   < > 12.5 12.6 12.1 13.7  HCT 44 41.2   < > 39.1 38.2  36.5 42  MCV  --  84.8   < > 83.7 82.2 82.8  --   PLT 160 176   < > 149* 149* 189 205   < > = values in this interval not displayed.   Lab Results  Component Value Date   TSH 0.01 (A) 04/06/2017   Lab Results  Component Value Date   HGBA1C 6.8 04/17/2017   Lab Results  Component Value Date   CHOL 160 05/31/2016   HDL 44 05/31/2016   LDLCALC 101 (H) 05/31/2016   TRIG 76 05/31/2016   CHOLHDL 3.6 05/31/2016     Assessment/Plan  1. Hypothyroidism due to acquired atrophy of thyroid - continue Levothyroxine 50 mcg 1 tab daily Lab Results  Component Value Date   TSH 0.01 (A) 04/06/2017     2. Dyslipidemia associated with type 2 diabetes mellitus (HCC) - continue Atorvastatin 20 mg daily Lab Results  Component Value Date   CHOL 160 05/31/2016   HDL 44 05/31/2016   LDLCALC 101 (H) 05/31/2016   TRIG 76 05/31/2016   CHOLHDL 3.6 05/31/2016      3. History of cardioembolic cerebrovascular accident (CVA) - stable, continue Coumadin, Amlodipine and Atorvastatin    4. Depression with anxiety - mood is stable, continue Sertraline 150 mg daily, Remeron was recently discontinued, followed-up by [facility]   5. Benign essential HTN - well-controlled, continue Amlodipine 10 mg daily   6. Vascular dementia with behavioral disturbance - continue Donepezil 5 mg Q HS, supportive care and fall  precautions     Family/ staff Communication:  Discussed plan of care with resident.  Labs/tests ordered:  None  Goals of care:   Long-term care   Kenard Gower, NP [facility] [phone] (Monday-Friday 8:00 a.m. - 5:00 p.m.) [phone] (after hours)

## 2017-06-04 LAB — PROTIME-INR: PROTIME: 23.4 — AB (ref 10.0–13.8)

## 2017-06-04 LAB — POCT INR: INR: 2.1 — AB (ref 0.9–1.1)

## 2017-06-11 LAB — PROTIME-INR: Protime: 25.5 — AB (ref 10.0–13.8)

## 2017-06-11 LAB — POCT INR: INR: 2.4 — AB (ref 0.9–1.1)

## 2017-06-16 LAB — LIPID PANEL
Cholesterol: 120 (ref 0–200)
HDL: 53 (ref 35–70)
LDL CALC: 56
LDl/HDL Ratio: 2.3
TRIGLYCERIDES: 58 (ref 40–160)

## 2017-06-16 LAB — TSH: TSH: 1.49 (ref 0.41–5.90)

## 2017-06-17 LAB — LIPID PANEL
Cholesterol: 136 (ref 0–200)
HDL: 56 (ref 35–70)
LDL Cholesterol: 65
LDL/HDL RATIO: 2.4
TRIGLYCERIDES: 76 (ref 40–160)

## 2017-06-17 LAB — TSH: TSH: 2.18 (ref 0.41–5.90)

## 2017-06-30 LAB — POCT INR: INR: 2.4 — AB (ref 0.9–1.1)

## 2017-07-02 ENCOUNTER — Non-Acute Institutional Stay (SKILLED_NURSING_FACILITY): Payer: Medicare Other | Admitting: Internal Medicine

## 2017-07-02 ENCOUNTER — Encounter: Payer: Self-pay | Admitting: Internal Medicine

## 2017-07-02 DIAGNOSIS — F0280 Dementia in other diseases classified elsewhere without behavioral disturbance: Secondary | ICD-10-CM

## 2017-07-02 DIAGNOSIS — E1149 Type 2 diabetes mellitus with other diabetic neurological complication: Secondary | ICD-10-CM | POA: Diagnosis not present

## 2017-07-02 DIAGNOSIS — F339 Major depressive disorder, recurrent, unspecified: Secondary | ICD-10-CM | POA: Diagnosis not present

## 2017-07-02 DIAGNOSIS — I358 Other nonrheumatic aortic valve disorders: Secondary | ICD-10-CM

## 2017-07-02 DIAGNOSIS — G300 Alzheimer's disease with early onset: Secondary | ICD-10-CM | POA: Diagnosis not present

## 2017-07-02 NOTE — Progress Notes (Signed)
NURSING HOME LOCATION:  [facility] ROOM NUMBER:  101-A  CODE STATUS:  Full Code  PCP:  Pecola LawlessHopper, [name] F, MD  [address] [place] [id number]  This is a nursing facility follow up of chronic medical diagnoses   Interim medical record and care since last [facility] visit was updated with review of diagnostic studies and change in clinical status since last visit were documented.  HPI: She is a permanent resident of the facility with medical diagnoses of essential hypertension, diabetes, hypothyroidism, COPD, and dementia. She does have a history of valvular heart disease.  CT scan 09/12/16 revealed left occipital/splenium infarct, chronic lacunar infarcts in cerebellum, and mild parenchymal volume loss. Labs are current. Lipids are at goal ; TSH is therapeutic. INR is also therapeutic and has been stable within a narrow range. Overall glucose control has been good with most values between 133-202. There was one outlier of 283 fasting. On 04/17/17 A1c was 6.8% indicating good control.  Review of systems: She cannot provide the date, even the year. She did know the name of the president. She denies any active symptoms except for a little anxiety/depression related to belief that a friend who is her financial power of attorney has been withdrawing funds from her accounts for personal use. She stated the police notified her of this via her cell phone; yet the patient exhibited inability to retrieve messages from the cell phone while I was at the bedside.  Rolla EtienneMelissa Jacobs, LCSW saw the patient on 06/23/17 for psychotherapy. She diagnosed major depressive disorder with psychotic features.  Constitutional: No fever, significant weight change, fatigue  Eyes: No redness, discharge, pain, vision change ENT/mouth: No nasal congestion,  purulent discharge, earache, change in hearing, sore throat  Cardiovascular: No chest pain, palpitations, paroxysmal nocturnal dyspnea,  claudication, edema  Respiratory: No cough, sputum production, hemoptysis, DOE, significant snoring, apnea   Gastrointestinal: No heartburn, dysphagia, abdominal pain, nausea /vomiting, rectal bleeding, melena, change in bowels Genitourinary: No dysuria, hematuria, pyuria, incontinence, nocturia Musculoskeletal: No joint stiffness, joint swelling, weakness, pain Dermatologic: No rash, pruritus, change in appearance of skin Neurologic: No dizziness, headache, syncope, seizures, numbness, tingling Endocrine: No change in hair/skin/nails, excessive thirst, excessive hunger, excessive urination  Hematologic/lymphatic: No significant bruising, lymphadenopathy, abnormal bleeding Allergy/immunology: No itchy/watery eyes, significant sneezing, urticaria, angioedema  Physical exam:  Pertinent or positive findings:Her cognitive deficit is not blatant but rather subtle. Upper plate present.Heart sounds distant & slow. General appearance: Adequately nourished; no acute distress, increased work of breathing is present.   Lymphatic: No lymphadenopathy about the head, neck, axilla. Eyes: No conjunctival inflammation or lid edema is present. There is no scleral icterus. Ears:  External ear exam shows no significant lesions or deformities.   Nose:  External nasal examination shows no deformity or inflammation. Nasal mucosa are pink and moist without lesions, exudates Oral exam:  Lips and gums are healthy appearing. There is no oropharyngeal erythema or exudate. Neck:  No thyromegaly, masses, tenderness noted.    Heart:  No gallop, murmur, click, rub .  Lungs: Chest clear to auscultation without wheezes, rhonchi, rales, rubs. Abdomen: Bowel sounds are normal. Abdomen is soft and nontender with no organomegaly, hernias, masses. GU: Deferred  Extremities:  No cyanosis, clubbing, edema  Neurologic exam : Strength equal  in upper & lower extremities Deep tendon reflexes are equal Skin: Warm & dry w/o  tenting. No significant lesions or rash.  See summary under each active problem in the Problem  List with associated updated therapeutic plan

## 2017-07-02 NOTE — Assessment & Plan Note (Signed)
PT/INR has been therapeutic

## 2017-07-02 NOTE — Assessment & Plan Note (Addendum)
Reassessment by [facility] due to atypical presentation, ie is this truly Alzheimer's or other neurocognitive disorder

## 2017-07-02 NOTE — Patient Instructions (Addendum)
See assessment and plan under each diagnosis in the problem list and acutely for this visit Psych & Neurology consults to assess present neurocognitive issues

## 2017-07-02 NOTE — Assessment & Plan Note (Addendum)
Continue Psych follow-up and evaluate need for guardianship

## 2017-07-02 NOTE — Assessment & Plan Note (Signed)
04/17/17 A1c was 6.8% indicating good control

## 2017-07-29 ENCOUNTER — Non-Acute Institutional Stay (SKILLED_NURSING_FACILITY): Payer: Medicare Other | Admitting: Adult Health

## 2017-07-29 ENCOUNTER — Encounter: Payer: Self-pay | Admitting: Adult Health

## 2017-07-29 DIAGNOSIS — Z7901 Long term (current) use of anticoagulants: Secondary | ICD-10-CM

## 2017-07-29 DIAGNOSIS — E034 Atrophy of thyroid (acquired): Secondary | ICD-10-CM

## 2017-07-29 DIAGNOSIS — F0151 Vascular dementia with behavioral disturbance: Secondary | ICD-10-CM

## 2017-07-29 DIAGNOSIS — I10 Essential (primary) hypertension: Secondary | ICD-10-CM | POA: Diagnosis not present

## 2017-07-29 DIAGNOSIS — F01518 Vascular dementia, unspecified severity, with other behavioral disturbance: Secondary | ICD-10-CM

## 2017-07-29 DIAGNOSIS — F418 Other specified anxiety disorders: Secondary | ICD-10-CM

## 2017-07-29 DIAGNOSIS — Z8673 Personal history of transient ischemic attack (TIA), and cerebral infarction without residual deficits: Secondary | ICD-10-CM | POA: Diagnosis not present

## 2017-07-29 NOTE — Progress Notes (Addendum)
Location:  [facility] Room Number: 101-A Place of Service:  SNF (31) Provider:  Kenard GowerMedina-Vargas, Monina, NP  Patient Care Team: Pecola LawlessHopper, [name] F, MD as PCP - General (Internal Medicine) Jamas LavPittman, Pamela, MD as Consulting Physician (Psychiatry) Trey Sailorsoy, Mark, MD as Consulting Physician (Neurosurgery) Medina-Vargas, Margit BandaMonina C, NP as Nurse Practitioner (Internal Medicine)  Extended Emergency Contact Information Primary Emergency Contact: Octavio MannsJones,[name] [place] Home Phone: [phone] Relation: Relative Secondary Emergency Contact: Cecille AverJones,[name]  [place] Mobile Phone: [phone] Relation: Relative  Code Status:  Full Code  Goals of care: Advanced Directive information Advanced Directives 11/25/2016  Does Patient Have a Medical Advance Directive? No  Type of Advance Directive -  Does patient want to make changes to medical advance directive? -  Would patient like information on creating a medical advance directive? No - Patient declined  Pre-existing out of facility DNR order (yellow form or pink MOST form) -     Chief Complaint  Patient presents with  . Medical Management of Chronic Issues    The patient is seen for a routine [facility] visit    HPI:  Pt is a 67 y.o. female seen today for medical management of chronic diseases.  She is a [facility] care [facility].  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 She was seen today in her room. Latest INR 2.6, therapeutic. No bleeding noted.   Past Medical History:  Diagnosis Date  . COPD (chronic obstructive pulmonary disease) (HCC)    on CT scan 06/28/2015  . Dementia   . Diabetes mellitus   . Hypertension   . Hypothyroidism   . Idiopathic acute pancreatitis 09/16/2011  . Lambl's  excrescence on aortic valve   . Memory loss   . Pancreatitis 2010  . Tobacco abuse   . Trigger finger    Past Surgical History:  Procedure Laterality Date  . BACK SURGERY    . CHOLECYSTECTOMY  2003  . CYST REMOVAL HAND Left 12/11/2014   Procedure: LEFT RING FINGER TENDON SHEATH CYST EXCISION;  Surgeon: Mack Hookavid Thompson, MD;  Location: [facility];  Service: Orthopedics;  Laterality: Left;  . SPINE SURGERY  2006   Dr. Trey SailorsMark Roy - L3-L4, L4-L5 laminotomy and foraminotomy  . TEE WITHOUT CARDIOVERSION N/A 07/03/2015   Procedure: TRANSESOPHAGEAL ECHOCARDIOGRAM (TEE);  Surgeon: Quintella Reichertraci R Turner, MD;  [facility];  Service: Cardiovascular;  Laterality: N/A;    No Known Allergies  Outpatient Encounter Medications as of 07/29/2017  Medication Sig  . amLODipine (NORVASC) 10 MG tablet Take 10 mg by mouth daily.  Marland Kitchen. atorvastatin (LIPITOR) 20 MG tablet Take 20 mg by mouth at bedtime.   . donepezil (ARICEPT) 5 MG tablet Take 5 mg by mouth at bedtime.  Marland Kitchen. levothyroxine (SYNTHROID, LEVOTHROID) 50 MCG tablet Take 50 mcg by mouth daily before breakfast.  . sertraline (ZOLOFT) 100 MG tablet Take 150 mg by mouth daily. Take 1-1/2 tablets to = 150 mg  . warfarin (COUMADIN) 6 MG tablet Take 6 mg by mouth daily.   . [DISCONTINUED] donepezil (ARICEPT) 10 MG tablet Take 1 tablet (10 mg total) by mouth at bedtime.   No facility-administered encounter medications on file as of 07/29/2017.     Review of Systems  GENERAL: No change in appetite, no fatigue, no weight changes, no fever, chills or weakness MOUTH and THROAT:  Denies oral discomfort, gingival pain or bleeding RESPIRATORY: no cough, SOB, DOE, wheezing, hemoptysis CARDIAC: No chest pain, edema or palpitations GI: No abdominal pain, diarrhea, constipation, heart burn, nausea or vomiting GU: Denies dysuria, frequency, hematuria, incontinence, or discharge PSYCHIATRIC: Denies feelings of depression or anxiety. No report of  hallucinations, insomnia, paranoia, or agitation   Immunization History  Administered Date(s) Administered  . Influenza Split 11/03/2011  . Influenza,inj,Quad PF,6+ Mos 11/17/2012  . Pneumococcal-Unspecified 12/08/2016  . Tdap 12/19/2011   Pertinent  Health Maintenance Due  Topic Date Due  . FOOT EXAM  02/15/2013  . OPHTHALMOLOGY EXAM  04/28/2013  . DEXA SCAN  01/09/2016  . MAMMOGRAM  02/25/2016  . INFLUENZA VACCINE  10/27/2017 (Originally 08/27/2017)  . COLONOSCOPY  07/05/2025 (Originally 01/08/2001)  . HEMOGLOBIN A1C  10/18/2017  . PNA vac Low Risk Adult (2 of 2 - PCV13) 12/08/2017   Fall Risk  11/25/2016 05/04/2015 09/23/2013  Falls in the past year? No No No      Vitals:   07/29/17 0851  BP: 114/69  Pulse: 60  Resp: 19  Temp: 97.7 F (36.5 C)  TempSrc: Oral  SpO2: 95%  Weight: 217 lb (98.4 kg)  Height: 5\' 5"  (1.651 m)   Body mass index is 36.11 kg/m.  Physical Exam  GENERAL APPEARANCE: Well nourished. In no acute distress.Obese SKIN:  Skin is warm and dry.  MOUTH and THROAT: Lips are without lesions. Oral mucosa is moist and without lesions. Tongue is normal in shape, size, and color and without lesions RESPIRATORY: Breathing is even & unlabored, BS CTAB CARDIAC: RRR, no murmur,no extra heart sounds, no edema GI: Abdomen soft, normal BS, no masses, no tenderness EXTREMITIES:  Able to move X 4 extremities PSYCHIATRIC: Alert and oriented X 3. Affect and behavior are appropriate  Labs reviewed: Recent Labs    09/15/16 0807 09/18/16 0851 09/25/16 0549 01/09/17 04/06/17  NA 140 141 142 137 141  K 4.1 3.8 3.6 3.4 3.6  CL 112* 109 111  --   --   CO2 22 22 23   --   --   GLUCOSE 97 135* 92  --   --   BUN 15 18 16 21 18   CREATININE 0.97 1.06* 0.97 0.8 0.9  CALCIUM 8.9 9.3 9.0  --   --    Recent Labs    09/12/16 2010 04/06/17 04/13/17  AST 57* 50* 28  ALT 32 41* 20  ALKPHOS 86 123 120  BILITOT 0.5  --   --   PROT 8.3*  --   --   ALBUMIN 4.3  --   --     Recent Labs    09/12/16 2010  09/14/16 0559 09/15/16 0807 09/24/16 0518 04/06/17  WBC 8.0   < > 6.1 5.0 5.4 5.4  NEUTROABS 6.0  --   --   --   --  3  HGB 13.5   < > 12.5 12.6 12.1 13.7  HCT 41.2   < > 39.1 38.2 36.5 42  MCV 84.8   < > 83.7 82.2 82.8  --   PLT 176   < > 149* 149* 189 205   < > = values in this interval not displayed.   Lab Results  Component Value Date   TSH 2.18 06/17/2017   Lab Results  Component Value Date   HGBA1C 6.8 04/17/2017   Lab Results  Component Value Date   CHOL 136 06/17/2017   HDL 56 06/17/2017   LDLCALC 65  06/17/2017   TRIG 76 06/17/2017   CHOLHDL 3.6 05/31/2016     Assessment/Plan  1. Chronic anticoagulation - INR  2.6, therapeutic, will continue Coumadin 6 mg daily, check INR on  08/05/17  2. Benign essential hypertension - stable, well-controlled with BPs 120/74, 138/76,  continue Amlodipine 10 mg daily   3. History of cardioembolic cerebrovascular accident (CVA) - stable, continue Coumadin 6 mg daily, Atorvastatin 20 mg Q HS, Amlodipine 10 mg Q D, refer to ophthalmology and podiatry for routine check   4. Depression with anxiety - mood is stable, followed up by psychotherapist and psych NP, continue Sertraline 150 mg daily   5. Hypothyroidism due to acquired atrophy of thyroid - continue Levothyroxine 50 mcg 1 tab daily Lab Results  Component Value Date   TSH 2.18 06/17/2017     6. Vascular dementia with behavioral disturbance -  Stable, continue Donepezil 5 mg 1 tab Q HS     Family/ staff Communication: Discussed plan of care with resident.  Labs/tests ordered:  Routine mammogram, hepatitis C antibody, Dexa scan  Goals of care:   Long-term care.   Kenard Gower, NP [facility] [phone] (Monday-Friday 8:00 a.m. - 5:00 p.m.) [phone] (after hours)

## 2017-08-24 LAB — POCT INR: INR: 2.5 — AB (ref 0.9–1.1)

## 2017-08-24 LAB — PROTIME-INR: Protime: 26.4 — AB (ref 10.0–13.8)

## 2017-08-31 ENCOUNTER — Encounter: Payer: Self-pay | Admitting: Adult Health

## 2017-08-31 ENCOUNTER — Non-Acute Institutional Stay (SKILLED_NURSING_FACILITY): Payer: Medicare Other | Admitting: Adult Health

## 2017-08-31 DIAGNOSIS — E034 Atrophy of thyroid (acquired): Secondary | ICD-10-CM | POA: Diagnosis not present

## 2017-08-31 DIAGNOSIS — F321 Major depressive disorder, single episode, moderate: Secondary | ICD-10-CM

## 2017-08-31 DIAGNOSIS — Z8673 Personal history of transient ischemic attack (TIA), and cerebral infarction without residual deficits: Secondary | ICD-10-CM

## 2017-08-31 DIAGNOSIS — E1169 Type 2 diabetes mellitus with other specified complication: Secondary | ICD-10-CM | POA: Diagnosis not present

## 2017-08-31 DIAGNOSIS — F0151 Vascular dementia with behavioral disturbance: Secondary | ICD-10-CM

## 2017-08-31 DIAGNOSIS — I10 Essential (primary) hypertension: Secondary | ICD-10-CM

## 2017-08-31 DIAGNOSIS — F01518 Vascular dementia, unspecified severity, with other behavioral disturbance: Secondary | ICD-10-CM

## 2017-08-31 DIAGNOSIS — E785 Hyperlipidemia, unspecified: Secondary | ICD-10-CM
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
## 2017-09-02 LAB — HM HEPATITIS C SCREENING LAB: HM HEPATITIS C SCREENING: NEGATIVE

## 2017-10-05 LAB — PROTIME-INR
Protime: 24 — AB (ref 10.0–13.8)
Protime: 24 — AB (ref 10.0–13.8)

## 2017-10-05 LAB — POCT INR
INR: 2.2 — AB (ref 0.9–1.1)
INR: 2.2 — AB (ref 0.9–1.1)

## 2017-10-13 ENCOUNTER — Non-Acute Institutional Stay (SKILLED_NURSING_FACILITY): Payer: Medicare Other | Admitting: Internal Medicine

## 2017-10-13 ENCOUNTER — Encounter: Payer: Self-pay | Admitting: Internal Medicine

## 2017-10-13 DIAGNOSIS — I358 Other nonrheumatic aortic valve disorders: Secondary | ICD-10-CM

## 2017-10-13 DIAGNOSIS — F01518 Vascular dementia, unspecified severity, with other behavioral disturbance: Secondary | ICD-10-CM

## 2017-10-13 DIAGNOSIS — Z91199 Patient's noncompliance with other medical treatment and regimen due to unspecified reason: Secondary | ICD-10-CM

## 2017-10-13 DIAGNOSIS — Z9119 Patient's noncompliance with other medical treatment and regimen: Secondary | ICD-10-CM

## 2017-10-13 DIAGNOSIS — F0151 Vascular dementia with behavioral disturbance: Secondary | ICD-10-CM | POA: Diagnosis not present

## 2017-10-13 NOTE — Assessment & Plan Note (Addendum)
10/13/17 Warfarin not in med list on AHT; it was discontinued 9/16 because of a PT/INR of 3.7.  PT/INR was to be repeated 9/18 Previous PT/INR was 2.2 on 10/05/2017 Restart warfarin in the morning based on PT/INR order written that warfarin can never be discontinued; it can be held temporarily based on the PT/INR result

## 2017-10-13 NOTE — Assessment & Plan Note (Signed)
Neurology follow-up is indicated.  There is a clinical question as to whether the Alzheimer drugs are appropriate with vascular dementia. Transport to that visit will be verified.

## 2017-10-13 NOTE — Progress Notes (Signed)
NURSING HOME LOCATION:  [facility] ROOM NUMBER:  101-A  CODE STATUS:  Full Code  PCP:  Pecola LawlessHopper, Cira Deyoe F, MD  [address] [place] [id number]  This is a nursing facility follow up of chronic medical diagnoses.   Interim medical record and care since last [facility] visit was updated with review of diagnostic studies and change in clinical status since last visit were documented.  HPI: Patient is a permanent resident of the facility admitted in August 2018.  She had been hospitalized with acute kidney injury in the context of decreased oral intake and dehydration.  Creatinine had peaked at 2.69. She has a diagnosis of progressive vascular dementia.  CAT scan in 2018 revealed no acute changes.  MRI in 2016 had revealed mild chronic small vessel ischemic changes with old lacunar cerebral infarcts.  Mental status testing at that time revealed mild dementia and behavioral disturbances.Her most recent SLUMS score was 14 out of 30. Neurology follow-up had been scheduled with Dr. Everlena CooperJaffe of [facility] but the chart states she was a "no-show".   This is most likely related to a transportation issue from the facility to his office. She has Lambl's excrescence of the aortic valve which is most likely nidus for embolic strokes.  She is to be on lifelong warfarin.  Apparently warfarin was discontinued (not just held) as per AHT yesterday 9/16. PT/INR was 3.7. PT/INR is to be repeated tomorrow 9/18.  She had been on 5 mg Tuesdays and Thursdays and 6 mg all other days.  Review of systems: Dementia invalidated responses.  She denies any active symptoms although she did pause for an extended period before answering "no" about depression.  She was slow in responding to the date.  Date was initially given as "19".  Subsequently she was able to say it was 2019.  She could not provide the month.  She did remember the name of her primary care physician and also knew the name of the  president.  Constitutional: No fever, significant weight change, fatigue  Eyes: No redness, discharge, pain, vision change ENT/mouth: No nasal congestion,  purulent discharge, earache, change in hearing, sore throat  Cardiovascular: No chest pain, palpitations, paroxysmal nocturnal dyspnea, claudication, edema  Respiratory: No cough, sputum production, hemoptysis, DOE, significant snoring, apnea   Gastrointestinal: No heartburn, dysphagia, abdominal pain, nausea /vomiting, rectal bleeding, melena, change in bowels Genitourinary: No dysuria, hematuria, pyuria, incontinence, nocturia Musculoskeletal: No joint stiffness, joint swelling, weakness, pain Dermatologic: No rash, pruritus, change in appearance of skin Neurologic: No dizziness, headache, syncope, seizures, numbness, tingling Psychiatric: No significant anxiety, insomnia, anorexia Endocrine: No change in hair/skin/nails, excessive thirst, excessive hunger, excessive urination  Hematologic/lymphatic: No significant bruising, lymphadenopathy, abnormal bleeding Allergy/immunology: No itchy/watery eyes, significant sneezing, urticaria, angioedema  Physical exam:  Pertinent or positive findings: She has an upper plate.  She does have some carious lower teeth.  There is no murmur despite her history of aortic valve lesions. General appearance: Adequately nourished; no acute distress, increased work of breathing is present.   Lymphatic: No lymphadenopathy about the head, neck, axilla. Eyes: No conjunctival inflammation or lid edema is present. There is no scleral icterus. Ears:  External ear exam shows no significant lesions or deformities.   Nose:  External nasal examination shows no deformity or inflammation. Nasal mucosa are pink and moist without lesions, exudates Oral exam:  Lips and gums are healthy appearing. There is no oropharyngeal erythema or exudate. Neck:  No thyromegaly,  masses, tenderness noted.    Heart:  Normal rate and  regular rhythm. S1 and S2 normal without gallop, murmur, click, rub .  Lungs: Chest clear to auscultation without wheezes, rhonchi, rales, rubs. Abdomen: Bowel sounds are normal. Abdomen is soft and nontender with no organomegaly, hernias, masses. GU: Deferred  Extremities:  No cyanosis, clubbing, edema  Neurologic exam : Strength equal  in upper & lower extremities Deep tendon reflexes are equal Skin: Warm & dry w/o tenting. No significant lesions or rash.  See summary under each active problem in the Problem List with associated updated therapeutic plan

## 2017-10-13 NOTE — Patient Instructions (Signed)
See assessment and plan under each diagnosis in the problem list and acutely for this visit 

## 2017-10-14 ENCOUNTER — Other Ambulatory Visit: Payer: Self-pay | Admitting: Internal Medicine

## 2017-10-14 ENCOUNTER — Encounter: Payer: Self-pay | Admitting: Internal Medicine

## 2017-10-14 DIAGNOSIS — M810 Age-related osteoporosis without current pathological fracture: Secondary | ICD-10-CM

## 2017-10-14 DIAGNOSIS — Z1231 Encounter for screening mammogram for malignant neoplasm of breast: Secondary | ICD-10-CM

## 2017-10-14 NOTE — Assessment & Plan Note (Signed)
10/13/17 she is not mentally competent to make & keep appointments SNF must verify transport

## 2017-10-15 ENCOUNTER — Ambulatory Visit: Payer: Self-pay | Admitting: Neurology

## 2017-10-15 ENCOUNTER — Encounter: Payer: Self-pay | Admitting: Internal Medicine

## 2017-10-15 NOTE — Progress Notes (Deleted)
NEUROLOGY FOLLOW UP OFFICE NOTE  Shannon AmenYvonne M Reid [id number]  HISTORY OF PRESENT ILLNESS: Shannon KohYvonne Reid is a 67 year old right-handed woman with type 2 diabetes, depression, hypertension, hypothyroidism, cerebrovascular disease, hyperlipidemia and history of tobacco abuse who follows up for Alzheimer's dementia and stroke.   UPDATE: I have not seen [name] since April 2017.  She missed follow up appointments.  She currently resides at [facility].  She currently takes donepezil 5mg  daily and memantine 10mg  twice daily.  She was hospitalized from 06/27/15 to 07/03/15 for stroke.  She presented with vertigo and left gaze deviation.  CT of head was negative, but MRI of brain, which was personally reviewed, revealed small patchy infarcts in the right cerebellum.  CTA of head demonstrated poor flow in the right superior cerebellar artery.  CTA of neck demonstrated tortuous carotids but no significant stenosis or occlusion.  Hgb A1c was 6.4.  LDL was 56.  TEE revealed Lambl's excrescences and she was subsequently started on Coumadin.  He was determined not to be a surgical candidate.  She had an EEG as well, which was unremarkable.  She was hospitalized again for stroke from 05/30/16 to 06/04/16.  She presented with right visual field cut.  CT of head revealed left occipital lobe infarct, possibly embolic.  Her INR was subtherapeutic 1.32 as she had been non-compliant.   LDL was 101.  Lipitor was increased to 80mg .     More recently, she was hospitalized for acute kidney injury due to decreased oral intake with dehydration.  There is a question whether she needs to continue donepezil and memantine due to diagnosis of vascular dementia.   HISTORY: She began noticing problems with memory a couple of months after her mother passed away from pancreatic cancer in February 2009.  She was very close to her mother and this was devastating to her.  She has suffered from depression since then.  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  She began to notice difficulty remembering how to perform tasks for work that she had done for many years.  She was able to hide her deficits from her bosses because she would ask others for help.  Her memory problems began to progress from there.  Often, she will walk into a room and forget what she was looking for.  She needs to bring a list to the grocery store.  She forgets names of people she just meets 5 minutes ago.  She reports that she often forgets to pay bills on time.  However, she denies problems with actually balancing her checkbook or correctly making calculations.  She sometimes forgets what she wants to say in conversation but denies word-finding difficulties.  On at least one occasion, she had to pull over on a familiar route in order to re-orient herself.  She denies trouble remembering names or faces of friends or family.  She denies remote memory problems.  She currently is on disability due to her memory problems and chronic back pain.  She has significant depression and is often tearful.  She reports that she is withdrawn and does not socialize much.  She is a Engineer, maintenance (IT)college graduate.  She lives with her boyfriend.  She denies family history of memory deficits.   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   She underwent neuropsychological testing on 10/11/14.  The impression of the test was an unspecified mild dementia with behavioral disturbance, which may be Alzheimer's disease.  However, possible suboptimal performance effort due to depression and post-traumatic stress, was also considered to play a factor.  Performance demonstrated declines in semantic knowledge, aspects of executive function, processing speed, visuoconstruction, and memory retrieval and recognition, which may be seen with Alzheimer's disease.  PAST  MEDICAL HISTORY: Past Medical History:  Diagnosis Date  . COPD (chronic obstructive pulmonary disease) (HCC)    on CT scan 06/28/2015  . Dementia   . Diabetes mellitus   . Hypertension   . Hypothyroidism   . Idiopathic acute pancreatitis 09/16/2011  . 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 Memory loss   . Pancreatitis 2010  . Tobacco abuse   . Trigger finger     MEDICATIONS: Current Outpatient Medications on File Prior to Visit  Medication Sig Dispense Refill  . amLODipine (NORVASC) 10 MG tablet Take 10 mg by mouth daily.    Marland Kitchen atorvastatin (LIPITOR) 20 MG tablet Take 20 mg by mouth at bedtime.     . donepezil (ARICEPT) 5 MG tablet Take 5 mg by mouth at bedtime.    Marland Kitchen levothyroxine (SYNTHROID, LEVOTHROID) 50 MCG tablet Take 50 mcg by mouth daily before breakfast.    . memantine (NAMENDA) 10 MG tablet Take 10 mg by mouth 2 (two) times daily.    . sertraline (ZOLOFT) 100 MG tablet Take 100 mg by mouth daily. Take with a 25 mg tablet to = 125 mg    . sertraline (ZOLOFT) 25 MG tablet Take 25 mg by mouth daily. Take with a 100 mg tablet to = 125 mg     No current facility-administered medications on file prior to visit.     ALLERGIES: No Known Allergies  FAMILY HISTORY: Family History  Problem Relation Age of Onset  . Alcohol abuse Father   . Cancer Mother    SOCIAL HISTORY: Social History   Socioeconomic History  . Marital status: Single    Spouse name: Not on file  . Number of children: Not on file  . Years of education: Not on file  . Highest education level: Not on file  Occupational History  . Not on file  Social Needs  . Financial resource strain: Not on file  . Food insecurity:    Worry: Not on file    Inability: Not on file  . Transportation needs:    Medical: Not on file    Non-medical: Not on file  Tobacco Use  . Smoking status: Former Smoker    Packs/day: 0.50    Types: Cigarettes    Start date: 07/01/2015  . Smokeless tobacco: Never Used  . Tobacco  comment: still in stress. looking for job.   Substance and Sexual Activity  . Alcohol use: No    Alcohol/week: 0.0 standard drinks  . Drug use: No  . Sexual activity: Not on file  Lifestyle  . Physical activity:    Days per week: Not on file    Minutes per session: Not on file  . Stress: Not on file  Relationships  . Social connections:    Talks on phone: Not on file    Gets together: Not on file    Attends religious service: Not on file    Active member of club or organization: Not on file    Attends meetings of clubs or organizations: Not on file    Relationship status: Not on file  .  Intimate partner violence:    Fear of current or ex partner: Not on file    Emotionally abused: Not on file    Physically abused: Not on file    Forced sexual activity: Not on file  Other Topics Concern  . Not on file  Social History Narrative   Resident of [facility].  No children.  Retired from [employer].  Education: 2 years of college.    REVIEW OF SYSTEMS: Constitutional: No fevers, chills, or sweats, no generalized fatigue, change in appetite Eyes: No visual changes, double vision, eye pain Ear, nose and throat: No hearing loss, ear pain, nasal congestion, sore throat Cardiovascular: No chest pain, palpitations Respiratory:  No shortness of breath at rest or with exertion, wheezes GastrointestinaI: No nausea, vomiting, diarrhea, abdominal pain, fecal incontinence Genitourinary:  No dysuria, urinary retention or frequency Musculoskeletal:  No neck pain, back pain Integumentary: No rash, pruritus, skin lesions Neurological: as above Psychiatric: No depression, insomnia, anxiety Endocrine: No palpitations, fatigue, diaphoresis, mood swings, change in appetite, change in weight, increased thirst Hematologic/Lymphatic:  No purpura, petechiae. Allergic/Immunologic: no itchy/runny eyes, nasal congestion, recent allergic reactions, rashes  PHYSICAL  EXAM: *** General: No acute distress.  Patient appears ***-groomed.  *** body habitus. Head:  Normocephalic/atraumatic Eyes:  Fundi examined but not visualized Neck: supple, no paraspinal tenderness, full range of motion Heart:  Regular rate and rhythm Lungs:  Clear to auscultation bilaterally Back: No paraspinal tenderness Neurological Exam: alert and oriented to person, place, and time. Attention span and concentration intact, recent and remote memory intact, fund of knowledge intact.  Speech fluent and not dysarthric, language intact.  CN II-XII intact. Bulk and tone normal, muscle strength 5/5 throughout.  Sensation to light touch, temperature and vibration intact.  Deep tendon reflexes 2+ throughout, toes downgoing.  Finger to nose and heel to shin testing intact.  Gait normal, Romberg negative.  IMPRESSION: Mixed Alzheimer's and vascular dementia.  She does have underlying Alzheimer's dementia that preceded her strokes.    PLAN: ***  Shannon Millet, DO  CC: ***

## 2017-10-16 ENCOUNTER — Encounter: Payer: Self-pay | Admitting: Neurology

## 2017-10-19 ENCOUNTER — Telehealth: Payer: Self-pay | Admitting: Neurology

## 2017-10-19 ENCOUNTER — Ambulatory Visit: Payer: Self-pay

## 2017-10-19 NOTE — Telephone Encounter (Signed)
Patient dismissed from [facility] by Shon MilletAdam Jaffe DO, effective 10/16/17. Dismissal letter sent out by 1st class mail.   LM

## 2017-10-23 LAB — PROTIME-INR: PROTIME: 26.6 — AB (ref 10.0–13.8)

## 2017-10-23 LAB — POCT INR: INR: 2.5 — AB (ref 0.9–1.1)

## 2017-11-02 LAB — POCT INR: INR: 2.4 — AB (ref 0.9–1.1)

## 2017-11-02 LAB — PROTIME-INR: Protime: 25.9 — AB (ref 10.0–13.8)

## 2017-11-03 ENCOUNTER — Telehealth: Payer: Self-pay | Admitting: Neurology

## 2017-11-03 NOTE — Telephone Encounter (Signed)
Received letter back - Return to Sender - but with a return address in [place], will remail 1st class mail to this address 11/02/17  LM

## 2017-11-12 ENCOUNTER — Non-Acute Institutional Stay (SKILLED_NURSING_FACILITY): Payer: Medicare Other

## 2017-11-12 DIAGNOSIS — Z0000 Encounter for general adult medical examination without abnormal findings: Secondary | ICD-10-CM | POA: Diagnosis not present

## 2017-11-12 NOTE — Progress Notes (Addendum)
Subjective:   [name] is a 67 y.o. female who presents for Medicare Annual (Subsequent) preventive examination at [facility]  Last AWV-11/25/2016    Objective:     Vitals: BP 120/72 (BP Location: Left Arm, Patient Position: Supine)   Pulse 64   Temp 97.9 F (36.6 C) (Oral)   Ht 5\' 5"  (1.651 m)   Wt 216 lb (98 kg)   BMI 35.94 kg/m   Body mass index is 35.94 kg/m.  Advanced Directives 11/12/2017 11/25/2016 09/13/2016 08/12/2016 07/31/2016 07/29/2016 07/28/2016  Does Patient Have a Medical Advance Directive? Yes No No Yes Yes Yes Yes  Type of Advance Directive (No Data) - - Out of facility DNR (pink MOST or yellow form) Out of facility DNR (pink MOST or yellow form) Out of facility DNR (pink MOST or yellow form) Out of facility DNR (pink MOST or yellow form)  Does patient want to make changes to medical advance directive? No - Patient declined - - No - Patient declined No - Patient declined No - Patient declined No - Patient declined  Would patient like information on creating a medical advance directive? - No - Patient declined No - Patient declined No - Patient declined No - Patient declined No - Patient declined No - Patient declined  Pre-existing out of facility DNR order (yellow form or pink MOST form) - - - Pink MOST form placed in chart (order not valid for inpatient use) Pink MOST form placed in chart (order not valid for inpatient use) Pink MOST form placed in chart (order not valid for inpatient use) Pink MOST form placed in chart (order not valid for inpatient use)    Tobacco Social History   Tobacco Use  Smoking Status Former Smoker  . Packs/day: 0.50  . Types: Cigarettes  . Start date: 07/01/2015  Smokeless Tobacco Never Used  Tobacco Comment   still in stress. looking for job.      Counseling given: Not Answered Comment: still in stress. looking for job.    Clinical Intake:  Pre-visit preparation completed: No  Pain : No/denies pain      Diabetes: Yes CBG done?: No Did pt. bring in CBG monitor from home?: No  How often do you need to have someone help you when you read instructions, pamphlets, or other written materials from your doctor or pharmacy?: 2 - Rarely What is the last grade level you completed in school?: college  Interpreter Needed?: No  Information entered by :: Tyron Russell, RN  Past Medical History:  Diagnosis Date  . COPD (chronic obstructive pulmonary disease) (HCC)    on CT scan 06/28/2015  . Dementia (HCC)   . Diabetes mellitus   . Hypertension   . Hypothyroidism   . Idiopathic acute pancreatitis 09/16/2011  . 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 Memory loss   . Pancreatitis 2010  . Tobacco abuse   . Trigger finger    Past Surgical History:  Procedure Laterality Date  . BACK SURGERY    . CHOLECYSTECTOMY  2003  . CYST REMOVAL HAND Left 12/11/2014   Procedure: LEFT RING FINGER TENDON SHEATH CYST EXCISION;  Surgeon: Mack Hook, MD;  Location: [facility];  Service: Orthopedics;  Laterality: Left;  . SPINE SURGERY  2006   Dr. Trey Sailors - L3-L4, L4-L5 laminotomy and foraminotomy  . TEE WITHOUT CARDIOVERSION N/A 07/03/2015   Procedure: TRANSESOPHAGEAL ECHOCARDIOGRAM (TEE);  Surgeon: Quintella Reichert, MD;  Location: [facility];  Service: Cardiovascular;  Laterality: N/A;   Family History  Problem Relation Age of Onset  . Alcohol abuse Father   . Cancer Mother    Social History   Socioeconomic History  . Marital status: Single    Spouse name: Not on file  . Number of children: Not on file  . Years of education: Not on file  . Highest education level: Not on file  Occupational History  . Not on file  Social Needs  . Financial resource strain: Not hard at all  . Food insecurity:    Worry: Never true    Inability: Never true  . Transportation needs:    Medical: No    Non-medical: No  Tobacco Use  . Smoking status: Former Smoker    Packs/day: 0.50    Types:  Cigarettes    Start date: 07/01/2015  . Smokeless tobacco: Never Used  . Tobacco comment: still in stress. looking for job.   Substance and Sexual Activity  . Alcohol use: No    Alcohol/week: 0.0 standard drinks  . Drug use: No  . Sexual activity: Not on file  Lifestyle  . Physical activity:    Days per week: 0 days    Minutes per session: 0 min  . Stress: Not at all  Relationships  . Social connections:    Talks on phone: Never    Gets together: Never    Attends religious service: Never    Active member of club or organization: No    Attends meetings of clubs or organizations: Never    Relationship status: Never married  Other Topics Concern  . Not on file  Social History Narrative   Resident of [facility].  No children.  Retired from [employer].  Education: 2 years of college.    Outpatient Encounter Medications as of 11/12/2017  Medication Sig  . amLODipine (NORVASC) 10 MG tablet Take 10 mg by mouth daily.  Marland Kitchen atorvastatin (LIPITOR) 20 MG tablet Take 20 mg by mouth at bedtime.   . donepezil (ARICEPT) 5 MG tablet Take 5 mg by mouth at bedtime.  Marland Kitchen levothyroxine (SYNTHROID, LEVOTHROID) 50 MCG tablet Take 50 mcg by mouth daily before breakfast.  . memantine (NAMENDA) 10 MG tablet Take 10 mg by mouth 2 (two) times daily.  . sertraline (ZOLOFT) 100 MG tablet Take 100 mg by mouth daily. Take with a 25 mg tablet to = 125 mg  . sertraline (ZOLOFT) 25 MG tablet Take 25 mg by mouth daily. Take with a 100 mg tablet to = 125 mg   No facility-administered encounter medications on file as of 11/12/2017.     Activities of Daily Living In your present state of health, do you have any difficulty performing the following activities: 11/12/2017 11/25/2016  Hearing? N N  Vision? N N  Difficulty concentrating or making decisions? [name]  Walking or climbing stairs? N N  Dressing or bathing? N N  Doing errands, shopping? N Y  Quarry manager and eating ? N Y    Using the Toilet? N N  In the past six months, have you accidently leaked urine? N N  Do you have problems with loss of bowel control? N N  Managing your Medications? Y Y  Managing your Finances? N Y  Housekeeping or managing your Housekeeping? N Y  Some recent data might be hidden    Patient Care Team: Pecola Lawless, MD as PCP - General (Internal Medicine) Jamas Lav, MD as  Consulting Physician (Psychiatry) Trey Sailors, MD as Consulting Physician (Neurosurgery) Medina-Vargas, Margit Banda, NP as Nurse Practitioner (Internal Medicine)    Assessment:   This is a routine wellness examination for Rosali.  Exercise Activities and Dietary recommendations Current Exercise Habits: The patient does not participate in regular exercise at present, Exercise limited by: neurologic condition(s)  Goals   None     Fall Risk Fall Risk  11/12/2017 11/25/2016 05/04/2015 09/23/2013  Falls in the past year? No No No No   Is the patient's home free of loose throw rugs in walkways, pet beds, electrical cords, etc?   yes      Grab bars in the bathroom? yes      Handrails on the stairs?   yes      Adequate lighting?   yes  Depression Screen PHQ 2/9 Scores 11/12/2017 11/25/2016 09/23/2013 04/22/2013  PHQ - 2 Score 0 0 0 1     Cognitive Function MMSE - Mini Mental State Exam 09/20/2016 06/01/2016  Orientation to time 0 0  Orientation to Place 5 5  Registration 3 3  Attention/ Calculation 0 2  Recall 0 1  Language- name 2 objects 2 2  Language- repeat 1 1  Language- follow 3 step command 2 2  Language- read & follow direction 1 1  Write a sentence 1 1  Copy design 0 0  Total score 15 18   Montreal Cognitive Assessment  05/04/2015 07/03/2014  Visuospatial/ Executive (0/5) 3 3  Naming (0/3) 3 3  Attention: Read list of digits (0/2) 2 2  Attention: Read list of letters (0/1) 1 1  Attention: Serial 7 subtraction starting at 100 (0/3) 1 3  Language: Repeat phrase (0/2) 2 2  Language : Fluency  (0/1) 0 1  Abstraction (0/2) 2 0  Delayed Recall (0/5) 0 0  Orientation (0/6) 6 5  Total 20 20  Adjusted Score (based on education) 20 20   6CIT Screen 11/12/2017 11/25/2016  What Year? 0 points 0 points  What month? 0 points 0 points  What time? 0 points 3 points  Count back from 20 0 points 0 points  Months in reverse 4 points 0 points  Repeat phrase 4 points 10 points  Total Score 8 13    Immunization History  Administered Date(s) Administered  . Influenza Split 11/03/2011  . Influenza,inj,Quad PF,6+ Mos 11/17/2012  . Pneumococcal-Unspecified 12/08/2016  . Tdap 12/19/2011    Qualifies for Shingles Vaccine? Not in past records  Screening Tests Health Maintenance  Topic Date Due  . DEXA SCAN  01/09/2016  . MAMMOGRAM  02/25/2016  . INFLUENZA VACCINE  08/27/2017  . HEMOGLOBIN A1C  10/18/2017  . COLONOSCOPY  07/05/2025 (Originally 01/08/2001)  . PNA vac Low Risk Adult (2 of 2 - PCV13) 12/08/2017  . TETANUS/TDAP  12/18/2021  . Hepatitis C Screening  Completed  . FOOT EXAM  Discontinued  . OPHTHALMOLOGY EXAM  Discontinued    Cancer Screenings: Lung: Low Dose CT Chest recommended if Age 55-80 years, 30 pack-year currently smoking OR have quit w/in 15years. Patient does not qualify. Breast:  Up to date on Mammogram? No, refused Up to date of Bone Density/Dexa? No, refused Colorectal: due, refused  Additional Screenings:  Hepatitis C Screening: declined Flu vaccine due: will receive at [facility]:    I have personally reviewed and addressed the Medicare Annual Wellness questionnaire and have noted the following in the patient's chart:  A. Medical and social history  B. Use of alcohol, tobacco or illicit drugs  C. Current medications and supplements D. Functional ability and status E.  Nutritional status F.  Physical activity G. Advance directives H. List of other physicians I.  Hospitalizations, surgeries, and ER visits in previous 12 months J.   Vitals K. Screenings to include hearing, vision, cognitive, depression L. Referrals and appointments - none  In addition, I have reviewed and discussed with patient certain preventive protocols, quality metrics, and best practice recommendations. A written personalized care plan for preventive services as well as general preventive health recommendations were provided to patient.  See attached scanned questionnaire for additional information.   Signed,   Tyron Russell, RN Nurse Health Advisor  Patient Concerns: None Patient is non compliant with appointments for consultation or procedures out of facility. I have personally reviewed the health advisor's clinical note, was available for consultation, and agree with the assessment and plan as written. Pecola Lawless M.D., FACP, [facility]

## 2017-11-12 NOTE — Patient Instructions (Addendum)
[name] , Thank you for taking time to come for your Medicare Wellness Visit. I appreciate your ongoing commitment to your health goals. Please review the following plan we discussed and let me know if I can assist you in the future.   Screening recommendations/referrals: Colonoscopy due, refused Mammogram due, refused Bone Density due, refused Recommended yearly ophthalmology/optometry visit for glaucoma screening and checkup Recommended yearly dental visit for hygiene and checkup  Vaccinations: Influenza vaccine due, will receive at [facility] Pneumococcal vaccine up to date, due 12/08/2017 Tdap vaccine up to date, due 12/18/2021 Shingles vaccine not in past records    Advanced directives: in chart  Conditions/risks identified: none  Next appointment: Dr. Alwyn Ren makes rounds   Preventive Care 65 Years and Older, Female Preventive care refers to lifestyle choices and visits with your health care provider that can promote health and wellness. What does preventive care include?  A yearly physical exam. This is also called an annual well check.  Dental exams once or twice a year.  Routine eye exams. Ask your health care provider how often you should have your eyes checked.  Personal lifestyle choices, including:  Daily care of your teeth and gums.  Regular physical activity.  Eating a healthy diet.  Avoiding tobacco and drug use.  Limiting alcohol use.  Practicing safe sex.  Taking low-dose aspirin every day.  Taking vitamin and mineral supplements as recommended by your health care provider. What happens during an annual well check? The services and screenings done by your health care provider during your annual well check will depend on your age, overall health, lifestyle risk factors, and family history of disease. Counseling  Your health care provider may ask you questions about your:  Alcohol use.  Tobacco use.  Drug use.  Emotional well-being.  Home  and relationship well-being.  Sexual activity.  Eating habits.  History of falls.  Memory and ability to understand (cognition).  Work and work Astronomer.  Reproductive health. Screening  You may have the following tests or measurements:  Height, weight, and BMI.  Blood pressure.  Lipid and cholesterol levels. These may be checked every 5 years, or more frequently if you are over 56 years old.  Skin check.  Lung cancer screening. You may have this screening every year starting at age 35 if you have a 30-pack-year history of smoking and currently smoke or have quit within the past 15 years.  Fecal occult blood test (FOBT) of the stool. You may have this test every year starting at age 57.  Flexible sigmoidoscopy or colonoscopy. You may have a sigmoidoscopy every 5 years or a colonoscopy every 10 years starting at age 24.  Hepatitis C blood test.  Hepatitis B blood test.  Sexually transmitted disease (STD) testing.  Diabetes screening. This is done by checking your blood sugar (glucose) after you have not eaten for a while (fasting). You may have this done every 1-3 years.  Bone density scan. This is done to screen for osteoporosis. You may have this done starting at age 1.  Mammogram. This may be done every 1-2 years. Talk to your health care provider about how often you should have regular mammograms. Talk with your health care provider about your test results, treatment options, and if necessary, the need for more tests. Vaccines  Your health care provider may recommend certain vaccines, such as:  Influenza vaccine. This is recommended every year.  Tetanus, diphtheria, and acellular pertussis (Tdap, Td) vaccine. You may need a  Td booster every 10 years.  Zoster vaccine. You may need this after age 29.  Pneumococcal 13-valent conjugate (PCV13) vaccine. One dose is recommended after age 85.  Pneumococcal polysaccharide (PPSV23) vaccine. One dose is recommended  after age 10. Talk to your health care provider about which screenings and vaccines you need and how often you need them. This information is not intended to replace advice given to you by your health care provider. Make sure you discuss any questions you have with your health care provider. Document Released: 02/09/2015 Document Revised: 10/03/2015 Document Reviewed: 11/14/2014 Elsevier Interactive Patient Education  2017 [place] Prevention in the Home Falls can cause injuries. They can happen to people of all ages. There are many things you can do to make your home safe and to help prevent falls. What can I do on the outside of my home?  Regularly fix the edges of walkways and driveways and fix any cracks.  Remove anything that might make you trip as you walk through a door, such as a raised step or threshold.  Trim any bushes or trees on the path to your home.  Use bright outdoor lighting.  Clear any walking paths of anything that might make someone trip, such as rocks or tools.  Regularly check to see if handrails are loose or broken. Make sure that both sides of any steps have handrails.  Any raised decks and porches should have guardrails on the edges.  Have any leaves, snow, or ice cleared regularly.  Use sand or salt on walking paths during winter.  Clean up any spills in your garage right away. This includes oil or grease spills. What can I do in the bathroom?  Use night lights.  Install grab bars by the toilet and in the tub and shower. Do not use towel bars as grab bars.  Use non-skid mats or decals in the tub or shower.  If you need to sit down in the shower, use a plastic, non-slip stool.  Keep the floor dry. Clean up any water that spills on the floor as soon as it happens.  Remove soap buildup in the tub or shower regularly.  Attach bath mats securely with double-sided non-slip rug tape.  Do not have throw rugs and other things on the floor  that can make you trip. What can I do in the bedroom?  Use night lights.  Make sure that you have a light by your bed that is easy to reach.  Do not use any sheets or blankets that are too big for your bed. They should not hang down onto the floor.  Have a firm chair that has side arms. You can use this for support while you get dressed.  Do not have throw rugs and other things on the floor that can make you trip. What can I do in the kitchen?  Clean up any spills right away.  Avoid walking on wet floors.  Keep items that you use a lot in easy-to-reach places.  If you need to reach something above you, use a strong step stool that has a grab bar.  Keep electrical cords out of the way.  Do not use floor polish or wax that makes floors slippery. If you must use wax, use non-skid floor wax.  Do not have throw rugs and other things on the floor that can make you trip. What can I do with my stairs?  Do not leave any items on the stairs.  Make sure that there are handrails on both sides of the stairs and use them. Fix handrails that are broken or loose. Make sure that handrails are as long as the stairways.  Check any carpeting to make sure that it is firmly attached to the stairs. Fix any carpet that is loose or worn.  Avoid having throw rugs at the top or bottom of the stairs. If you do have throw rugs, attach them to the floor with carpet tape.  Make sure that you have a light switch at the top of the stairs and the bottom of the stairs. If you do not have them, ask someone to add them for you. What else can I do to help prevent falls?  Wear shoes that:  Do not have high heels.  Have rubber bottoms.  Are comfortable and fit you well.  Are closed at the toe. Do not wear sandals.  If you use a stepladder:  Make sure that it is fully opened. Do not climb a closed stepladder.  Make sure that both sides of the stepladder are locked into place.  Ask someone to hold it  for you, if possible.  Clearly mark and make sure that you can see:  Any grab bars or handrails.  First and last steps.  Where the edge of each step is.  Use tools that help you move around (mobility aids) if they are needed. These include:  Canes.  Walkers.  Scooters.  Crutches.  Turn on the lights when you go into a dark area. Replace any light bulbs as soon as they burn out.  Set up your furniture so you have a clear path. Avoid moving your furniture around.  If any of your floors are uneven, fix them.  If there are any pets around you, be aware of where they are.  Review your medicines with your doctor. Some medicines can make you feel dizzy. This can increase your chance of falling. Ask your doctor what other things that you can do to help prevent falls. This information is not intended to replace advice given to you by your health care provider. Make sure you discuss any questions you have with your health care provider. Document Released: 11/09/2008 Document Revised: 06/21/2015 Document Reviewed: 02/17/2014 Elsevier Interactive Patient Education  2017 [employer].

## 2017-11-23 LAB — PROTIME-INR: Protime: 30.1 — AB (ref 10.0–13.8)

## 2017-11-23 LAB — POCT INR: INR: 3 — AB (ref 0.9–1.1)

## 2017-11-30 LAB — POCT INR: INR: 2 — AB (ref 0.9–1.1)

## 2017-11-30 LAB — PROTIME-INR: Protime: 22.5 — AB (ref 10.0–13.8)

## 2017-12-09 ENCOUNTER — Other Ambulatory Visit: Payer: Self-pay

## 2017-12-31 ENCOUNTER — Encounter: Payer: Self-pay | Admitting: Internal Medicine

## 2017-12-31 ENCOUNTER — Non-Acute Institutional Stay (SKILLED_NURSING_FACILITY): Payer: Medicare Other | Admitting: Internal Medicine

## 2017-12-31 DIAGNOSIS — I358 Other nonrheumatic aortic valve disorders: Secondary | ICD-10-CM | POA: Diagnosis not present

## 2017-12-31 DIAGNOSIS — F01518 Vascular dementia, unspecified severity, with other behavioral disturbance: Secondary | ICD-10-CM

## 2017-12-31 DIAGNOSIS — F0151 Vascular dementia with behavioral disturbance: Secondary | ICD-10-CM

## 2017-12-31 DIAGNOSIS — E1149 Type 2 diabetes mellitus with other diabetic neurological complication: Secondary | ICD-10-CM | POA: Diagnosis not present

## 2017-12-31 NOTE — Assessment & Plan Note (Addendum)
Lifelong anticoagulation with warfarin is essential to prevent recurrent emboli PT/INRs have been therapeutic

## 2017-12-31 NOTE — Patient Instructions (Signed)
See assessment and plan under each diagnosis in the problem list and acutely for this visit 

## 2017-12-31 NOTE — Assessment & Plan Note (Addendum)
Her dementia is significant but not associated with behavioral disorders beyond non compliance with appointments, refusing to go the morning of scheduled follow up. Clinically she appears to be stable.

## 2017-12-31 NOTE — Assessment & Plan Note (Addendum)
Minor hyperglycemia has been documented; A1c will be updated

## 2017-12-31 NOTE — Progress Notes (Signed)
    NURSING HOME LOCATION:  [facility] ROOM NUMBER:  [id number]  CODE STATUS:  Full Code  PCP:  Pecola LawlessHopper, [name] F, MD  [address] [place] [id number]  This is a nursing facility follow up of chronic medical diagnoses.    Interim medical record and care since last [facility] visit was updated with review of diagnostic studies and change in clinical status since last visit were documented.  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  Neurology has released her as she failed multiple follow-up appointments.  Most recent referral was made by me and she agreed to go until the morning of the appointment. 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 She has had mild hyperglycemia at the SNF with values from 110 up to 126.  A1c was last done on 3/19 and was 6.8%.  Review of systems: Dementia invalidated responses.  She denies any active health issues to an extensive review of systems.  She gave the date as February,?,  2019.  Constitutional: No fever, significant weight change, fatigue  Eyes: No redness, discharge, pain, vision change ENT/mouth: No nasal congestion,  purulent discharge, earache, change in hearing, sore throat  Cardiovascular: No chest pain, palpitations, paroxysmal nocturnal dyspnea, claudication, edema  Respiratory: No cough, sputum production, hemoptysis, DOE, significant snoring, apnea   Gastrointestinal: No heartburn, dysphagia, abdominal pain, nausea /vomiting, rectal bleeding, melena, change in bowels Genitourinary: No dysuria, hematuria, pyuria, incontinence, nocturia Musculoskeletal: No joint stiffness, joint swelling, weakness, pain Dermatologic: No rash, pruritus, change in appearance of skin Neurologic: No dizziness, headache, syncope, seizures,  numbness, tingling Psychiatric: No significant anxiety, depression, insomnia, anorexia Endocrine: No change in hair/skin/nails, excessive thirst, excessive hunger, excessive urination  Hematologic/lymphatic: No significant bruising, lymphadenopathy, abnormal bleeding Allergy/immunology: No itchy/watery eyes, significant sneezing, urticaria, angioedema  Physical exam:  Pertinent or positive findings: She has an upper plate.  She exhibits bradycardia.  She is most pleasant and Patent examinerpolitely interactive. General appearance: Adequately nourished; no acute distress, increased work of breathing is present.   Lymphatic: No lymphadenopathy about the head, neck, axilla. Eyes: No conjunctival inflammation or lid edema is present. There is no scleral icterus. Ears:  External ear exam shows no significant lesions or deformities.   Nose:  External nasal examination shows no deformity or inflammation. Nasal mucosa are pink and moist without lesions, exudates Oral exam:  Lips and gums are healthy appearing. There is no oropharyngeal erythema or exudate. Neck:  No thyromegaly, masses, tenderness noted.    Heart:  S1 and S2 normal without gallop, murmur, click, rub .  Lungs: Chest clear to auscultation without wheezes, rhonchi, rales, rubs. Abdomen: Bowel sounds are normal. Abdomen is soft and nontender with no organomegaly, hernias, masses. GU: Deferred  Extremities:  No cyanosis, clubbing, edema  Neurologic exam : Strength equal  in upper & lower extremities Deep tendon reflexes are equal Skin: Warm & dry w/o tenting. No significant lesions or rash.  See summary under each active problem in the Problem List with associated updated therapeutic plan

## 2018-01-01 LAB — BASIC METABOLIC PANEL
BUN: 20 (ref 4–21)
Creatinine: 0.9 (ref 0.5–1.1)
Glucose: 119
Potassium: 3.3 — AB (ref 3.4–5.3)
Sodium: 142 (ref 137–147)

## 2018-01-01 LAB — HEMOGLOBIN A1C: HEMOGLOBIN A1C: 7

## 2018-01-11 LAB — PROTIME-INR: Protime: 29.3 — AB (ref 10.0–13.8)

## 2018-01-11 LAB — POCT INR: INR: 2.9 — AB (ref 0.9–1.1)

## 2018-01-18 LAB — POCT INR: INR: 2.7 — AB (ref 0.9–1.1)

## 2018-01-18 LAB — PROTIME-INR: Protime: 27.8 — AB (ref 10.0–13.8)

## 2018-03-16 LAB — LIPID PANEL
CHOLESTEROL: 142 (ref 0–200)
HDL: 52 (ref 35–70)
LDL Cholesterol: 72
LDl/HDL Ratio: 5.8
Triglycerides: 87 (ref 40–160)

## 2018-03-16 LAB — CBC AND DIFFERENTIAL
HCT: 41 (ref 36–46)
Hemoglobin: 13.8 (ref 12.0–16.0)
PLATELETS: 147 — AB (ref 150–399)

## 2018-03-16 LAB — BASIC METABOLIC PANEL
BUN: 21 (ref 4–21)
Creatinine: 1 (ref 0.5–1.1)
GLUCOSE: 117
Potassium: 3.9 (ref 3.4–5.3)
Sodium: 142 (ref 137–147)

## 2018-03-16 LAB — HEPATIC FUNCTION PANEL
ALT: 24 (ref 7–35)
AST: 32 (ref 13–35)

## 2018-03-16 LAB — TSH: TSH: 2.25 (ref 0.41–5.90)

## 2018-03-20 LAB — MICROALBUMIN, URINE: MICROALB UR: 5.2

## 2018-03-26 LAB — HEPATIC FUNCTION PANEL
ALT: 27 (ref 7–35)
AST: 34 (ref 13–35)

## 2018-03-26 LAB — LIPID PANEL
Cholesterol: 129 (ref 0–200)
HDL: 51 (ref 35–70)
LDL Cholesterol: 61
Triglycerides: 85 (ref 40–160)

## 2018-03-26 LAB — TSH: TSH: 1.18 (ref 0.41–5.90)

## 2018-03-26 LAB — CBC AND DIFFERENTIAL
HCT: 42 (ref 36–46)
Hemoglobin: 14.1 (ref 12.0–16.0)
Platelets: 168 (ref 150–399)
WBC: 6.9

## 2018-03-26 LAB — BASIC METABOLIC PANEL
BUN: 24 — AB (ref 4–21)
Creatinine: 1.1 (ref 0.5–1.1)
GLUCOSE: 123
Potassium: 3.5 (ref 3.4–5.3)
Sodium: 140 (ref 137–147)

## 2018-04-08 ENCOUNTER — Encounter: Payer: Self-pay | Admitting: Internal Medicine

## 2018-04-08 ENCOUNTER — Non-Acute Institutional Stay (SKILLED_NURSING_FACILITY): Payer: Medicare Other | Admitting: Internal Medicine

## 2018-04-08 DIAGNOSIS — E034 Atrophy of thyroid (acquired): Secondary | ICD-10-CM

## 2018-04-08 DIAGNOSIS — E440 Moderate protein-calorie malnutrition: Secondary | ICD-10-CM

## 2018-04-08 DIAGNOSIS — E559 Vitamin D deficiency, unspecified: Secondary | ICD-10-CM | POA: Diagnosis not present

## 2018-04-08 DIAGNOSIS — D696 Thrombocytopenia, unspecified: Secondary | ICD-10-CM | POA: Diagnosis not present

## 2018-04-08 DIAGNOSIS — J410 Simple chronic bronchitis: Secondary | ICD-10-CM

## 2018-04-08 DIAGNOSIS — E1149 Type 2 diabetes mellitus with other diabetic neurological complication: Secondary | ICD-10-CM

## 2018-04-08 DIAGNOSIS — F321 Major depressive disorder, single episode, moderate: Secondary | ICD-10-CM

## 2018-04-08 LAB — COMPLETE METABOLIC PANEL WITH GFR
Albumin: 3.9
Albumin: 4
CO2: 21
CO2: 23
Calcium: 9.3
Calcium: 9.3
Chloride: 105
Chloride: 107
Globulin: 3.2
TOTAL PROTEIN: 7.3 (ref 6.4–8.2)

## 2018-04-08 LAB — MICROALBUMIN / CREATININE URINE RATIO
Creatinine, Ur: 216 mg/dL
MICROALB/CREAT RATIO: 23.9

## 2018-04-08 LAB — LIPID PANEL: Vitamin D, 25-Hydroxy: 20.94

## 2018-04-08 NOTE — Assessment & Plan Note (Signed)
TSH therapeutic

## 2018-04-08 NOTE — Progress Notes (Signed)
   NURSING HOME LOCATION:  [facility]  ROOM NUMBER:  101/A  CODE STATUS: Full Code   PCP:  Pecola Lawless MD.  This is a nursing facility follow up of chronic medical diagnoses  Interim medical record and care since last [facility] visit was updated with review of diagnostic studies and change in clinical status since last visit were documented.  HPI: She is a permanent resident of the facility with advanced dementia, diabetes, history of bronchitis, thrombocytopenia, protein/caloric malnutrition, and major depression. No behavioral issues reported other than refusal to be transported for scheduled procedures or consultations. Labs are current with no significant abnormalities of chemistries, electrolytes, renal function.  Thyroid function is also normal.  Review of systems: Dementia invalidated responses.  She was unable to give the date.  She answered "no" to an extensive review of systems queries including depression.  Constitutional: No fever, significant weight change, fatigue  Eyes: No redness, discharge, pain, vision change ENT/mouth: No nasal congestion,  purulent discharge, earache, change in hearing, sore throat  Cardiovascular: No chest pain, palpitations, paroxysmal nocturnal dyspnea, claudication, edema  Respiratory: No cough, sputum production, hemoptysis, DOE, significant snoring, apnea   Gastrointestinal: No heartburn, dysphagia, abdominal pain, nausea /vomiting, rectal bleeding, melena, change in bowels Genitourinary: No dysuria, hematuria, pyuria, incontinence, nocturia Musculoskeletal: No joint stiffness, joint swelling, weakness, pain Dermatologic: No rash, pruritus, change in appearance of skin Neurologic: No dizziness, headache, syncope, seizures, numbness, tingling Psychiatric: No significant anxiety, depression, insomnia, anorexia Endocrine: No change in hair/skin/nails, excessive thirst, excessive hunger, excessive urination   Hematologic/lymphatic: No significant bruising, lymphadenopathy, abnormal bleeding Allergy/immunology: No itchy/watery eyes, significant sneezing, urticaria, angioedema  Physical exam:  Pertinent or positive findings: She appears healthy and well-nourished. Upper dental plate present. The second heart sound is increased. General appearance: Adequately nourished; no acute distress, increased work of breathing is present.   Lymphatic: No lymphadenopathy about the head, neck, axilla. Eyes: No conjunctival inflammation or lid edema is present. There is no scleral icterus. Ears:  External ear exam shows no significant lesions or deformities.   Nose:  External nasal examination shows no deformity or inflammation. Nasal mucosa are pink and moist without lesions, exudates Oral exam:  Lips and gums are healthy appearing. There is no oropharyngeal erythema or exudate. Neck:  No thyromegaly, masses, tenderness noted.    Heart:  Normal rate and regular rhythm. S1 normal without gallop, murmur, click, rub .  Lungs: Chest clear to auscultation without wheezes, rhonchi, rales, rubs. Abdomen: Bowel sounds are normal. Abdomen is soft and nontender with no organomegaly, hernias, masses. GU: Deferred  Extremities:  No cyanosis, clubbing, edema  Neurologic exam : Strength equal  in upper & lower extremities Deep tendon reflexes are equal Skin: Warm & dry w/o tenting. No significant lesions or rash.  See summary under each active problem in the Problem List with associated updated therapeutic plan

## 2018-04-09 ENCOUNTER — Encounter: Payer: Self-pay | Admitting: Internal Medicine

## 2018-04-09 LAB — HEMOGLOBIN A1C: Hemoglobin A1C: 7.1

## 2018-04-09 NOTE — Patient Instructions (Signed)
See assessment and plan under each diagnosis in the problem list and acutely for this visit 

## 2018-05-25 LAB — POCT INR: INR: 2.2 — AB (ref 0.9–1.1)

## 2018-05-25 LAB — PROTIME-INR: Protime: 24.3 — AB (ref 10.0–13.8)

## 2018-07-21 ENCOUNTER — Non-Acute Institutional Stay (SKILLED_NURSING_FACILITY): Payer: Medicare Other | Admitting: Internal Medicine

## 2018-07-21 ENCOUNTER — Encounter: Payer: Self-pay | Admitting: Internal Medicine

## 2018-07-21 DIAGNOSIS — F01518 Vascular dementia, unspecified severity, with other behavioral disturbance: Secondary | ICD-10-CM

## 2018-07-21 DIAGNOSIS — I358 Other nonrheumatic aortic valve disorders: Secondary | ICD-10-CM | POA: Diagnosis not present

## 2018-07-21 DIAGNOSIS — E1149 Type 2 diabetes mellitus with other diabetic neurological complication: Secondary | ICD-10-CM

## 2018-07-21 DIAGNOSIS — E034 Atrophy of thyroid (acquired): Secondary | ICD-10-CM

## 2018-07-21 DIAGNOSIS — F0151 Vascular dementia with behavioral disturbance: Secondary | ICD-10-CM | POA: Diagnosis not present

## 2018-07-21 NOTE — Assessment & Plan Note (Signed)
Despite the bradycardia intermittently, TSH was therapeutic on 03/26/2018 with a value of 1.18.  It had been decreasing progressively and will need to be repeated following resolution of the coronavirus quarantine.

## 2018-07-21 NOTE — Assessment & Plan Note (Addendum)
Her only behavioral issues is the noncompliance with recommended neurologic and gynecologic follow-up.  Appointments have been made and she agrees to go; however, on the day of the appointment she declines to be transported.  This has been a repetitive pattern.

## 2018-07-21 NOTE — Assessment & Plan Note (Addendum)
Lifelong warfarin is mandatory because of the risk of emboli and recurrent CVA.

## 2018-07-21 NOTE — Assessment & Plan Note (Signed)
A1c is current at 7.1% as of 04/09/2018.  This indicates adequate control.  A1c should be monitored in approximately 1 month following resolution of the quarantine.

## 2018-07-21 NOTE — Patient Instructions (Signed)
See assessment and plan under each diagnosis in the problem list and acutely for this visit 

## 2018-07-21 NOTE — Progress Notes (Signed)
NURSING HOME LOCATION:  [facility] ROOM NUMBER:  110-A  CODE STATUS:  Full Code  PCP:  Hendricks Limes, MD  [place] [address]   This is a nursing facility follow up of chronic medical diagnoses.  Interim medical record and care since last [facility] visit was updated with review of diagnostic studies and change in clinical status since last visit were documented.  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  Other diagnoses include essential hypertension, hypothyroidism, diabetes, dementia, COPD and history of idiopathic pancreatitis. The valvular lesion was diagnosed on TEE in 2017. She did quit smoking in 2017.  Glucoses have been well controlled with a range of 139 up to 182.  A1c indicated adequate control with a value of 7.1% on 04/09/2018.  This should be repeated next month.   She has exhibited bradycardia intermittently with a low of 52 and a high of 90.  This is asymptomatic.  She is not on any rate controlling agents; she is on a vasodilating calcium channel blocker.  She has been compliant with her L-thyroxine replacement.  TSH was therapeutic at 1.18 on 03/26/2018.  Review of systems: Dementia invalidated responses.  She was in bed although was afternoon.She was wearing a mask; she states she sleeps with it.  When asked why she was wearing it, she states that "I do not want to get sick".  She had no knowledge of the coronavirus epidemic.  She denied any infectious symptoms.  In fact she had no active symptoms at all.  Constitutional: No fever, significant weight change, fatigue  Eyes: No redness, discharge, pain, vision change ENT/mouth: No nasal congestion,  purulent discharge, earache, change in hearing, sore throat  Cardiovascular: No chest pain, palpitations, paroxysmal nocturnal dyspnea, claudication, edema  Respiratory: No  cough, sputum production, hemoptysis, DOE, significant snoring, apnea   Gastrointestinal: No heartburn, dysphagia, abdominal pain, nausea /vomiting, rectal bleeding, melena, change in bowels Genitourinary: No dysuria, hematuria, pyuria, incontinence, nocturia Musculoskeletal: No joint stiffness, joint swelling, weakness, pain Dermatologic: No rash, pruritus, change in appearance of skin Neurologic: No dizziness, headache, syncope, seizures, numbness, tingling Psychiatric: No significant anxiety, depression, insomnia, anorexia Endocrine: No change in hair/skin/nails, excessive thirst, excessive hunger, excessive urination  Hematologic/lymphatic: No significant bruising, lymphadenopathy, abnormal bleeding Allergy/immunology: No itchy/watery eyes, significant sneezing, urticaria, angioedema  Physical exam:  Pertinent or positive findings: Heart rate is slow but regular.  Pedal pulses were surprisingly strong.  She does have clubbing of the fingernails.  General appearance: Adequately nourished; no acute distress, increased work of breathing is present.   Lymphatic: No lymphadenopathy about the head, neck, axilla. Eyes: No conjunctival inflammation or lid edema is present. There is no scleral icterus. Ears:  External ear exam shows no significant lesions or deformities.   Nose:  External nasal examination shows no deformity or inflammation. Nasal mucosa are pink and moist without lesions, exudates Oral exam:  Lips and gums are healthy appearing. There is no oropharyngeal erythema or exudate. Neck:  No thyromegaly, masses, tenderness noted.    Heart:  No gallop, murmur, click, rub .  Lungs: Chest clear to auscultation without wheezes, rhonchi, rales, rubs. Abdomen: Bowel sounds are normal. Abdomen is soft and nontender with no organomegaly, hernias, masses. GU: Deferred  Extremities:  No cyanosis, edema  Neurologic exam : Strength equal  in upper & lower extremities Balance, Rhomberg, finger  to nose testing could not be completed due to clinical state Skin: Warm & dry w/o tenting. No significant lesions or rash.  See summary under each active problem in the Problem List with associated updated therapeutic plan

## 2018-08-10 LAB — PROTIME-INR: Protime: 28 — AB (ref 10.0–13.8)

## 2018-08-10 LAB — POCT INR: INR: 2.7 — AB (ref 0.9–1.1)

## 2018-08-20 LAB — POCT INR: INR: 2.3 — AB (ref 0.9–1.1)

## 2018-10-11 ENCOUNTER — Non-Acute Institutional Stay (SKILLED_NURSING_FACILITY): Payer: Medicare Other | Admitting: Internal Medicine

## 2018-10-11 ENCOUNTER — Encounter: Payer: Self-pay | Admitting: Internal Medicine

## 2018-10-11 DIAGNOSIS — Z7901 Long term (current) use of anticoagulants: Secondary | ICD-10-CM

## 2018-10-11 DIAGNOSIS — R413 Other amnesia: Secondary | ICD-10-CM

## 2018-10-11 DIAGNOSIS — E1149 Type 2 diabetes mellitus with other diabetic neurological complication: Secondary | ICD-10-CM | POA: Diagnosis not present

## 2018-10-11 DIAGNOSIS — Z8673 Personal history of transient ischemic attack (TIA), and cerebral infarction without residual deficits: Secondary | ICD-10-CM

## 2018-10-11 NOTE — Assessment & Plan Note (Addendum)
A1c will be updated once COVID-19 quarantine is lifted. Recorded glucoses do not suggest poor control.

## 2018-10-11 NOTE — Patient Instructions (Signed)
See assessment and plan under each diagnosis in the problem list and acutely for this visit 

## 2018-10-11 NOTE — Assessment & Plan Note (Signed)
[facility] NP monitoring PT/INR

## 2018-10-11 NOTE — Progress Notes (Signed)
  NURSING HOME LOCATION:  [facility] ROOM NUMBER:  110-A  CODE STATUS:  Full Code  PCP:  Hendricks Limes, MD  [place] [address]  This is a nursing facility follow up of chronic medical diagnoses.    Interim medical record and care since last [place] visit was updated with review of diagnostic studies and change in clinical status since last visit were documented.  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 She is on lifelong Coumadin because of the embolic strokes. [facility] NP monitors her PT/INR. Labs are not current.  Glucoses recently ranged from 101 up to 161.  Only four values were over 140 suggesting good control.  A1c was 7.1% in March. Screening studies such as mammograms have not been completed as the patient refuses to go for the appointments when transportation attempts to transfer her for the office visits.  Review of systems: Dementia invalidated responses.  She denies any symptoms at all with special reference to COVID-19 symptomatology, cardiopulmonary, or bleeding dyscrasias.  Constitutional: No fever, significant weight change, fatigue  Eyes: No redness, discharge, pain, vision change ENT/mouth: No nasal congestion,  purulent discharge, earache, change in hearing, sore throat  Cardiovascular: No chest pain, palpitations, paroxysmal nocturnal dyspnea, claudication, edema  Respiratory: No cough, sputum production, hemoptysis, DOE, significant snoring, apnea   Gastrointestinal: No heartburn, dysphagia, abdominal pain, nausea /vomiting, rectal bleeding, melena, change in bowels Genitourinary: No dysuria, hematuria, pyuria, incontinence, nocturia  Musculoskeletal: No joint stiffness, joint swelling, weakness, pain Dermatologic: No rash, pruritus, change in appearance of skin Neurologic: No dizziness, headache, syncope, seizures, numbness, tingling Psychiatric: No significant anxiety, depression, insomnia, anorexia Endocrine: No change in hair/skin/nails, excessive thirst, excessive hunger, excessive urination  Hematologic/lymphatic: No significant bruising, lymphadenopathy, abnormal bleeding Allergy/immunology: No itchy/watery eyes, significant sneezing, urticaria, angioedema  Physical exam:  Pertinent or positive findings: She is interactive and polite although lacks orientation to date.  She has an upper dental plate.  There is suggestion of a faint basilar murmur.  Despite history of stroke she is extremely strong to opposition.  General appearance: Adequately nourished; no acute distress, increased work of breathing is present.   Lymphatic: No lymphadenopathy about the head, neck, axilla. Eyes: No conjunctival inflammation or lid edema is present. There is no scleral icterus. Ears:  External ear exam shows no significant lesions or deformities.   Nose:  External nasal examination shows no deformity or inflammation. Nasal mucosa are pink and moist without lesions, exudates Oral exam:  Lips and gums are healthy appearing. There is no oropharyngeal erythema or exudate. Neck:  No thyromegaly, masses, tenderness noted.    Heart:  Normal rate and regular rhythm. S1 and S2 normal without gallop, click, rub .  Lungs: Chest clear to auscultation without wheezes, rhonchi, rales, rubs. Abdomen: Bowel sounds are normal. Abdomen is soft and nontender with no organomegaly, hernias, masses. GU: Deferred  Extremities:  No cyanosis, clubbing, edema  Neurologic exam : Balance, Rhomberg, finger to nose testing could not be completed due to clinical state Skin: Warm & dry w/o tenting. No significant lesions or rash.  See summary under each active  problem in the Problem List with associated updated therapeutic plan

## 2018-10-11 NOTE — Assessment & Plan Note (Addendum)
Lifelong warfarin therapy.  [facility] NP is managing the PT/INRs.

## 2018-10-12 ENCOUNTER — Encounter: Payer: Self-pay | Admitting: Internal Medicine

## 2018-10-12 NOTE — Assessment & Plan Note (Signed)
Neurology follow-up cannot be completed as the patient refuses to be transported for subspecialty appointments.  No change in psychotropic drugs as she is exhibiting no behavioral issues beyond this noncompliance.

## 2018-10-17 ENCOUNTER — Encounter: Payer: Self-pay | Admitting: Internal Medicine

## 2018-10-17 DIAGNOSIS — N390 Urinary tract infection, site not specified: Secondary | ICD-10-CM | POA: Insufficient documentation

## 2018-10-17 DIAGNOSIS — B9620 Unspecified Escherichia coli [E. coli] as the cause of diseases classified elsewhere: Secondary | ICD-10-CM | POA: Insufficient documentation

## 2018-10-29 LAB — POCT INR: INR: 2.1 — AB (ref 0.9–1.1)

## 2018-12-22 IMAGING — CT CT ANGIO HEAD
1 of 13 series · 5 of 33 positions shown · IV contrast (isovue)
Comparison: Brain MRI 06/28/2015.  CTA head neck 06/28/2015

CLINICAL DATA: Stroke workup.

EXAM:
CT ANGIOGRAPHY HEAD AND NECK
TECHNIQUE: Multidetector CT imaging of the head and neck was performed using
the standard protocol during bolus administration of intravenous
contrast. Multiplanar CT image reconstructions and MIPs were
obtained to evaluate the vascular anatomy. Carotid stenosis
measurements (when applicable) are obtained utilizing NASCET
criteria, using the distal internal carotid diameter as the
denominator.
CONTRAST:  50 cc Isovue 370 intravenous

[Series 13: cta neck axial · axial · 0.39mm/px · z∈[-246,-10]mm · 5 of 355 slices shown]
[im 60/355  soft-tissue]
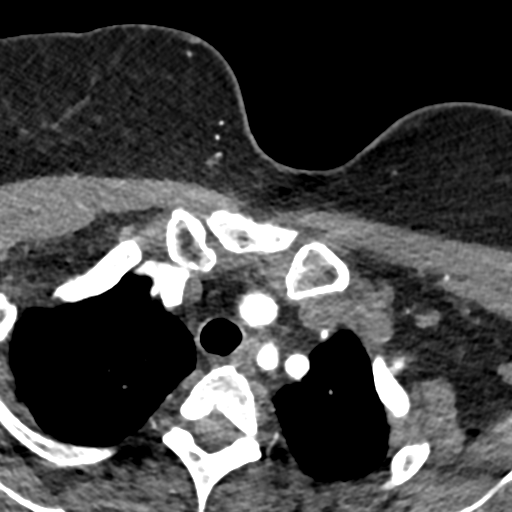
[im 119/355  bone]
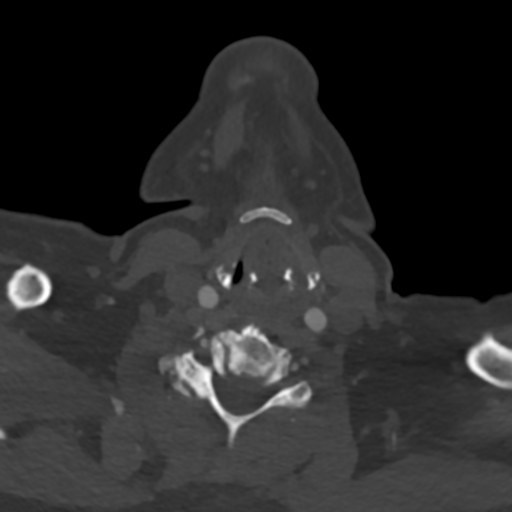
[im 178/355  soft-tissue]
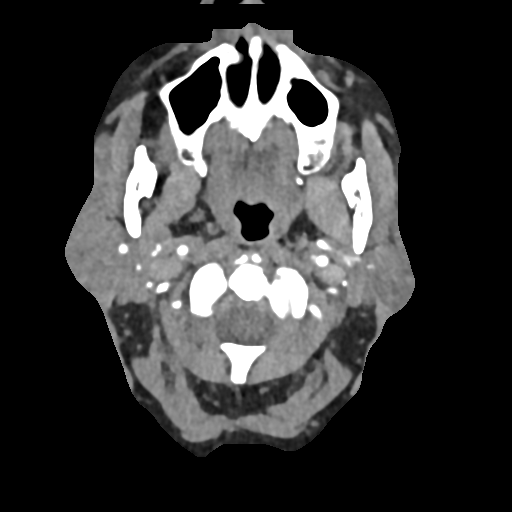
[im 237/355  bone]
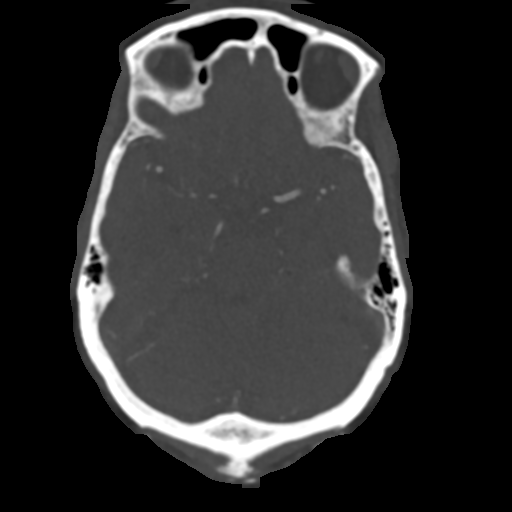
[im 296/355  soft-tissue]
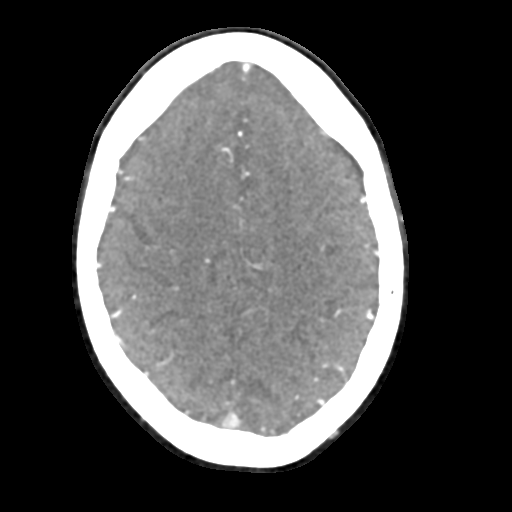

[5 of 33 positions shown; findings below may reference images not displayed]

FINDINGS: CT HEAD FINDINGS

Brain: Remote left PCA territory infarct, new from June 2015. No
visible acute infarct, hemorrhage, hydrocephalus, or mass. Remote
small vessel infarcts in the right cerebellum.

Vascular: See below

Skull: No acute or aggressive finding

Sinuses: Negative

Orbits: Negative

Review of the MIP images confirms the above findings

CTA NECK FINDINGS

Aortic arch: Atherosclerosis of the arch. Two vessel branching
pattern

Right carotid system: Moderate plaque of the proximal
brachiocephalic artery. Mild calcified plaque on the carotid bulb.
No focal stenosis or ulceration. Tortuosity with two kinks in the
mid to distal ICA

Left carotid system: No noted atheromatous changes. No stenosis or
ulceration. Tortuosity with retropharyngeal course.

Vertebral arteries: No proximal subclavian stenosis. Codominant
vertebral arteries. The vertebral arteries are smooth and widely
patent.

Skeleton: Diffuse facet arthropathy and moderate disc degeneration.
No acute or aggressive finding.

Other neck: No incidental mass or inflammation.

Upper chest: Centrilobular emphysema

Review of the MIP images confirms the above findings

CTA HEAD FINDINGS

Anterior circulation: Atherosclerosis on the carotid siphons.
Moderate left mid cavernous segment stenosis, stable. Hypoplastic
right A1 segment. No major branch occlusion or reversible proximal
flow limiting stenosis. Negative for aneurysm.

Posterior circulation: Smooth and widely patent vertebral and
basilar arteries. No unusual branching. Left P2/P3 segment occlusion
correlating with the infarct, with faint downstream flow. Right
superior cerebellar artery flow is improved. Negative for aneurysm.

Venous sinuses: Patent as permitted by contrast timing

Anatomic variants: None significant

Delayed phase: No abnormal intracranial enhancement

Review of the MIP images confirms the above findings
IMPRESSION: 1. Left P [DATE] segment occlusion correlating with the interval left
occipital infarct.
2. Stable anterior intracranial circulation including moderate left
cavernous stenosis.
3. Mild atherosclerosis in the neck without stenosis. Bilateral ICA
tortuosity.
4. Aortic Atherosclerosis (E03HG-X36.6) and Emphysema (E03HG-SUT.6).

## 2018-12-30 ENCOUNTER — Non-Acute Institutional Stay (SKILLED_NURSING_FACILITY): Payer: Medicare Other | Admitting: Internal Medicine

## 2018-12-30 ENCOUNTER — Encounter: Payer: Self-pay | Admitting: Internal Medicine

## 2018-12-30 DIAGNOSIS — I10 Essential (primary) hypertension: Secondary | ICD-10-CM

## 2018-12-30 DIAGNOSIS — I358 Other nonrheumatic aortic valve disorders: Secondary | ICD-10-CM

## 2018-12-30 DIAGNOSIS — E1149 Type 2 diabetes mellitus with other diabetic neurological complication: Secondary | ICD-10-CM

## 2018-12-30 DIAGNOSIS — F01518 Vascular dementia, unspecified severity, with other behavioral disturbance: Secondary | ICD-10-CM

## 2018-12-30 DIAGNOSIS — F0151 Vascular dementia with behavioral disturbance: Secondary | ICD-10-CM | POA: Diagnosis not present

## 2018-12-30 NOTE — Assessment & Plan Note (Addendum)
The novel oral anticoagulants are not an option as this is a valvular lesion and requires lifelong warfarin.  [facility] NP is managing PT/INRs. No bleeding dyscrasias reported by the patient but her history is unreliable due to dementia.

## 2018-12-30 NOTE — Progress Notes (Signed)
NURSING HOME LOCATION:  [facility] ROOM NUMBER:  110-A  CODE STATUS:  FULL CODE  PCP:  Pecola Lawless, MD  [address] [place] [address]  This is a nursing facility follow up of chronic medical diagnoses.   Interim medical record and care since last [facility] visit was updated with review of diagnostic studies and change in clinical status since last visit were documented.  HPI: She is a permanent resident facility with a history of cerebellar stroke in the context of an aortic valve excrescence for which she is on lifelong warfarin.  [facility] NP manages her PT/INR's.  Other diagnoses include essential hypertension, COPD, history of idiopathic acute pancreatitis, diabetes complicated by retinopathy, hypothyroidism, dyslipidemia, vascular dementia, and history of depression.  Her neurocognitive deficits are associated with behavioral issues.  Chiefly this is noncompliance with screening procedures and subspecialty follow-up.  She will agree to appointments with her neurologist and for procedures such as mammography; but she will refuse to be transported the morning of the scheduled visits. Labs are not current due to the pandemic.  Her last A1c was 7.1% in March of this year.  Fasting glucoses have ranged from a low of 108 up to high 125 indicating excellent control.  There has been no report of any hypoglycemia. TSH was therapeutic in February of this year but has not been updated.  Review of systems: Dementia invalidated responses.  It was close to 1:00 in the afternoon and she was still in bed asleep.  She was wearing a mask because of the pandemic but could not tell me why she was wearing the mask.  Her replies were "oh gosh. in case".  She cannot give the year or the president.  Unlike last time she could not name her PCP.  She did not realize that she had seen a neurologist previously.  She stated she saw no reason to see any doctors for medical reasons as  she has no active symptoms whatsoever. Specifically no bleeding dyscrasias on warfarin therapy.  She also volunteered no symptoms or signs I had not mentioned.  Constitutional: No fever, significant weight change, fatigue  Eyes: No redness, discharge, pain, vision change ENT/mouth: No nasal congestion,  purulent discharge, earache, change in hearing, sore throat  Cardiovascular: No chest pain, palpitations, paroxysmal nocturnal dyspnea, claudication, edema  Respiratory: No cough, sputum production, hemoptysis, DOE, significant snoring, apnea   Gastrointestinal: No heartburn, dysphagia, abdominal pain, nausea /vomiting, rectal bleeding, melena, change in bowels Genitourinary: No dysuria, hematuria, pyuria, incontinence, nocturia Musculoskeletal: No joint stiffness, joint swelling, weakness, pain Dermatologic: No rash, pruritus, change in appearance of skin Neurologic: No dizziness, headache, syncope, seizures, numbness, tingling Psychiatric: No significant anxiety, depression, insomnia, anorexia Endocrine: No change in hair/skin/nails, excessive thirst, excessive hunger, excessive urination  Hematologic/lymphatic: No significant bruising, lymphadenopathy, abnormal bleeding Allergy/immunology: No itchy/watery eyes, significant sneezing, urticaria, angioedema  Physical exam:  Pertinent or positive findings: She has an upper plate.  The nasolabial folds are slightly asymmetric, the left is slightly decreased.  She exhibits a bradycardia.  Heart rhythm is slightly irregular.  Dorsalis pedis pulses are stronger than posterior tibial pulses.  She has clubbing of the nailbeds.  General appearance: Adequately nourished; no acute distress, increased work of breathing is present.   Lymphatic: No lymphadenopathy about the head, neck, axilla. Eyes: No conjunctival inflammation or lid edema is present. There is no scleral icterus. Ears:  External ear exam shows no significant lesions or deformities.  Nose:  External nasal examination shows no deformity or inflammation. Nasal mucosa are pink and moist without lesions, exudates Oral exam:  Lips and gums are healthy appearing. There is no oropharyngeal erythema or exudate. Neck:  No thyromegaly, masses, tenderness noted.    Heart:  No gallop, murmur, click, rub .  Lungs: Chest clear to auscultation without wheezes, rhonchi, rales, rubs. Abdomen: Bowel sounds are normal. Abdomen is soft and nontender with no organomegaly, hernias, masses. GU: Deferred  Extremities:  No cyanosis, clubbing, edema  Neurologic exam : Strength equal  in upper & lower extremities Skin: Warm & dry w/o tenting. No significant lesions or rash.  See summary under each active problem in the Problem List with associated updated therapeutic plan

## 2018-12-30 NOTE — Patient Instructions (Signed)
See assessment and plan under each diagnosis in the problem list and acutely for this visit 

## 2018-12-30 NOTE — Assessment & Plan Note (Signed)
Random glucoses indicate adequate diabetic control.

## 2018-12-30 NOTE — Assessment & Plan Note (Addendum)
Neurology follow-up cannot be completed due to patient's noncompliance.  It is unlikely that the generic Namenda and Aricept are of value; but stopping these could lead to more significant behavioral issues.

## 2018-12-30 NOTE — Assessment & Plan Note (Signed)
Blood pressure is actually slightly low.  She is essentially sedentary.  Amlodipine will be decreased to 5 mg daily.

## 2019-03-29 ENCOUNTER — Non-Acute Institutional Stay (SKILLED_NURSING_FACILITY): Payer: Medicare Other | Admitting: Internal Medicine

## 2019-03-29 ENCOUNTER — Encounter: Payer: Self-pay | Admitting: Internal Medicine

## 2019-03-29 DIAGNOSIS — I5189 Other ill-defined heart diseases: Secondary | ICD-10-CM

## 2019-03-29 DIAGNOSIS — F0151 Vascular dementia with behavioral disturbance: Secondary | ICD-10-CM | POA: Diagnosis not present

## 2019-03-29 DIAGNOSIS — I519 Heart disease, unspecified: Secondary | ICD-10-CM | POA: Diagnosis not present

## 2019-03-29 DIAGNOSIS — E1149 Type 2 diabetes mellitus with other diabetic neurological complication: Secondary | ICD-10-CM | POA: Diagnosis not present

## 2019-03-29 DIAGNOSIS — F01518 Vascular dementia, unspecified severity, with other behavioral disturbance: Secondary | ICD-10-CM

## 2019-03-29 DIAGNOSIS — D696 Thrombocytopenia, unspecified: Secondary | ICD-10-CM

## 2019-03-29 NOTE — Assessment & Plan Note (Addendum)
Unfortunately the impasse as to neurology follow-up remains due to her dementia and noncompliance.  Clinically I question whether the Aricept and Namenda are of benefit.

## 2019-03-29 NOTE — Assessment & Plan Note (Signed)
No clinical evidence of heart failure

## 2019-03-29 NOTE — Assessment & Plan Note (Addendum)
Fasting blood sugars range from 104-129 indicating excellent control. Last A1c in Epic was 7.1% on 03/30/2018.  [facility] NP will update

## 2019-03-29 NOTE — Assessment & Plan Note (Addendum)
Despite warfarin therapy and history of thrombocytopenia; no bleeding dyscrasias reported.  Last platelet count on record was 03/26/2018 with a value of 168,000. Lab update will be discussed with the [facility] NP.

## 2019-03-29 NOTE — Progress Notes (Signed)
   NURSING HOME LOCATION:  [facility] ROOM NUMBER:  110/A  CODE STATUS:  Full Code  PCP: Pecola Lawless MD.   This is a nursing facility follow up of chronic medical diagnoses  Interim medical record and care since last [facility] visit was updated with review of diagnostic studies and change in clinical status since last visit were documented.  HPI: She is a permanent resident of the facility with diagnoses  past history of pancreatitis, hypothyroidism, essential hypertension, dyslipidemia, major depressive disorder, diabetes with neurologic complications, dementia, COPD, history of thrombocytopenia, and  prior embolic occipital stroke in context of valvular heart disease with lifelong warfarin.  Review of systems: Dementia invalidated responses. Date given as Monday, January 10.  She cannot provide the year.  When I asked her why she was still in bed past 4:30 in the afternoon; she stated that she was "watching the news".  There was a lively discussion about the vaccines for COVID-19 being broadcast but she could not tell me what the news content was.  When I pressed her she began to look at her nails without answering.  She denies any active complaints.  Physical exam:  Pertinent or positive findings: Hair is thick & densely braided.  No neck vein distention present.  Chest is surprisingly clear.  First and second heart sounds are loud.  Abdomen is massive with a dramatically accentuated panniculus. No HJR present. Pedal pulses are strong.  There is some fusiform changes to the knees. Strength opposition is good.  General appearance: Adequately nourished; no acute distress, increased work of breathing is present.   Lymphatic: No lymphadenopathy about the head, neck, axilla. Eyes: No conjunctival inflammation or lid edema is present. There is no scleral icterus. Ears:  External ear exam shows no significant lesions or deformities.   Nose:  External nasal examination shows  no deformity or inflammation. Nasal mucosa are pink and moist without lesions, exudates Oral exam:  Lips and gums are healthy appearing. There is no oropharyngeal erythema or exudate. Neck:  No thyromegaly, masses, tenderness noted.    Heart:  Normal rate and regular rhythm without gallop, murmur, click, rub .  Lungs: without wheezes, rhonchi, rales, rubs. Abdomen: Bowel sounds are normal. Abdomen is soft and nontender with no organomegaly, hernias, masses. GU: Deferred  Extremities:  No cyanosis, clubbing, edema  Neurologic exam : Balance, Rhomberg, finger to nose testing could not be completed due to clinical state Skin: Warm & dry w/o tenting. No significant lesions or rash.  See summary under each active problem in the Problem List with associated updated therapeutic plan

## 2019-03-30 NOTE — Patient Instructions (Signed)
See assessment and plan under each diagnosis in the problem list and acutely for this visit 

## 2019-03-31 ENCOUNTER — Encounter: Payer: Self-pay | Admitting: Internal Medicine

## 2019-03-31 NOTE — Progress Notes (Signed)
This encounter was created in error - please disregard.

## 2019-04-01 LAB — LIPID PANEL
Cholesterol: 127 (ref 0–200)
HDL: 47 (ref 35–70)
LDL Cholesterol: 57
LDl/HDL Ratio: 2.7
Triglycerides: 114 (ref 40–160)

## 2019-04-01 LAB — BASIC METABOLIC PANEL
BUN: 22 — AB (ref 4–21)
CO2: 20 (ref 13–22)
Chloride: 108 (ref 99–108)
Creatinine: 1.2 — AB (ref 0.5–1.1)
Glucose: 178
Potassium: 3.5 (ref 3.4–5.3)
Sodium: 142 (ref 137–147)

## 2019-04-01 LAB — HEMOGLOBIN A1C: Hemoglobin A1C: 6.8

## 2019-04-01 LAB — HEPATIC FUNCTION PANEL
ALT: 8 (ref 7–35)
AST: 14 (ref 13–35)
Alkaline Phosphatase: 73 (ref 25–125)
Bilirubin, Total: 0.5

## 2019-04-01 LAB — CBC AND DIFFERENTIAL
HCT: 44 (ref 36–46)
Hemoglobin: 14.2 (ref 12.0–16.0)
Neutrophils Absolute: 2
Platelets: 170 (ref 150–399)
WBC: 4.9

## 2019-04-01 LAB — COMPREHENSIVE METABOLIC PANEL
Albumin: 4.2 (ref 3.5–5.0)
Calcium: 9.8 (ref 8.7–10.7)
GFR calc Af Amer: 52.8
GFR calc non Af Amer: 45.56
Globulin: 3.3

## 2019-04-01 LAB — POCT INR: INR: 1.9 — AB (ref 0.9–1.1)

## 2019-04-01 LAB — TSH: TSH: 3.17 (ref 0.41–5.90)

## 2019-04-01 LAB — CBC: RBC: 4.98 (ref 3.87–5.11)

## 2019-04-01 LAB — PROTIME-INR: Protime: 21.7 — AB (ref 10.0–13.8)

## 2019-04-01 LAB — VITAMIN D 25 HYDROXY (VIT D DEFICIENCY, FRACTURES): Vit D, 25-Hydroxy: 55.62

## 2019-06-30 ENCOUNTER — Encounter: Payer: Self-pay | Admitting: Internal Medicine

## 2019-06-30 ENCOUNTER — Non-Acute Institutional Stay (SKILLED_NURSING_FACILITY): Payer: Medicare Other | Admitting: Internal Medicine

## 2019-06-30 DIAGNOSIS — I358 Other nonrheumatic aortic valve disorders: Secondary | ICD-10-CM

## 2019-06-30 DIAGNOSIS — E034 Atrophy of thyroid (acquired): Secondary | ICD-10-CM

## 2019-06-30 DIAGNOSIS — N183 Chronic kidney disease, stage 3 unspecified: Secondary | ICD-10-CM | POA: Insufficient documentation

## 2019-06-30 DIAGNOSIS — N1831 Chronic kidney disease, stage 3a: Secondary | ICD-10-CM | POA: Diagnosis not present

## 2019-06-30 DIAGNOSIS — E1149 Type 2 diabetes mellitus with other diabetic neurological complication: Secondary | ICD-10-CM

## 2019-06-30 DIAGNOSIS — E7849 Other hyperlipidemia: Secondary | ICD-10-CM

## 2019-06-30 DIAGNOSIS — F01518 Vascular dementia, unspecified severity, with other behavioral disturbance: Secondary | ICD-10-CM

## 2019-06-30 DIAGNOSIS — F0151 Vascular dementia with behavioral disturbance: Secondary | ICD-10-CM

## 2019-06-30 NOTE — Assessment & Plan Note (Signed)
04/01/2019 creatinine 1.2, GFR 52.80 Avoid nephrotoxic medications.

## 2019-06-30 NOTE — Assessment & Plan Note (Signed)
04/01/2019 LDL 57 with goal of less than 70.  Statin will be continued because of history of CVAs.

## 2019-06-30 NOTE — Patient Instructions (Signed)
See assessment and plan under each diagnosis in the problem list and acutely for this visit 

## 2019-06-30 NOTE — Assessment & Plan Note (Addendum)
The noncompliance with preventive therapies and sbspecialty follow-up continues to be an issue.  She is nonconfrontational and spends most of her time in her room, in bed. Verify with [facility] NP as to any HCPOA. Full Code status is inappropriate with her profound dementia

## 2019-06-30 NOTE — Assessment & Plan Note (Addendum)
[facility] NP managing warfarin which is required lifelong for stroke prevention

## 2019-06-30 NOTE — Assessment & Plan Note (Addendum)
04/01/2019 A1c 6.8% indicating excellent control. Recent fasting glucoses range from 115 up to 171.No change indicated

## 2019-06-30 NOTE — Progress Notes (Signed)
   NURSING HOME LOCATION:  [facility] ROOM NUMBER:  110-A  CODE STATUS:  FULL CODE  PCP:  Pecola Lawless, MD  [address] [place] [address]   This is a nursing facility follow up of chronic medical diagnoses.   Interim medical record and care since last [facility] visit was updated with review of diagnostic studies and change in clinical status since last visit were documented.  HPI: She is a permanent resident of the facility with diagnoses of essential hypertension, dyslipidemia, history of cerebellar stroke, vascular dementia, hypothyroidism, depression, diabetes with vascular complications, COPD, history of pancreatitis, and aortic valve  lesion necessitating lifelong warfarin.  Review of systems: Dementia prevented any meaningful history being obtained.  She denied any active symptoms.  It was after 3:00 in the afternoon and she was still asleep in bed.  When I asked why, she stated she had just awakened.  Although she had a watch on each wrist she was unable to tell me what time it was.  In fact neither watch was even working,a fact that she did not grasp.   Physical exam:  Pertinent or positive findings: She is obese.  Facies are blank, but she will occasionally smile.  Eyebrows are decreased laterally.  The left nasolabial fold is decreased slightly.  Heart sounds are slightly accentuated.  Trace edema is noted at the sock line.  Clubbing of the nailbeds is suggested.  The second right finger has slight flexion contraction change.    General appearance: Adequately nourished; no acute distress, increased work of breathing is present.   Lymphatic: No lymphadenopathy about the head, neck, axilla. Eyes: No conjunctival inflammation or lid edema is present. There is no scleral icterus. Ears:  External ear exam shows no significant lesions or deformities.   Nose:  External nasal examination shows no deformity or inflammation. Nasal mucosa are pink and  moist without lesions, exudates Oral exam:  Lips and gums are healthy appearing. There is no oropharyngeal erythema or exudate. Neck:  No thyromegaly, masses, tenderness noted.    Heart:  No gallop, murmur, click, rub .  Lungs: Chest clear to auscultation without wheezes, rhonchi, rales, rubs. Abdomen: Bowel sounds are normal. Abdomen is soft and nontender with no organomegaly, hernias, masses. GU: Deferred  Extremities:  No cyanosis, clubbing Neurologic exam : Strength equal  in upper & lower extremities Balance, Rhomberg, finger to nose testing could not be completed due to clinical state Skin: Warm & dry w/o tenting. No significant lesions or rash.  See summary under each active problem in the Problem List with associated updated therapeutic plan

## 2019-06-30 NOTE — Assessment & Plan Note (Signed)
04/01/2019 TSH therapeutic at 3.17.  No change in L-thyroxine dose.

## 2019-08-03 LAB — BASIC METABOLIC PANEL
BUN: 40 — AB (ref 4–21)
CO2: 22 (ref 13–22)
Chloride: 99 (ref 99–108)
Creatinine: 0.9 (ref 0.5–1.1)
Glucose: 127
Potassium: 4.1 (ref 3.4–5.3)
Sodium: 136 — AB (ref 137–147)

## 2019-08-03 LAB — CBC: RBC: 4.74 (ref 3.87–5.11)

## 2019-08-03 LAB — CBC AND DIFFERENTIAL
HCT: 41 (ref 36–46)
Hemoglobin: 13.5 (ref 12.0–16.0)
Neutrophils Absolute: 4
Platelets: 171 (ref 150–399)
WBC: 6.6

## 2019-08-03 LAB — COMPREHENSIVE METABOLIC PANEL
Calcium: 10.2 (ref 8.7–10.7)
GFR calc Af Amer: 76.09
GFR calc non Af Amer: 65.65

## 2019-08-10 LAB — BASIC METABOLIC PANEL
BUN: 45 — AB (ref 4–21)
CO2: 26 — AB (ref 13–22)
Chloride: 96 — AB (ref 99–108)
Creatinine: 1.3 — AB (ref 0.5–1.1)
Glucose: 170
Potassium: 3.3 — AB (ref 3.4–5.3)
Sodium: 138 (ref 137–147)

## 2019-08-10 LAB — COMPREHENSIVE METABOLIC PANEL
Calcium: 10 (ref 8.7–10.7)
GFR calc Af Amer: 47.88
GFR calc non Af Amer: 41.31

## 2019-09-29 ENCOUNTER — Non-Acute Institutional Stay (SKILLED_NURSING_FACILITY): Payer: Medicare Other | Admitting: Internal Medicine

## 2019-09-29 ENCOUNTER — Encounter: Payer: Self-pay | Admitting: Internal Medicine

## 2019-09-29 DIAGNOSIS — I358 Other nonrheumatic aortic valve disorders: Secondary | ICD-10-CM

## 2019-09-29 DIAGNOSIS — G300 Alzheimer's disease with early onset: Secondary | ICD-10-CM | POA: Diagnosis not present

## 2019-09-29 DIAGNOSIS — E1149 Type 2 diabetes mellitus with other diabetic neurological complication: Secondary | ICD-10-CM

## 2019-09-29 DIAGNOSIS — F0281 Dementia in other diseases classified elsewhere with behavioral disturbance: Secondary | ICD-10-CM

## 2019-09-29 DIAGNOSIS — F02818 Dementia in other diseases classified elsewhere, unspecified severity, with other behavioral disturbance: Secondary | ICD-10-CM

## 2019-09-29 NOTE — Assessment & Plan Note (Signed)
Lifelong warfarin mandatory.  PT/INR managed by [facility] NP.

## 2019-09-29 NOTE — Assessment & Plan Note (Signed)
Most recent A1c was 6.8% in March 2021.  Recent fasting blood sugars range from 131 up to 182, indicating adequate control. [facility] NP will update A1c.

## 2019-09-29 NOTE — Progress Notes (Signed)
   NURSING HOME LOCATION:  [facility] ROOM NUMBER:  110-A  CODE STATUS:  FULL CODE  PCP:  Pecola Lawless, MD  [address] [place] [address]  This is a nursing facility follow up of chronic medical diagnoses.   Interim medical record and care since last [facility] visit was updated with review of diagnostic studies and change in clinical status since last visit were documented.  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 Last A1c recorded is 6.8% in March of this year;fasting glucoses here at the facility have ranged from 131 up to 182, indicating adequate control.  Review of systems: Dementia invalidated responses.  She was unable to give me the date, even the year.  She was unable to name the president or actually any president.  She was also unable to name her neurologist.  Extensive review of systems was negative.  She had a stuffed dog on her lap.  When I asked what this was she said it is my pet dog; she stated that she had "not named him yet".  Constitutional: No fever, significant weight change, fatigue  Eyes: No redness, discharge, pain, vision change ENT/mouth: No nasal congestion,  purulent discharge, earache, change in hearing, sore throat  Cardiovascular: No chest pain, palpitations, paroxysmal nocturnal dyspnea, claudication, edema  Respiratory: No cough, sputum production, hemoptysis, significant snoring, apnea   Gastrointestinal: No heartburn, dysphagia, abdominal pain, nausea /vomiting, rectal bleeding, melena, change in bowels Genitourinary: No dysuria, hematuria, pyuria, incontinence, nocturia Musculoskeletal: No joint stiffness, joint swelling, weakness, pain Dermatologic: No rash, pruritus, change in appearance of  skin Neurologic: No dizziness, headache, syncope, seizures, numbness, tingling Psychiatric: No significant anxiety, depression, insomnia, anorexia Endocrine: No change in hair/skin/nails, excessive thirst, excessive hunger, excessive urination  Hematologic/lymphatic: No significant bruising, lymphadenopathy, abnormal bleeding Allergy/immunology: No itchy/watery eyes, significant sneezing, urticaria, angioedema  Physical exam:  Pertinent or positive findings: She is very pleasant and does not get frustrated when she is unable to answer my queries.  She has an upper plate.  Heart rate is slow.  There is no significant murmur despite cardiac history.  Dorsalis pedis pulses are stronger than the posterior tibial pulses.  General appearance: Adequately nourished; no acute distress, increased work of breathing is present.   Lymphatic: No lymphadenopathy about the head, neck, axilla. Eyes: No conjunctival inflammation or lid edema is present. There is no scleral icterus. Ears:  External ear exam shows no significant lesions or deformities.   Nose:  External nasal examination shows no deformity or inflammation. Nasal mucosa are pink and moist without lesions, exudates Oral exam:  Lips and gums are healthy appearing. There is no oropharyngeal erythema or exudate. Neck:  No thyromegaly, masses, tenderness noted.    Heart:  No gallop, murmur, click, rub .  Lungs: Chest clear to auscultation without wheezes, rhonchi, rales, rubs. Abdomen: Bowel sounds are normal. Abdomen is soft and nontender with no organomegaly, hernias, masses. GU: Deferred  Extremities:  No cyanosis, clubbing, edema  Neurologic exam :Balance, Rhomberg, finger to nose testing could not be completed due to clinical state Skin: Warm & dry w/o tenting. No significant lesions or rash.  See summary under each active problem in the Problem List with associated updated therapeutic plan

## 2019-09-29 NOTE — Assessment & Plan Note (Addendum)
The persistent behavior disorder consists of agreeing to follow-up assessments such as Neurology and mammograms; but then she will refuse to be transported the day of the appointments. Neurology has declined to make additional appointments after she failed to show for 2 prior visits. Code status should be revisited @ care plan meeting.

## 2019-09-29 NOTE — Patient Instructions (Signed)
See assessment and plan under each diagnosis in the problem list and acutely for this visit 

## 2019-10-21 LAB — COMPREHENSIVE METABOLIC PANEL
Calcium: 10 (ref 8.7–10.7)
GFR calc Af Amer: 53.65
GFR calc non Af Amer: 46.29

## 2019-10-21 LAB — BASIC METABOLIC PANEL
BUN: 34 — AB (ref 4–21)
CO2: 19 (ref 13–22)
Chloride: 104 (ref 99–108)
Creatinine: 1.2 — AB (ref 0.5–1.1)
Glucose: 150
Potassium: 4.1 (ref 3.4–5.3)
Sodium: 138 (ref 137–147)

## 2019-10-21 LAB — CBC AND DIFFERENTIAL
HCT: 43 (ref 36–46)
Hemoglobin: 13.7 (ref 12.0–16.0)
Neutrophils Absolute: 2.7
Platelets: 165 (ref 150–399)
WBC: 5.1

## 2019-10-21 LAB — CBC: RBC: 4.94 (ref 3.87–5.11)

## 2020-01-03 ENCOUNTER — Encounter: Payer: Self-pay | Admitting: Internal Medicine

## 2020-01-03 ENCOUNTER — Non-Acute Institutional Stay (SKILLED_NURSING_FACILITY): Payer: Medicare Other | Admitting: Internal Medicine

## 2020-01-03 DIAGNOSIS — F0151 Vascular dementia with behavioral disturbance: Secondary | ICD-10-CM

## 2020-01-03 DIAGNOSIS — N1831 Chronic kidney disease, stage 3a: Secondary | ICD-10-CM | POA: Diagnosis not present

## 2020-01-03 DIAGNOSIS — E440 Moderate protein-calorie malnutrition: Secondary | ICD-10-CM

## 2020-01-03 DIAGNOSIS — F321 Major depressive disorder, single episode, moderate: Secondary | ICD-10-CM | POA: Insufficient documentation

## 2020-01-03 DIAGNOSIS — I10 Essential (primary) hypertension: Secondary | ICD-10-CM | POA: Diagnosis not present

## 2020-01-03 DIAGNOSIS — I639 Cerebral infarction, unspecified: Secondary | ICD-10-CM

## 2020-01-03 DIAGNOSIS — I358 Other nonrheumatic aortic valve disorders: Secondary | ICD-10-CM | POA: Diagnosis not present

## 2020-01-03 DIAGNOSIS — F01518 Vascular dementia, unspecified severity, with other behavioral disturbance: Secondary | ICD-10-CM

## 2020-01-03 NOTE — Assessment & Plan Note (Addendum)
She has profound dementia; all responses to queries  are "no" as she smiles @ the examiner. She does interact and clinically does not exhibit major depression at this time.

## 2020-01-03 NOTE — Assessment & Plan Note (Addendum)
[facility] NP is managing PT INRs. There has not been significant variance.

## 2020-01-03 NOTE — Assessment & Plan Note (Addendum)
No behavioral issues noted.  She remains profoundly demented unable to provide any meaningful history.

## 2020-01-03 NOTE — Progress Notes (Signed)
   NURSING HOME LOCATION:  [facility] ROOM NUMBER:  110-A  CODE STATUS:  FULL CODE  PCP:  Pecola Lawless, MD  [address] [place] [address]  This is a nursing facility follow up of chronic medical diagnoses.  Interim medical record and care since last [facility] visit was updated with review of diagnostic studies and change in clinical status since last visit were documented.  HPI: She is a permanent resident of facility with multiple comorbidities including diagnoses of history of cerebellar stroke, Lambl's excrescence of aortic valve, essential hypertension, history of pancreatitis, dyslipidemia, diabetes with neurologic manifestations, CKD, and vascular dementia.  Review of systems: Dementia invalidated responses.  She denies any active symptoms at this time.  A complete review of systems was attempted however.  Constitutional: No fever, significant weight change, fatigue  Eyes: No redness, discharge, pain, vision change ENT/mouth: No nasal congestion,  purulent discharge, earache, change in hearing, sore throat  Cardiovascular: No chest pain, palpitations, paroxysmal nocturnal dyspnea, claudication, edema  Respiratory: No cough, sputum production, hemoptysis, DOE, significant snoring, apnea   Gastrointestinal: No heartburn, dysphagia, abdominal pain, nausea /vomiting, rectal bleeding, melena, change in bowels Genitourinary: No dysuria, hematuria, pyuria, incontinence, nocturia Musculoskeletal: No joint stiffness, joint swelling, weakness, pain Dermatologic: No rash, pruritus, change in appearance of skin Neurologic: No dizziness, headache, syncope, seizures, numbness, tingling Psychiatric: No significant anxiety, depression, insomnia, anorexia Endocrine: No change in hair/skin/nails, excessive thirst, excessive hunger, excessive urination  Hematologic/lymphatic: No significant bruising, lymphadenopathy, abnormal bleeding Allergy/immunology: No  itchy/watery eyes, significant sneezing, urticaria, angioedema  Physical exam:  Pertinent or positive findings: She was in bed at the time of the exam.  Under the bed covers she had a stuffed animal and her plastic knife and fork from breakfast in her hands over her chest.  She rouses easily but has obvious dementia.  The left nasolabial fold is slightly decreased.  She has an upper plate.  The lower teeth are coated.  Second heart sound is increased.  The right upper extremity is clinically slightly stronger than the left.  The lower extremities appear to have equal strength.  Dorsalis pedis pulses are stronger than posterior tibial pulses.  There is clubbing the nailbeds.  General appearance: Adequately nourished; no acute distress, increased work of breathing is present.   Lymphatic: No lymphadenopathy about the head, neck, axilla. Eyes: No conjunctival inflammation or lid edema is present. There is no scleral icterus. Ears:  External ear exam shows no significant lesions or deformities.   Nose:  External nasal examination shows no deformity or inflammation. Nasal mucosa are pink and moist without lesions, exudates Oral exam:  Lips and gums are healthy appearing. There is no oropharyngeal erythema or exudate. Neck:  No thyromegaly, masses, tenderness noted.    Heart:  Normal rate and regular rhythm. S1 normal without gallop, murmur, click, rub .  Lungs: Chest clear to auscultation without wheezes, rhonchi, rales, rubs. Abdomen: Bowel sounds are normal. Abdomen is soft and nontender with no organomegaly, hernias, masses. GU: Deferred  Extremities:  No cyanosis, clubbing, edema  Neurologic exam :Balance, Rhomberg, finger to nose testing could not be completed due to clinical state Skin: Warm & dry w/o tenting. No significant lesions or rash.  See summary under each active problem in the Problem List with associated updated therapeutic plan

## 2020-01-03 NOTE — Assessment & Plan Note (Signed)
Total protein and albumin are not current but deficits have been corrected.  Clinically she does not exhibit any signs of protein caloric malnutrition at this time.

## 2020-01-03 NOTE — Assessment & Plan Note (Signed)
10/21/2019 creatinine has improved to 1.2 and GFR to 53.65 indicating CKD stage IIIa. Avoid nephrotoxic drugs.

## 2020-01-03 NOTE — Assessment & Plan Note (Addendum)
She has a history of stroke; there has been no progression or recurrence.  She remains on lifelong warfarin anticoagulation as secondary prevention.

## 2020-01-03 NOTE — Patient Instructions (Signed)
See assessment and plan under each diagnosis in the problem list and acutely for this visit 

## 2020-01-03 NOTE — Assessment & Plan Note (Addendum)
BP controlled; no change in antihypertensive medications. Bradycardia present but she is not on rate limiting antihypertensives. TSH update due by 03/2020.

## 2020-02-07 ENCOUNTER — Encounter: Payer: Self-pay | Admitting: Internal Medicine

## 2020-02-07 LAB — BASIC METABOLIC PANEL
BUN: 22 — AB (ref 4–21)
CO2: 22 (ref 13–22)
Chloride: 106 (ref 99–108)
Creatinine: 0.8 (ref 0.5–1.1)
Glucose: 128
Potassium: 4 (ref 3.4–5.3)
Sodium: 138 (ref 137–147)

## 2020-02-07 LAB — TSH: TSH: 0.72 (ref 0.41–5.90)

## 2020-02-07 LAB — COMPREHENSIVE METABOLIC PANEL
Calcium: 9.4 (ref 8.7–10.7)
GFR calc Af Amer: 83.61
GFR calc non Af Amer: 72.14

## 2020-02-07 LAB — CBC AND DIFFERENTIAL
HCT: 42 (ref 36–46)
Hemoglobin: 14.1 (ref 12.0–16.0)
Platelets: 152 (ref 150–399)
WBC: 5.2

## 2020-02-07 LAB — CBC: RBC: 5.02 (ref 3.87–5.11)

## 2020-02-07 LAB — HEMOGLOBIN A1C: Hemoglobin A1C: 7.1

## 2020-05-01 ENCOUNTER — Non-Acute Institutional Stay (SKILLED_NURSING_FACILITY): Payer: Medicare Other | Admitting: Internal Medicine

## 2020-05-01 ENCOUNTER — Encounter: Payer: Self-pay | Admitting: Internal Medicine

## 2020-05-01 DIAGNOSIS — F321 Major depressive disorder, single episode, moderate: Secondary | ICD-10-CM

## 2020-05-01 DIAGNOSIS — E440 Moderate protein-calorie malnutrition: Secondary | ICD-10-CM | POA: Diagnosis not present

## 2020-05-01 DIAGNOSIS — E1149 Type 2 diabetes mellitus with other diabetic neurological complication: Secondary | ICD-10-CM | POA: Diagnosis not present

## 2020-05-01 DIAGNOSIS — D696 Thrombocytopenia, unspecified: Secondary | ICD-10-CM

## 2020-05-01 NOTE — Patient Instructions (Signed)
See assessment and plan under each diagnosis in the problem list and acutely for this visit 

## 2020-05-01 NOTE — Progress Notes (Signed)
   NURSING HOME LOCATION:  [facility] ROOM NUMBER:110/A    CODE STATUS:Full Code    PCP:  Pecola Lawless, MD  This is a nursing facility follow up visit of chronic medical diagnoses & to document compliance with Regulation 483.30 (c) in The Long Term Care Survey Manual Phase 2 which mandates caregiver visit ( visits can alternate among physician, PA or NP as per statutes) within 10 days of 30 days / 60 days/ 90 days post admission to SNF date    Interim medical record and care since last SNF visit was updated with review of diagnostic studies and change in clinical status since last visit were documented.  HPI: She is a permanent resident of the facility with medical diagnoses of Lambl's excrescence on the aortic valve, history of idiopathic acute pancreatitis, hypothyroidism, essential hypertension, diabetes with vascular complications, COPD, and history of embolic CVA. She remains on life long anticoagulation with warfarin because of the aortic valve lesion.  Review of systems: Dementia invalidated responses.ROS responses ALL negative.  Constitutional: No fever, significant weight change, fatigue  Eyes: No redness, discharge, pain, vision change ENT/mouth: No nasal congestion,  purulent discharge, earache, change in hearing, sore throat  Cardiovascular: No chest pain, palpitations, paroxysmal nocturnal dyspnea, claudication, edema  Respiratory: No cough, sputum production, hemoptysis, DOE, significant snoring, apnea   Gastrointestinal: No heartburn, dysphagia, abdominal pain, nausea /vomiting, rectal bleeding, melena, change in bowels Genitourinary: No dysuria, hematuria, pyuria, incontinence, nocturia Musculoskeletal: No joint stiffness, joint swelling, weakness, pain Dermatologic: No rash, pruritus, change in appearance of skin Neurologic: No dizziness, headache, syncope, seizures, numbness, tingling Psychiatric: No significant anxiety, depression,  insomnia, anorexia Endocrine: No change in hair/skin/nails, excessive thirst, excessive hunger, excessive urination  Hematologic/lymphatic: No significant bruising, lymphadenopathy, abnormal bleeding Allergy/immunology: No itchy/watery eyes, significant sneezing, urticaria, angioedema  Physical exam:  Pertinent or positive findings: Upper dental plate present.S1 & S2 accentuated.Abdomen protuberant. Clubbing of nail beds.  General appearance: Adequately nourished; no acute distress, increased work of breathing is present.   Lymphatic: No lymphadenopathy about the head, neck, axilla. Eyes: No conjunctival inflammation or lid edema is present. There is no scleral icterus. Ears:  External ear exam shows no significant lesions or deformities.   Nose:  External nasal examination shows no deformity or inflammation. Nasal mucosa are pink and moist without lesions, exudates Oral exam:  Lips and gums are healthy appearing. There is no oropharyngeal erythema or exudate. Neck:  No thyromegaly, masses, tenderness noted.    Heart:  Normal rate and regular rhythm without gallop, murmur, click, rub .  Lungs: Chest clear to auscultation without wheezes, rhonchi, rales, rubs. Abdomen: Bowel sounds are normal. Abdomen is soft and nontender with no organomegaly, hernias, masses. GU: Deferred  Extremities:  No cyanosis, edema  Skin: Warm & dry w/o tenting. No significant lesions or rash.  See summary under each active problem in the Problem List with associated updated therapeutic plan

## 2020-05-01 NOTE — Assessment & Plan Note (Signed)
Mood completely unchanged; she is "pleasantly demented" w/o behavioral issues.

## 2020-05-01 NOTE — Assessment & Plan Note (Signed)
02/07/2020 platelet count 152,000. No bleeding dyscrasias even with warfarin therapy.  Dx resolved @ present.

## 2020-05-01 NOTE — Assessment & Plan Note (Signed)
02/07/2020 A1c 7.1% indicating very good DM control. No change indicated.

## 2020-05-01 NOTE — Assessment & Plan Note (Signed)
03/31/2020 albumin 4.2. No clinical suggestion of malnutrition on exam. Dx resolved.

## 2020-08-24 ENCOUNTER — Non-Acute Institutional Stay (SKILLED_NURSING_FACILITY): Payer: Medicare Other | Admitting: Internal Medicine

## 2020-08-24 ENCOUNTER — Encounter: Payer: Self-pay | Admitting: Internal Medicine

## 2020-08-24 DIAGNOSIS — I358 Other nonrheumatic aortic valve disorders: Secondary | ICD-10-CM

## 2020-08-24 DIAGNOSIS — E034 Atrophy of thyroid (acquired): Secondary | ICD-10-CM

## 2020-08-24 DIAGNOSIS — F0151 Vascular dementia with behavioral disturbance: Secondary | ICD-10-CM

## 2020-08-24 DIAGNOSIS — E1169 Type 2 diabetes mellitus with other specified complication: Secondary | ICD-10-CM

## 2020-08-24 DIAGNOSIS — I10 Essential (primary) hypertension: Secondary | ICD-10-CM

## 2020-08-24 DIAGNOSIS — E785 Hyperlipidemia, unspecified: Secondary | ICD-10-CM

## 2020-08-24 DIAGNOSIS — F01518 Vascular dementia, unspecified severity, with other behavioral disturbance: Secondary | ICD-10-CM

## 2020-08-24 NOTE — Assessment & Plan Note (Signed)
05/16/20 TSH 1.05 , therapeutic

## 2020-08-24 NOTE — Assessment & Plan Note (Signed)
BP controlled; no change in antihypertensive medications  

## 2020-08-24 NOTE — Patient Instructions (Signed)
See assessment and plan under each diagnosis in the problem list and acutely for this visit 

## 2020-08-24 NOTE — Progress Notes (Signed)
   NURSING HOME LOCATION:  [facility] ROOM NUMBER:  110 A  CODE STATUS:  Full Code  PCP:  Dorena Bodo [employer] NP  This is a nursing facility follow up visit of chronic medical diagnoses  & to document compliance with Regulation 483.30 (c) in The Long Term Care Survey Manual Phase 2 which mandates caregiver visit ( visits can alternate among physician, PA or NP as per statutes) within 10 days of 30 days / 60 days/ 90 days post admission to SNF date    Interim medical record and care since last SNF visit was updated with review of diagnostic studies and change in clinical status since last visit were documented.  HPI: She is a permanent resident of the facility with medical diagnoses of COPD, essential hypertension, hypothyroidism, aortic valve lesion, history of pancreatitis, and diabetes with neurovascular complications.  She is listed as a full code; conference with her HCPOA is to be arranged to discuss change to DNR in view of her advanced severe co-morbidities. PT/INRs are being monitored by the [facility] NP.  Lifelong warfarin anticoagulation is necessary because of the aortic valve lesion.  Review of systems: Dementia invalidated responses.  She denies any active symptoms whatsoever.  Constitutional: No fever, significant weight change, fatigue  Eyes: No redness, discharge, pain, vision change ENT/mouth: No nasal congestion,  purulent discharge, earache, change in hearing, sore throat  Cardiovascular: No chest pain, palpitations, paroxysmal nocturnal dyspnea, claudication, edema  Respiratory: No cough, sputum production, hemoptysis, DOE, significant snoring, apnea   Gastrointestinal: No heartburn, dysphagia, abdominal pain, nausea /vomiting, rectal bleeding, melena, change in bowels Genitourinary: No dysuria, hematuria, pyuria, incontinence, nocturia Musculoskeletal: No joint stiffness, joint swelling, weakness, pain Dermatologic: No rash, pruritus, change in  appearance of skin Neurologic: No dizziness, headache, syncope, seizures, numbness, tingling Psychiatric: No significant anxiety, depression, insomnia, anorexia Endocrine: No change in hair/skin/nails, excessive thirst, excessive hunger, excessive urination  Hematologic/lymphatic: No significant bruising, lymphadenopathy, abnormal bleeding Allergy/immunology: No itchy/watery eyes, significant sneezing, urticaria, angioedema  Physical exam:  Pertinent or positive findings: As is always present, she lies in bed and smiles pleasantly at the interviewer denying any active symptomatology.  Complete dentures are present.  Heart rate is slow ;second heart sound is increased.  Dorsalis pedis pulses are stronger than posterior tibial pulses.  Clubbing of the nailbeds is present and striking.  Strength to opposition is excellent in all extremities.  General appearance: Adequately nourished; no acute distress, increased work of breathing is present.   Lymphatic: No lymphadenopathy about the head, neck, axilla. Eyes: No conjunctival inflammation or lid edema is present. There is no scleral icterus. Ears:  External ear exam shows no significant lesions or deformities.   Nose:  External nasal examination shows no deformity or inflammation. Nasal mucosa are pink and moist without lesions, exudates Oral exam:  Lips and gums are healthy appearing. There is no oropharyngeal erythema or exudate. Neck:  No thyromegaly, masses, tenderness noted.    Heart:  No gallop, murmur, click, rub .  Lungs: Chest clear to auscultation without wheezes, rhonchi, rales, rubs. Abdomen: Bowel sounds are normal. Abdomen is soft and nontender with no organomegaly, hernias, masses. GU: Deferred  Extremities:  No cyanosis, edema  Neurologic exam :Balance, Rhomberg, finger to nose testing could not be completed due to clinical state Skin: Warm & dry w/o tenting. No significant lesions or rash.  See summary under each active problem  in the Problem List with associated updated therapeutic plan

## 2020-08-24 NOTE — Assessment & Plan Note (Signed)
Life long warfarin prophylaxis critical

## 2020-08-24 NOTE — Assessment & Plan Note (Addendum)
Again denies any symptoms  & can provide no meaningful history.Although pleasant & non confrontational; she is non compliant with recommended preventive health care measures.

## 2020-08-24 NOTE — Assessment & Plan Note (Signed)
06/15/20  LDL 70; no change in statin indicated

## 2020-11-18 ENCOUNTER — Emergency Department (HOSPITAL_COMMUNITY): Payer: Medicare Other

## 2020-11-18 ENCOUNTER — Other Ambulatory Visit: Payer: Self-pay

## 2020-11-18 ENCOUNTER — Emergency Department (HOSPITAL_COMMUNITY)
Admission: EM | Admit: 2020-11-18 | Discharge: 2020-11-18 | Disposition: A | Discharge status: Home / Self Care | Payer: Medicare Other | Attending: Emergency Medicine | Admitting: Emergency Medicine

## 2020-11-18 DIAGNOSIS — S0012XA Contusion of left eyelid and periocular area, initial encounter: Secondary | ICD-10-CM | POA: Diagnosis present

## 2020-11-18 DIAGNOSIS — Z87891 Personal history of nicotine dependence: Secondary | ICD-10-CM | POA: Insufficient documentation

## 2020-11-18 DIAGNOSIS — Z7901 Long term (current) use of anticoagulants: Secondary | ICD-10-CM | POA: Diagnosis not present

## 2020-11-18 DIAGNOSIS — W19XXXA Unspecified fall, initial encounter: Secondary | ICD-10-CM | POA: Diagnosis not present

## 2020-11-18 DIAGNOSIS — I10 Essential (primary) hypertension: Secondary | ICD-10-CM | POA: Insufficient documentation

## 2020-11-18 DIAGNOSIS — S0083XA Contusion of other part of head, initial encounter: Secondary | ICD-10-CM

## 2020-11-18 DIAGNOSIS — E119 Type 2 diabetes mellitus without complications: Secondary | ICD-10-CM | POA: Insufficient documentation

## 2020-11-18 DIAGNOSIS — Z20822 Contact with and (suspected) exposure to covid-19: Secondary | ICD-10-CM | POA: Diagnosis not present

## 2020-11-18 DIAGNOSIS — J449 Chronic obstructive pulmonary disease, unspecified: Secondary | ICD-10-CM | POA: Insufficient documentation

## 2020-11-18 DIAGNOSIS — R519 Headache, unspecified: Secondary | ICD-10-CM | POA: Insufficient documentation

## 2020-11-18 LAB — COMPREHENSIVE METABOLIC PANEL
ALT: 16 U/L (ref 0–44)
AST: 23 U/L (ref 15–41)
Albumin: 3.7 g/dL (ref 3.5–5.0)
Alkaline Phosphatase: 73 U/L (ref 38–126)
Anion gap: 11 (ref 5–15)
BUN: 25 mg/dL — ABNORMAL HIGH (ref 8–23)
CO2: 17 mmol/L — ABNORMAL LOW (ref 22–32)
Calcium: 9.6 mg/dL (ref 8.9–10.3)
Chloride: 110 mmol/L (ref 98–111)
Creatinine, Ser: 1.04 mg/dL — ABNORMAL HIGH (ref 0.44–1.00)
GFR, Estimated: 58 mL/min — ABNORMAL LOW (ref >60)
Glucose, Bld: 132 mg/dL — ABNORMAL HIGH (ref 70–99)
Potassium: 4.2 mmol/L (ref 3.5–5.1)
Sodium: 138 mmol/L (ref 135–145)
Total Bilirubin: 0.9 mg/dL (ref 0.3–1.2)
Total Protein: 7.3 g/dL (ref 6.5–8.1)

## 2020-11-18 LAB — PROTIME-INR
INR: 2.1 — ABNORMAL HIGH (ref 0.8–1.2)
Prothrombin Time: 23.7 seconds — ABNORMAL HIGH (ref 11.4–15.2)

## 2020-11-18 LAB — RESP PANEL BY RT-PCR (FLU A&B, COVID) ARPGX2
Influenza A by PCR: NEGATIVE
Influenza B by PCR: NEGATIVE
SARS Coronavirus 2 by RT PCR: NEGATIVE

## 2020-11-18 LAB — I-STAT CHEM 8, ED
BUN: 26 mg/dL — ABNORMAL HIGH (ref 8–23)
Calcium, Ion: 1.09 mmol/L — ABNORMAL LOW (ref 1.15–1.40)
Chloride: 110 mmol/L (ref 98–111)
Creatinine, Ser: 1 mg/dL (ref 0.44–1.00)
Glucose, Bld: 133 mg/dL — ABNORMAL HIGH (ref 70–99)
HCT: 44 % (ref 36.0–46.0)
Hemoglobin: 15 g/dL (ref 12.0–15.0)
Potassium: 3.8 mmol/L (ref 3.5–5.1)
Sodium: 140 mmol/L (ref 135–145)
TCO2: 20 mmol/L — ABNORMAL LOW (ref 22–32)

## 2020-11-18 LAB — CBC
HCT: 44.2 % (ref 36.0–46.0)
Hemoglobin: 14 g/dL (ref 12.0–15.0)
MCH: 29.2 pg (ref 26.0–34.0)
MCHC: 31.7 g/dL (ref 30.0–36.0)
MCV: 92.3 fL (ref 80.0–100.0)
Platelets: 152 10*3/uL (ref 150–400)
RBC: 4.79 MIL/uL (ref 3.87–5.11)
RDW: 14.1 % (ref 11.5–15.5)
WBC: 6.8 10*3/uL (ref 4.0–10.5)
nRBC: 0 % (ref 0.0–0.2)

## 2020-11-18 LAB — CK: Total CK: 145 U/L (ref 38–234)

## 2020-11-18 LAB — ETHANOL: Alcohol, Ethyl (B): <10 mg/dL (ref <10)

## 2020-11-18 NOTE — ED Notes (Signed)
Patient transported to CT 

## 2020-11-18 NOTE — ED Notes (Signed)
Patient d/c with [facility] back to [facility]. Report called to [facility] @ 11:11.

## 2020-11-18 NOTE — Discharge Instructions (Signed)
Contact a health care provider if: °You have a fever. °The swelling or discoloration gets worse. °You develop more hematomas. °Get help right away if: °Your pain is worse or your pain is not controlled with medicine. °Your skin over the hematoma breaks or starts bleeding. °Your hematoma is in your chest or abdomen and you have weakness, shortness of breath, or a change in consciousness. °You have a hematoma on your scalp that is caused by a fall or injury, and you also have: °A headache that gets worse. °Trouble speaking or understanding speech. °Weakness. °Change in alertness or consciousness. °

## 2020-11-18 NOTE — ED Triage Notes (Signed)
Patient BIB [facility] from [facility], pt was found on floor at 0530 this morning, staff waited for daytime provider to arrive to assess patient. Unknown when patient actually fell. Arrives to department with hematoma above right eye. Alert to baseline. VSS.

## 2020-11-18 NOTE — ED Provider Notes (Signed)
[facility] EMERGENCY DEPARTMENT Provider Note   CSN: [id number] Arrival date & time: 11/18/20  0849     History Chief Complaint  Patient presents with   Marletta Lor    [name] is a 70 y.o. female.  70 year old female with history of dementia who lives at [facility] memory care who had an unwitnessed fall this morning.  Patient found by staff at 5:30 AM.  They waited for PCP evaluation who felt that the patient needed to be seen here in the emergency department.  She has obvious right brow hematoma.  She does not remember what caused her fall.  Unknown how far along she was on the ground overnight.  She has no other complaints of injury.  She is on Coumadin.  The history is provided by the nursing home and the EMS personnel.  Fall This is a new problem. Episode onset: unwitnessed fall, found by staff at 5:30 AM. Associated symptoms comments: Facial pain - hematoma to R brow. Nothing aggravates the symptoms. Nothing relieves the symptoms.      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    Chronic anticoagulation    Vertigo    AKI (acute kidney injury) (HCC) 06/28/2015   Protein-calorie malnutrition, moderate (HCC) 06/28/2015   History of cardioembolic cerebrovascular accident (CVA) 06/28/2015   Hypokalemia    Cerebellar stroke (HCC)    Emesis    Anxiety 06/27/2015   Dizziness 06/27/2015   Tobacco abuse    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) 11/03/2014   Memory deficits 07/03/2014   Trigger finger, acquired 09/13/2012   Pain in both wrists 05/12/2012   Non-proliferative diabetic retinopathy, both eyes (HCC) 05/07/2012   Colon cancer screening 12/23/2011   Unintentional weight loss 12/22/2011   Idiopathic acute pancreatitis 10/29/2011   Major depressive disorder 09/10/2011   Hypertension 09/10/2011   Chronic back pain 09/10/2011   Hyperlipidemia 09/10/2011   Type II diabetes mellitus with neurological manifestations (HCC) 09/10/2011    Past Surgical History:  Procedure Laterality Date   BACK SURGERY     CHOLECYSTECTOMY  2003   CYST REMOVAL HAND Left 12/11/2014   Procedure: LEFT RING FINGER TENDON SHEATH CYST EXCISION;  Surgeon: Mack Hook, MD;  Location: [facility];  Service: Orthopedics;  Laterality: Left;   SPINE SURGERY  2006  Dr. Trey Sailors - L3-L4, L4-L5 laminotomy and foraminotomy   TEE WITHOUT CARDIOVERSION N/A 07/03/2015   Procedure: TRANSESOPHAGEAL ECHOCARDIOGRAM (TEE);  Surgeon: Quintella Reichert, MD;  Location: [facility];  Service: Cardiovascular;  Laterality: N/A;     OB History   No obstetric history on file.     Family History  Problem Relation Age of Onset   Alcohol abuse Father    Cancer Mother     Social History   Tobacco Use   Smoking status: Former    Packs/day: 0.50    Types: Cigarettes    Start date:  07/01/2015   Smokeless tobacco: Never  Substance Use Topics   Alcohol use: No    Alcohol/week: 0.0 standard drinks   Drug use: No    Home Medications Prior to Admission medications   Medication Sig Start Date End Date Taking? Authorizing Provider  acetaminophen (TYLENOL) 325 MG tablet Take 650 mg by mouth every 6 (six) hours as needed.    [provider]  amLODipine (NORVASC) 10 MG tablet Take 10 mg by mouth daily.    [provider]  atorvastatin (LIPITOR) 20 MG tablet Take 20 mg by mouth at bedtime.     [provider]  chlorhexidine (PERIDEX) 0.12 % solution Use as directed 5 mLs in the mouth or throat every evening.    [provider]  Cholecalciferol (VITAMIN D-3) 125 MCG (5000 UT) TABS Take 1 tablet by mouth daily.     [provider]  donepezil (ARICEPT) 5 MG tablet Take 5 mg by mouth at bedtime.    [provider]  levothyroxine (SYNTHROID, LEVOTHROID) 50 MCG tablet Take 50 mcg by mouth daily before breakfast.    [provider]  memantine (NAMENDA) 10 MG tablet Take 10 mg by mouth 2 (two) times daily.     [provider]  NON FORMULARY Regular CCD (Diet)    [provider]  Phenazopyridine HCl (URISTAT PO) Take 30 mLs by mouth in the morning and at bedtime.    [provider]  senna-docusate (SENOKOT-S) 8.6-50 MG tablet Take 1 tablet by mouth at bedtime. For Constipation    [provider]  sertraline (ZOLOFT) 100 MG tablet Take 100 mg by mouth daily.     [provider]  warfarin (COUMADIN) 7.5 MG tablet Take 7.5 mg by mouth daily.    [provider]    Allergies    Patient has no known allergies.  Review of Systems   Review of Systems  Unable to perform ROS: Dementia   Physical Exam Updated Vital Signs BP (!) 150/78 (BP Location: Right Arm)   Pulse (!) 59   Temp 97.9 F (36.6 C) (Oral)   Resp 18   Ht 5\' 5"  (1.651 m)   Wt 88 kg   SpO2 99%   BMI 32.28  kg/m   Physical Exam Vitals and nursing note reviewed.  Constitutional:      General: She is not in acute distress.    Appearance: She is well-developed. She is not diaphoretic.  HENT:     Head: Normocephalic.     Comments: Hematoma over the right brow    Right Ear: External ear normal.     Left Ear: External ear normal.     Nose: Nose normal.     Mouth/Throat:     Mouth: Mucous membranes are moist.  Eyes:     General: No scleral icterus.    Conjunctiva/sclera: Conjunctivae normal.  Cardiovascular:  Rate and Rhythm: Normal rate and regular rhythm.     Heart sounds: Normal heart sounds. No murmur heard.   No friction rub. No gallop.  Pulmonary:     Effort: Pulmonary effort is normal. No respiratory distress.     Breath sounds: Normal breath sounds.  Abdominal:     General: Bowel sounds are normal. There is no distension.     Palpations: Abdomen is soft. There is no mass.     Tenderness: There is no abdominal tenderness. There is no guarding.  Musculoskeletal:     Cervical back: Normal range of motion.     Comments: Midthoracic tenderness, midline to the spine, no obvious ecchymosis  Skin:    General: Skin is warm and dry.  Neurological:     Mental Status: She is alert and oriented to person, place, and time.  Psychiatric:        Behavior: Behavior normal.    ED Results / Procedures / Treatments   Labs (all labs ordered are listed, but only abnormal results are displayed) Labs Reviewed  RESP PANEL BY RT-PCR (FLU A&B, COVID) [id number]  COMPREHENSIVE METABOLIC PANEL  CBC  ETHANOL  URINALYSIS, ROUTINE W REFLEX MICROSCOPIC  LACTIC ACID, PLASMA  PROTIME-INR  CK  I-STAT CHEM 8, ED  SAMPLE TO BLOOD BANK    EKG None  Radiology No results found.  Procedures Procedures   Medications Ordered in ED Medications - No data to display  ED Course  I have reviewed the triage vital signs and the nursing notes.  Pertinent labs & imaging results that were available  during my care of the patient were reviewed by me and considered in my medical decision making (see chart for details).    MDM Rules/Calculators/A&P                         Patient here with fall. She has been normotensive and hemodynamically stable here.  I ordered and reviewed labs that included CBC, CMP without significant abnormality, ethanol within normal limits, CK within normal limits, therapeutic INR level.  I ordered and reviewed plain films of the chest and pelvis along with the thoracic spine all of which show no acute abnormalities, CT head, C-spine and maxillofacial all negative for acute abnormalities.  Patient appears to have a hematoma from a fall.  Suggest RICE therapy.  She is able to sit up on her own and has no evidence of significant occult thoracic fracture.  Patient appears appropriate for discharge back to her skilled nursing facility. Final Clinical Impression(s) / ED Diagnoses Final diagnoses:  Fall    Rx / DC Orders ED Discharge Orders     None        Arthor Captain, PA-C 11/18/20 1054    Margarita Grizzle, MD 11/18/20 [phone]

## 2020-11-18 NOTE — Progress Notes (Signed)
   11/18/20 0846  Clinical Encounter Type  Visited With Patient not available  Visit Type Initial  Referral From Nurse  Consult/Referral To Chaplain   Chaplain responded to Level 2 trauma. Pt being treated and no support person present. No current spiritual care needs. Chaplain remains available.  This note was prepared by Paul Half, MDiv. Chaplain remains available as needed through the on-call pager: [phone].

## 2020-11-22 ENCOUNTER — Non-Acute Institutional Stay (SKILLED_NURSING_FACILITY): Payer: Medicare Other | Admitting: Internal Medicine

## 2020-11-22 DIAGNOSIS — R296 Repeated falls: Secondary | ICD-10-CM | POA: Diagnosis not present

## 2020-11-22 DIAGNOSIS — E034 Atrophy of thyroid (acquired): Secondary | ICD-10-CM | POA: Diagnosis not present

## 2020-11-22 DIAGNOSIS — N1831 Chronic kidney disease, stage 3a: Secondary | ICD-10-CM | POA: Diagnosis not present

## 2020-11-22 DIAGNOSIS — E1149 Type 2 diabetes mellitus with other diabetic neurological complication: Secondary | ICD-10-CM | POA: Diagnosis not present

## 2020-11-22 DIAGNOSIS — N182 Chronic kidney disease, stage 2 (mild): Secondary | ICD-10-CM | POA: Insufficient documentation

## 2020-11-22 DIAGNOSIS — E440 Moderate protein-calorie malnutrition: Secondary | ICD-10-CM

## 2020-11-22 DIAGNOSIS — F01518 Vascular dementia, unspecified severity, with other behavioral disturbance: Secondary | ICD-10-CM

## 2020-11-22 NOTE — Progress Notes (Signed)
   NURSING HOME LOCATION:  [facility] ROOM NUMBER:  110 A  CODE STATUS:  Full Code   PCP:  Douglass Rivers MD  This is a nursing facility follow up visit of chronic medical diagnoses & to document compliance with Regulation 483.30 (c) in The Long Term Care Survey Manual Phase 2 which mandates caregiver visit ( visits can alternate among physician, PA or NP as per statutes) within 10 days of 30 days / 60 days/ 90 days post admission to SNF date    Interim medical record and care since last SNF visit was updated with review of diagnostic studies and change in clinical status since last visit were documented.  HPI: She is a permanent resident of facility with medical diagnoses of essential hypertension, history of aortic valve lesion, diabetes with neurovascular complications, COPD, hypothyroidism, and dementia. Most recent labs were reviewed and data entered into the assessment and plan and the problem list. She was seen in the ED 11/18/2020 after being found on the floor room by the staff approximately 5:30 am.  Patient is on Coumadin because of the aortic lesion and history of embolic CVAs.  Periorbital swelling and hematoma were present.  Extensive imaging revealed no acute abnormalities.  Review of systems: Dementia invalidated responses.  She denied having gone to the emergency room but made the comment that she had "walked past it".  She denied any fall or falls.  She denied any headache or other active symptoms.  Physical exam:  Pertinent or positive findings: She was ambulating in her room when I entered.  She has a slow, slightly shuffling gait.  Eyebrows are decreased laterally.  There is circumferential ecchymosis around the right orbit, greatest laterally.  Extraocular motion and field of vision are grossly intact.  She has an upper plate.  Lower teeth are coated.  Second heart sound is louder than the first.  Breath sounds are decreased.  Abdomen is protuberant.   Fusiform changes of the knees are noted.  Pedal pulses are decreased.  She is wearing a sock on the left but not the right.  She has an anti- wandering device @ the R ankle.  Strength to opposition is fair-good in all extremities.  General appearance: Adequately nourished; no acute distress, increased work of breathing is present.   Lymphatic: No lymphadenopathy about the head, neck, axilla. Eyes: No conjunctival inflammation or lid edema is present. There is no scleral icterus. Ears:  External ear exam shows no significant lesions or deformities.   Nose:  External nasal examination shows no deformity or inflammation. Nasal mucosa are pink and moist without lesions, exudates Neck:  No thyromegaly, masses, tenderness noted.    Heart:  Normal rate and regular rhythm without gallop, murmur, click, rub .  Lungs:  without wheezes, rhonchi, rales, rubs. Abdomen: Bowel sounds are normal. Abdomen is soft and nontender with no organomegaly, hernias, masses. GU: Deferred  Extremities:  No cyanosis, clubbing, edema  Neurologic exam :Balance, Rhomberg, finger to nose testing could not be completed due to clinical state Skin: Warm & dry w/o tenting. No significant lesions or rash.  See summary under each active problem in the Problem List with associated updated therapeutic plan

## 2020-11-22 NOTE — Assessment & Plan Note (Addendum)
Although GFR was 70.66 and creatinine 0.95 at the SNF on 9/30; creatinine was 1.04 and GFR 58 in the ED 1023.  This indicates CKD stage IIIa.  No nephrotoxic drugs identified on med list. BMET will be monitored.

## 2020-11-22 NOTE — Assessment & Plan Note (Signed)
Diabetes' control is excellent as manifested by an A1c of 6.9 percent on 9/30.

## 2020-11-22 NOTE — Assessment & Plan Note (Addendum)
See 11/22/2020 she was seen in the ED 10/23 after unwitnessed fall in the context of Coumadin therapy for an aortic valve lesion and history of embolic strokes.  She denied having been to the ED but stated that she had "walked past it".  She denied any falls.

## 2020-11-22 NOTE — Assessment & Plan Note (Signed)
This is resolved; current total protein is 7.0 and albumin 4.1.

## 2020-11-22 NOTE — Assessment & Plan Note (Signed)
10/26/2020 TSH therapeutic at 1.57.  No change indicated

## 2020-11-22 NOTE — Patient Instructions (Signed)
See assessment and plan under each diagnosis in the problem list and acutely for this visit 

## 2020-11-22 NOTE — Assessment & Plan Note (Signed)
Current renal function reveals stage II with creatinine of 0.95 and GFR of 70.66.  Avoid nephrotoxic drugs.

## 2020-11-23 ENCOUNTER — Encounter: Payer: Self-pay | Admitting: Internal Medicine

## 2020-12-03 ENCOUNTER — Emergency Department (HOSPITAL_COMMUNITY): Payer: Medicare Other

## 2020-12-03 ENCOUNTER — Other Ambulatory Visit: Payer: Self-pay

## 2020-12-03 ENCOUNTER — Emergency Department (HOSPITAL_COMMUNITY)
Admission: EM | Admit: 2020-12-03 | Discharge: 2020-12-03 | Disposition: A | Discharge status: Home / Self Care | Payer: Medicare Other | Attending: Emergency Medicine | Admitting: Emergency Medicine

## 2020-12-03 DIAGNOSIS — Z7901 Long term (current) use of anticoagulants: Secondary | ICD-10-CM | POA: Insufficient documentation

## 2020-12-03 DIAGNOSIS — E039 Hypothyroidism, unspecified: Secondary | ICD-10-CM | POA: Diagnosis not present

## 2020-12-03 DIAGNOSIS — E11319 Type 2 diabetes mellitus with unspecified diabetic retinopathy without macular edema: Secondary | ICD-10-CM | POA: Insufficient documentation

## 2020-12-03 DIAGNOSIS — G300 Alzheimer's disease with early onset: Secondary | ICD-10-CM | POA: Diagnosis not present

## 2020-12-03 DIAGNOSIS — Z87891 Personal history of nicotine dependence: Secondary | ICD-10-CM | POA: Diagnosis not present

## 2020-12-03 DIAGNOSIS — Z79899 Other long term (current) drug therapy: Secondary | ICD-10-CM | POA: Diagnosis not present

## 2020-12-03 DIAGNOSIS — N183 Chronic kidney disease, stage 3 unspecified: Secondary | ICD-10-CM | POA: Diagnosis not present

## 2020-12-03 DIAGNOSIS — W01198A Fall on same level from slipping, tripping and stumbling with subsequent striking against other object, initial encounter: Secondary | ICD-10-CM | POA: Insufficient documentation

## 2020-12-03 DIAGNOSIS — I129 Hypertensive chronic kidney disease with stage 1 through stage 4 chronic kidney disease, or unspecified chronic kidney disease: Secondary | ICD-10-CM | POA: Diagnosis not present

## 2020-12-03 DIAGNOSIS — W19XXXA Unspecified fall, initial encounter: Secondary | ICD-10-CM

## 2020-12-03 DIAGNOSIS — F02818 Dementia in other diseases classified elsewhere, unspecified severity, with other behavioral disturbance: Secondary | ICD-10-CM | POA: Insufficient documentation

## 2020-12-03 DIAGNOSIS — S0990XA Unspecified injury of head, initial encounter: Secondary | ICD-10-CM | POA: Diagnosis present

## 2020-12-03 DIAGNOSIS — J449 Chronic obstructive pulmonary disease, unspecified: Secondary | ICD-10-CM | POA: Insufficient documentation

## 2020-12-03 DIAGNOSIS — E1122 Type 2 diabetes mellitus with diabetic chronic kidney disease: Secondary | ICD-10-CM | POA: Diagnosis not present

## 2020-12-03 DIAGNOSIS — S00211A Abrasion of right eyelid and periocular area, initial encounter: Secondary | ICD-10-CM | POA: Diagnosis not present

## 2020-12-03 LAB — I-STAT CHEM 8, ED
BUN: 21 mg/dL (ref 8–23)
Calcium, Ion: 1.13 mmol/L — ABNORMAL LOW (ref 1.15–1.40)
Chloride: 107 mmol/L (ref 98–111)
Creatinine, Ser: 0.8 mg/dL (ref 0.44–1.00)
Glucose, Bld: 136 mg/dL — ABNORMAL HIGH (ref 70–99)
HCT: 39 % (ref 36.0–46.0)
Hemoglobin: 13.3 g/dL (ref 12.0–15.0)
Potassium: 3.7 mmol/L (ref 3.5–5.1)
Sodium: 141 mmol/L (ref 135–145)
TCO2: 23 mmol/L (ref 22–32)

## 2020-12-03 LAB — CBC WITH DIFFERENTIAL/PLATELET
Abs Immature Granulocytes: 0.04 10*3/uL (ref 0.00–0.07)
Basophils Absolute: 0 10*3/uL (ref 0.0–0.1)
Basophils Relative: 1 %
Eosinophils Absolute: 0.3 10*3/uL (ref 0.0–0.5)
Eosinophils Relative: 4 %
HCT: 40.9 % (ref 36.0–46.0)
Hemoglobin: 13.4 g/dL (ref 12.0–15.0)
Immature Granulocytes: 1 %
Lymphocytes Relative: 20 %
Lymphs Abs: 1.3 10*3/uL (ref 0.7–4.0)
MCH: 29.4 pg (ref 26.0–34.0)
MCHC: 32.8 g/dL (ref 30.0–36.0)
MCV: 89.7 fL (ref 80.0–100.0)
Monocytes Absolute: 0.8 10*3/uL (ref 0.1–1.0)
Monocytes Relative: 13 %
Neutro Abs: 4 10*3/uL (ref 1.7–7.7)
Neutrophils Relative %: 61 %
Platelets: 160 10*3/uL (ref 150–400)
RBC: 4.56 MIL/uL (ref 3.87–5.11)
RDW: 14.2 % (ref 11.5–15.5)
WBC: 6.4 10*3/uL (ref 4.0–10.5)
nRBC: 0 % (ref 0.0–0.2)

## 2020-12-03 LAB — BASIC METABOLIC PANEL
Anion gap: 9 (ref 5–15)
BUN: 11 mg/dL (ref 8–23)
CO2: 21 mmol/L — ABNORMAL LOW (ref 22–32)
Calcium: 9.1 mg/dL (ref 8.9–10.3)
Chloride: 109 mmol/L (ref 98–111)
Creatinine, Ser: 0.83 mg/dL (ref 0.44–1.00)
GFR, Estimated: >60 mL/min (ref >60)
Glucose, Bld: 132 mg/dL — ABNORMAL HIGH (ref 70–99)
Potassium: 3.8 mmol/L (ref 3.5–5.1)
Sodium: 139 mmol/L (ref 135–145)

## 2020-12-03 LAB — PROTIME-INR
INR: 1.9 — ABNORMAL HIGH (ref 0.8–1.2)
Prothrombin Time: 22.1 seconds — ABNORMAL HIGH (ref 11.4–15.2)

## 2020-12-03 NOTE — Discharge Instructions (Signed)
Call your primary care doctor or specialist as discussed in the next 2-3 days.   Return immediately back to the ER if:  Your symptoms worsen within the next 12-24 hours. You develop new symptoms such as new fevers, persistent vomiting, new pain, shortness of breath, or new weakness or numbness, or if you have any other concerns.  

## 2020-12-03 NOTE — ED Triage Notes (Signed)
Pt had a fall when getting out of bed at her facility, she is on warfarin, has a hematoma over right eye and small abrasion, she has c/o right hip pain, no obvious deformity, the facility denies LOC and reports she is A/O per baseline

## 2020-12-03 NOTE — Progress Notes (Signed)
Chaplain responded to Level 2 trauma. Chaplain waited outside pt door for x ray to be completed and to consult with RN to provide emotional support to pt and ensure no family is present. RN reports pt has dementia and would not benefit from spiritual care at this time as she is being transported to CT. No family present.  Chaplain support available 24/7. Please notify if further support is needed.  Please page as further needs arise.  Maryanna Shape. Carley Hammed, M.Div. [facility] Chaplain Pager [phone] Office [phone]       12/03/20 1305  Clinical Encounter Type  Visited With Health care provider  Visit Type Initial  Referral From Nurse

## 2020-12-03 NOTE — ED Provider Notes (Signed)
[facility] EMERGENCY DEPARTMENT Provider Note   CSN: [id number] Arrival date & time: 12/03/20  1254     History Chief Complaint  Patient presents with   [name] is a 70 y.o. female.  Patient presents to ER chief complaint of head injury.  She reportedly had a slip and fall and hit her head on nearby furniture.  No loss conscious reported.  No neck pain or back pain reported.  No other extremity pain reported.  Patient otherwise has a history of dementia.  Ambulatory without assistance at baseline per patient.      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    Chronic anticoagulation    Vertigo    AKI (acute kidney injury) ([place]) 06/28/2015   Protein-calorie malnutrition, moderate ([place]) 06/28/2015   History of cardioembolic cerebrovascular accident (CVA) 06/28/2015   Hypokalemia    Cerebellar stroke  ([place])    Emesis    Anxiety 06/27/2015   Dizziness 06/27/2015   Tobacco abuse    Early onset Alzheimer's disease with behavioral disturbance ([place]) 11/03/2014   Memory deficits 07/03/2014   Trigger finger, acquired 09/13/2012   Pain in both wrists 05/12/2012   Non-proliferative diabetic retinopathy, both eyes ([place]) 05/07/2012   Colon cancer screening 12/23/2011   Unintentional weight loss 12/22/2011   Idiopathic acute pancreatitis 10/29/2011   Major depressive disorder 09/10/2011   Hypertension 09/10/2011   Chronic back pain 09/10/2011   Hyperlipidemia 09/10/2011   Type II diabetes mellitus with neurological manifestations ([place]) 09/10/2011    Past Surgical History:  Procedure Laterality Date   BACK SURGERY     CHOLECYSTECTOMY  2003   CYST REMOVAL HAND Left 12/11/2014   Procedure: LEFT RING FINGER TENDON SHEATH CYST EXCISION;  Surgeon: Milly Jakob, MD;  Location: [facility];  Service: Orthopedics;  Laterality: Left;   SPINE SURGERY  2006   Dr. Glenna Fellows - L3-L4, L4-L5 laminotomy and foraminotomy   TEE WITHOUT CARDIOVERSION N/A 07/03/2015   Procedure: TRANSESOPHAGEAL ECHOCARDIOGRAM (TEE);  Surgeon: Sueanne Margarita, MD;  Location: [facility] ENDOSCOPY;  Service: Cardiovascular;  Laterality: N/A;     OB History   No obstetric history on file.     Family History  Problem Relation Age of Onset   Alcohol abuse Father    Cancer Mother  Social History   Tobacco Use   Smoking status: Former    Packs/day: 0.50    Types: Cigarettes    Start date: 07/01/2015   Smokeless tobacco: Never  Substance Use Topics   Alcohol use: No    Alcohol/week: 0.0 standard drinks   Drug use: No    Home Medications Prior to Admission medications   Medication Sig Start Date End Date Taking? Authorizing Provider  acetaminophen (TYLENOL) 325 MG tablet Take 650 mg by mouth every 6 (six) hours as needed.    [provider]  amLODipine (NORVASC) 10 MG tablet Take 10 mg by mouth  daily.    [provider]  atorvastatin (LIPITOR) 20 MG tablet Take 20 mg by mouth at bedtime.     [provider]  chlorhexidine (PERIDEX) 0.12 % solution Use as directed 5 mLs in the mouth or throat every evening.    [provider]  Cholecalciferol (VITAMIN D-3) 125 MCG (5000 UT) TABS Take 1 tablet by mouth daily.     [provider]  donepezil (ARICEPT) 5 MG tablet Take 5 mg by mouth at bedtime.    [provider]  levothyroxine (SYNTHROID, LEVOTHROID) 50 MCG tablet Take 50 mcg by mouth daily before breakfast.    [provider]  memantine (NAMENDA) 10 MG tablet Take 10 mg by mouth 2 (two) times daily.     [provider]  NON FORMULARY Regular CCD (Diet)    [provider]  Phenazopyridine HCl (URISTAT PO) Take 30 mLs by mouth in the morning and at bedtime.    [provider]  senna-docusate (SENOKOT-S) 8.6-50 MG tablet Take 1 tablet by mouth at bedtime. For Constipation    [provider]  sertraline (ZOLOFT) 100 MG tablet Take 100 mg by mouth daily.     [provider]  warfarin (COUMADIN) 7.5 MG tablet Take 7.5 mg by mouth daily.    [provider]    Allergies    Patient has no known allergies.  Review of Systems   Review of Systems  Unable to perform ROS: Dementia   Physical Exam Updated Vital Signs BP (!) 137/101   Pulse (!) 56   Temp 97.6 F (36.4 C) (Oral)   Resp (!) 31   Ht 5\' 5"  (1.651 m)   Wt 88 kg   SpO2 95%   BMI 32.28 kg/m   Physical Exam Constitutional:      General: She is not in acute distress.    Appearance: Normal appearance.  HENT:     Head: Normocephalic.     Comments: Mild abrasion and swelling to the right supraorbital region.  No bony crepitus or deformity or significant tenderness noted.    Nose: Nose normal.  Eyes:     Extraocular Movements: Extraocular movements intact.  Cardiovascular:     Rate and Rhythm: Normal rate.  Pulmonary:      Effort: Pulmonary effort is normal.  Musculoskeletal:        General: Normal range of motion.     Cervical back: Normal range of motion.  Neurological:     General: No focal deficit present.     Mental Status: She is alert. Mental status is at baseline.    ED Results / Procedures / Treatments   Labs (all labs ordered are listed, but only abnormal results are displayed) Labs Reviewed  BASIC METABOLIC PANEL - Abnormal; Notable for the following components:      Result Value  CO2 21 (*)    Glucose, Bld 132 (*)    All other components within normal limits  PROTIME-INR - Abnormal; Notable for the following components:   Prothrombin Time 22.1 (*)    INR 1.9 (*)    All other components within normal limits  I-STAT CHEM 8, ED - Abnormal; Notable for the following components:   Glucose, Bld 136 (*)    Calcium, Ion 1.13 (*)    All other components within normal limits  CBC WITH DIFFERENTIAL/PLATELET    EKG None  Radiology CT HEAD WO CONTRAST ( )  Result Date: 12/03/2020 CLINICAL DATA:  Fall, on warfarin.  Hematoma over right eye EXAM: CT HEAD WITHOUT CONTRAST TECHNIQUE: Contiguous axial images were obtained from the base of the skull through the vertex without intravenous contrast. COMPARISON:  CT head dated November 18, 2020 FINDINGS: Brain: No evidence of acute infarction, hemorrhage, hydrocephalus, extra-axial collection or mass lesion/mass effect. Encephalomalacia of the left occipital lobe, unchanged. Moderate cerebral atrophy and chronic small vessel ischemic changes of the white matter. Vascular: Atherosclerotic calcifications of bilateral carotid siphons and vertebral arteries Skull: No acute fracture Sinuses/Orbits: Mild left preseptal soft tissue swelling. Paranasal sinuses are clear. Other: None IMPRESSION: 1.  No acute fracture or significant soft tissue injury. 2.  No acute intracranial process. 3. Chronic left occipital lobe infarct, moderate cerebral atrophy and chronic  small vessel ischemic changes of the white matter, unchanged. Electronically Signed   By: Larose Hires D.O.   On: 12/03/2020 13:47   DG Chest Portable 1 View  Result Date: 12/03/2020 CLINICAL DATA:  Provided history: Fall. EXAM: PORTABLE CHEST 1 VIEW COMPARISON:  Prior chest radiographs 11/18/2020 and earlier. FINDINGS: Heart size within normal limits. Redemonstrated chronic prominence of the interstitial lung markings. No appreciable airspace consolidation. No evidence of pleural effusion or pneumothorax. No acute bony abnormality identified. Degenerative changes of the spine. IMPRESSION: No evidence of acute cardiopulmonary abnormality. Chronic prominence of the interstitial lung markings, unchanged. Electronically Signed   By: Jackey Loge D.O.   On: 12/03/2020 13:33   DG Hip Unilat W or Wo Pelvis 2-3 Views Right  Result Date: 12/03/2020 CLINICAL DATA:  Fall, pain EXAM: DG HIP (WITH OR WITHOUT PELVIS) 2-3V RIGHT COMPARISON:  None. FINDINGS: There is no evidence of hip fracture or dislocation. There is no evidence of arthropathy or other focal bone abnormality. Vascular calcifications. IMPRESSION: Negative. Electronically Signed   By: Tish Frederickson M.D.   On: 12/03/2020 15:05    Procedures Procedures   Medications Ordered in ED Medications - No data to display  ED Course  I have reviewed the triage vital signs and the nursing notes.  Pertinent labs & imaging results that were available during my care of the patient were reviewed by me and considered in my medical decision making (see chart for details).    MDM Rules/Calculators/A&P                           Patient was a level 2 trauma activation due to head injury on blood thinners.  Labs are unremarkable.  CT imaging unremarkable for acute fracture or intracranial hemorrhage.  Patient denies any pain appears comfortable, advised Tylenol as needed and follow-up with her doctor within the week.  Final Clinical Impression(s) / ED  Diagnoses Final diagnoses:  Fall, initial encounter  Injury of head, initial encounter    Rx / DC Orders ED Discharge Orders     None  Luna Fuse, MD 12/03/20 1525

## 2020-12-03 NOTE — ED Notes (Signed)
Patient transported to CT 

## 2020-12-03 NOTE — Progress Notes (Signed)
Orthopedic Tech Progress Note Patient Details:  [name] Jan 28, 1950 [id number]  Patient ID: [name], female   DOB: [birth year], 70 y.o.   MRN: [id number] Level II Fall; not needed at the moment.  Darleen Crocker 12/03/2020, 12:59 PM

## 2021-01-01 ENCOUNTER — Emergency Department (HOSPITAL_COMMUNITY): Payer: Medicare Other

## 2021-01-01 ENCOUNTER — Emergency Department (HOSPITAL_COMMUNITY)
Admission: EM | Admit: 2021-01-01 | Discharge: 2021-01-01 | Disposition: A | Discharge status: Home / Self Care | Payer: Medicare Other | Attending: Emergency Medicine | Admitting: Emergency Medicine

## 2021-01-01 ENCOUNTER — Encounter (HOSPITAL_COMMUNITY): Payer: Self-pay

## 2021-01-01 ENCOUNTER — Other Ambulatory Visit: Payer: Self-pay

## 2021-01-01 DIAGNOSIS — S0011XA Contusion of right eyelid and periocular area, initial encounter: Secondary | ICD-10-CM | POA: Insufficient documentation

## 2021-01-01 DIAGNOSIS — F0280 Dementia in other diseases classified elsewhere without behavioral disturbance: Secondary | ICD-10-CM | POA: Diagnosis not present

## 2021-01-01 DIAGNOSIS — Z79899 Other long term (current) drug therapy: Secondary | ICD-10-CM | POA: Insufficient documentation

## 2021-01-01 DIAGNOSIS — Z87891 Personal history of nicotine dependence: Secondary | ICD-10-CM | POA: Insufficient documentation

## 2021-01-01 DIAGNOSIS — Z7901 Long term (current) use of anticoagulants: Secondary | ICD-10-CM | POA: Insufficient documentation

## 2021-01-01 DIAGNOSIS — W06XXXA Fall from bed, initial encounter: Secondary | ICD-10-CM | POA: Diagnosis not present

## 2021-01-01 DIAGNOSIS — S0083XA Contusion of other part of head, initial encounter: Secondary | ICD-10-CM | POA: Insufficient documentation

## 2021-01-01 DIAGNOSIS — E039 Hypothyroidism, unspecified: Secondary | ICD-10-CM | POA: Diagnosis not present

## 2021-01-01 DIAGNOSIS — J449 Chronic obstructive pulmonary disease, unspecified: Secondary | ICD-10-CM | POA: Diagnosis not present

## 2021-01-01 DIAGNOSIS — G309 Alzheimer's disease, unspecified: Secondary | ICD-10-CM | POA: Diagnosis not present

## 2021-01-01 DIAGNOSIS — N183 Chronic kidney disease, stage 3 unspecified: Secondary | ICD-10-CM | POA: Insufficient documentation

## 2021-01-01 DIAGNOSIS — I129 Hypertensive chronic kidney disease with stage 1 through stage 4 chronic kidney disease, or unspecified chronic kidney disease: Secondary | ICD-10-CM | POA: Diagnosis not present

## 2021-01-01 DIAGNOSIS — E1122 Type 2 diabetes mellitus with diabetic chronic kidney disease: Secondary | ICD-10-CM | POA: Diagnosis not present

## 2021-01-01 DIAGNOSIS — W19XXXA Unspecified fall, initial encounter: Secondary | ICD-10-CM

## 2021-01-01 DIAGNOSIS — S0990XA Unspecified injury of head, initial encounter: Secondary | ICD-10-CM | POA: Diagnosis present

## 2021-01-01 LAB — CBC WITH DIFFERENTIAL/PLATELET
Abs Immature Granulocytes: 0.04 10*3/uL (ref 0.00–0.07)
Basophils Absolute: 0 10*3/uL (ref 0.0–0.1)
Basophils Relative: 1 %
Eosinophils Absolute: 0.2 10*3/uL (ref 0.0–0.5)
Eosinophils Relative: 3 %
HCT: 47.9 % — ABNORMAL HIGH (ref 36.0–46.0)
Hemoglobin: 15.3 g/dL — ABNORMAL HIGH (ref 12.0–15.0)
Immature Granulocytes: 1 %
Lymphocytes Relative: 30 %
Lymphs Abs: 1.8 10*3/uL (ref 0.7–4.0)
MCH: 29.4 pg (ref 26.0–34.0)
MCHC: 31.9 g/dL (ref 30.0–36.0)
MCV: 91.9 fL (ref 80.0–100.0)
Monocytes Absolute: 0.7 10*3/uL (ref 0.1–1.0)
Monocytes Relative: 12 %
Neutro Abs: 3.2 10*3/uL (ref 1.7–7.7)
Neutrophils Relative %: 53 %
Platelets: 156 10*3/uL (ref 150–400)
RBC: 5.21 MIL/uL — ABNORMAL HIGH (ref 3.87–5.11)
RDW: 13.9 % (ref 11.5–15.5)
WBC: 6 10*3/uL (ref 4.0–10.5)
nRBC: 0 % (ref 0.0–0.2)

## 2021-01-01 LAB — BASIC METABOLIC PANEL
Anion gap: 8 (ref 5–15)
BUN: 24 mg/dL — ABNORMAL HIGH (ref 8–23)
CO2: 23 mmol/L (ref 22–32)
Calcium: 9.6 mg/dL (ref 8.9–10.3)
Chloride: 105 mmol/L (ref 98–111)
Creatinine, Ser: 0.98 mg/dL (ref 0.44–1.00)
GFR, Estimated: >60 mL/min (ref >60)
Glucose, Bld: 158 mg/dL — ABNORMAL HIGH (ref 70–99)
Potassium: 4.1 mmol/L (ref 3.5–5.1)
Sodium: 136 mmol/L (ref 135–145)

## 2021-01-01 LAB — PROTIME-INR
INR: 2.4 — ABNORMAL HIGH (ref 0.8–1.2)
Prothrombin Time: 26.4 seconds — ABNORMAL HIGH (ref 11.4–15.2)

## 2021-01-01 NOTE — ED Notes (Signed)
Patient transported to CT 

## 2021-01-01 NOTE — ED Notes (Signed)
Patient transported to X-ray 

## 2021-01-01 NOTE — ED Provider Notes (Addendum)
[place] EMERGENCY DEPARTMENT Provider Note   CSN: [id number] Arrival date & time:        History Chief Complaint  Patient presents with   [place] from sitting on side of bed to floor. Hit head and has hematoma and swelling to right eye. Pt on blood thinners    [name] is a 70 y.o. female.  The history is provided by the patient, the EMS personnel and medical records.  [name] is a 70 y.o. female who presents to the Emergency Department complaining of fall.  Level V caveat due to confusion.  Hx is provided by EMS.  She is currently at [facility] and was sitting at the edge of the bed and fell forward, striking her head on the floor.  When staff arrived she was sitting on the floor next to the bed.  She is on warfarin, nonambulatory at baseline.  Baseline oriented to self.  No reports of recent illnesses, but has experienced four falls in the last month.    Additional hx from [facility] staff - pt fell out of bed, no recent illnesses but she does have trouble following commands and frequently sits at the side of the bed and falls asleep.      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    Chronic anticoagulation    Vertigo    AKI (acute kidney injury) ([place]) 06/28/2015   Protein-calorie malnutrition, moderate ([place]) 06/28/2015   History of cardioembolic cerebrovascular accident (CVA) 06/28/2015   Hypokalemia    Cerebellar stroke ([place])    Emesis    Anxiety 06/27/2015   Dizziness 06/27/2015   Tobacco abuse    Early onset Alzheimer's disease with behavioral disturbance ([place]) 11/03/2014   Memory deficits 07/03/2014   Trigger finger, acquired 09/13/2012   Pain in both wrists 05/12/2012   Non-proliferative diabetic retinopathy, both eyes ([place]) 05/07/2012   Colon cancer screening 12/23/2011   Unintentional weight loss 12/22/2011   Idiopathic acute pancreatitis 10/29/2011   Major depressive disorder 09/10/2011   Hypertension 09/10/2011   Chronic back pain 09/10/2011   Hyperlipidemia 09/10/2011   Type II diabetes mellitus with neurological manifestations ([place]) 09/10/2011    Past Surgical History:  Procedure Laterality Date   BACK SURGERY     CHOLECYSTECTOMY  2003   CYST  REMOVAL HAND Left 12/11/2014   Procedure: LEFT RING FINGER TENDON SHEATH CYST EXCISION;  Surgeon: Mack Hook, MD;  Location: [facility];  Service: Orthopedics;  Laterality: Left;   SPINE SURGERY  2006   Dr. Trey Sailors - L3-L4, L4-L5 laminotomy and foraminotomy   TEE WITHOUT CARDIOVERSION N/A 07/03/2015   Procedure: TRANSESOPHAGEAL ECHOCARDIOGRAM (TEE);  Surgeon: Quintella Reichert, MD;  Location: [facility];  Service: Cardiovascular;  Laterality: N/A;     OB History   No obstetric history on file.     Family History  Problem Relation Age of Onset   Alcohol abuse Father    Cancer Mother      Social History   Tobacco Use   Smoking status: Former    Packs/day: 0.50    Types: Cigarettes    Start date: 07/01/2015   Smokeless tobacco: Never  Substance Use Topics   Alcohol use: No    Alcohol/week: 0.0 standard drinks   Drug use: No    Home Medications Prior to Admission medications   Medication Sig Start Date End Date Taking? Authorizing Provider  acetaminophen (TYLENOL) 325 MG tablet Take 650 mg by mouth every 6 (six) hours as needed.    [provider]  amLODipine (NORVASC) 10 MG tablet Take 10 mg by mouth daily.    [provider]  atorvastatin (LIPITOR) 20 MG tablet Take 20 mg by mouth at bedtime.     [provider]  chlorhexidine (PERIDEX) 0.12 % solution Use as directed 5 mLs in the mouth or throat every evening.    [provider]  Cholecalciferol (VITAMIN D-3) 125 MCG (5000 UT) TABS Take 1 tablet by mouth daily.     [provider]  donepezil (ARICEPT) 5 MG tablet Take 5 mg by mouth at bedtime.    [provider]  levothyroxine (SYNTHROID, LEVOTHROID) 50 MCG tablet Take 50 mcg by mouth daily before breakfast.    [provider]  memantine (NAMENDA) 10 MG tablet Take 10 mg by mouth 2 (two) times daily.     [provider]  NON FORMULARY Regular CCD (Diet)    [provider]  Phenazopyridine HCl (URISTAT PO) Take 30 mLs by mouth in the morning and at bedtime.    [provider]  senna-docusate (SENOKOT-S) 8.6-50 MG tablet Take 1 tablet by mouth at bedtime. For Constipation    [provider]  sertraline (ZOLOFT) 100 MG tablet Take 100 mg by mouth daily.     [provider]  warfarin (COUMADIN) 7.5 MG tablet Take 7.5 mg by mouth daily.    [provider]    Allergies    Patient has no known allergies.  Review of Systems   Review of Systems  All other systems reviewed and are negative.  Physical Exam Updated Vital Signs BP (!) 153/104   Pulse  (!) 113   Temp (!) 97.5 F (36.4 C) (Oral)   Resp (!) 4   SpO2 100%   Physical Exam Vitals and nursing note reviewed.  Constitutional:      Appearance: She is well-developed.  HENT:     Head: Normocephalic.     Comments: Hematoma to right eyebrow.  EOMI Cardiovascular:     Rate and Rhythm: Normal rate and regular rhythm.     Heart sounds: No murmur heard. Pulmonary:     Effort: Pulmonary effort is normal. No respiratory distress.     Breath sounds: Normal breath sounds.  Abdominal:  Palpations: Abdomen is soft.     Tenderness: There is no abdominal tenderness. There is no guarding or rebound.  Musculoskeletal:     Comments: Mild TTP over right hip.  Able to range the hips bilaterally.    Skin:    General: Skin is warm and dry.  Neurological:     Mental Status: She is alert.     Comments: Oriented to person and "hospital" disoriented to time and recent events.  4+/5 strength in all four extremities.  Confused, slow to answer questions.   Psychiatric:        Behavior: Behavior normal.    ED Results / Procedures / Treatments   Labs (all labs ordered are listed, but only abnormal results are displayed) Labs Reviewed  BASIC METABOLIC PANEL - Abnormal; Notable for the following components:      Result Value   Glucose, Bld 158 (*)    BUN 24 (*)    All other components within normal limits  CBC WITH DIFFERENTIAL/PLATELET - Abnormal; Notable for the following components:   RBC 5.21 (*)    Hemoglobin 15.3 (*)    HCT 47.9 (*)    All other components within normal limits  PROTIME-INR - Abnormal; Notable for the following components:   Prothrombin Time 26.4 (*)    INR 2.4 (*)    All other components within normal limits    EKG EKG Interpretation  Date/Time:  Tuesday January 01 2021 03:32:40 EST Ventricular Rate:  52 PR Interval:  128 QRS Duration: 126 QT Interval:  472 QTC Calculation: 439 R Axis:   -44 Text Interpretation: Sinus rhythm RBBB and LAFB Probable  left ventricular hypertrophy Confirmed by Quintella Reichert [phone]) on 01/01/2021 4:11:46 AM  Radiology CT Head Wo Contrast  Result Date: 01/01/2021 CLINICAL DATA:  Head trauma, minor (Age >= 65y); Neck trauma (Age >= 65y) EXAM: CT HEAD WITHOUT CONTRAST CT CERVICAL SPINE WITHOUT CONTRAST TECHNIQUE: Multidetector CT imaging of the head and cervical spine was performed following the standard protocol without intravenous contrast. Multiplanar CT image reconstructions of the cervical spine were also generated. COMPARISON:  11/18/2020 FINDINGS: CT HEAD FINDINGS Brain: Normal anatomic configuration. Parenchymal volume loss is commensurate with the patient's age. Mild periventricular white matter changes are present likely reflecting the sequela of small vessel ischemia. Left occipital encephalomalacia related to left PCA territory cortical infarct is again noted. Tiny lacunar infarcts are noted within the cerebellum bilaterally no abnormal intra or extra-axial mass lesion or fluid collection. No abnormal mass effect or midline shift. No evidence of acute intracranial hemorrhage or infarct. Ventricular size is normal. Cerebellum unremarkable. Vascular: No asymmetric hyperdense vasculature at the skull base. Skull: Intact Sinuses/Orbits: Paranasal sinuses are clear. Orbits are unremarkable. Moderate right preseptal and supraorbital soft tissue swelling. Other: Mastoid air cells and middle ear cavities are clear. CT CERVICAL SPINE FINDINGS Alignment: Overall straightening.  No listhesis. Skull base and vertebrae: Craniocervical alignment is normal. The atlantodental interval is not widened. No acute fracture of the cervical spine. Soft tissues and spinal canal: No prevertebral fluid or swelling. No visible canal hematoma. Disc levels: There is intervertebral disc space narrowing and endplate remodeling throughout the cervical spine, most severe at C5-C7 in keeping with changes of mild to moderate degenerative disc disease.  The prevertebral soft tissues are not thickened on sagittal reformats. The spinal canal is widely patent. Review of the axial images demonstrates multilevel uncovertebral and facet arthrosis resulting in multilevel mild neuroforaminal narrowing most severe on the right at C4-5  and bilaterally at C5-6. Upper chest: Unremarkable Other: None IMPRESSION: No acute intracranial injury. No calvarial fracture. Moderate right preseptal and supraorbital soft tissue swelling. No acute fracture or listhesis of the cervical spine. Electronically Signed   By: Fidela Salisbury M.D.   On: 01/01/2021 04:31   CT Cervical Spine Wo Contrast  Result Date: 01/01/2021 CLINICAL DATA:  Head trauma, minor (Age >= 65y); Neck trauma (Age >= 65y) EXAM: CT HEAD WITHOUT CONTRAST CT CERVICAL SPINE WITHOUT CONTRAST TECHNIQUE: Multidetector CT imaging of the head and cervical spine was performed following the standard protocol without intravenous contrast. Multiplanar CT image reconstructions of the cervical spine were also generated. COMPARISON:  11/18/2020 FINDINGS: CT HEAD FINDINGS Brain: Normal anatomic configuration. Parenchymal volume loss is commensurate with the patient's age. Mild periventricular white matter changes are present likely reflecting the sequela of small vessel ischemia. Left occipital encephalomalacia related to left PCA territory cortical infarct is again noted. Tiny lacunar infarcts are noted within the cerebellum bilaterally no abnormal intra or extra-axial mass lesion or fluid collection. No abnormal mass effect or midline shift. No evidence of acute intracranial hemorrhage or infarct. Ventricular size is normal. Cerebellum unremarkable. Vascular: No asymmetric hyperdense vasculature at the skull base. Skull: Intact Sinuses/Orbits: Paranasal sinuses are clear. Orbits are unremarkable. Moderate right preseptal and supraorbital soft tissue swelling. Other: Mastoid air cells and middle ear cavities are clear. CT CERVICAL  SPINE FINDINGS Alignment: Overall straightening.  No listhesis. Skull base and vertebrae: Craniocervical alignment is normal. The atlantodental interval is not widened. No acute fracture of the cervical spine. Soft tissues and spinal canal: No prevertebral fluid or swelling. No visible canal hematoma. Disc levels: There is intervertebral disc space narrowing and endplate remodeling throughout the cervical spine, most severe at C5-C7 in keeping with changes of mild to moderate degenerative disc disease. The prevertebral soft tissues are not thickened on sagittal reformats. The spinal canal is widely patent. Review of the axial images demonstrates multilevel uncovertebral and facet arthrosis resulting in multilevel mild neuroforaminal narrowing most severe on the right at C4-5 and bilaterally at C5-6. Upper chest: Unremarkable Other: None IMPRESSION: No acute intracranial injury. No calvarial fracture. Moderate right preseptal and supraorbital soft tissue swelling. No acute fracture or listhesis of the cervical spine. Electronically Signed   By: Fidela Salisbury M.D.   On: 01/01/2021 04:31   DG Chest Port 1 View  Result Date: 01/01/2021 CLINICAL DATA:  Fall, trauma. EXAM: PORTABLE CHEST 1 VIEW COMPARISON:  12/03/2020. FINDINGS: The heart is mildly enlarged. Atherosclerotic calcification of the aorta is noted. Interstitial prominence is present at the lung bases and unchanged from prior exams. No consolidation, effusion, or pneumothorax. An old rib fracture is noted on the left. IMPRESSION: 1. Cardiomegaly. 2. Stable interstitial changes at the lung bases. Electronically Signed   By: Brett Fairy M.D.   On: 01/01/2021 03:53   DG Hip Unilat W or Wo Pelvis 2-3 Views Right  Result Date: 01/01/2021 CLINICAL DATA:  Status post fall EXAM: DG HIP (WITH OR WITHOUT PELVIS) 2-3V RIGHT COMPARISON:  12/03/2020 FINDINGS: There is no evidence of hip fracture or dislocation. There is no evidence of arthropathy or other focal  bone abnormality. IMPRESSION: Negative. Electronically Signed   By: Kerby Moors M.D.   On: 01/01/2021 05:46    Procedures Procedures   Medications Ordered in ED Medications - No data to display  ED Course  I have reviewed the triage vital signs and the nursing notes.  Pertinent labs & imaging  results that were available during my care of the patient were reviewed by me and considered in my medical decision making (see chart for details).    MDM Rules/Calculators/A&P                          Pt here as a Level II trauma alert following fall with head injury on blood thinners.  Pt has a hematoma to her right eyebrow on exam. INR therapeutic at 2.4.  CT head and cervical spine are negative for acute abnormality.  Pt had mild ttp over right hip but able to range the hip.  No fracture on plain films.  Plan to d/c back to facility with outpatient follow up and return precautions.    Was able to reach out and contact her facility. Was unable to contact her guardian listed on the facility face sheet.  Final Clinical Impression(s) / ED Diagnoses Final diagnoses:  Fall, initial encounter  Contusion of face, initial encounter    Rx / DC Orders ED Discharge Orders     None        Quintella Reichert, MD 01/01/21 [id number]    Quintella Reichert, MD 01/01/21 [phone]

## 2021-01-01 NOTE — ED Notes (Signed)
PT[facility] called  

## 2021-01-01 NOTE — ED Notes (Signed)
Report called to Adrina at [facility]. Pt resting quietly and is in NAD. Denies pain

## 2021-01-01 NOTE — Discharge Instructions (Addendum)
[name]'s CT scans showed no new injuries.  Her INR was 2.4 today.  Please have her follow up with her family doctor for recheck due to multiple falls.

## 2021-01-01 NOTE — ED Notes (Signed)
This RN called Pietro Cassis, the pt's legal guardian. No answer to both numbers. This RN left a voicemail. [facility] is at bedside to take pt back to [facility]. All paperwork given to PTAR and pt's belongings. Sonny Dandy has received report from nightshift and is aware pt is coming back.

## 2021-01-01 NOTE — ED Notes (Signed)
Call received from NP at [facility] facility to give report on pt. States pt's last INR was 2 on 11/14. Also stated that pt's guardian has been made aware

## 2021-01-01 NOTE — Progress Notes (Signed)
Orthopedic Tech Progress Note Patient Details:  [name] 03/15/1950 [id number] Level 2 trauma Patient ID: [name], female   DOB: 02-06-1950, 70 y.o.   MRN: [id number]  [name] 01/01/2021, 3:27 AM
